# Patient Record
Sex: Female | Born: 1945 | Race: White | Hispanic: No | Smoking: Former smoker
Health system: Southern US, Community
[De-identification: ages and names within clinical notes are randomized; demographics above are authoritative.]

## PROBLEM LIST (undated history)

## (undated) DIAGNOSIS — J449 Chronic obstructive pulmonary disease, unspecified: Secondary | ICD-10-CM

## (undated) DIAGNOSIS — J189 Pneumonia, unspecified organism: Secondary | ICD-10-CM

## (undated) DIAGNOSIS — R3911 Hesitancy of micturition: Secondary | ICD-10-CM

## (undated) DIAGNOSIS — Z9981 Dependence on supplemental oxygen: Secondary | ICD-10-CM

## (undated) DIAGNOSIS — E7800 Pure hypercholesterolemia, unspecified: Secondary | ICD-10-CM

## (undated) HISTORY — PX: JOINT REPLACEMENT: SHX530

---

## 1999-01-14 ENCOUNTER — Encounter: Admission: RE | Admit: 1999-01-14 | Discharge: 1999-01-14 | Payer: Self-pay | Admitting: Cardiovascular Disease

## 1999-01-14 ENCOUNTER — Encounter: Payer: Self-pay | Admitting: Cardiovascular Disease

## 2000-10-18 ENCOUNTER — Encounter: Admission: RE | Admit: 2000-10-18 | Discharge: 2000-10-18 | Payer: Self-pay | Admitting: Cardiovascular Disease

## 2000-10-18 ENCOUNTER — Encounter: Payer: Self-pay | Admitting: Cardiovascular Disease

## 2000-11-14 ENCOUNTER — Other Ambulatory Visit: Admission: RE | Admit: 2000-11-14 | Discharge: 2000-11-14 | Payer: Self-pay | Admitting: Obstetrics and Gynecology

## 2003-09-17 ENCOUNTER — Encounter: Admission: RE | Admit: 2003-09-17 | Discharge: 2003-09-17 | Payer: Self-pay | Admitting: Cardiovascular Disease

## 2006-08-08 HISTORY — PX: CARDIAC CATHETERIZATION: SHX172

## 2006-08-25 ENCOUNTER — Ambulatory Visit (HOSPITAL_COMMUNITY): Admission: RE | Admit: 2006-08-25 | Discharge: 2006-08-25 | Payer: Self-pay | Admitting: Cardiovascular Disease

## 2009-02-27 ENCOUNTER — Encounter: Admission: RE | Admit: 2009-02-27 | Discharge: 2009-02-27 | Payer: Self-pay | Admitting: Cardiovascular Disease

## 2010-06-22 NOTE — Cardiovascular Report (Signed)
[name], Caitlin Vasquez                ACCOUNT NO.:  [id number]   MEDICAL RECORD NO.:  [id number]          PATIENT TYPE:  OIB   LOCATION:  2899                         FACILITY:  [facility]   PHYSICIAN:  Ricki Rodriguez, M.D.  DATE OF BIRTH:  05/11/1945   DATE OF PROCEDURE:  08/25/2006  DATE OF DISCHARGE:  08/25/2006                            CARDIAC CATHETERIZATION   Left heart catheterization, selective coronary angiography.   INDICATION:  This 65 year old white female with atypical chest pain,  cardiac risk factors and abnormal stress test.   APPROACH:  The right femoral artery using 4-French sheath and catheters.   COMPLICATIONS:  None.   HEMODYNAMIC DATA:  The aortic pressure was 127/62.   CORONARY ANATOMY:  1. The left main coronary artery was unremarkable.  2. Left anterior descending coronary artery:  The left anterior      descending coronary artery showed proximal 20%, mid vessel 20-30%      stenosis.  3. Diagonal vessel:  The diagonal vessels had ostial 20-30% and then      luminal irregularities of the proximal half of the vessel.  4. Left circumflex coronary artery:  The left circumflex coronary      artery also had luminal irregularities, had 30% proximal to mid      junction eccentric lesion and mid vessel 30-40% eccentric lesion.      The distal vessel was unremarkable.  5. Obtuse marginal branch one:  The obtuse marginal branch one had      luminal irregularities.  Obtuse marginal branch two and there were      very small vessels, and obtuse marginal branch four had mild      disease.  6. Right coronary artery:  The right coronary artery was codominant      with the left circumflex coronary artery and had 30% proximal, 40%      mid vessel, and 50% mid to distal junction eccentric lesion.  The      posterolateral branch and posterior descending coronary artery were      unremarkable.   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  She will get  left ventricular ejection fraction checked by echocardiogram, which was  near normal on her last echocardiogram.      Ricki Rodriguez, M.D.  Electronically Signed     ASK/MEDQ  D:  08/25/2006  T:  08/25/2006  Job:  [id number]

## 2010-06-23 ENCOUNTER — Ambulatory Visit
Admission: RE | Admit: 2010-06-23 | Discharge: 2010-06-23 | Disposition: A | Discharge status: Home / Self Care | Payer: Medicare Other | Source: Ambulatory Visit | Attending: Cardiovascular Disease | Admitting: Cardiovascular Disease

## 2010-06-23 ENCOUNTER — Other Ambulatory Visit: Payer: Self-pay | Admitting: Cardiovascular Disease

## 2010-06-23 DIAGNOSIS — R52 Pain, unspecified: Secondary | ICD-10-CM

## 2010-06-23 DIAGNOSIS — R609 Edema, unspecified: Secondary | ICD-10-CM

## 2011-05-17 ENCOUNTER — Other Ambulatory Visit: Payer: Self-pay | Admitting: Cardiovascular Disease

## 2011-05-17 ENCOUNTER — Ambulatory Visit
Admission: RE | Admit: 2011-05-17 | Discharge: 2011-05-17 | Disposition: A | Discharge status: Home / Self Care | Payer: Medicare Other | Source: Ambulatory Visit | Attending: Cardiovascular Disease | Admitting: Cardiovascular Disease

## 2011-05-17 DIAGNOSIS — R059 Cough, unspecified: Secondary | ICD-10-CM

## 2011-05-17 DIAGNOSIS — R05 Cough: Secondary | ICD-10-CM

## 2014-11-27 ENCOUNTER — Other Ambulatory Visit: Payer: Self-pay | Admitting: Cardiovascular Disease

## 2014-11-28 ENCOUNTER — Ambulatory Visit (HOSPITAL_COMMUNITY)
Admission: RE | Admit: 2014-11-28 | Discharge: 2014-11-28 | Disposition: A | Discharge status: Home / Self Care | Payer: Medicare Other | Source: Ambulatory Visit | Attending: Cardiovascular Disease | Admitting: Cardiovascular Disease

## 2014-11-28 ENCOUNTER — Encounter (HOSPITAL_COMMUNITY)
Admission: RE | Disposition: A | Discharge status: Home / Self Care | Payer: Self-pay | Source: Ambulatory Visit | Attending: Cardiovascular Disease

## 2014-11-28 DIAGNOSIS — I701 Atherosclerosis of renal artery: Secondary | ICD-10-CM | POA: Insufficient documentation

## 2014-11-28 DIAGNOSIS — Z8249 Family history of ischemic heart disease and other diseases of the circulatory system: Secondary | ICD-10-CM | POA: Insufficient documentation

## 2014-11-28 DIAGNOSIS — I509 Heart failure, unspecified: Secondary | ICD-10-CM | POA: Diagnosis not present

## 2014-11-28 DIAGNOSIS — I10 Essential (primary) hypertension: Secondary | ICD-10-CM | POA: Diagnosis present

## 2014-11-28 DIAGNOSIS — E785 Hyperlipidemia, unspecified: Secondary | ICD-10-CM | POA: Insufficient documentation

## 2014-11-28 DIAGNOSIS — R0600 Dyspnea, unspecified: Secondary | ICD-10-CM | POA: Diagnosis present

## 2014-11-28 DIAGNOSIS — R7303 Prediabetes: Secondary | ICD-10-CM | POA: Insufficient documentation

## 2014-11-28 DIAGNOSIS — R079 Chest pain, unspecified: Secondary | ICD-10-CM | POA: Diagnosis present

## 2014-11-28 DIAGNOSIS — J449 Chronic obstructive pulmonary disease, unspecified: Secondary | ICD-10-CM | POA: Insufficient documentation

## 2014-11-28 DIAGNOSIS — I25110 Atherosclerotic heart disease of native coronary artery with unstable angina pectoris: Secondary | ICD-10-CM | POA: Diagnosis not present

## 2014-11-28 DIAGNOSIS — Z7982 Long term (current) use of aspirin: Secondary | ICD-10-CM | POA: Diagnosis not present

## 2014-11-28 DIAGNOSIS — I272 Other secondary pulmonary hypertension: Secondary | ICD-10-CM | POA: Diagnosis not present

## 2014-11-28 DIAGNOSIS — F17210 Nicotine dependence, cigarettes, uncomplicated: Secondary | ICD-10-CM | POA: Diagnosis not present

## 2014-11-28 DIAGNOSIS — R0609 Other forms of dyspnea: Secondary | ICD-10-CM | POA: Diagnosis present

## 2014-11-28 DIAGNOSIS — J4489 Other specified chronic obstructive pulmonary disease: Secondary | ICD-10-CM | POA: Diagnosis present

## 2014-11-28 DIAGNOSIS — I110 Hypertensive heart disease with heart failure: Secondary | ICD-10-CM | POA: Diagnosis not present

## 2014-11-28 HISTORY — PX: CARDIAC CATHETERIZATION: SHX172

## 2014-11-28 LAB — POCT I-STAT 3, VENOUS BLOOD GAS (G3P V)
Acid-base deficit: 1 mmol/L (ref 0.0–2.0)
Bicarbonate: 23.9 mEq/L (ref 20.0–24.0)
O2 Saturation: 69 %
TCO2: 25 mmol/L (ref 0–100)
pCO2, Ven: 38.6 mmHg — ABNORMAL LOW (ref 45.0–50.0)
pH, Ven: 7.399 — ABNORMAL HIGH (ref 7.250–7.300)
pO2, Ven: 36 mmHg (ref 30.0–45.0)

## 2014-11-28 LAB — BASIC METABOLIC PANEL
Anion gap: 11 (ref 5–15)
BUN: 9 mg/dL (ref 6–20)
CO2: 24 mmol/L (ref 22–32)
Calcium: 8.8 mg/dL — ABNORMAL LOW (ref 8.9–10.3)
Chloride: 96 mmol/L — ABNORMAL LOW (ref 101–111)
Creatinine, Ser: 0.89 mg/dL (ref 0.44–1.00)
GFR calc Af Amer: >60 mL/min (ref >60)
GFR calc non Af Amer: >60 mL/min (ref >60)
Glucose, Bld: 113 mg/dL — ABNORMAL HIGH (ref 65–99)
Potassium: 4 mmol/L (ref 3.5–5.1)
Sodium: 131 mmol/L — ABNORMAL LOW (ref 135–145)

## 2014-11-28 LAB — POCT I-STAT 3, ART BLOOD GAS (G3+)
Acid-base deficit: 2 mmol/L (ref 0.0–2.0)
Bicarbonate: 22.4 mEq/L (ref 20.0–24.0)
O2 Saturation: 94 %
TCO2: 23 mmol/L (ref 0–100)
pCO2 arterial: 35 mmHg (ref 35.0–45.0)
pH, Arterial: 7.415 (ref 7.350–7.450)
pO2, Arterial: 69 mmHg — ABNORMAL LOW (ref 80.0–100.0)

## 2014-11-28 LAB — PROTIME-INR
INR: 1.03 (ref 0.00–1.49)
Prothrombin Time: 13.7 seconds (ref 11.6–15.2)

## 2014-11-28 LAB — CBC
HCT: 40 % (ref 36.0–46.0)
Hemoglobin: 13.9 g/dL (ref 12.0–15.0)
MCH: 30.2 pg (ref 26.0–34.0)
MCHC: 34.8 g/dL (ref 30.0–36.0)
MCV: 86.8 fL (ref 78.0–100.0)
Platelets: 217 10*3/uL (ref 150–400)
RBC: 4.61 MIL/uL (ref 3.87–5.11)
RDW: 13.5 % (ref 11.5–15.5)
WBC: 7.9 10*3/uL (ref 4.0–10.5)

## 2014-11-28 SURGERY — RIGHT/LEFT HEART CATH AND CORONARY ANGIOGRAPHY
Anesthesia: LOCAL

## 2014-11-28 MED ORDER — SODIUM CHLORIDE 0.9 % IV SOLN
INTRAVENOUS | Status: DC
  Administered 2014-11-28: 08:00:00 via INTRAVENOUS

## 2014-11-28 MED ORDER — SODIUM CHLORIDE 0.9 % IJ SOLN: 3.0000 mL | Freq: Two times a day (BID) | INTRAMUSCULAR | Status: DC

## 2014-11-28 MED ORDER — HYDROCODONE-ACETAMINOPHEN 5-325 MG PO TABS
ORAL_TABLET | ORAL | Status: AC
  Filled 2014-11-28: qty 1

## 2014-11-28 MED ORDER — MIDAZOLAM HCL 2 MG/2ML IJ SOLN
INTRAMUSCULAR | Status: AC
  Filled 2014-11-28: qty 4

## 2014-11-28 MED ORDER — ALBUTEROL SULFATE HFA 108 (90 BASE) MCG/ACT IN AERS: 2.0000 | INHALATION_SPRAY | Freq: Four times a day (QID) | RESPIRATORY_TRACT | Status: DC | PRN

## 2014-11-28 MED ORDER — FENTANYL CITRATE (PF) 100 MCG/2ML IJ SOLN
INTRAMUSCULAR | Status: DC | PRN
  Administered 2014-11-28: 25 ug via INTRAVENOUS

## 2014-11-28 MED ORDER — FENTANYL CITRATE (PF) 100 MCG/2ML IJ SOLN
INTRAMUSCULAR | Status: AC
  Filled 2014-11-28: qty 4

## 2014-11-28 MED ORDER — HYDROCODONE-ACETAMINOPHEN 10-325 MG PO TABS
1.0000 | ORAL_TABLET | Freq: Three times a day (TID) | ORAL | Status: DC | PRN
  Administered 2014-11-28: 1 via ORAL

## 2014-11-28 MED ORDER — IOHEXOL 350 MG/ML SOLN
INTRAVENOUS | Status: DC | PRN
  Administered 2014-11-28: 90 mL via INTRA_ARTERIAL

## 2014-11-28 MED ORDER — IRBESARTAN 150 MG PO TABS: 150.0000 mg | ORAL_TABLET | Freq: Every day | ORAL | Status: DC

## 2014-11-28 MED ORDER — SODIUM CHLORIDE 0.9 % IV SOLN: INTRAVENOUS | Status: AC

## 2014-11-28 MED ORDER — CARVEDILOL 25 MG PO TABS: 25.0000 mg | ORAL_TABLET | Freq: Two times a day (BID) | ORAL | Status: DC

## 2014-11-28 MED ORDER — SPIRONOLACTONE 12.5 MG HALF TABLET: 12.5000 mg | ORAL_TABLET | Freq: Every day | ORAL | Status: DC

## 2014-11-28 MED ORDER — TIOTROPIUM BROMIDE-OLODATEROL 2.5-2.5 MCG/ACT IN AERS: 1.0000 | INHALATION_SPRAY | Freq: Two times a day (BID) | RESPIRATORY_TRACT | Status: DC

## 2014-11-28 MED ORDER — SODIUM CHLORIDE 0.9 % IJ SOLN: 3.0000 mL | INTRAMUSCULAR | Status: DC | PRN

## 2014-11-28 MED ORDER — HYDRALAZINE HCL 20 MG/ML IJ SOLN
20.0000 mg | Freq: Once | INTRAMUSCULAR | Status: DC | PRN
  Administered 2014-11-28: 20 mg via INTRAVENOUS

## 2014-11-28 MED ORDER — ATORVASTATIN CALCIUM 20 MG PO TABS: 20.0000 mg | ORAL_TABLET | ORAL | Status: DC

## 2014-11-28 MED ORDER — HEPARIN (PORCINE) IN NACL 2-0.9 UNIT/ML-% IJ SOLN
INTRAMUSCULAR | Status: DC | PRN
  Administered 2014-11-28: 10:00:00

## 2014-11-28 MED ORDER — IPRATROPIUM BROMIDE 0.02 % IN SOLN: 0.5000 mg | Freq: Three times a day (TID) | RESPIRATORY_TRACT | Status: DC

## 2014-11-28 MED ORDER — LIDOCAINE HCL (PF) 1 % IJ SOLN
INTRAMUSCULAR | Status: DC | PRN
  Administered 2014-11-28: 20 mL

## 2014-11-28 MED ORDER — MIDAZOLAM HCL 2 MG/2ML IJ SOLN
INTRAMUSCULAR | Status: DC | PRN
  Administered 2014-11-28: 1 mg via INTRAVENOUS

## 2014-11-28 MED ORDER — HEPARIN (PORCINE) IN NACL 2-0.9 UNIT/ML-% IJ SOLN
INTRAMUSCULAR | Status: AC
  Filled 2014-11-28: qty 1000

## 2014-11-28 MED ORDER — CLONIDINE HCL 0.2 MG PO TABS: 0.2000 mg | ORAL_TABLET | Freq: Two times a day (BID) | ORAL | Status: DC

## 2014-11-28 MED ORDER — NITROGLYCERIN 1 MG/10 ML FOR IR/CATH LAB
INTRA_ARTERIAL | Status: AC
  Filled 2014-11-28: qty 10

## 2014-11-28 MED ORDER — SODIUM CHLORIDE 0.9 % IV SOLN: 250.0000 mL | INTRAVENOUS | Status: DC | PRN

## 2014-11-28 MED ORDER — AMLODIPINE BESYLATE 10 MG PO TABS: 10.0000 mg | ORAL_TABLET | Freq: Every day | ORAL | Status: DC

## 2014-11-28 MED ORDER — HYDRALAZINE HCL 20 MG/ML IJ SOLN
INTRAMUSCULAR | Status: AC
  Filled 2014-11-28: qty 1

## 2014-11-28 MED ORDER — LIDOCAINE HCL (PF) 1 % IJ SOLN
INTRAMUSCULAR | Status: AC
  Filled 2014-11-28: qty 30

## 2014-11-28 MED ORDER — ONDANSETRON HCL 4 MG/2ML IJ SOLN: 4.0000 mg | Freq: Four times a day (QID) | INTRAMUSCULAR | Status: DC | PRN

## 2014-11-28 MED ORDER — ACETAMINOPHEN 325 MG PO TABS: 650.0000 mg | ORAL_TABLET | ORAL | Status: DC | PRN

## 2014-11-28 SURGICAL SUPPLY — 12 items
CATH INFINITI 5FR MULTPACK ANG (CATHETERS) ×2 IMPLANT
CATH SWAN GANZ 7F STRAIGHT (CATHETERS) ×2 IMPLANT
KIT HEART LEFT (KITS) ×2 IMPLANT
KIT HEART RIGHT NAMIC (KITS) ×2 IMPLANT
PACK CARDIAC CATHETERIZATION (CUSTOM PROCEDURE TRAY) ×2 IMPLANT
SHEATH PINNACLE 5F 10CM (SHEATH) ×2 IMPLANT
SHEATH PINNACLE 7F 10CM (SHEATH) ×2 IMPLANT
SYR MEDRAD MARK V 150ML (SYRINGE) ×2 IMPLANT
TRANSDUCER W/STOPCOCK (MISCELLANEOUS) ×4 IMPLANT
WIRE EMERALD 3MM-J .035X150CM (WIRE) ×2 IMPLANT
WIRE EMERALD 3MM-J .035X260CM (WIRE) ×1 IMPLANT
WIRE HI TORQ VERSACORE-J 145CM (WIRE) ×1 IMPLANT

## 2014-11-28 NOTE — Discharge Instructions (Signed)
Angiogram, Care After °Refer to this sheet in the next few weeks. These instructions provide you with information about caring for yourself after your procedure. Your health care provider may also give you more specific instructions. Your treatment has been planned according to current medical practices, but problems sometimes occur. Call your health care provider if you have any problems or questions after your procedure. °WHAT TO EXPECT AFTER THE PROCEDURE °After your procedure, it is typical to have the following: °· Bruising at the catheter insertion site that usually fades within 1-2 weeks. °· Blood collecting in the tissue (hematoma) that may be painful to the touch. It should usually decrease in size and tenderness within 1-2 weeks. °HOME CARE INSTRUCTIONS °· Take medicines only as directed by your health care provider. °· You may shower 24-48 hours after the procedure or as directed by your health care provider. Remove the bandage (dressing) and gently wash the site with plain soap and water. Pat the area dry with a clean towel. Do not rub the site, because this may cause bleeding. °· Do not take baths, swim, or use a hot tub until your health care provider approves. °· Check your insertion site every day for redness, swelling, or drainage. °· Do not apply powder or lotion to the site. °· Do not lift over 10 lb (4.5 kg) for 5 days after your procedure or as directed by your health care provider. °· Ask your health care provider when it is okay to: °¨ Return to work or school. °¨ Resume usual physical activities or sports. °¨ Resume sexual activity. °· Do not drive home if you are discharged the same day as the procedure. Have someone else drive you. °· You may drive 24 hours after the procedure unless otherwise instructed by your health care provider. °· Do not operate machinery or power tools for 24 hours after the procedure or as directed by your health care provider. °· If your procedure was done as an  outpatient procedure, which means that you went home the same day as your procedure, a responsible adult should be with you for the first 24 hours after you arrive home. °· Keep all follow-up visits as directed by your health care provider. This is important. °SEEK MEDICAL CARE IF: °· You have a fever. °· You have chills. °· You have increased bleeding from the catheter insertion site. Hold pressure on the site. °SEEK IMMEDIATE MEDICAL CARE IF: °· You have unusual pain at the catheter insertion site. °· You have redness, warmth, or swelling at the catheter insertion site. °· You have drainage (other than a small amount of blood on the dressing) from the catheter insertion site. °· The catheter insertion site is bleeding, and the bleeding does not stop after 30 minutes of holding steady pressure on the site. °· The area near or just beyond the catheter insertion site becomes pale, cool, tingly, or numb. °  °This information is not intended to replace advice given to you by your health care provider. Make sure you discuss any questions you have with your health care provider. °  °Document Released: 08/12/2004 Document Revised: 02/14/2014 Document Reviewed: 06/27/2012 °Elsevier Interactive Patient Education ©2016 [employer] ° °

## 2014-11-28 NOTE — Progress Notes (Addendum)
5 and 7 fr sheaths removed from rt groin and hemostasis achieved after 20 minutes direct pressure.  Site level 0 and clear dsg applied.  Distal pulses palpatable and pt teaching with voice back given. [facility]  BP stable after Hydralazine 20mg  IV given.  Additional pressure held for 15 mins and a pressure dressing applied. Pt given Hydrocodone 1 tab PO for leg cramps.

## 2014-11-28 NOTE — H&P (Signed)
Referring Physician:  LAYANA KONKEL is an 69 y.o. female.                       Chief Complaint: Recent history of heart failure with COPD and exertional dyspnea.  HPI: 69 year old female with PMH of hypertension, smoking, dyslipidemia, CHF, CAD has increasing exertional dyspnea and occasional chest pain.  Past medical history: Borderline DM, II, + Hypertension, Smoking 1/4 th pack of cig/day x 1 yr and 1 pk/day x 43 year, dyslipidemia, no FH of premature CAD.   Past surgical history: C-sections-39 years ago. C. Cath Jun 23, 2007- mild CAD.   Family history : Mom died of old age of 1 and dad died age 66, had MI. One sister with anemia age 57. One brother died 3 weeks ago from lung condition.  Social History:  +ve tobacco use 45 + pack year, no alcohol, and drug history.  Personal: Widowed since Jun 23, 2011, husband died of pulmonary fibrosis and lung infection post knee operation.   Allergies: Lisinopril and Tribenzor.  Medications Prior to Admission  Medication Sig Dispense Refill  . albuterol (PROVENTIL HFA;VENTOLIN HFA) 108 (90 BASE) MCG/ACT inhaler Inhale 2 puffs into the lungs every 6 (six) hours as needed for wheezing or shortness of breath.    Marland Kitchen amLODipine (NORVASC) 10 MG tablet Take 10 mg by mouth daily.    Marland Kitchen aspirin EC 81 MG tablet Take 81 mg by mouth daily.    Marland Kitchen atorvastatin (LIPITOR) 20 MG tablet Take 20 mg by mouth 3 (three) times a week. On Monday, Wednesday and Friday.    . carvedilol (COREG) 25 MG tablet Take 25 mg by mouth 2 (two) times daily with a meal.    . cloNIDine (CATAPRES) 0.2 MG tablet Take 0.2 mg by mouth 2 (two) times daily.    Marland Kitchen HYDROcodone-acetaminophen (NORCO) 10-325 MG tablet Take 1 tablet by mouth every 8 (eight) hours as needed for moderate pain.    Marland Kitchen ipratropium (ATROVENT) 0.02 % nebulizer solution Take 0.5 mg by nebulization 3 (three) times daily.    Marland Kitchen spironolactone (ALDACTONE) 25 MG tablet Take 12.5 mg by mouth daily.    . Tiotropium Bromide-Olodaterol (STIOLTO  RESPIMAT) 2.5-2.5 MCG/ACT AERS Inhale 1 puff into the lungs 2 (two) times daily.    . valsartan (DIOVAN) 160 MG tablet Take 160 mg by mouth daily.      No results found for this or any previous visit (from the past 48 hour(s)). No results found.  Review Of Systems The patient denies fever, vision loss, decreased hearing, hoarseness, hemoptysis, abdominal pain, melena, hematochezia, severe indigestion/heartburn, hematuria, incontinence, genital sores, suspicious skin lesions, transient blindness, abnormal bleeding, enlarged lymph nodes, angioedema, and breast masses. She has anorexia, weight loss, chest pain, arhtritis, hypertension and mild chronic ankle edema  Blood pressure 152/64, pulse 64, temperature 97.4 F (36.3 C), temperature source Oral, resp. rate 18, height  (1.626 m), weight 51.823 kg (114 lb 4 oz), SpO2 93 %.  HEENT: Normocephalic. atrumatic. Mucous membranes pink; Hazel eyes, PERRLA; EOM intact; No scleral icterus, Nares: Patent, Oropharynx: Clear, Edentulous or Fair Dentition, Neck: FROM, no cervical lymphadenopathy nor thyromegaly or carotid bruit; no JVD; CHEST WALL: No tenderness CHEST: Clear to auscultation bilaterally with mild respiratory distress. HEART: Regular rate and rhythm; no rubs or gallops. II/VI systolic murmur. BACK: No kyphosis or scoliosis; no CVA tenderness ABDOMEN: Positive Bowel Sounds, Scaphoid, Obese, soft non-tender; no masses, no organomegaly, no pannus; no intertriginous candida. EXTREMITIES:  No bone or joint deformity; age-appropriate arthropathy of the hands and knees; no cyanosis, clubbing or edema; no ulcerations.   PULSES: 2+ and symmetric SKIN: Normal hydration no rash or ulceration CNS: Cranial nerves 2-12 grossly intact no focal neurologic deficit  Assessment/Plan Exertional dyspnea COPD Hypertension Chest pain  Right and left heart catheterization Patient understood procedure, alternatives and risks.  Ricki RodriguezKADAKIA,Jaslyne Beeck S,  MD  11/28/2014, 7:40 AM

## 2014-12-01 ENCOUNTER — Encounter (HOSPITAL_COMMUNITY): Payer: Self-pay | Admitting: Cardiovascular Disease

## 2016-01-28 DIAGNOSIS — S72002A Fracture of unspecified part of neck of left femur, initial encounter for closed fracture: Secondary | ICD-10-CM

## 2016-01-29 DIAGNOSIS — I10 Essential (primary) hypertension: Secondary | ICD-10-CM

## 2016-01-29 DIAGNOSIS — I5032 Chronic diastolic (congestive) heart failure: Secondary | ICD-10-CM

## 2016-01-29 DIAGNOSIS — I272 Pulmonary hypertension, unspecified: Secondary | ICD-10-CM

## 2016-01-29 DIAGNOSIS — J439 Emphysema, unspecified: Secondary | ICD-10-CM

## 2016-01-29 DIAGNOSIS — W19XXXA Unspecified fall, initial encounter: Secondary | ICD-10-CM

## 2016-01-29 DIAGNOSIS — E559 Vitamin D deficiency, unspecified: Secondary | ICD-10-CM

## 2016-01-29 DIAGNOSIS — E7800 Pure hypercholesterolemia, unspecified: Secondary | ICD-10-CM

## 2016-01-29 DIAGNOSIS — D62 Acute posthemorrhagic anemia: Secondary | ICD-10-CM

## 2016-01-29 DIAGNOSIS — S72002A Fracture of unspecified part of neck of left femur, initial encounter for closed fracture: Secondary | ICD-10-CM

## 2016-01-29 HISTORY — PX: TOTAL HIP ARTHROPLASTY: SHX124

## 2016-01-30 DIAGNOSIS — W19XXXA Unspecified fall, initial encounter: Secondary | ICD-10-CM | POA: Diagnosis not present

## 2016-01-30 DIAGNOSIS — I5032 Chronic diastolic (congestive) heart failure: Secondary | ICD-10-CM | POA: Diagnosis not present

## 2016-01-30 DIAGNOSIS — I272 Pulmonary hypertension, unspecified: Secondary | ICD-10-CM | POA: Diagnosis not present

## 2016-01-30 DIAGNOSIS — S72002A Fracture of unspecified part of neck of left femur, initial encounter for closed fracture: Secondary | ICD-10-CM | POA: Diagnosis not present

## 2016-01-31 DIAGNOSIS — S72002A Fracture of unspecified part of neck of left femur, initial encounter for closed fracture: Secondary | ICD-10-CM | POA: Diagnosis not present

## 2016-01-31 DIAGNOSIS — I5032 Chronic diastolic (congestive) heart failure: Secondary | ICD-10-CM | POA: Diagnosis not present

## 2016-01-31 DIAGNOSIS — I272 Pulmonary hypertension, unspecified: Secondary | ICD-10-CM | POA: Diagnosis not present

## 2016-01-31 DIAGNOSIS — W19XXXA Unspecified fall, initial encounter: Secondary | ICD-10-CM | POA: Diagnosis not present

## 2016-02-01 DIAGNOSIS — S72002A Fracture of unspecified part of neck of left femur, initial encounter for closed fracture: Secondary | ICD-10-CM | POA: Diagnosis not present

## 2016-02-01 DIAGNOSIS — I272 Pulmonary hypertension, unspecified: Secondary | ICD-10-CM | POA: Diagnosis not present

## 2016-02-01 DIAGNOSIS — I5032 Chronic diastolic (congestive) heart failure: Secondary | ICD-10-CM | POA: Diagnosis not present

## 2016-02-01 DIAGNOSIS — W19XXXA Unspecified fall, initial encounter: Secondary | ICD-10-CM | POA: Diagnosis not present

## 2016-02-02 DIAGNOSIS — I272 Pulmonary hypertension, unspecified: Secondary | ICD-10-CM | POA: Diagnosis not present

## 2016-02-02 DIAGNOSIS — I5032 Chronic diastolic (congestive) heart failure: Secondary | ICD-10-CM | POA: Diagnosis not present

## 2016-02-02 DIAGNOSIS — S72002A Fracture of unspecified part of neck of left femur, initial encounter for closed fracture: Secondary | ICD-10-CM | POA: Diagnosis not present

## 2016-02-02 DIAGNOSIS — W19XXXA Unspecified fall, initial encounter: Secondary | ICD-10-CM | POA: Diagnosis not present

## 2016-04-06 ENCOUNTER — Other Ambulatory Visit: Payer: Self-pay

## 2016-04-06 ENCOUNTER — Inpatient Hospital Stay (HOSPITAL_COMMUNITY): Payer: Medicare Other

## 2016-04-06 ENCOUNTER — Encounter (HOSPITAL_COMMUNITY): Payer: Self-pay | Admitting: General Practice

## 2016-04-06 ENCOUNTER — Inpatient Hospital Stay (HOSPITAL_COMMUNITY)
Admission: AD | Admit: 2016-04-06 | Discharge: 2016-04-15 | DRG: 252 | Disposition: A | Discharge status: Home / Self Care | Payer: Medicare Other | Source: Ambulatory Visit | Attending: Cardiovascular Disease | Admitting: Cardiovascular Disease

## 2016-04-06 DIAGNOSIS — I70202 Unspecified atherosclerosis of native arteries of extremities, left leg: Secondary | ICD-10-CM | POA: Diagnosis present

## 2016-04-06 DIAGNOSIS — L97529 Non-pressure chronic ulcer of other part of left foot with unspecified severity: Secondary | ICD-10-CM | POA: Diagnosis present

## 2016-04-06 DIAGNOSIS — R590 Localized enlarged lymph nodes: Secondary | ICD-10-CM | POA: Diagnosis present

## 2016-04-06 DIAGNOSIS — I70244 Atherosclerosis of native arteries of left leg with ulceration of heel and midfoot: Secondary | ICD-10-CM | POA: Diagnosis not present

## 2016-04-06 DIAGNOSIS — I110 Hypertensive heart disease with heart failure: Secondary | ICD-10-CM | POA: Diagnosis present

## 2016-04-06 DIAGNOSIS — I5031 Acute diastolic (congestive) heart failure: Secondary | ICD-10-CM | POA: Diagnosis present

## 2016-04-06 DIAGNOSIS — Z7982 Long term (current) use of aspirin: Secondary | ICD-10-CM

## 2016-04-06 DIAGNOSIS — E1151 Type 2 diabetes mellitus with diabetic peripheral angiopathy without gangrene: Secondary | ICD-10-CM | POA: Diagnosis present

## 2016-04-06 DIAGNOSIS — Z9981 Dependence on supplemental oxygen: Secondary | ICD-10-CM | POA: Diagnosis not present

## 2016-04-06 DIAGNOSIS — I998 Other disorder of circulatory system: Secondary | ICD-10-CM | POA: Diagnosis present

## 2016-04-06 DIAGNOSIS — I2510 Atherosclerotic heart disease of native coronary artery without angina pectoris: Secondary | ICD-10-CM | POA: Diagnosis present

## 2016-04-06 DIAGNOSIS — R918 Other nonspecific abnormal finding of lung field: Secondary | ICD-10-CM | POA: Diagnosis present

## 2016-04-06 DIAGNOSIS — I714 Abdominal aortic aneurysm, without rupture, unspecified: Secondary | ICD-10-CM

## 2016-04-06 DIAGNOSIS — E876 Hypokalemia: Secondary | ICD-10-CM | POA: Diagnosis present

## 2016-04-06 DIAGNOSIS — I5021 Acute systolic (congestive) heart failure: Secondary | ICD-10-CM | POA: Diagnosis present

## 2016-04-06 DIAGNOSIS — D62 Acute posthemorrhagic anemia: Secondary | ICD-10-CM | POA: Diagnosis not present

## 2016-04-06 DIAGNOSIS — E43 Unspecified severe protein-calorie malnutrition: Secondary | ICD-10-CM | POA: Diagnosis present

## 2016-04-06 DIAGNOSIS — E11621 Type 2 diabetes mellitus with foot ulcer: Secondary | ICD-10-CM | POA: Diagnosis present

## 2016-04-06 DIAGNOSIS — Z681 Body mass index (BMI) 19 or less, adult: Secondary | ICD-10-CM

## 2016-04-06 DIAGNOSIS — R0609 Other forms of dyspnea: Secondary | ICD-10-CM

## 2016-04-06 DIAGNOSIS — Z96642 Presence of left artificial hip joint: Secondary | ICD-10-CM | POA: Diagnosis present

## 2016-04-06 DIAGNOSIS — I708 Atherosclerosis of other arteries: Secondary | ICD-10-CM | POA: Diagnosis present

## 2016-04-06 DIAGNOSIS — L97429 Non-pressure chronic ulcer of left heel and midfoot with unspecified severity: Secondary | ICD-10-CM | POA: Diagnosis present

## 2016-04-06 DIAGNOSIS — M21372 Foot drop, left foot: Secondary | ICD-10-CM | POA: Diagnosis present

## 2016-04-06 DIAGNOSIS — Z888 Allergy status to other drugs, medicaments and biological substances status: Secondary | ICD-10-CM

## 2016-04-06 DIAGNOSIS — R627 Adult failure to thrive: Secondary | ICD-10-CM | POA: Diagnosis present

## 2016-04-06 DIAGNOSIS — E440 Moderate protein-calorie malnutrition: Secondary | ICD-10-CM | POA: Insufficient documentation

## 2016-04-06 DIAGNOSIS — I6523 Occlusion and stenosis of bilateral carotid arteries: Secondary | ICD-10-CM | POA: Diagnosis present

## 2016-04-06 DIAGNOSIS — R0602 Shortness of breath: Secondary | ICD-10-CM

## 2016-04-06 DIAGNOSIS — E785 Hyperlipidemia, unspecified: Secondary | ICD-10-CM | POA: Diagnosis present

## 2016-04-06 DIAGNOSIS — I10 Essential (primary) hypertension: Secondary | ICD-10-CM | POA: Diagnosis present

## 2016-04-06 DIAGNOSIS — Z8249 Family history of ischemic heart disease and other diseases of the circulatory system: Secondary | ICD-10-CM

## 2016-04-06 DIAGNOSIS — F17290 Nicotine dependence, other tobacco product, uncomplicated: Secondary | ICD-10-CM | POA: Diagnosis present

## 2016-04-06 DIAGNOSIS — J8410 Pulmonary fibrosis, unspecified: Secondary | ICD-10-CM | POA: Diagnosis present

## 2016-04-06 DIAGNOSIS — R0989 Other specified symptoms and signs involving the circulatory and respiratory systems: Secondary | ICD-10-CM | POA: Diagnosis not present

## 2016-04-06 DIAGNOSIS — M79609 Pain in unspecified limb: Secondary | ICD-10-CM | POA: Diagnosis not present

## 2016-04-06 DIAGNOSIS — Z79899 Other long term (current) drug therapy: Secondary | ICD-10-CM

## 2016-04-06 DIAGNOSIS — I70229 Atherosclerosis of native arteries of extremities with rest pain, unspecified extremity: Secondary | ICD-10-CM

## 2016-04-06 DIAGNOSIS — R0600 Dyspnea, unspecified: Secondary | ICD-10-CM | POA: Diagnosis present

## 2016-04-06 DIAGNOSIS — Z9889 Other specified postprocedural states: Secondary | ICD-10-CM | POA: Diagnosis not present

## 2016-04-06 DIAGNOSIS — J449 Chronic obstructive pulmonary disease, unspecified: Secondary | ICD-10-CM | POA: Diagnosis present

## 2016-04-06 DIAGNOSIS — I509 Heart failure, unspecified: Secondary | ICD-10-CM

## 2016-04-06 HISTORY — DX: Chronic obstructive pulmonary disease, unspecified: J44.9

## 2016-04-06 HISTORY — DX: Pure hypercholesterolemia, unspecified: E78.00

## 2016-04-06 HISTORY — DX: Pneumonia, unspecified organism: J18.9

## 2016-04-06 HISTORY — DX: Hesitancy of micturition: R39.11

## 2016-04-06 HISTORY — DX: Dependence on supplemental oxygen: Z99.81

## 2016-04-06 LAB — CBC WITH DIFFERENTIAL/PLATELET
Basophils Absolute: 0 10*3/uL (ref 0.0–0.1)
Basophils Relative: 0 %
Eosinophils Absolute: 0.6 10*3/uL (ref 0.0–0.7)
Eosinophils Relative: 7 %
HCT: 35.4 % — ABNORMAL LOW (ref 36.0–46.0)
Hemoglobin: 11.4 g/dL — ABNORMAL LOW (ref 12.0–15.0)
Lymphocytes Relative: 18 %
Lymphs Abs: 1.5 10*3/uL (ref 0.7–4.0)
MCH: 26.5 pg (ref 26.0–34.0)
MCHC: 32.2 g/dL (ref 30.0–36.0)
MCV: 82.1 fL (ref 78.0–100.0)
Monocytes Absolute: 0.5 10*3/uL (ref 0.1–1.0)
Monocytes Relative: 6 %
Neutro Abs: 5.9 10*3/uL (ref 1.7–7.7)
Neutrophils Relative %: 69 %
Platelets: 349 10*3/uL (ref 150–400)
RBC: 4.31 MIL/uL (ref 3.87–5.11)
RDW: 13.7 % (ref 11.5–15.5)
WBC: 8.5 10*3/uL (ref 4.0–10.5)

## 2016-04-06 LAB — COMPREHENSIVE METABOLIC PANEL
ALT: 17 U/L (ref 14–54)
AST: 21 U/L (ref 15–41)
Albumin: 4.1 g/dL (ref 3.5–5.0)
Alkaline Phosphatase: 86 U/L (ref 38–126)
Anion gap: 10 (ref 5–15)
BUN: 9 mg/dL (ref 6–20)
CO2: 29 mmol/L (ref 22–32)
Calcium: 9.5 mg/dL (ref 8.9–10.3)
Chloride: 96 mmol/L — ABNORMAL LOW (ref 101–111)
Creatinine, Ser: 0.71 mg/dL (ref 0.44–1.00)
GFR calc Af Amer: >60 mL/min (ref >60)
GFR calc non Af Amer: >60 mL/min (ref >60)
Glucose, Bld: 119 mg/dL — ABNORMAL HIGH (ref 65–99)
Potassium: 3.6 mmol/L (ref 3.5–5.1)
Sodium: 135 mmol/L (ref 135–145)
Total Bilirubin: 0.4 mg/dL (ref 0.3–1.2)
Total Protein: 7.7 g/dL (ref 6.5–8.1)

## 2016-04-06 LAB — D-DIMER, QUANTITATIVE: D-Dimer, Quant: 1.86 ug/mL-FEU — ABNORMAL HIGH (ref 0.00–0.50)

## 2016-04-06 LAB — TROPONIN I
Troponin I: <0.03 ng/mL (ref <0.03)
Troponin I: <0.03 ng/mL (ref <0.03)

## 2016-04-06 LAB — BRAIN NATRIURETIC PEPTIDE: B Natriuretic Peptide: 51.1 pg/mL (ref 0.0–100.0)

## 2016-04-06 MED ORDER — CARVEDILOL 12.5 MG PO TABS
12.5000 mg | ORAL_TABLET | Freq: Two times a day (BID) | ORAL | Status: DC
  Administered 2016-04-06 – 2016-04-15 (×17): 12.5 mg via ORAL
  Filled 2016-04-06 (×17): qty 1

## 2016-04-06 MED ORDER — ASPIRIN EC 81 MG PO TBEC
81.0000 mg | DELAYED_RELEASE_TABLET | Freq: Every day | ORAL | Status: DC
  Administered 2016-04-06 – 2016-04-15 (×9): 81 mg via ORAL
  Filled 2016-04-06 (×9): qty 1

## 2016-04-06 MED ORDER — ATORVASTATIN CALCIUM 20 MG PO TABS
20.0000 mg | ORAL_TABLET | ORAL | Status: DC
  Administered 2016-04-06 – 2016-04-13 (×4): 20 mg via ORAL
  Filled 2016-04-06 (×3): qty 1

## 2016-04-06 MED ORDER — ONDANSETRON HCL 4 MG/2ML IJ SOLN
4.0000 mg | Freq: Four times a day (QID) | INTRAMUSCULAR | Status: DC | PRN
Start: 2016-04-06 — End: 2016-04-15
  Administered 2016-04-15: 4 mg via INTRAVENOUS
  Filled 2016-04-06: qty 2

## 2016-04-06 MED ORDER — SODIUM CHLORIDE 0.9 % IV SOLN: 250.0000 mL | INTRAVENOUS | Status: DC | PRN

## 2016-04-06 MED ORDER — LEVALBUTEROL HCL 1.25 MG/0.5ML IN NEBU: 1.2500 mg | INHALATION_SOLUTION | Freq: Four times a day (QID) | RESPIRATORY_TRACT | Status: DC | PRN

## 2016-04-06 MED ORDER — SODIUM CHLORIDE 0.9% FLUSH
3.0000 mL | Freq: Two times a day (BID) | INTRAVENOUS | Status: DC
  Administered 2016-04-06 – 2016-04-11 (×10): 3 mL via INTRAVENOUS

## 2016-04-06 MED ORDER — COLLAGENASE 250 UNIT/GM EX OINT
TOPICAL_OINTMENT | Freq: Every day | CUTANEOUS | Status: DC
  Administered 2016-04-06 – 2016-04-15 (×9): via TOPICAL
  Filled 2016-04-06 (×3): qty 30

## 2016-04-06 MED ORDER — CARVEDILOL 25 MG PO TABS: 25.0000 mg | ORAL_TABLET | Freq: Two times a day (BID) | ORAL | Status: DC

## 2016-04-06 MED ORDER — LEVALBUTEROL HCL 1.25 MG/0.5ML IN NEBU
1.2500 mg | INHALATION_SOLUTION | Freq: Three times a day (TID) | RESPIRATORY_TRACT | Status: DC
  Filled 2016-04-06: qty 0.5

## 2016-04-06 MED ORDER — ALBUTEROL SULFATE (2.5 MG/3ML) 0.083% IN NEBU: 3.0000 mL | INHALATION_SOLUTION | Freq: Four times a day (QID) | RESPIRATORY_TRACT | Status: DC | PRN

## 2016-04-06 MED ORDER — AMLODIPINE BESYLATE 10 MG PO TABS
10.0000 mg | ORAL_TABLET | Freq: Every day | ORAL | Status: DC
  Administered 2016-04-07 – 2016-04-15 (×8): 10 mg via ORAL
  Filled 2016-04-06 (×8): qty 1

## 2016-04-06 MED ORDER — CLONIDINE HCL 0.2 MG PO TABS: 0.2000 mg | ORAL_TABLET | Freq: Two times a day (BID) | ORAL | Status: DC

## 2016-04-06 MED ORDER — ENSURE ENLIVE PO LIQD
237.0000 mL | Freq: Two times a day (BID) | ORAL | Status: DC
  Administered 2016-04-06 – 2016-04-07 (×2): 237 mL via ORAL

## 2016-04-06 MED ORDER — HEPARIN SODIUM (PORCINE) 5000 UNIT/ML IJ SOLN
5000.0000 [IU] | Freq: Three times a day (TID) | INTRAMUSCULAR | Status: DC
  Administered 2016-04-06 – 2016-04-10 (×10): 5000 [IU] via SUBCUTANEOUS
  Filled 2016-04-06 (×11): qty 1

## 2016-04-06 MED ORDER — FUROSEMIDE 10 MG/ML IJ SOLN
40.0000 mg | Freq: Two times a day (BID) | INTRAMUSCULAR | Status: DC
  Administered 2016-04-06 – 2016-04-07 (×2): 40 mg via INTRAVENOUS
  Filled 2016-04-06 (×2): qty 4

## 2016-04-06 MED ORDER — ACETAMINOPHEN 325 MG PO TABS
650.0000 mg | ORAL_TABLET | ORAL | Status: DC | PRN
  Administered 2016-04-06 – 2016-04-11 (×5): 650 mg via ORAL
  Filled 2016-04-06 (×4): qty 2

## 2016-04-06 MED ORDER — SODIUM CHLORIDE 0.9% FLUSH: 3.0000 mL | INTRAVENOUS | Status: DC | PRN

## 2016-04-06 MED ORDER — SPIRONOLACTONE 25 MG PO TABS
12.5000 mg | ORAL_TABLET | Freq: Every day | ORAL | Status: DC
  Administered 2016-04-07 – 2016-04-15 (×8): 12.5 mg via ORAL
  Filled 2016-04-06 (×8): qty 1

## 2016-04-06 NOTE — H&P (Signed)
Referring Physician:  [name] is an 71 y.o. female.                       Chief Complaint: Leg swelling and shortness of breath  HPI: 71 year old female with PMH of hypertension, smoking, dyslipidemia, CHF and CAD has shortness of breath at rest with leg edema and discomfort in left calf area. No fever. Positive chronic cough. She also has noticed new ulcer over left heel area.  Past medical history : DM, II, Hypertension, smoking, dyslipidemia.    Past Surgical History:  Procedure Laterality Date  . CARDIAC CATHETERIZATION N/A 11/28/2014   Procedure: Right/Left Heart Cath and Coronary Angiography;  Surgeon: [name], MD;  Location: [facility] INVASIVE CV LAB;  Service: Cardiovascular;  Laterality: N/A;    Family history: Mom died of old age of 33 years and dad died of MI age 52 yrs. She has one sister, age 79 years and had brother died 2 years ago.  Social History:  has 45 to 50 pack year smoking. No alcohol or drug use.   Personal: Widowed, husband died of pulmonary fibrosis and lung infection post knee operation and Xarelto use.  Allergies: Lisinopril and Tribenzor  Medications Prior to Admission  Medication Sig Dispense Refill  . albuterol (PROVENTIL HFA;VENTOLIN HFA) 108 (90 BASE) MCG/ACT inhaler Inhale 2 puffs into the lungs every 6 (six) hours as needed for wheezing or shortness of breath.    Marland Kitchen amLODipine (NORVASC) 10 MG tablet Take 10 mg by mouth daily.    Marland Kitchen aspirin EC 81 MG tablet Take 81 mg by mouth daily.    Marland Kitchen atorvastatin (LIPITOR) 20 MG tablet Take 20 mg by mouth 3 (three) times a week. On Monday, Wednesday and Friday.    . carvedilol (COREG) 25 MG tablet Take 25 mg by mouth 2 (two) times daily with a meal.    . cloNIDine (CATAPRES) 0.2 MG tablet Take 0.2 mg by mouth 2 (two) times daily.    Marland Kitchen HYDROcodone-acetaminophen (NORCO) 10-325 MG tablet Take 1 tablet by mouth every 8 (eight) hours as needed for moderate pain.    Marland Kitchen ipratropium (ATROVENT) 0.02 % nebulizer solution  Take 0.5 mg by nebulization 3 (three) times daily.    Marland Kitchen spironolactone (ALDACTONE) 25 MG tablet Take 12.5 mg by mouth daily.    . Tiotropium Bromide-Olodaterol (STIOLTO RESPIMAT) 2.5-2.5 MCG/ACT AERS Inhale 1 puff into the lungs 2 (two) times daily.    . valsartan (DIOVAN) 160 MG tablet Take 160 mg by mouth daily.      No results found for this or any previous visit (from the past 48 hour(s)). No results found.  Review Of Systems Constitutional: No fever, chills , positive weight loss. Eyes: No vision change, Wears glasses. No discharge or pain. Ears: Positive hearing loss, No tinnitus. Respiratory: No asthma, End stage COPD, pneumonias or shortness of breath. No hemoptysis. Cardiovascular: No chest pain, palpitation or leg edema. Gastrointestinal: No nausea, vomiting or diarrhea or constipation. No GI bleed. No hepatitis. Genitourinary: No dysuria, hematuria or kidney stone. No incontinance. Neurological: No headache, stroke or seizures.  Psychiatry: No psych facility admission for anxiety, depression or suicide. No detox. Skin: No rash. Musculoskeletal: Positive joint pain. No fibromyalgia. No neck pain or back pain. Lymphadenopathy: No lymphadenopathy Hematology: No anemia or easy bruising.   There were no vitals taken for this visit. There is no height or weight on file to calculate BMI. General appearance: alert, cooperative, appears  stated age and moderate distress Head: Normocephalic, atraumatic. Eyes: Hazel eyes, conjunctivae/corneas clear. PERRL, EOM's intact. Fundi benign. Neck: no adenopathy, no carotid bruit, + JVD, supple, symmetrical, trachea midline and thyroid not enlarged, symmetric, no tenderness/mass/nodules Resp: Few rhonchi to auscultation bilaterally Cardio: Tachycardic, regular rate and rhythm, S1, S2 normal, II/VI systolic murmur, no click, rub or gallop GI: soft, non-tender; bowel sounds normal; no masses,  no organomegaly Extremities: 2 + lower leg edema  with left calf tenderness and left heel 1.5 to 2" irregular ulcer. no cyanosis. Skin: Warm and dry.  Neurologic: Alert and oriented X 3, normal strength and tone. Normal coordination and slow gait.  Assessment/Plan Acute systolic left heart failure CAD End stage COPD Bilateral leg edema Left heel ulcer Hypertension  Admit. IV lasix. Oxygen. Echocardiogram US lower ext. R/O DVT Home medications.  Ricki RodriguezKADAKIA,Carlous Olivares S, MD  04/06/2016, 2:56 PM

## 2016-04-06 NOTE — Consult Note (Addendum)
WOC Nurse wound consult note Reason for Consult: Consult requested for left heel.  Pt states her friend has been helping take care of it prior to admission Wound type: Chronic full thickness wound, this is NOT a pressure injury Measurement: 2X.3X.1cm Wound bed: 100% yellow Drainage (amount, consistency, odor) small amt yellow drainage, no odor Periwound: dry cracked skin surrounding  Right arm with partial thickness abrasion; 1X1X.1cm, red moist wound with loose dark red peeling skin, no odor or drainage. Dressing procedure/placement/frequency: Santyl ointment to provide enzymatic debridement of nonviable tissue.  Float heel to reduce pressure.  Foam dressing to left arm to promote healing. Discussed plan of care with patient and she verbalized understanding. Please re-consult if further assistance is needed.  Thank-you,  Cammie Mcgeeawn Zaiah Credeur MSN, RN, CWOCN, [place], CNS [phone]

## 2016-04-06 NOTE — Progress Notes (Signed)
New admission received from doctor's office, pt came to the floor in stable condition. Vitals taken. Initial Assessment done. All immediate pertinent needs to patient addressed. Patient Guide given to patient. Important safety instructions relating to hospitalization reviewed with patient. Patient verbalized understanding. Will continue to monitor pt.

## 2016-04-06 NOTE — Progress Notes (Signed)
Pt educated about safety and importance of bed alarm during the night however pt refuses to be on bed alarm. Will continue to round on patient.   Naeem Quillin, RN    

## 2016-04-07 ENCOUNTER — Inpatient Hospital Stay (HOSPITAL_COMMUNITY): Payer: Medicare Other

## 2016-04-07 ENCOUNTER — Encounter (HOSPITAL_COMMUNITY): Payer: Self-pay

## 2016-04-07 DIAGNOSIS — R0989 Other specified symptoms and signs involving the circulatory and respiratory systems: Secondary | ICD-10-CM

## 2016-04-07 DIAGNOSIS — M79609 Pain in unspecified limb: Secondary | ICD-10-CM

## 2016-04-07 DIAGNOSIS — I70244 Atherosclerosis of native arteries of left leg with ulceration of heel and midfoot: Secondary | ICD-10-CM

## 2016-04-07 LAB — TROPONIN I: Troponin I: <0.03 ng/mL (ref <0.03)

## 2016-04-07 LAB — BASIC METABOLIC PANEL
Anion gap: 8 (ref 5–15)
BUN: 14 mg/dL (ref 6–20)
CO2: 31 mmol/L (ref 22–32)
Calcium: 9.1 mg/dL (ref 8.9–10.3)
Chloride: 98 mmol/L — ABNORMAL LOW (ref 101–111)
Creatinine, Ser: 0.8 mg/dL (ref 0.44–1.00)
GFR calc Af Amer: >60 mL/min (ref >60)
GFR calc non Af Amer: >60 mL/min (ref >60)
Glucose, Bld: 107 mg/dL — ABNORMAL HIGH (ref 65–99)
Potassium: 3.4 mmol/L — ABNORMAL LOW (ref 3.5–5.1)
Sodium: 137 mmol/L (ref 135–145)

## 2016-04-07 LAB — ECHOCARDIOGRAM COMPLETE
Height: 64 in
Weight: 1702.4 oz

## 2016-04-07 MED ORDER — IOPAMIDOL (ISOVUE-370) INJECTION 76%
INTRAVENOUS | Status: AC
  Administered 2016-04-07: 100 mL
  Filled 2016-04-07: qty 100

## 2016-04-07 MED ORDER — ENSURE ENLIVE PO LIQD
237.0000 mL | Freq: Two times a day (BID) | ORAL | Status: DC
  Administered 2016-04-09 – 2016-04-15 (×9): 237 mL via ORAL

## 2016-04-07 MED ORDER — HYDROCODONE-ACETAMINOPHEN 5-325 MG PO TABS
1.0000 | ORAL_TABLET | Freq: Four times a day (QID) | ORAL | Status: DC | PRN
  Administered 2016-04-07 – 2016-04-11 (×15): 1 via ORAL
  Filled 2016-04-07 (×15): qty 1

## 2016-04-07 MED ORDER — POTASSIUM CHLORIDE CRYS ER 10 MEQ PO TBCR
10.0000 meq | EXTENDED_RELEASE_TABLET | Freq: Two times a day (BID) | ORAL | Status: AC
  Administered 2016-04-07 – 2016-04-09 (×5): 10 meq via ORAL
  Filled 2016-04-07 (×6): qty 1

## 2016-04-07 MED ORDER — ENSURE ENLIVE PO LIQD: 237.0000 mL | Freq: Three times a day (TID) | ORAL | Status: DC

## 2016-04-07 MED ORDER — HYDROMORPHONE HCL 1 MG/ML IJ SOLN
0.5000 mg | Freq: Once | INTRAMUSCULAR | Status: AC
  Administered 2016-04-07: 0.5 mg via INTRAVENOUS
  Filled 2016-04-07: qty 1

## 2016-04-07 MED ORDER — ORAL CARE MOUTH RINSE
15.0000 mL | Freq: Two times a day (BID) | OROMUCOSAL | Status: DC
  Administered 2016-04-07 – 2016-04-15 (×13): 15 mL via OROMUCOSAL

## 2016-04-07 NOTE — Progress Notes (Signed)
Plan for Npo, artery Procedure in the am

## 2016-04-07 NOTE — Progress Notes (Signed)
Pt continues to refuse bed alarm. Will keep monitor.  Woodford Strege, RN

## 2016-04-07 NOTE — Progress Notes (Signed)
VASCULAR LAB PRELIMINARY  PRELIMINARY  PRELIMINARY  PRELIMINARY  Bilateral lower extremity venous duplex completed.    Preliminary report:  There is no DVT or SVT noted in the bilateral lower extremities.  Incidentally, there seems to be no significant arterial flow throughout the left lower extremity.   Namon Villarin, RVT 04/07/2016, 12:32 PM

## 2016-04-07 NOTE — Care Management Note (Signed)
Case Management Note  Patient Details  Name: Caitlin Vasquez J [place] MRN: [id number] Date of Birth: 04-24-1945  Subjective/Objective:     Admitted with CHF               Action/Plan: PCP is Dr Algie CofferKadakia; has private insurance with Medicare; has home 02; CM following for DCP  Expected Discharge Date:    possibly 04/11/2016              Expected Discharge Plan:  Home/Self Care  Discharge planning Services  CM Consult  Status of Service:  In process, will continue to follow  Reola MosherChandler, Emiah Pellicano L, RN,MHA,BSN [phone] 04/07/2016, 3:50 PM

## 2016-04-07 NOTE — Progress Notes (Signed)
Patient alert and oriented, slept mostly during the night. Complained of heel pain, pain medications given and effective. Will continue to monitor.  Caitlin Vasquez

## 2016-04-07 NOTE — Progress Notes (Signed)
VASCULAR LAB PRELIMINARY  ARTERIAL  ABI completed:Right ABI indicates borderline severe reduction in arterial flow.  Left ABI indicates severe reduction in arterial blood flow.      RIGHT    LEFT    PRESSURE WAVEFORM  PRESSURE WAVEFORM  BRACHIAL 2 IVs T BRACHIAL 148 T  DP   DP    AT 83 DM AT 40 SDM,   PT 85 DM PT  absent  PER   PER    GREAT TOE  NA GREAT TOE  NA    RIGHT LEFT  ABI .57 .27     Alyxandra Tenbrink, RVT 04/07/2016, 12:58 PM

## 2016-04-07 NOTE — Progress Notes (Signed)
Pt in pain in her left foot, MD ordered hydrocodone 1 tablet Q6 hours as needed.

## 2016-04-07 NOTE — Progress Notes (Signed)
Pt currently in vascular for Doppler and ECHO.

## 2016-04-07 NOTE — Progress Notes (Signed)
Ref: Ricki Rodriguez, MD   Subjective:  Shortness of breath continues. CT of chest is negative for PE. Afebrile.  Objective:  Vital Signs in the last 24 hours: Temp:  [97.5 F (36.4 C)-98.1 F (36.7 C)] 97.6 F (36.4 C) (03/01 0800) Pulse Rate:  [87-100] 87 (03/01 0800) Cardiac Rhythm: Normal sinus rhythm (03/01 0700) Resp:  [18] 18 (03/01 0800) BP: (128-147)/(62-72) 135/67 (03/01 0800) SpO2:  [96 %-100 %] 98 % (03/01 0535) Weight:  [48.3 kg (106 lb 6.4 oz)-48.6 kg (107 lb 1.6 oz)] 48.3 kg (106 lb 6.4 oz) (03/01 0535)  Physical Exam: BP Readings from Last 1 Encounters:  04/07/16 135/67    Wt Readings from Last 1 Encounters:  04/07/16 48.3 kg (106 lb 6.4 oz)    Weight change:  Body mass index is 18.26 kg/m. HEENT: [place]/AT, Eyes-Hazel, PERL, EOMI, Conjunctiva-Pink, Sclera-Non-icteric Neck: No JVD, No bruit, Trachea midline. Lungs:  Clearing, Bilateral. Cardiac:  Regular rhythm, normal S1 and S2, no S3. II/VI systolic murmur. Abdomen:  Soft, non-tender. BS present. Extremities:  1 + lower leg/ankle edema present. No cyanosis. No clubbing. CNS: AxOx3, Cranial nerves grossly intact, moves all 4 extremities.  Skin: Warm and dry.   Intake/Output from previous day: 02/28 0701 - 03/01 0700 In: 650 [P.O.:650] Out: 800 [Urine:800]    Lab Results: BMET    Component Value Date/Time   NA 137 04/07/2016 0331   NA 135 04/06/2016 1456   NA 131 (L) 11/28/2014 0805   K 3.4 (L) 04/07/2016 0331   K 3.6 04/06/2016 1456   K 4.0 11/28/2014 0805   CL 98 (L) 04/07/2016 0331   CL 96 (L) 04/06/2016 1456   CL 96 (L) 11/28/2014 0805   CO2 31 04/07/2016 0331   CO2 29 04/06/2016 1456   CO2 24 11/28/2014 0805   GLUCOSE 107 (H) 04/07/2016 0331   GLUCOSE 119 (H) 04/06/2016 1456   GLUCOSE 113 (H) 11/28/2014 0805   BUN 14 04/07/2016 0331   BUN 9 04/06/2016 1456   BUN 9 11/28/2014 0805   CREATININE 0.80 04/07/2016 0331   CREATININE 0.71 04/06/2016 1456   CREATININE 0.89 11/28/2014 0805    CALCIUM 9.1 04/07/2016 0331   CALCIUM 9.5 04/06/2016 1456   CALCIUM 8.8 (L) 11/28/2014 0805   GFRNONAA >60 04/07/2016 0331   GFRNONAA >60 04/06/2016 1456   GFRNONAA >60 11/28/2014 0805   GFRAA >60 04/07/2016 0331   GFRAA >60 04/06/2016 1456   GFRAA >60 11/28/2014 0805   CBC    Component Value Date/Time   WBC 8.5 04/06/2016 1456   RBC 4.31 04/06/2016 1456   HGB 11.4 (L) 04/06/2016 1456   HCT 35.4 (L) 04/06/2016 1456   PLT 349 04/06/2016 1456   MCV 82.1 04/06/2016 1456   MCH 26.5 04/06/2016 1456   MCHC 32.2 04/06/2016 1456   RDW 13.7 04/06/2016 1456   LYMPHSABS 1.5 04/06/2016 1456   MONOABS 0.5 04/06/2016 1456   EOSABS 0.6 04/06/2016 1456   BASOSABS 0.0 04/06/2016 1456   HEPATIC Function Panel  Recent Labs  04/06/16 1456  PROT 7.7   HEMOGLOBIN A1C No components found for: HGA1C,  MPG CARDIAC ENZYMES Lab Results  Component Value Date   TROPONINI <0.03 04/07/2016   TROPONINI <0.03 04/06/2016   TROPONINI <0.03 04/06/2016   BNP No results for input(s): PROBNP in the last 8760 hours. TSH No results for input(s): TSH in the last 8760 hours. CHOLESTEROL No results for input(s): CHOL in the last 8760 hours.  Scheduled Meds: .  amLODipine  10 mg Oral Daily  . aspirin EC  81 mg Oral Daily  . atorvastatin  20 mg Oral Once per day on Mon Wed Fri  . carvedilol  12.5 mg Oral BID WC  . collagenase   Topical Daily  . feeding supplement (ENSURE ENLIVE)  237 mL Oral BID BM  . heparin  5,000 Units Subcutaneous Q8H  . mouth rinse  15 mL Mouth Rinse BID  . potassium chloride  10 mEq Oral BID  . sodium chloride flush  3 mL Intravenous Q12H  . spironolactone  12.5 mg Oral Daily   Continuous Infusions: PRN Meds:.sodium chloride, acetaminophen, levalbuterol, ondansetron (ZOFRAN) IV, sodium chloride flush  Assessment/Plan: COPD with pulmonary fibrosis Bilateral leg edema Systolic heart failure less likely. Left heel ulcer Hypertension Moderate protein calorie  malnutrition CAD  Awaiting US for DVT and Echocardiogram for LV function. Increase activity as tolerated. Appreciate wound care consult.   LOS: 1 day    Orpah CobbAjay Gita Dilger  MD  04/07/2016, 9:04 AM

## 2016-04-07 NOTE — Progress Notes (Signed)
Initial Nutrition Assessment  DOCUMENTATION CODES:   Underweight, Non-severe (moderate) malnutrition in context of chronic illness  INTERVENTION:  - Continue Ensure Enlive BID, each supplement provides 350 calories and 20 grams protein - Add Magic Cup q 24 hrs, each supplement provides 290 calories and 9 grams protein - Recommend liberalization of diet to increase food options as pt is malnourished  NUTRITION DIAGNOSIS:   Malnutrition related to chronic illness as evidenced by moderate depletion of body fat, severe depletion of muscle mass, mild fluid accumulation.  GOAL:   Patient will meet greater than or equal to 90% of their needs  MONITOR:   PO intake, Supplement acceptance, I & O's, Weight trends  REASON FOR ASSESSMENT:   Malnutrition Screening Tool    ASSESSMENT:   Pt with PMH of COPD, HTN, on 2 L home O2, dyslipidemia, CHF and CAD has SOB at rest with leg edema and discomfort in left calf area. Positive chronic cough.   Pt reports recent weight loss, ~15 lb in 1 year (not significant for time frame). Per chart review, pt has no recent weight values charted.  Pt reports a decrease in appetite. Per chart review, pt has consumed 0-75% of meals this admission. PTA pt states she consumes 2 Ensure/day and small frequent snacks that could include crackers but did not specify further.   Labs reviewed; K (3.4) Medications reviewed; potassium chloride  Nutrition-focused physical exam completed. Findings are mild to moderate fat depletion, mild to severe muscle depletion, and mild edema.  Diet Order:  Diet Heart Room service appropriate? Yes; Fluid consistency: Thin; Fluid restriction: 1200 mL Fluid Diet NPO time specified Except for: Sips with Meds  Skin:  Wound (see comment) (non-pressure to heel, Stg II to buttocks)  Last BM:  2/28  Height:   Ht Readings from Last 1 Encounters:  04/06/16 5\' 4"  (1.626 m)    Weight:   Wt Readings from Last 1 Encounters:   04/07/16 106 lb 6.4 oz (48.3 kg)    Ideal Body Weight:  54.5 kg  BMI:  Body mass index is 18.26 kg/m.  Estimated Nutritional Needs:   Kcal:  1450-1650  Protein:  75-85 grams   Fluid:  < 1.2 L/d  EDUCATION NEEDS:   Education needs no appropriate at this time  [employer] Ioannides Dietetic Intern

## 2016-04-07 NOTE — Consult Note (Signed)
Hospital Consult    Reason for Consult:  Left heel wound  Referring Physician:  Dr. Algie CofferKadakia MRN #:  [id number]  History of Present Illness: This is a 71 y.o. female who presented to her cardiologist office yesterday with c/o leg swelling and shortness of breath.  She is on 2 L O2 [place] continuously 24 hours a day.  She states that she used to smoke about 1-1.5 ppd from age 71 to [age] but now smokes e-cigarettes.    She states she has a wound on her left heel that came up last Friday.  Her friend has been coming over and tending to this wound.  She states she has not had any pain in her leg.  She states that it has been weaker since she broke her hip in December at which time she had surgery.  She has not had any wounds prior to this.  See denies any intermittent claudication or rest pain.  She denies diabetes.  She does have hypertension and states it is under pretty good control and is on a CCB, ARB and spironolactone.  She is not on a statin.    She denies any chest pain/pressure.  She denies hx of stroke.    Past Medical History:  Diagnosis Date  . COPD (chronic obstructive pulmonary disease) (HCC)   . High cholesterol   . On home oxygen therapy    "2L; all the time" (04/06/2016)  . Pneumonia    "when I was a kid"   . Urinary hesitancy     Past Surgical History:  Procedure Laterality Date  . CARDIAC CATHETERIZATION N/A 11/28/2014   Procedure: Right/Left Heart Cath and Coronary Angiography;  Surgeon: Orpah CobbAjay Kadakia, MD;  Location: [facility];  Service: Cardiovascular;  Laterality: N/A;  . CARDIAC CATHETERIZATION  08/2006   Hattie Perch/notes 08/25/2006  . JOINT REPLACEMENT    . TOTAL HIP ARTHROPLASTY Left 01/29/2016    Allergies  Allergen Reactions  . Clonidine Derivatives Other (See Comments)    hypotension    Prior to Admission medications   Medication Sig Start Date End Date Taking? Authorizing Provider  albuterol (PROVENTIL HFA;VENTOLIN HFA) 108 (90 BASE) MCG/ACT inhaler  Inhale 2 puffs into the lungs every 6 (six) hours as needed for wheezing or shortness of breath.   Yes Historical Provider, MD  ALPRAZolam Prudy Feeler(XANAX) 0.25 MG tablet Take 0.25 mg by mouth at bedtime as needed for anxiety.   Yes Historical Provider, MD  amLODipine (NORVASC) 10 MG tablet Take 10 mg by mouth daily.   Yes Historical Provider, MD  CALCIUM PO Take 1 tablet by mouth daily.   Yes Historical Provider, MD  ipratropium (ATROVENT) 0.02 % nebulizer solution Take 0.5 mg by nebulization every 6 (six) hours as needed for shortness of breath.    Yes Historical Provider, MD  losartan (COZAAR) 100 MG tablet Take 100 mg by mouth daily.   Yes Historical Provider, MD  oxyCODONE (OXY IR/ROXICODONE) 5 MG immediate release tablet Take 5-10 mg by mouth every 6 (six) hours as needed for severe pain.   Yes Historical Provider, MD  spironolactone (ALDACTONE) 25 MG tablet Take 12.5 mg by mouth daily.   Yes Historical Provider, MD  Tiotropium Bromide-Olodaterol (STIOLTO RESPIMAT) 2.5-2.5 MCG/ACT AERS Inhale 2 puffs into the lungs daily.    Yes Historical Provider, MD  Vitamin D, Ergocalciferol, (DRISDOL) 50000 units CAPS capsule Take 50,000 Units by mouth every 7 (seven) days.   Yes Historical Provider, MD    Social  History   Social History  . Marital status: Widowed    Spouse name: N/A  . Number of children: N/A  . Years of education: N/A   Occupational History  . Not on file.   Social History Main Topics  . Smoking status: Former Smoker    Packs/day: 1.00    Years: 55.00    Types: Cigarettes    Quit date: 2016  . Smokeless tobacco: Never Used  . Alcohol use No  . Drug use: No  . Sexual activity: No   Other Topics Concern  . Not on file   Social History Narrative  . No narrative on file     Family History  Problem Relation Age of Onset  . Heart attack Father     ROS: [x]  Positive   [ ]  Negative   [ ]  All sytems reviewed and are negative  Cardiovascular: []  chest pain/pressure []   palpitations [x]  SOB on home O2 continuously  []  DOE []  pain in legs while walking []  pain in legs at rest []  pain in legs at night []  non-healing ulcers []  hx of DVT [x]  swelling in legs  Pulmonary: []  productive cough []  asthma/wheezing [x]  home O2 [x]  COPD  Neurologic: []  weakness in []  arms []  legs []  numbness in []  arms []  legs []  hx of CVA []  mini stroke [] difficulty speaking or slurred speech []  temporary loss of vision in one eye []  dizziness  Hematologic: []  hx of cancer []  bleeding problems []  problems with blood clotting easily  Endocrine:   []  diabetes []  thyroid disease  GI []  vomiting blood []  blood in stool  GU: []  CKD/renal failure []  HD--[]  M/W/F or []  T/T/S []  burning with urination []  blood in urine [x]  urinary hesitancy   Psychiatric: []  anxiety []  depression  Musculoskeletal: []  arthritis [x]  joint pain [x]  hx broken hip in December with surgery  Integumentary: []  rashes []  ulcers  Constitutional: []  fever []  chills   Physical Examination  Vitals:   04/07/16 0800 04/07/16 1040  BP: 135/67 (!) 142/72  Pulse: 87 93  Resp: 18 18  Temp: 97.6 F (36.4 C) 97.7 F (36.5 C)   Body mass index is 18.26 kg/m.  General:  WDWN in NAD Gait: Not observed HENT: WNL, normocephalic Pulmonary: normal non-labored breathing, without Rales, rhonchi,  wheezing Cardiac: regular, without  Murmurs, rubs or gallops; with carotid bruits Abdomen:  soft, NT/ND, no masses Skin: petechial rash on R forearm, L hip incision incompletely healed, some residual scabs, No infection present.  Vascular Exam/Pulses:  Right Left  Radial 2+ (normal) 2+ (normal)  Ulnar 2+ (normal) 2+ (normal)  Femoral 2+ (normal) absent  Popliteal Unable to palpate  Unable to palpate   DP Unable to palpate  Unable to palpate   PT Unable to palpate  Unable to palpate    Extremities: wound left lateral heel with fibrinous tissue wound bed measuring 2 cm x 3 cm;  mild swelling LLE Musculoskeletal: motor 5/5 throughout, no muscle wasting or atrophy, asx tibia with evidence of L tibia bony hypertrophy Neurologic: A&O X 3;  No focal weakness or paresthesias are detected; speech is fluent/normal, intact L foot sensation though less than R Psychiatric:  The pt has Normal affect, judgement in tact Lymph:  No inguinal lymphadenopathy, mild cervical LAD   CBC    Component Value Date/Time   WBC 8.5 04/06/2016 1456   RBC 4.31 04/06/2016 1456   HGB 11.4 (L) 04/06/2016 1456   HCT 35.4 (  L) 04/06/2016 1456   PLT 349 04/06/2016 1456   MCV 82.1 04/06/2016 1456   MCH 26.5 04/06/2016 1456   MCHC 32.2 04/06/2016 1456   RDW 13.7 04/06/2016 1456   LYMPHSABS 1.5 04/06/2016 1456   MONOABS 0.5 04/06/2016 1456   EOSABS 0.6 04/06/2016 1456   BASOSABS 0.0 04/06/2016 1456    BMET    Component Value Date/Time   NA 137 04/07/2016 0331   K 3.4 (L) 04/07/2016 0331   CL 98 (L) 04/07/2016 0331   CO2 31 04/07/2016 0331   GLUCOSE 107 (H) 04/07/2016 0331   BUN 14 04/07/2016 0331   CREATININE 0.80 04/07/2016 0331   CALCIUM 9.1 04/07/2016 0331   GFRNONAA >60 04/07/2016 0331   GFRAA >60 04/07/2016 0331    COAGS: Lab Results  Component Value Date   INR 1.03 11/28/2014     Non-Invasive Vascular Imaging:   Venous Duplex 04/07/16: Preliminary report:  There is no DVT or SVT noted in the bilateral lower extremities.  Incidentally, there seems to be no significant arterial flow throughout the left lower extremity.  ABI's 04/07/16:  RIGHT    LEFT    PRESSURE WAVEFORM  PRESSURE WAVEFORM  BRACHIAL 2 IVs T BRACHIAL 148 T  DP   DP    AT 83 DM AT 40 SDM,   PT 85 DM PT  absent  PER   PER    GREAT TOE  NA GREAT TOE  NA    RIGHT LEFT  ABI .57 .27   2D echocardiogram 04/07/16: Study Conclusions  - Left ventricle: The cavity size was normal. There was mild   concentric hypertrophy. Systolic function was normal. Wall motion   was normal; there were  no regional wall motion abnormalities.   Doppler parameters are consistent with abnormal left ventricular   relaxation (grade 1 diastolic dysfunction). - Left atrium: The atrium was mildly dilated. - Right ventricle: There was mild hypertrophy.  Statin:  Yes.   Beta Blocker:  Yes Aspirin:  Yes.   ACEI:  No. ARB:  No. CCB use:  Yes Other antiplatelets/anticoagulants:  Yes.   SQ heparin   Laboratory: CBC:    Component Value Date/Time   WBC 8.5 04/06/2016 1456   RBC 4.31 04/06/2016 1456   HGB 11.4 (L) 04/06/2016 1456   HCT 35.4 (L) 04/06/2016 1456   PLT 349 04/06/2016 1456   MCV 82.1 04/06/2016 1456   MCH 26.5 04/06/2016 1456   MCHC 32.2 04/06/2016 1456   RDW 13.7 04/06/2016 1456   LYMPHSABS 1.5 04/06/2016 1456   MONOABS 0.5 04/06/2016 1456   EOSABS 0.6 04/06/2016 1456   BASOSABS 0.0 04/06/2016 1456    BMP:    Component Value Date/Time   NA 137 04/07/2016 0331   K 3.4 (L) 04/07/2016 0331   CL 98 (L) 04/07/2016 0331   CO2 31 04/07/2016 0331   GLUCOSE 107 (H) 04/07/2016 0331   BUN 14 04/07/2016 0331   CREATININE 0.80 04/07/2016 0331   CALCIUM 9.1 04/07/2016 0331   GFRNONAA >60 04/07/2016 0331   GFRAA >60 04/07/2016 0331    Coagulation: Lab Results  Component Value Date   INR 1.03 11/28/2014   No results found for: PTT  Lipids: No results found for: CHOL, TRIG, HDL, CHOLHDL, VLDL, LDLCALC, LDLDIRECT   Radiology: Dg Chest 2 View  Result Date: 04/06/2016 CLINICAL DATA:  Congestive heart failure, dyspnea EXAM: CHEST  2 VIEW COMPARISON:  01/28/2016 FINDINGS: The heart size is normal. Stable aortic atherosclerosis without aneurysm.  Fine interstitial lung markings consistent with interstitial fibrosis bilaterally. No pulmonary consolidation, effusion or pneumothorax. No overt pulmonary edema. Chronic compression deformity of mid and lower thoracic vertebrae as well as osteoarthritis of the shoulders. IMPRESSION: No active cardiopulmonary disease. Fine interstitial  prominence of the lungs bilaterally consistent with fibrosis. Electronically Signed   By: Tollie Eth M.D.   On: 04/06/2016 18:46   Ct Angio Chest Pe W Or Wo Contrast  Result Date: 04/07/2016 CLINICAL DATA:  Shortness of breath at rest. EXAM: CT ANGIOGRAPHY CHEST WITH CONTRAST TECHNIQUE: Multidetector CT imaging of the chest was performed using the standard protocol during bolus administration of intravenous contrast. Multiplanar CT image reconstructions and MIPs were obtained to evaluate the vascular anatomy. CONTRAST:  100 cc Isovue 370. COMPARISON:  PA and lateral chest 04/06/2016. FINDINGS: Cardiovascular: No pulmonary embolus is identified. Heart size is normal. There is calcific aortic and coronary atherosclerosis. No pericardial effusion. Mediastinum/Nodes: The thyroid appears normal. No hiatal hernia. A few small mediastinal lymph nodes are identified including a precarinal node on image 42 measuring 0.9 cm short axis dimension and a right hilar node on image 51 measuring 1.3 cm short axis dimension. Lungs/Pleura: No pleural effusion. Severe centrilobular emphysema is seen. Mild dependent atelectasis is noted. A right lower lobe nodule on image 68 measures 0.7 cm in diameter. A second nodule in the right lower lobe on image 91 measures 0.4 cm measures 0.4 cm. Upper Abdomen: Partial visualization of a cyst in the upper pole the right kidney is identified. No acute abnormality. Musculoskeletal: Remote T11 and T12 compression fractures are seen. No lytic or sclerotic lesion. Review of the MIP images confirms the above findings. IMPRESSION: Negative for pulmonary embolus. Severe centrilobular emphysema. Two right lower lobe pulmonary nodules are identified. The larger measures 0.7 cm. Non-contrast chest CT at 3-6 months is recommended. If nodules persist, subsequent management will be based upon the most suspicious nodule(s). This recommendation follows the consensus statement: Guidelines for Management of  Incidental Pulmonary Nodules Detected on CT Images: From the Fleischner Society 2017; Radiology 2017; 284:228-243. Small mediastinal lymph nodes are likely reactive. Calcific aortic and coronary atherosclerosis. Electronically Signed   By: Drusilla Kanner M.D.   On: 04/07/2016 07:28    ASSESSMENT/PLAN: This is a 71 y.o. female with critical limb ischemia L leg in form of heel ulcer, R leg moderate peripheral arterial disease, known multivessel CAD, COPD   -pt has absent left femoral pulse suggesting iliac disease/occlusion.  Will plan for arteriogram on Friday by Dr. Darrick Penna with possible intervention. (normal renal function).  She has an easily palpable right femoral pulse. -continue to float heels to avoid pressure on her feet. -continue current wound care (Santyl to wound daily) -she does have a right carotid bruit and will order carotid duplex. -stressed the importance of smoking cessation -was not on a statin prior to admission and has been started on one -npo after MN/consent ordered   Doreatha Massed, PA-C [facility] [phone]  Addendum  I have independently interviewed and examined the patient, and I agree with the physician assistant's findings.  Pt has NO evidence of threatened limb at this point with intact motor and sensation in L leg and dopplerable AT on ABI.  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  Will need: aortogram, BRo, possible L leg intervention.  Dr. Darrick Penna will perform this procedure for the patient as I  am in clinic all day tomorrow.   I discussed with the patient the nature of angiographic procedures, especially the limited patencies of any endovascular intervention.    The patient is aware of that the risks of an angiographic procedure include but are not limited to: bleeding, infection, access site complications, renal failure, embolization, rupture of vessel, dissection, arteriovenous  fistula, possible need for emergent surgical intervention, possible need for surgical procedures to treat the patient's pathology, anaphylactic reaction to contrast, and stroke and death.    The patient is aware of the risks and agrees to proceed.  Would also get B GSV mapping in event bypass is needed  Risk stratification and preop optimization per Dr. Algie Coffer   Leonides Sake, MD, FACS [facility]: [phone] Pager: [phone]  04/07/2016, 4:05 PM

## 2016-04-07 NOTE — Progress Notes (Signed)
Patient complained that Tylenol is not helping. Paged MD on call. Hydromorphone 0.5 mg ordered one time and given. Will continue to monitor.  Kyland No, RN

## 2016-04-07 NOTE — Progress Notes (Signed)
  Echocardiogram 2D Echocardiogram has been performed.  Caitlin Vasquez, Caitlin Vasquez M 04/07/2016, 1:44 PM

## 2016-04-08 ENCOUNTER — Inpatient Hospital Stay (HOSPITAL_COMMUNITY): Payer: Medicare Other

## 2016-04-08 ENCOUNTER — Encounter (HOSPITAL_COMMUNITY): Payer: Self-pay | Admitting: Vascular Surgery

## 2016-04-08 ENCOUNTER — Encounter (HOSPITAL_COMMUNITY)
Admission: AD | Disposition: A | Discharge status: Home / Self Care | Payer: Self-pay | Source: Ambulatory Visit | Attending: Cardiovascular Disease

## 2016-04-08 DIAGNOSIS — I998 Other disorder of circulatory system: Secondary | ICD-10-CM

## 2016-04-08 DIAGNOSIS — R0989 Other specified symptoms and signs involving the circulatory and respiratory systems: Secondary | ICD-10-CM

## 2016-04-08 HISTORY — PX: ABDOMINAL AORTOGRAM: CATH118222

## 2016-04-08 HISTORY — PX: LOWER EXTREMITY ANGIOGRAPHY: CATH118251

## 2016-04-08 HISTORY — PX: PERIPHERAL VASCULAR INTERVENTION: CATH118257

## 2016-04-08 LAB — BASIC METABOLIC PANEL
Anion gap: 13 (ref 5–15)
BUN: 15 mg/dL (ref 6–20)
CO2: 28 mmol/L (ref 22–32)
Calcium: 9.4 mg/dL (ref 8.9–10.3)
Chloride: 95 mmol/L — ABNORMAL LOW (ref 101–111)
Creatinine, Ser: 0.73 mg/dL (ref 0.44–1.00)
GFR calc Af Amer: >60 mL/min (ref >60)
GFR calc non Af Amer: >60 mL/min (ref >60)
Glucose, Bld: 132 mg/dL — ABNORMAL HIGH (ref 65–99)
Potassium: 3.3 mmol/L — ABNORMAL LOW (ref 3.5–5.1)
Sodium: 136 mmol/L (ref 135–145)

## 2016-04-08 LAB — POCT ACTIVATED CLOTTING TIME
Activated Clotting Time: 252 seconds
Activated Clotting Time: 202 seconds
Activated Clotting Time: 175 seconds

## 2016-04-08 SURGERY — ABDOMINAL AORTOGRAM

## 2016-04-08 MED ORDER — MAGNESIUM SULFATE 2 GM/50ML IV SOLN
2.0000 g | Freq: Every day | INTRAVENOUS | Status: DC | PRN
  Filled 2016-04-08: qty 50

## 2016-04-08 MED ORDER — SODIUM CHLORIDE 0.9 % IV SOLN: INTRAVENOUS | Status: DC

## 2016-04-08 MED ORDER — IODIXANOL 320 MG/ML IV SOLN
INTRAVENOUS | Status: DC | PRN
  Administered 2016-04-08: 220 mL via INTRAVENOUS

## 2016-04-08 MED ORDER — SODIUM CHLORIDE 0.45 % IV SOLN
INTRAVENOUS | Status: DC
  Administered 2016-04-08: 12:00:00 via INTRAVENOUS

## 2016-04-08 MED ORDER — HEPARIN SODIUM (PORCINE) 1000 UNIT/ML IJ SOLN
INTRAMUSCULAR | Status: DC | PRN
  Administered 2016-04-08: 5000 [IU] via INTRAVENOUS

## 2016-04-08 MED ORDER — LIDOCAINE HCL (PF) 1 % IJ SOLN
INTRAMUSCULAR | Status: AC
  Filled 2016-04-08: qty 30

## 2016-04-08 MED ORDER — FENTANYL CITRATE (PF) 100 MCG/2ML IJ SOLN
INTRAMUSCULAR | Status: DC | PRN
  Administered 2016-04-08 (×2): 25 ug via INTRAVENOUS
  Administered 2016-04-08: 50 ug via INTRAVENOUS
  Administered 2016-04-08: 25 ug via INTRAVENOUS

## 2016-04-08 MED ORDER — ONDANSETRON HCL 4 MG/2ML IJ SOLN: 4.0000 mg | Freq: Four times a day (QID) | INTRAMUSCULAR | Status: DC | PRN

## 2016-04-08 MED ORDER — HYDRALAZINE HCL 20 MG/ML IJ SOLN: 5.0000 mg | INTRAMUSCULAR | Status: DC | PRN

## 2016-04-08 MED ORDER — LIDOCAINE HCL (PF) 1 % IJ SOLN
INTRAMUSCULAR | Status: DC | PRN
  Administered 2016-04-08 (×2): 10 mL

## 2016-04-08 MED ORDER — HEPARIN (PORCINE) IN NACL 2-0.9 UNIT/ML-% IJ SOLN
INTRAMUSCULAR | Status: DC | PRN
  Administered 2016-04-08: 1000 mL

## 2016-04-08 MED ORDER — POTASSIUM CHLORIDE IN NACL 20-0.9 MEQ/L-% IV SOLN
INTRAVENOUS | Status: DC
  Administered 2016-04-08 – 2016-04-12 (×2): via INTRAVENOUS
  Filled 2016-04-08 (×3): qty 1000

## 2016-04-08 MED ORDER — LABETALOL HCL 5 MG/ML IV SOLN
INTRAVENOUS | Status: AC
  Filled 2016-04-08: qty 4

## 2016-04-08 MED ORDER — HEPARIN SODIUM (PORCINE) 1000 UNIT/ML IJ SOLN
INTRAMUSCULAR | Status: AC
  Filled 2016-04-08: qty 1

## 2016-04-08 MED ORDER — LABETALOL HCL 5 MG/ML IV SOLN: 10.0000 mg | INTRAVENOUS | Status: DC | PRN

## 2016-04-08 MED ORDER — FENTANYL CITRATE (PF) 100 MCG/2ML IJ SOLN
INTRAMUSCULAR | Status: AC
  Filled 2016-04-08: qty 2

## 2016-04-08 MED ORDER — METOPROLOL TARTRATE 5 MG/5ML IV SOLN: 2.0000 mg | INTRAVENOUS | Status: DC | PRN

## 2016-04-08 MED ORDER — LABETALOL HCL 5 MG/ML IV SOLN
INTRAVENOUS | Status: DC | PRN
  Administered 2016-04-08 (×2): 10 mg via INTRAVENOUS

## 2016-04-08 MED ORDER — POTASSIUM CHLORIDE CRYS ER 20 MEQ PO TBCR
20.0000 meq | EXTENDED_RELEASE_TABLET | Freq: Every day | ORAL | Status: AC | PRN
  Administered 2016-04-09: 20 meq via ORAL

## 2016-04-08 SURGICAL SUPPLY — 19 items
BALLN ARMADA 4X60X80 (BALLOONS) ×3
BALLOON ARMADA 4X60X80 (BALLOONS) IMPLANT
CATH ANGIO 5F BER2 65CM (CATHETERS) ×1 IMPLANT
CATH ANGIO 5F PIGTAIL 65CM (CATHETERS) ×1 IMPLANT
CATH SOFT-VU 4F 65 STRAIGHT (CATHETERS) IMPLANT
CATH SOFT-VU STRAIGHT 4F 65CM (CATHETERS) ×3
COVER PRB 48X5XTLSCP FOLD TPE (BAG) IMPLANT
COVER PROBE 5X48 (BAG) ×3
GUIDEWIRE ANGLED .035X150CM (WIRE) ×1 IMPLANT
KIT ENCORE 26 ADVANTAGE (KITS) ×2 IMPLANT
KIT PV (KITS) ×3 IMPLANT
SHEATH BRITE TIP 7FR 35CM (SHEATH) ×1 IMPLANT
SHEATH PINNACLE 5F 10CM (SHEATH) ×1 IMPLANT
STENT ICAST 9X59X80 (Permanent Stent) ×2 IMPLANT
SYR MEDRAD MARK V 150ML (SYRINGE) ×3 IMPLANT
TRANSDUCER W/STOPCOCK (MISCELLANEOUS) ×3 IMPLANT
TRAY PV CATH (CUSTOM PROCEDURE TRAY) ×3 IMPLANT
WIRE AMPLATZ SS-J .035X180CM (WIRE) ×1 IMPLANT
WIRE HITORQ VERSACORE ST 145CM (WIRE) ×1 IMPLANT

## 2016-04-08 NOTE — Progress Notes (Signed)
*  PRELIMINARY RESULTS* Vascular Ultrasound Carotid Duplex (Doppler) has been completed.  Preliminary findings: Right ICA 60-79% stenosis. Left ICA 1-39% stenosis, borderline >40%. Heavy calcific plaque bilateral ICA bulb may obscure higher velocities.  Bilateral ECA >50% stenosis. Right vertebral antegrade vertebral flow. Left vertebral was not visualized.    Farrel DemarkJill Eunice, RDMS, RVT  04/08/2016, 4:08 PM

## 2016-04-08 NOTE — Progress Notes (Addendum)
Site area: LFA Site Prior to Removal:  Level 0 Pressure Applied For:5125min Manual:  yes  Patient Status During Pull: stable  Post Pull Site:  Level 0 Post Pull Instructions Given:  yes Post Pull Pulses Present: doppler Dressing Applied:  tegaderm Bedrest begins @ 1330 till 1830 Comments:by canderson

## 2016-04-08 NOTE — Interval H&P Note (Signed)
History and Physical Interval Note:  04/08/2016 11:01 AM  Caitlin JamesNora J Turi  has presented today for surgery, with the diagnosis of pad, left heel ulcer  The various methods of treatment have been discussed with the patient and family. After consideration of risks, benefits and other options for treatment, the patient has consented to  Procedure(s) with comments: Abdominal Aortogram (N/A) Lower Extremity Angiography (Bilateral) Peripheral Vascular Intervention (Bilateral) - common iliac as a surgical intervention .  The patient's history has been reviewed, patient examined, no change in status, stable for surgery.  I have reviewed the patient's chart and labs.  Questions were answered to the patient's satisfaction.     Fabienne BrunsFields, Froylan Hobby

## 2016-04-08 NOTE — Interval H&P Note (Signed)
History and Physical Interval Note:  04/08/2016 9:06 AM  Caitlin Vasquez  has presented today for surgery, with the diagnosis of pad, left heel ulcer  The various methods of treatment have been discussed with the patient and family. After consideration of risks, benefits and other options for treatment, the patient has consented to  Procedure(s): Abdominal Aortogram w/Lower Extremity (N/A) as a surgical intervention .  The patient's history has been reviewed, patient examined, no change in status, stable for surgery.  I have reviewed the patient's chart and labs.  Questions were answered to the patient's satisfaction.     Fabienne BrunsFields, Addiel Mccardle

## 2016-04-08 NOTE — Progress Notes (Addendum)
Site area:RFA  Site Prior to Removal:  Level 0 Pressure Applied For: Manual:   yes Patient Status During Pull: stable  Post Pull Site:  Level1 Post Pull Instructions Given: yes  Post Pull Pulses Present:doppler  Dressing Applied: tegaderm  Bedrest begins @ 1330 Comments: by Lauro RegulusGeorg Zillich Slightly raised, soft area medial to site. Marked. No change post 10 min observation

## 2016-04-08 NOTE — Progress Notes (Signed)
Lower Extremity Vein Map    Right Great Saphenous Vein   Segment Diameter Comment  1. Origin 3.4156mm   2. High Thigh 2.5762mm   3. Mid Thigh 2.4071mm branch  4. Low Thigh 2.5687mm   5. At Knee 2.926mm   6. High Calf 2.3344mm   7. Low Calf 1.458mm   8. Ankle 2.3571mm      Left Great Saphenous Vein  Segment Diameter Comment  1. Origin 3.6802mm   2. High Thigh 2.3465mm   3. Mid Thigh  4. Low Thigh  5. At Knee  6. High Calf  7. Low Calf  8. Ankle   2.4865mm 2.965mm 2.5755mm 2.6005mm 2.501mm 2.4101mm

## 2016-04-08 NOTE — Progress Notes (Signed)
14 f foley catheter placed by Nigel MormonKim Councilman without difficulty. Draining clear yellow urine.

## 2016-04-08 NOTE — Op Note (Signed)
Procedure: Abdominal aortogram with bilateral lower extremity runoff  Preoperative diagnosis: Nonhealing wound left leg postoperative diagnosis: Same  Anesthesia: Local with IV sedation  Operative findings: #1 left common iliac occlusion stented with 9 x 59 I cast to 0 residual stenosis  #2 70% stenosis right common iliac stenting with 9 x 59 I cast 20 residual stenosis  #3 bilateral 7 French sheaths  #4 bilateral superficial femoral artery occlusions  #5 patent below-knee popliteal artery with two-vessel runoff bilaterally anterior tibial peroneal  Operative details: After obtaining informed consent, the patient was taken to the LAD. The patient was placed in supine position Angio table. Both groins were prepped and draped in usual sterile fashion. Local anesthesia was insured over the right common femoral artery. Ultrasound was used to identify the right common femoral artery and femoral bifurcation. The right common femoral artery was then cannulated using an introducer needle using ultrasound guidance. An 035 versacore wire was threaded up into the right pelvis under fluoroscopic guidance. Next a 5 French sheath was passed over the guidewire in the right common femoral artery. This was thoroughly flushed with heparinized saline. I tried to advance the versacore wire up into the abdominal aorta but due to heavy calcification in most likely a stenosis in the common iliac artery this would not advance easily. Therefore a 5 [place] Berenstein 2 catheter was brought up in the operative field and advanced over the guidewire for extra support. Versacore wire was then removed and exchanged for an 035 angled Glidewire. I was then able to advance this fairly easily up into the abdominal aorta. The Berenstein catheter was removed and exchanged for a 5 French pigtail catheter. Abdominal aortogram was obtained in AP projection through the pigtail catheter. There is some tortuosity to the patient's abdominal  aorta. The aorta is slightly dilated up to 2.5 cm intraluminally. This appeared to be a small abdominal aortic aneurysm but I felt the measurements were reasonably accurate measuring calcified wall the calcified wall. Consideration should be given for abdominal CT or ultrasound to further clarify the diameter of the patient's aorta. The left and right renal arteries are patent. The inferior mesenteric artery is patent but does have a high-grade stenosis at its origin. The left common iliac artery is occluded but reconstitutes just above the left iliac bifurcation. Next the pigtail catheter was pulled down just above the aortic bifurcation and magnified views and AP LAO and RAO projection were obtained to further define this anatomy. The patient also has a 70% stenosis of the right common iliac artery. Next lower extremity runoff views were obtained through the pigtail catheter.  In the right lower externa, the right common femoral and profunda femoris artery is patent. The right superficial femoral artery is patent proximally but becomes more disease in its midportion and occludes. The below-knee popliteal artery the reconstitutes via collaterals and gives off the anterior tibial and peroneal arteries as runoff to the right foot.  In the left lower extremity, the left common femoral artery is patent. Left superficial femoral artery is occluded at its origin. The profunda is patent. The left below-knee popliteal artery reconstitutes via profunda collaterals. There is two-vessel runoff to the left foot via the anterior tibial and peroneal arteries.  At this point it was decided to intervene on the left and right common iliac artery occlusions. Local anesthesia was inflated over the left common femoral artery. Ultrasound was used to identify the left common femoral artery in an introducer needle was used to  cannulate the left common femoral artery without difficulty. An 035 versacore wire was then threaded up  into the midportion of the left common iliac artery and a 7 French bright tip sheath advanced over this into the external iliac artery. The patient was given 5000 units of intravenous heparin. The versacore wire was exchanged for an 035 angled Glidewire. This was then advanced up to the level of the occlusion and refluxed on itself and advanced using a push technique and a Glidewire could be easily advanced across the lesion and back up into the true lumen of the abdominal aorta. A 4 French straight catheter was advanced over the Glidewire. The Glidewire was then exchanged for an 035 Amplatz wire. I then advanced the Amplatz wire all the way up to the descending thoracic aorta. The 5 [place] Berenstein catheter was then advanced to this level and the Amplatz wire removed and contrast angiogram performed to confirm we were within the true lumen of the aorta. At this point the Amplatz wire was placed back through the catheter and the Berenstein catheter removed. I then placed the dilator back into the 7 French sheath on the left side. I tried to advance a 7 for an sheath and dilator up over the wire to predilate the lesion but due to heavy calcification and a very narrowed lumen this would not advance easily. Therefore the dilator was removed from the 7 French sheath on the left side. A contrast angiogram was performed through the pigtail from the right side. This was used to make measurements off of the iliac arteries and determine precise length of the stents. 9 x 59 mm I cast stents were selected. I also felt I would need to raise the bifurcation as the stenosis extended to the very edge of the iliac origin on both sides. ACT was confirmed greater than 220. I tried to advance the I cast stent of the left iliac system but again due to tortuosity and narrowing this would not advance easily. At this point I exchanged the 5 French sheath in the right groin over the guidewire for a 7 French bright tip sheath. Therefore a  4 x 2 [place] balloon was brought in operative field and advanced over the wire and then inflated to 8 atm for 1 minute in the left iliac system to predilate. The balloon was then removed after deflation and the 9 x 59 I cast stent easily advanced across the lesion at this point. I also brought an additional 9 x 59 stent from the right side after exchanging the pigtail catheter over a versacore wire. These were both then deployed simultaneously to 8 atm for 1 minute. The balloons were then removed from both sides. The pectoral catheter was placed back up the right side over the wire and a completion injury gram obtained. This showed wide patency of both the left and right common iliac systems. The right internal iliac artery on the right side was heavily diseased but patent. The left stent seem to encroach slightly on the origin of the left internal iliac artery but the flow actually into the internal iliac artery was much improved from preprocedure. At this point pectoral catheter was removed over the Amplatz wire. The guidewire was also removed from the left groin. Both 7 French sheaths were left in place to be pulled in the holding area.  Operative management: Successful primary common iliac stenting to 0 residual stenosis. The patient does still have downstream bilateral superficial femoral artery occlusions. Dr.  Bridgett Larsson will decide on the management plan for these. The patient may also have a small abdominal aortic aneurysm. Dr. Bridgett Larsson will decide whether or not he wishes to perform ultrasound of her aorta or CT Angio further determine this.  Ruta Hinds, MD [facility] Office: [phone] Pager: [phone]

## 2016-04-08 NOTE — Progress Notes (Signed)
Call [facility] and spoke to charge R.N. And she is looking for patient belongings. Patient states she put her purse in closet. She also has cloths and o2 tank

## 2016-04-08 NOTE — H&P (View-Only) (Signed)
Hospital Consult    Reason for Consult:  Left heel wound  Referring Physician:  Dr. Algie CofferKadakia MRN #:  [id number]  History of Present Illness: This is a 71 y.o. female who presented to her cardiologist office yesterday with c/o leg swelling and shortness of breath.  She is on 2 L O2 [place] continuously 24 hours a day.  She states that she used to smoke about 1-1.5 ppd from age 71 to [age] but now smokes e-cigarettes.    She states she has a wound on her left heel that came up last Friday.  Her friend has been coming over and tending to this wound.  She states she has not had any pain in her leg.  She states that it has been weaker since she broke her hip in December at which time she had surgery.  She has not had any wounds prior to this.  See denies any intermittent claudication or rest pain.  She denies diabetes.  She does have hypertension and states it is under pretty good control and is on a CCB, ARB and spironolactone.  She is not on a statin.    She denies any chest pain/pressure.  She denies hx of stroke.    Past Medical History:  Diagnosis Date  . COPD (chronic obstructive pulmonary disease) (HCC)   . High cholesterol   . On home oxygen therapy    "2L; all the time" (04/06/2016)  . Pneumonia    "when I was a kid"   . Urinary hesitancy     Past Surgical History:  Procedure Laterality Date  . CARDIAC CATHETERIZATION N/A 11/28/2014   Procedure: Right/Left Heart Cath and Coronary Angiography;  Surgeon: Orpah CobbAjay Kadakia, MD;  Location: [facility];  Service: Cardiovascular;  Laterality: N/A;  . CARDIAC CATHETERIZATION  08/2006   Hattie Perch/notes 08/25/2006  . JOINT REPLACEMENT    . TOTAL HIP ARTHROPLASTY Left 01/29/2016    Allergies  Allergen Reactions  . Clonidine Derivatives Other (See Comments)    hypotension    Prior to Admission medications   Medication Sig Start Date End Date Taking? Authorizing Provider  albuterol (PROVENTIL HFA;VENTOLIN HFA) 108 (90 BASE) MCG/ACT inhaler  Inhale 2 puffs into the lungs every 6 (six) hours as needed for wheezing or shortness of breath.   Yes Historical Provider, MD  ALPRAZolam Prudy Feeler(XANAX) 0.25 MG tablet Take 0.25 mg by mouth at bedtime as needed for anxiety.   Yes Historical Provider, MD  amLODipine (NORVASC) 10 MG tablet Take 10 mg by mouth daily.   Yes Historical Provider, MD  CALCIUM PO Take 1 tablet by mouth daily.   Yes Historical Provider, MD  ipratropium (ATROVENT) 0.02 % nebulizer solution Take 0.5 mg by nebulization every 6 (six) hours as needed for shortness of breath.    Yes Historical Provider, MD  losartan (COZAAR) 100 MG tablet Take 100 mg by mouth daily.   Yes Historical Provider, MD  oxyCODONE (OXY IR/ROXICODONE) 5 MG immediate release tablet Take 5-10 mg by mouth every 6 (six) hours as needed for severe pain.   Yes Historical Provider, MD  spironolactone (ALDACTONE) 25 MG tablet Take 12.5 mg by mouth daily.   Yes Historical Provider, MD  Tiotropium Bromide-Olodaterol (STIOLTO RESPIMAT) 2.5-2.5 MCG/ACT AERS Inhale 2 puffs into the lungs daily.    Yes Historical Provider, MD  Vitamin D, Ergocalciferol, (DRISDOL) 50000 units CAPS capsule Take 50,000 Units by mouth every 7 (seven) days.   Yes Historical Provider, MD    Social  History   Social History  . Marital status: Widowed    Spouse name: N/A  . Number of children: N/A  . Years of education: N/A   Occupational History  . Not on file.   Social History Main Topics  . Smoking status: Former Smoker    Packs/day: 1.00    Years: 55.00    Types: Cigarettes    Quit date: 2016  . Smokeless tobacco: Never Used  . Alcohol use No  . Drug use: No  . Sexual activity: No   Other Topics Concern  . Not on file   Social History Narrative  . No narrative on file     Family History  Problem Relation Age of Onset  . Heart attack Father     ROS: [x]  Positive   [ ]  Negative   [ ]  All sytems reviewed and are negative  Cardiovascular: []  chest pain/pressure []   palpitations [x]  SOB on home O2 continuously  []  DOE []  pain in legs while walking []  pain in legs at rest []  pain in legs at night []  non-healing ulcers []  hx of DVT [x]  swelling in legs  Pulmonary: []  productive cough []  asthma/wheezing [x]  home O2 [x]  COPD  Neurologic: []  weakness in []  arms []  legs []  numbness in []  arms []  legs []  hx of CVA []  mini stroke [] difficulty speaking or slurred speech []  temporary loss of vision in one eye []  dizziness  Hematologic: []  hx of cancer []  bleeding problems []  problems with blood clotting easily  Endocrine:   []  diabetes []  thyroid disease  GI []  vomiting blood []  blood in stool  GU: []  CKD/renal failure []  HD--[]  M/W/F or []  T/T/S []  burning with urination []  blood in urine [x]  urinary hesitancy   Psychiatric: []  anxiety []  depression  Musculoskeletal: []  arthritis [x]  joint pain [x]  hx broken hip in December with surgery  Integumentary: []  rashes []  ulcers  Constitutional: []  fever []  chills   Physical Examination  Vitals:   04/07/16 0800 04/07/16 1040  BP: 135/67 (!) 142/72  Pulse: 87 93  Resp: 18 18  Temp: 97.6 F (36.4 C) 97.7 F (36.5 C)   Body mass index is 18.26 kg/m.  General:  WDWN in NAD Gait: Not observed HENT: WNL, normocephalic Pulmonary: normal non-labored breathing, without Rales, rhonchi,  wheezing Cardiac: regular, without  Murmurs, rubs or gallops; with carotid bruits Abdomen:  soft, NT/ND, no masses Skin: petechial rash on R forearm, L hip incision incompletely healed, some residual scabs, No infection present.  Vascular Exam/Pulses:  Right Left  Radial 2+ (normal) 2+ (normal)  Ulnar 2+ (normal) 2+ (normal)  Femoral 2+ (normal) absent  Popliteal Unable to palpate  Unable to palpate   DP Unable to palpate  Unable to palpate   PT Unable to palpate  Unable to palpate    Extremities: wound left lateral heel with fibrinous tissue wound bed measuring 2 cm x 3 cm;  mild swelling LLE Musculoskeletal: motor 5/5 throughout, no muscle wasting or atrophy, asx tibia with evidence of L tibia bony hypertrophy Neurologic: A&O X 3;  No focal weakness or paresthesias are detected; speech is fluent/normal, intact L foot sensation though less than R Psychiatric:  The pt has Normal affect, judgement in tact Lymph:  No inguinal lymphadenopathy, mild cervical LAD   CBC    Component Value Date/Time   WBC 8.5 04/06/2016 1456   RBC 4.31 04/06/2016 1456   HGB 11.4 (L) 04/06/2016 1456   HCT 35.4 (  L) 04/06/2016 1456   PLT 349 04/06/2016 1456   MCV 82.1 04/06/2016 1456   MCH 26.5 04/06/2016 1456   MCHC 32.2 04/06/2016 1456   RDW 13.7 04/06/2016 1456   LYMPHSABS 1.5 04/06/2016 1456   MONOABS 0.5 04/06/2016 1456   EOSABS 0.6 04/06/2016 1456   BASOSABS 0.0 04/06/2016 1456    BMET    Component Value Date/Time   NA 137 04/07/2016 0331   K 3.4 (L) 04/07/2016 0331   CL 98 (L) 04/07/2016 0331   CO2 31 04/07/2016 0331   GLUCOSE 107 (H) 04/07/2016 0331   BUN 14 04/07/2016 0331   CREATININE 0.80 04/07/2016 0331   CALCIUM 9.1 04/07/2016 0331   GFRNONAA >60 04/07/2016 0331   GFRAA >60 04/07/2016 0331    COAGS: Lab Results  Component Value Date   INR 1.03 11/28/2014     Non-Invasive Vascular Imaging:   Venous Duplex 04/07/16: Preliminary report:  There is no DVT or SVT noted in the bilateral lower extremities.  Incidentally, there seems to be no significant arterial flow throughout the left lower extremity.  ABI's 04/07/16:  RIGHT    LEFT    PRESSURE WAVEFORM  PRESSURE WAVEFORM  BRACHIAL 2 IVs T BRACHIAL 148 T  DP   DP    AT 83 DM AT 40 SDM,   PT 85 DM PT  absent  PER   PER    GREAT TOE  NA GREAT TOE  NA    RIGHT LEFT  ABI .57 .27   2D echocardiogram 04/07/16: Study Conclusions  - Left ventricle: The cavity size was normal. There was mild   concentric hypertrophy. Systolic function was normal. Wall motion   was normal; there were  no regional wall motion abnormalities.   Doppler parameters are consistent with abnormal left ventricular   relaxation (grade 1 diastolic dysfunction). - Left atrium: The atrium was mildly dilated. - Right ventricle: There was mild hypertrophy.  Statin:  Yes.   Beta Blocker:  Yes Aspirin:  Yes.   ACEI:  No. ARB:  No. CCB use:  Yes Other antiplatelets/anticoagulants:  Yes.   SQ heparin   Laboratory: CBC:    Component Value Date/Time   WBC 8.5 04/06/2016 1456   RBC 4.31 04/06/2016 1456   HGB 11.4 (L) 04/06/2016 1456   HCT 35.4 (L) 04/06/2016 1456   PLT 349 04/06/2016 1456   MCV 82.1 04/06/2016 1456   MCH 26.5 04/06/2016 1456   MCHC 32.2 04/06/2016 1456   RDW 13.7 04/06/2016 1456   LYMPHSABS 1.5 04/06/2016 1456   MONOABS 0.5 04/06/2016 1456   EOSABS 0.6 04/06/2016 1456   BASOSABS 0.0 04/06/2016 1456    BMP:    Component Value Date/Time   NA 137 04/07/2016 0331   K 3.4 (L) 04/07/2016 0331   CL 98 (L) 04/07/2016 0331   CO2 31 04/07/2016 0331   GLUCOSE 107 (H) 04/07/2016 0331   BUN 14 04/07/2016 0331   CREATININE 0.80 04/07/2016 0331   CALCIUM 9.1 04/07/2016 0331   GFRNONAA >60 04/07/2016 0331   GFRAA >60 04/07/2016 0331    Coagulation: Lab Results  Component Value Date   INR 1.03 11/28/2014   No results found for: PTT  Lipids: No results found for: CHOL, TRIG, HDL, CHOLHDL, VLDL, LDLCALC, LDLDIRECT   Radiology: Dg Chest 2 View  Result Date: 04/06/2016 CLINICAL DATA:  Congestive heart failure, dyspnea EXAM: CHEST  2 VIEW COMPARISON:  01/28/2016 FINDINGS: The heart size is normal. Stable aortic atherosclerosis without aneurysm.  Fine interstitial lung markings consistent with interstitial fibrosis bilaterally. No pulmonary consolidation, effusion or pneumothorax. No overt pulmonary edema. Chronic compression deformity of mid and lower thoracic vertebrae as well as osteoarthritis of the shoulders. IMPRESSION: No active cardiopulmonary disease. Fine interstitial  prominence of the lungs bilaterally consistent with fibrosis. Electronically Signed   By: Tollie Eth M.D.   On: 04/06/2016 18:46   Ct Angio Chest Pe W Or Wo Contrast  Result Date: 04/07/2016 CLINICAL DATA:  Shortness of breath at rest. EXAM: CT ANGIOGRAPHY CHEST WITH CONTRAST TECHNIQUE: Multidetector CT imaging of the chest was performed using the standard protocol during bolus administration of intravenous contrast. Multiplanar CT image reconstructions and MIPs were obtained to evaluate the vascular anatomy. CONTRAST:  100 cc Isovue 370. COMPARISON:  PA and lateral chest 04/06/2016. FINDINGS: Cardiovascular: No pulmonary embolus is identified. Heart size is normal. There is calcific aortic and coronary atherosclerosis. No pericardial effusion. Mediastinum/Nodes: The thyroid appears normal. No hiatal hernia. A few small mediastinal lymph nodes are identified including a precarinal node on image 42 measuring 0.9 cm short axis dimension and a right hilar node on image 51 measuring 1.3 cm short axis dimension. Lungs/Pleura: No pleural effusion. Severe centrilobular emphysema is seen. Mild dependent atelectasis is noted. A right lower lobe nodule on image 68 measures 0.7 cm in diameter. A second nodule in the right lower lobe on image 91 measures 0.4 cm measures 0.4 cm. Upper Abdomen: Partial visualization of a cyst in the upper pole the right kidney is identified. No acute abnormality. Musculoskeletal: Remote T11 and T12 compression fractures are seen. No lytic or sclerotic lesion. Review of the MIP images confirms the above findings. IMPRESSION: Negative for pulmonary embolus. Severe centrilobular emphysema. Two right lower lobe pulmonary nodules are identified. The larger measures 0.7 cm. Non-contrast chest CT at 3-6 months is recommended. If nodules persist, subsequent management will be based upon the most suspicious nodule(s). This recommendation follows the consensus statement: Guidelines for Management of  Incidental Pulmonary Nodules Detected on CT Images: From the Fleischner Society 2017; Radiology 2017; 284:228-243. Small mediastinal lymph nodes are likely reactive. Calcific aortic and coronary atherosclerosis. Electronically Signed   By: Drusilla Kanner M.D.   On: 04/07/2016 07:28    ASSESSMENT/PLAN: This is a 71 y.o. female with critical limb ischemia L leg in form of heel ulcer, R leg moderate peripheral arterial disease, known multivessel CAD, COPD   -pt has absent left femoral pulse suggesting iliac disease/occlusion.  Will plan for arteriogram on Friday by Dr. Darrick Penna with possible intervention. (normal renal function).  She has an easily palpable right femoral pulse. -continue to float heels to avoid pressure on her feet. -continue current wound care (Santyl to wound daily) -she does have a right carotid bruit and will order carotid duplex. -stressed the importance of smoking cessation -was not on a statin prior to admission and has been started on one -npo after MN/consent ordered   Doreatha Massed, PA-C [facility] [phone]  Addendum  I have independently interviewed and examined the patient, and I agree with the physician assistant's findings.  Pt has NO evidence of threatened limb at this point with intact motor and sensation in L leg and dopplerable AT on ABI.  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  Will need: aortogram, BRo, possible L leg intervention.  Dr. Darrick Penna will perform this procedure for the patient as I  am in clinic all day tomorrow.   I discussed with the patient the nature of angiographic procedures, especially the limited patencies of any endovascular intervention.    The patient is aware of that the risks of an angiographic procedure include but are not limited to: bleeding, infection, access site complications, renal failure, embolization, rupture of vessel, dissection, arteriovenous  fistula, possible need for emergent surgical intervention, possible need for surgical procedures to treat the patient's pathology, anaphylactic reaction to contrast, and stroke and death.    The patient is aware of the risks and agrees to proceed.  Would also get B GSV mapping in event bypass is needed  Risk stratification and preop optimization per Dr. Algie Coffer   Leonides Sake, MD, FACS [facility]: [phone] Pager: [phone]  04/07/2016, 4:05 PM

## 2016-04-08 NOTE — Progress Notes (Signed)
Ref: Duran Ohern S, MD   Subjective:  Feeling little better post common iliac stenting. Bilateral superficial femoral artery occlusion will be treated by Dr. Imogene Burn early next week.  Objective:  Vital Signs in the last 24 hours: Temp:  [97.6 F (36.4 C)-98.7 F (37.1 C)] 97.7 F (36.5 C) (03/02 1425) Pulse Rate:  [47-108] 91 (03/02 1425) Cardiac Rhythm: Normal sinus rhythm (03/02 0849) Resp:  [16-34] 20 (03/02 1425) BP: (107-167)/(46-105) 165/70 (03/02 1425) SpO2:  [84 %-100 %] 100 % (03/02 1425) Weight:  [48.7 kg (107 lb 4.8 oz)] 48.7 kg (107 lb 4.8 oz) (03/02 0447)  Physical Exam: BP Readings from Last 1 Encounters:  04/08/16 (!) 165/70    Wt Readings from Last 1 Encounters:  04/08/16 48.7 kg (107 lb 4.8 oz)    Weight change: 0.091 kg (3.2 oz) Body mass index is 18.42 kg/m. HEENT: [place]/AT, Eyes-Hazel, PERL, EOMI, Conjunctiva-Pink, Sclera-Non-icteric Neck: No JVD, No bruit, Trachea midline. Lungs:  Clearing, Bilateral. Cardiac:  Regular rhythm, normal S1 and S2, no S3. II/VI systolic murmur. Abdomen:  Soft, non-tender. BS present. Extremities:  1 + edema present. No cyanosis. No clubbing. CNS: AxOx3, Cranial nerves grossly intact, moves all 4 extremities.  Skin: Warm and dry.   Intake/Output from previous day: 03/01 0701 - 03/02 0700 In: 360 [P.O.:360] Out: 1450 [Urine:1450]    Lab Results: BMET    Component Value Date/Time   NA 136 04/08/2016 0331   NA 137 04/07/2016 0331   NA 135 04/06/2016 1456   K 3.3 (L) 04/08/2016 0331   K 3.4 (L) 04/07/2016 0331   K 3.6 04/06/2016 1456   CL 95 (L) 04/08/2016 0331   CL 98 (L) 04/07/2016 0331   CL 96 (L) 04/06/2016 1456   CO2 28 04/08/2016 0331   CO2 31 04/07/2016 0331   CO2 29 04/06/2016 1456   GLUCOSE 132 (H) 04/08/2016 0331   GLUCOSE 107 (H) 04/07/2016 0331   GLUCOSE 119 (H) 04/06/2016 1456   BUN 15 04/08/2016 0331   BUN 14 04/07/2016 0331   BUN 9 04/06/2016 1456   CREATININE 0.73 04/08/2016 0331   CREATININE  0.80 04/07/2016 0331   CREATININE 0.71 04/06/2016 1456   CALCIUM 9.4 04/08/2016 0331   CALCIUM 9.1 04/07/2016 0331   CALCIUM 9.5 04/06/2016 1456   GFRNONAA >60 04/08/2016 0331   GFRNONAA >60 04/07/2016 0331   GFRNONAA >60 04/06/2016 1456   GFRAA >60 04/08/2016 0331   GFRAA >60 04/07/2016 0331   GFRAA >60 04/06/2016 1456   CBC    Component Value Date/Time   WBC 8.5 04/06/2016 1456   RBC 4.31 04/06/2016 1456   HGB 11.4 (L) 04/06/2016 1456   HCT 35.4 (L) 04/06/2016 1456   PLT 349 04/06/2016 1456   MCV 82.1 04/06/2016 1456   MCH 26.5 04/06/2016 1456   MCHC 32.2 04/06/2016 1456   RDW 13.7 04/06/2016 1456   LYMPHSABS 1.5 04/06/2016 1456   MONOABS 0.5 04/06/2016 1456   EOSABS 0.6 04/06/2016 1456   BASOSABS 0.0 04/06/2016 1456   HEPATIC Function Panel  Recent Labs  04/06/16 1456  PROT 7.7   HEMOGLOBIN A1C No components found for: HGA1C,  MPG CARDIAC ENZYMES Lab Results  Component Value Date   TROPONINI <0.03 04/07/2016   TROPONINI <0.03 04/06/2016   TROPONINI <0.03 04/06/2016   BNP No results for input(s): PROBNP in the last 8760 hours. TSH No results for input(s): TSH in the last 8760 hours. CHOLESTEROL No results for input(s): CHOL in the last 8760  hours.  Scheduled Meds: . amLODipine  10 mg Oral Daily  . aspirin EC  81 mg Oral Daily  . atorvastatin  20 mg Oral Once per day on Mon Wed Fri  . carvedilol  12.5 mg Oral BID WC  . collagenase   Topical Daily  . feeding supplement (ENSURE ENLIVE)  237 mL Oral BID BM  . heparin  5,000 Units Subcutaneous Q8H  . mouth rinse  15 mL Mouth Rinse BID  . potassium chloride  10 mEq Oral BID  . sodium chloride flush  3 mL Intravenous Q12H  . spironolactone  12.5 mg Oral Daily   Continuous Infusions: . sodium chloride 100 mL/hr at 04/08/16 1143  . 0.9 % NaCl with KCl 20 mEq / L     PRN Meds:.sodium chloride, acetaminophen, hydrALAZINE, HYDROcodone-acetaminophen, labetalol, levalbuterol, magnesium sulfate 1 - 4 g bolus  IVPB, metoprolol, ondansetron (ZOFRAN) IV, ondansetron, potassium chloride, sodium chloride flush  Assessment/Plan: Left leg severe PVD with claudication and heel ulcer Right leg significant PVD. COPD with pulmonary fibrosis Hypertension Moderate protein calorie malnutrition CAD  Continue medical treatment. Increase activity as tolerated.   LOS: 2 days    Orpah CobbAjay Nana Vastine  MD  04/08/2016, 2:34 PM

## 2016-04-08 NOTE — Progress Notes (Signed)
Patient has been NPO after midnight. Slept mostly during the night. Complained of heel pain, PRN medication given. Will continue to monitor.  Aamira Bischoff, RN

## 2016-04-08 NOTE — Progress Notes (Addendum)
   Daily Progress Note  Pt had her inflow procedure today with B CIA stents placed.  Pt will need the L SFA occlusion addressed.  The patient has flush SFA occlusion, so I doubt an endovascular approach without a retrograde popliteal access would be successful.  - BB GSV pending - Preop cardiac risk stratification - If acceptable risk, would consider L CFA to AK vs BK bypass with GSV vs Propaten on Tuesday otherwise would attempt L pop access with attempt at SAFARI on L  Caitlin SakeBrian Chen, MD, FACS [facility] [place] Office: [phone] Pager: [phone]  04/08/2016, 12:51 PM

## 2016-04-09 LAB — BASIC METABOLIC PANEL
Anion gap: 13 (ref 5–15)
BUN: 20 mg/dL (ref 6–20)
CO2: 25 mmol/L (ref 22–32)
Calcium: 8.7 mg/dL — ABNORMAL LOW (ref 8.9–10.3)
Chloride: 97 mmol/L — ABNORMAL LOW (ref 101–111)
Creatinine, Ser: 0.78 mg/dL (ref 0.44–1.00)
GFR calc Af Amer: >60 mL/min (ref >60)
GFR calc non Af Amer: >60 mL/min (ref >60)
Glucose, Bld: 118 mg/dL — ABNORMAL HIGH (ref 65–99)
Potassium: 3.1 mmol/L — ABNORMAL LOW (ref 3.5–5.1)
Sodium: 135 mmol/L (ref 135–145)

## 2016-04-09 LAB — CBC
HCT: 27.9 % — ABNORMAL LOW (ref 36.0–46.0)
Hemoglobin: 8.9 g/dL — ABNORMAL LOW (ref 12.0–15.0)
MCH: 25.8 pg — ABNORMAL LOW (ref 26.0–34.0)
MCHC: 31.9 g/dL (ref 30.0–36.0)
MCV: 80.9 fL (ref 78.0–100.0)
Platelets: 288 10*3/uL (ref 150–400)
RBC: 3.45 MIL/uL — ABNORMAL LOW (ref 3.87–5.11)
RDW: 14.3 % (ref 11.5–15.5)
WBC: 10.5 10*3/uL (ref 4.0–10.5)

## 2016-04-09 MED ORDER — POTASSIUM CHLORIDE CRYS ER 20 MEQ PO TBCR
40.0000 meq | EXTENDED_RELEASE_TABLET | Freq: Once | ORAL | Status: AC
  Administered 2016-04-09: 40 meq via ORAL
  Filled 2016-04-09: qty 2

## 2016-04-09 NOTE — Progress Notes (Signed)
Subjective:  Patient denies any chest pain or shortness of breath complaints of generalized weakness. Has significant drop in hemoglobin from 11.4 to 8.9 today.  Objective:  Vital Signs in the last 24 hours: Temp:  [97.7 F (36.5 C)-98.7 F (37.1 C)] 98.3 F (36.8 C) (03/03 0431) Pulse Rate:  [47-106] 96 (03/03 0431) Resp:  [12-34] 12 (03/03 0431) BP: (107-167)/(46-105) 135/63 (03/03 0431) SpO2:  [84 %-100 %] 95 % (03/03 0431) Weight:  [109 lb 4.8 oz (49.6 kg)] 109 lb 4.8 oz (49.6 kg) (03/03 0431)  Intake/Output from previous day: 03/02 0701 - 03/03 0700 In: 360 [P.O.:360] Out: 2700 [Urine:2700] Intake/Output from this shift: No intake/output data recorded.  Physical Exam: Neck: no adenopathy, no carotid bruit, no JVD and supple, symmetrical, trachea midline Lungs: Decreased breath sound at bases Heart: regular rate and rhythm, S1, S2 normal and 2/6 systolic murmur noted Abdomen: soft, non-tender; bowel sounds normal; no masses,  no organomegaly Extremities: extremities normal, atraumatic, no cyanosis or edema  Lab Results:  Recent Labs  04/06/16 1456 04/09/16 0325  WBC 8.5 10.5  HGB 11.4* 8.9*  PLT 349 288    Recent Labs  04/08/16 0331 04/09/16 0325  NA 136 135  K 3.3* 3.1*  CL 95* 97*  CO2 28 25  GLUCOSE 132* 118*  BUN 15 20  CREATININE 0.73 0.78    Recent Labs  04/06/16 2114 04/07/16 0331  TROPONINI <0.03 <0.03   Hepatic Function Panel  Recent Labs  04/06/16 1456  PROT 7.7  ALBUMIN 4.1  AST 21  ALT 17  ALKPHOS 86  BILITOT 0.4   No results for input(s): CHOL in the last 72 hours. No results for input(s): PROTIME in the last 72 hours.  Imaging: Imaging results have been reviewed and [place] Aorta  Result Date: 04/08/2016 CLINICAL DATA:  Aortic aneurysm. Patient had procedure in cath lab today. EXAM: ULTRASOUND OF ABDOMINAL AORTA TECHNIQUE: Ultrasound examination of the abdominal aorta was performed to evaluate for abdominal aortic aneurysm.  COMPARISON:  None. FINDINGS: Abdominal Aorta The aortic not be identified due to extensive overlying bowel gas. IMPRESSION: Nonvisualization of the aorta due to overlying bowel gas. Assessment for aortic aneurysm would be better assessed with CT given these limitations. Electronically Signed   By: Tollie Ethavid  Kwon M.D.   On: 04/08/2016 17:40    Cardiac Studies:  Assessment/Plan:  Symptomatic severe peripheral vascular disease with left heel ulcer status post bilateral iliac stenting Multivessel CAD Hypertension COPD with pulmonary fibrosis Protein calorie malnutrition Acute on chronic anemia probably secondary to blood loss during the procedure and hydration rule out GI loss Hypokalemia Plan Check labs in a.m. check stool for occult blood Replace K Reviewed angiogram from 2016 patient had moderately severe sequential left circumflex stenosis and moderate RCA stenosis. Will discuss with  Dr. Algie CofferKadakia regarding coronary intervention prior to vascular surgery.  LOS: 3 days    Caitlin CloudHarwani, Caitlin Vasquez 04/09/2016, 10:57 AM

## 2016-04-09 NOTE — Progress Notes (Addendum)
[facility] [place]  Subjective  - patient aware of surgery plan for Tuesday 04/12/2016 and wants to go home and come back for procedure if OK.   Objective 135/63 96 98.3 F (36.8 C) (Oral) 12 95%  Intake/Output Summary (Last 24 hours) at 04/09/16 0850 Last data filed at 04/08/16 2053  Gross per 24 hour  Intake              360 ml  Output             2700 ml  Net            -2340 ml    Doppler DP bilaterally Left heel ulcer no change Groins soft NTTP, without hematoma  Assessment/Planning: POD # 1 #1 left common iliac occlusion stented with 9 x 59 I cast to 0 residual stenosis  #2 70% stenosis right common iliac stenting with 9 x 59 I cast 20 residual stenosis  - BB GSV pending - Preop cardiac risk stratification - If acceptable risk, would consider L CFA to AK vs BK bypass with GSV vs Propaten on Tuesday otherwise would attempt L pop access with attempt at [facility]ARI on L  Patient on heparin 5,000 SQ TID. Foley to gravity ?   Clinton GallantCOLLINS, EMMA [facility] 04/09/2016 8:50 AM -- 2+ femoral pulses bilaterally no hematoma Feet unchanged Foley was for inability to use bedpan yesterday will d/c  Dr Imogene Burnhen to do fempop on Tuesday Pt wants to go home.  Ok from my standpoint if Dr Sharyn LullHarwani thinks she needs no further cardiac testing prior to fem pop. ASA alone for her iliac stents.   Laboratory Lab Results:  Recent Labs  04/06/16 1456 04/09/16 0325  WBC 8.5 10.5  HGB 11.4* 8.9*  HCT 35.4* 27.9*  PLT 349 288   BMET  Recent Labs  04/08/16 0331 04/09/16 0325  NA 136 135  K 3.3* 3.1*  CL 95* 97*  CO2 28 25  GLUCOSE 132* 118*  BUN 15 20  CREATININE 0.73 0.78  CALCIUM 9.4 8.7*    COAG Lab Results  Component Value Date   INR 1.03 11/28/2014   No results found for: PTT

## 2016-04-10 LAB — CBC
HCT: 22.7 % — ABNORMAL LOW (ref 36.0–46.0)
HCT: 21.5 % — ABNORMAL LOW (ref 36.0–46.0)
Hemoglobin: 7.3 g/dL — ABNORMAL LOW (ref 12.0–15.0)
Hemoglobin: 7 g/dL — ABNORMAL LOW (ref 12.0–15.0)
MCH: 26.3 pg (ref 26.0–34.0)
MCH: 26.6 pg (ref 26.0–34.0)
MCHC: 32.2 g/dL (ref 30.0–36.0)
MCHC: 32.6 g/dL (ref 30.0–36.0)
MCV: 81.7 fL (ref 78.0–100.0)
MCV: 81.7 fL (ref 78.0–100.0)
Platelets: 262 10*3/uL (ref 150–400)
Platelets: 221 10*3/uL (ref 150–400)
RBC: 2.78 MIL/uL — ABNORMAL LOW (ref 3.87–5.11)
RBC: 2.63 MIL/uL — ABNORMAL LOW (ref 3.87–5.11)
RDW: 14.2 % (ref 11.5–15.5)
RDW: 14.2 % (ref 11.5–15.5)
WBC: 12.9 10*3/uL — ABNORMAL HIGH (ref 4.0–10.5)
WBC: 10.6 10*3/uL — ABNORMAL HIGH (ref 4.0–10.5)

## 2016-04-10 LAB — VAS US CAROTID
LEFT ECA DIAS: 35 cm/s
Left CCA dist dias: 17 cm/s
Left CCA dist sys: 62 cm/s
Left CCA prox dias: 8 cm/s
Left CCA prox sys: 61 cm/s
Left ICA dist dias: -22 cm/s
Left ICA dist sys: -105 cm/s
Left ICA prox dias: -34 cm/s
Left ICA prox sys: -187 cm/s
RIGHT ECA DIAS: 48 cm/s
RIGHT VERTEBRAL DIAS: 14 cm/s
Right CCA prox dias: 10 cm/s
Right CCA prox sys: 67 cm/s
Right cca dist sys: -86 cm/s

## 2016-04-10 LAB — BASIC METABOLIC PANEL
Anion gap: 7 (ref 5–15)
BUN: 29 mg/dL — ABNORMAL HIGH (ref 6–20)
CO2: 23 mmol/L (ref 22–32)
Calcium: 8.5 mg/dL — ABNORMAL LOW (ref 8.9–10.3)
Chloride: 105 mmol/L (ref 101–111)
Creatinine, Ser: 0.69 mg/dL (ref 0.44–1.00)
GFR calc Af Amer: >60 mL/min (ref >60)
GFR calc non Af Amer: >60 mL/min (ref >60)
Glucose, Bld: 134 mg/dL — ABNORMAL HIGH (ref 65–99)
Potassium: 3.6 mmol/L (ref 3.5–5.1)
Sodium: 135 mmol/L (ref 135–145)

## 2016-04-10 LAB — PREPARE RBC (CROSSMATCH)

## 2016-04-10 LAB — ABO/RH: ABO/RH(D): O NEG

## 2016-04-10 LAB — OCCULT BLOOD X 1 CARD TO LAB, STOOL: Fecal Occult Bld: NEGATIVE

## 2016-04-10 MED ORDER — PANTOPRAZOLE SODIUM 40 MG PO TBEC
40.0000 mg | DELAYED_RELEASE_TABLET | Freq: Every day | ORAL | Status: DC
  Administered 2016-04-10 – 2016-04-15 (×6): 40 mg via ORAL
  Filled 2016-04-10 (×7): qty 1

## 2016-04-10 MED ORDER — SODIUM CHLORIDE 0.9 % IV SOLN
Freq: Once | INTRAVENOUS | Status: AC
  Administered 2016-04-10: 18:00:00 via INTRAVENOUS

## 2016-04-10 MED ORDER — FERROUS SULFATE 325 (65 FE) MG PO TABS
325.0000 mg | ORAL_TABLET | Freq: Three times a day (TID) | ORAL | Status: DC
  Administered 2016-04-10 – 2016-04-15 (×13): 325 mg via ORAL
  Filled 2016-04-10 (×13): qty 1

## 2016-04-10 NOTE — Progress Notes (Signed)
Subjective:  Patient denies any chest pain or shortness of breath is present. Complains of left foot pain. States had dark stools last night noted to have a drop in hemoglobin further to 7.3. Patient does give history of exertional dyspnea and feeling tired fatigued with generalized weakness which has progressively gone worst over last 1 year.  Objective:  Vital Signs in the last 24 hours: Temp:  [98.2 F (36.8 C)-98.7 F (37.1 C)] 98.2 F (36.8 C) (03/04 0502) Pulse Rate:  [79-99] 99 (03/04 0502) Resp:  [15-18] 18 (03/04 0502) BP: (127-152)/(58-70) 152/70 (03/04 0502) SpO2:  [97 %-99 %] 97 % (03/04 0502) Weight:  [104 lb 14.4 oz (47.6 kg)] 104 lb 14.4 oz (47.6 kg) (03/04 0649)  Intake/Output from previous day: 03/03 0701 - 03/04 0700 In: 360 [P.O.:360] Out: 1800 [Urine:1800] Intake/Output from this shift: No intake/output data recorded.  Physical Exam: Neck: no adenopathy, no carotid bruit, no JVD and supple, symmetrical, trachea midline Lungs: clear to auscultation bilaterally Heart: regular rate and rhythm, S1, S2 normal and 2/6 systolic murmur noted Abdomen: soft, non-tender; bowel sounds normal; no masses,  no organomegaly Extremities: extremities normal, atraumatic, no cyanosis or edema  Lab Results:  Recent Labs  04/09/16 0325 04/10/16 0234  WBC 10.5 12.9*  HGB 8.9* 7.3*  PLT 288 262    Recent Labs  04/09/16 0325 04/10/16 0234  NA 135 135  K 3.1* 3.6  CL 97* 105  CO2 25 23  GLUCOSE 118* 134*  BUN 20 29*  CREATININE 0.78 0.69   No results for input(s): TROPONINI in the last 72 hours.  Invalid input(s): CK, MB Hepatic Function Panel No results for input(s): PROT, ALBUMIN, AST, ALT, ALKPHOS, BILITOT, BILIDIR, IBILI in the last 72 hours. No results for input(s): CHOL in the last 72 hours. No results for input(s): PROTIME in the last 72 hours.  Imaging: Imaging results have been reviewed and [place] Aorta  Result Date: 04/08/2016 CLINICAL DATA:  Aortic  aneurysm. Patient had procedure in cath lab today. EXAM: ULTRASOUND OF ABDOMINAL AORTA TECHNIQUE: Ultrasound examination of the abdominal aorta was performed to evaluate for abdominal aortic aneurysm. COMPARISON:  None. FINDINGS: Abdominal Aorta The aortic not be identified due to extensive overlying bowel gas. IMPRESSION: Nonvisualization of the aorta due to overlying bowel gas. Assessment for aortic aneurysm would be better assessed with CT given these limitations. Electronically Signed   By: Tollie Ethavid  Kwon M.D.   On: 04/08/2016 17:40    Cardiac Studies:  Assessment/Plan:  Symptomatic severe peripheral vascular disease with left heel ulcer status post bilateral iliac stenting Multivessel CAD Exertional dyspnea be angina equivalent Hypertension COPD with pulmonary fibrosis Protein calorie malnutrition Acute on chronic anemia probably secondary to blood loss during the procedure and hydration rule out GI loss. Plan Check stool for occult blood GI consult Recheck CBC and labs in a.m.   LOS: 4 days    Rinaldo CloudHarwani, Bernard Slayden 04/10/2016, 9:52 AM

## 2016-04-10 NOTE — Consult Note (Signed)
Unassigned patient Reason for Consult: Sudden drop in hemoglobin. Referring Physician: Dr. Charolette Forward Caitlin Vasquez is an 71 y.o. female.  HPI: [name] is a 70 year old white female, with a history of severe symptomatic peripheral vascular disease a left heel ulcer status post bilateral iliac stenting. Her medical history is also complicated by multiple vessel coronary artery disease and Hypertension,  COPD and pulmonary fibrosis. She is found to have a drop in hemoglobin from 8.927 g/dL after the iliac stenting. Patient denies having any abdominal pain nausea vomiting diarrhea constipation. Is no history of melena or hematochezia. She's tested guaiac negative on 2 occasions today. She gives a history of having a colonoscopy 2-3 years ago by a gastric drug use in [place] but cannot remember his name. Reportedly the colonoscopy was normal and she was advised to have a repeat exam in 10 years.  Past Medical History:  Diagnosis Date  . COPD (chronic obstructive pulmonary disease) ([place])   . High cholesterol   . On home oxygen therapy    "2L; all the time" (04/06/2016)  . Pneumonia    "when I was a kid"   . Urinary hesitancy    Past Surgical History:  Procedure Laterality Date  . ABDOMINAL AORTOGRAM N/A 04/08/2016   Procedure: Abdominal Aortogram;  Surgeon: Elam Dutch, MD;  Location: [place] [facility];  Service: Cardiovascular;  Laterality: N/A;  . CARDIAC CATHETERIZATION N/A 11/28/2014   Procedure: Right/Left Heart Cath and Coronary Angiography;  Surgeon: Dixie Dials, MD;  Location: [place] [facility];  Service: Cardiovascular;  Laterality: N/A;  . CARDIAC CATHETERIZATION  08/2006   Archie Endo 08/25/2006  . JOINT REPLACEMENT    . LOWER EXTREMITY ANGIOGRAPHY Bilateral 04/08/2016   Procedure: Lower Extremity Angiography;  Surgeon: Elam Dutch, MD;  Location: [place] [facility];  Service: Cardiovascular;  Laterality: Bilateral;  . PERIPHERAL VASCULAR INTERVENTION Bilateral  04/08/2016   Procedure: Peripheral Vascular Intervention;  Surgeon: Elam Dutch, MD;  Location: [place] [facility];  Service: Cardiovascular;  Laterality: Bilateral;  common iliac  . TOTAL HIP ARTHROPLASTY Left 01/29/2016   Family History  Problem Relation Age of Onset  . Heart attack Father    Social History:  reports that she quit smoking about 2 years ago. Her smoking use included Cigarettes. She has a 55.00 pack-year smoking history. She has never used smokeless tobacco. She reports that she does not drink alcohol or use drugs.  Allergies:  Allergies  Allergen Reactions  . Clonidine Derivatives Other (See Comments)    hypotension   Medications: I have reviewed the patient's current medications.  Results for orders placed or performed during the hospital encounter of 04/06/16 (from the past 48 hour(s))  POCT Activated clotting time     Status: None   Collection Time: 04/08/16 10:55 AM  Result Value Ref Range   Activated Clotting Time 202 seconds  POCT Activated clotting time     Status: None   Collection Time: 04/08/16 11:46 AM  Result Value Ref Range   Activated Clotting Time 175 seconds  Basic metabolic panel     Status: Abnormal   Collection Time: 04/09/16  3:25 AM  Result Value Ref Range   Sodium 135 135 - 145 mmol/L   Potassium 3.1 (L) 3.5 - 5.1 mmol/L   Chloride 97 (L) 101 - 111 mmol/L   CO2 25 22 - 32 mmol/L   Glucose, Bld 118 (H) 65 - 99 mg/dL   BUN 20 6 - 20  mg/dL   Creatinine, Ser 0.78 0.44 - 1.00 mg/dL   Calcium 8.7 (L) 8.9 - 10.3 mg/dL   GFR calc non Af Amer >60 >60 mL/min   GFR calc Af Amer >60 >60 mL/min    Comment: (NOTE) The eGFR has been calculated using the CKD EPI equation. This calculation has not been validated in all clinical situations. eGFR's persistently <60 mL/min signify possible Chronic Kidney Disease.    Anion gap 13 5 - 15  CBC     Status: Abnormal   Collection Time: 04/09/16  3:25 AM  Result Value Ref Range   WBC 10.5 4.0 - 10.5  K/uL   RBC 3.45 (L) 3.87 - 5.11 MIL/uL   Hemoglobin 8.9 (L) 12.0 - 15.0 g/dL   HCT 27.9 (L) 36.0 - 46.0 %   MCV 80.9 78.0 - 100.0 fL   MCH 25.8 (L) 26.0 - 34.0 pg   MCHC 31.9 30.0 - 36.0 g/dL   RDW 14.3 11.5 - 15.5 %   Platelets 288 150 - 400 K/uL  Basic metabolic panel     Status: Abnormal   Collection Time: 04/10/16  2:34 AM  Result Value Ref Range   Sodium 135 135 - 145 mmol/L   Potassium 3.6 3.5 - 5.1 mmol/L   Chloride 105 101 - 111 mmol/L   CO2 23 22 - 32 mmol/L   Glucose, Bld 134 (H) 65 - 99 mg/dL   BUN 29 (H) 6 - 20 mg/dL   Creatinine, Ser 0.69 0.44 - 1.00 mg/dL   Calcium 8.5 (L) 8.9 - 10.3 mg/dL   GFR calc non Af Amer >60 >60 mL/min   GFR calc Af Amer >60 >60 mL/min    Comment: (NOTE) The eGFR has been calculated using the CKD EPI equation. This calculation has not been validated in all clinical situations. eGFR's persistently <60 mL/min signify possible Chronic Kidney Disease.    Anion gap 7 5 - 15  CBC     Status: Abnormal   Collection Time: 04/10/16  2:34 AM  Result Value Ref Range   WBC 12.9 (H) 4.0 - 10.5 K/uL   RBC 2.78 (L) 3.87 - 5.11 MIL/uL   Hemoglobin 7.3 (L) 12.0 - 15.0 g/dL   HCT 22.7 (L) 36.0 - 46.0 %   MCV 81.7 78.0 - 100.0 fL   MCH 26.3 26.0 - 34.0 pg   MCHC 32.2 30.0 - 36.0 g/dL   RDW 14.2 11.5 - 15.5 %   Platelets 262 150 - 400 K/uL   US Aorta  Result Date: 04/08/2016 CLINICAL DATA:  Aortic aneurysm. Patient had procedure in cath lab today. EXAM: ULTRASOUND OF ABDOMINAL AORTA TECHNIQUE: Ultrasound examination of the abdominal aorta was performed to evaluate for abdominal aortic aneurysm. COMPARISON:  None. FINDINGS: Abdominal Aorta The aortic not be identified due to extensive overlying bowel gas. IMPRESSION: Nonvisualization of the aorta due to overlying bowel gas. Assessment for aortic aneurysm would be better assessed with CT given these limitations. Electronically Signed   By: Ashley Royalty M.D.   On: 04/08/2016 17:40   ROS Blood pressure (!)  152/70, pulse 99, temperature 98.2 F (36.8 C), temperature source Oral, resp. rate 18, height 5' 4"  (1.626 m), weight 47.6 kg (104 lb 14.4 oz), SpO2 97 %. Physical Exam  Constitutional: She is oriented to person, place, and time. Vital signs are normal. She is cooperative.  Thin and frail elderly female  HENT:  Head: Normocephalic and atraumatic.  Eyes: Conjunctivae and EOM are normal. Pupils  are equal, round, and reactive to light.  Neck: Normal range of motion. Neck supple.  Cardiovascular: Normal rate and regular rhythm.   Respiratory: Effort normal and breath sounds normal.  GI: Soft. Bowel sounds are normal.  Musculoskeletal: Normal range of motion.  Neurological: She is alert and oriented to person, place, and time.  Skin: Skin is warm and dry.  Psychiatric: She has a normal mood and affect. Her behavior is normal. Judgment and thought content normal.   Assessment/Plan: 1) Anemia with guaiac negative stools I do not think patient needs a GI workup at this time. I have discussed this with Dr. Her money over the phone. If the patient develops any further GI symptoms or blood in stool endoscopic interventions will be considered.  2) Severe symptomatic peripheral vascular disease status post bilateral iliac stenting. 3) COPD with pulmonary fibrosis on home oxygen. 4) Protein calorie malnutrition.  5) HTN/CAD. Mohsen Odenthal 04/10/2016, 10:41 AM

## 2016-04-10 NOTE — Consult Note (Signed)
[facility] [place]  Subjective  - feels ok   Objective 117/61 91 98.1 F (36.7 C) (Oral) 20 93%  Intake/Output Summary (Last 24 hours) at 04/10/16 1421 Last data filed at 04/10/16 1300  Gross per 24 hour  Intake              600 ml  Output              800 ml  Net             -200 ml   Groins soft 2+ femorals Left foot unchanged with ulcer  Assessment/Planning: Anemia workup in progress by Dr Sharyn LullHarwani would have low threshold to transfuse as she is scheduled for fem pop Tuesday  Severe protein calorie malnutrition encouraged pt to drink 4 cans ensure daily as well as eating meals  Fabienne BrunsFields, [place] 04/10/2016 2:21 PM --  Laboratory Lab Results:  Recent Labs  04/10/16 0234 04/10/16 1026  WBC 12.9* 10.6*  HGB 7.3* 7.0*  HCT 22.7* 21.5*  PLT 262 221   BMET  Recent Labs  04/09/16 0325 04/10/16 0234  NA 135 135  K 3.1* 3.6  CL 97* 105  CO2 25 23  GLUCOSE 118* 134*  BUN 20 29*  CREATININE 0.78 0.69  CALCIUM 8.7* 8.5*    COAG Lab Results  Component Value Date   INR 1.03 11/28/2014   No results found for: PTT

## 2016-04-11 DIAGNOSIS — E440 Moderate protein-calorie malnutrition: Secondary | ICD-10-CM | POA: Insufficient documentation

## 2016-04-11 LAB — BASIC METABOLIC PANEL
Anion gap: 5 (ref 5–15)
BUN: 19 mg/dL (ref 6–20)
CO2: 25 mmol/L (ref 22–32)
Calcium: 8.2 mg/dL — ABNORMAL LOW (ref 8.9–10.3)
Chloride: 106 mmol/L (ref 101–111)
Creatinine, Ser: 0.64 mg/dL (ref 0.44–1.00)
GFR calc Af Amer: >60 mL/min (ref >60)
GFR calc non Af Amer: >60 mL/min (ref >60)
Glucose, Bld: 108 mg/dL — ABNORMAL HIGH (ref 65–99)
Potassium: 3.3 mmol/L — ABNORMAL LOW (ref 3.5–5.1)
Sodium: 136 mmol/L (ref 135–145)

## 2016-04-11 LAB — CBC
HCT: 23.2 % — ABNORMAL LOW (ref 36.0–46.0)
Hemoglobin: 7.7 g/dL — ABNORMAL LOW (ref 12.0–15.0)
MCH: 27.5 pg (ref 26.0–34.0)
MCHC: 33.2 g/dL (ref 30.0–36.0)
MCV: 82.9 fL (ref 78.0–100.0)
Platelets: 210 10*3/uL (ref 150–400)
RBC: 2.8 MIL/uL — ABNORMAL LOW (ref 3.87–5.11)
RDW: 13.8 % (ref 11.5–15.5)
WBC: 9.2 10*3/uL (ref 4.0–10.5)

## 2016-04-11 LAB — PREPARE RBC (CROSSMATCH)

## 2016-04-11 MED ORDER — SODIUM CHLORIDE 0.9 % IV SOLN
Freq: Once | INTRAVENOUS | Status: AC
  Administered 2016-04-11: 11:00:00 via INTRAVENOUS

## 2016-04-11 MED ORDER — DEXTROSE 5 % IV SOLN
1.5000 g | INTRAVENOUS | Status: AC
  Filled 2016-04-11: qty 1.5

## 2016-04-11 MED ORDER — HYDROCODONE-ACETAMINOPHEN 5-325 MG PO TABS
1.0000 | ORAL_TABLET | ORAL | Status: DC | PRN
  Administered 2016-04-11 – 2016-04-14 (×8): 1 via ORAL
  Filled 2016-04-11 (×8): qty 1

## 2016-04-11 MED ORDER — POTASSIUM CHLORIDE CRYS ER 10 MEQ PO TBCR
10.0000 meq | EXTENDED_RELEASE_TABLET | Freq: Three times a day (TID) | ORAL | Status: AC
  Administered 2016-04-11 – 2016-04-13 (×8): 10 meq via ORAL
  Filled 2016-04-11 (×12): qty 1

## 2016-04-11 NOTE — Progress Notes (Signed)
Unassigned patient Subjective: Since I last evaluated the patient, she has been stable from a GI standpoint. She denies having any abdominal pain, nausea or vomiting. I was able to locate a colonoscopy report on her from 2015 that revealed sigmoid diverticulosis and hyperplastic polyps were removed.   Objective: Vital signs in last 24 hours: Temp:  [97.6 F (36.4 C)-98.4 F (36.9 C)] 97.9 F (36.6 C) (03/05 1345) Pulse Rate:  [81-95] 83 (03/05 1345) Resp:  [18-20] 18 (03/05 1345) BP: (110-142)/(47-58) 132/57 (03/05 1345) SpO2:  [95 %-100 %] 99 % (03/05 1345) Weight:  [47.1 kg (103 lb 12.8 oz)] 47.1 kg (103 lb 12.8 oz) (03/05 0502) Last BM Date: 04/11/16  Intake/Output from previous day: 03/04 0701 - 03/05 0700 In: 1900 [P.O.:1100; Blood:400] Out: 2360 [Urine:2360] Intake/Output this shift: Total I/O In: 860 [P.O.:500; Blood:360] Out: -   General appearance: alert, cooperative and no distress Resp: clear to auscultation bilaterally Cardio: regular rate and rhythm, S1, S2 normal, no murmur, click, rub or gallop GI: soft, non-tender; bowel sounds normal; no masses,  no organomegaly  Lab Results:  Recent Labs  04/10/16 0234 04/10/16 1026 04/11/16 0136  WBC 12.9* 10.6* 9.2  HGB 7.3* 7.0* 7.7*  HCT 22.7* 21.5* 23.2*  PLT 262 221 210   BMET  Recent Labs  04/09/16 0325 04/10/16 0234 04/11/16 0136  NA 135 135 136  K 3.1* 3.6 3.3*  CL 97* 105 106  CO2 25 23 25   GLUCOSE 118* 134* 108*  BUN 20 29* 19  CREATININE 0.78 0.69 0.64  CALCIUM 8.7* 8.5* 8.2*   Medications: I have reviewed the patient's current medications.  Assessment/Plan: 1) Anemia with guaiac negative stools-As discussed with Dr. Sharyn LullHarwani, I do not feel she needs a GI workup at this time. Please call if further assistance is needed. 2) Sigmoid diverticulosis.  3) PVD s/p bilateral iliac stenting-for fem pop surgery tomorrow..  LOS: 5 days   Caitlin Vasquez 04/11/2016, 4:01 PM

## 2016-04-11 NOTE — Progress Notes (Addendum)
[facility] [place]  Subjective  - Pain issues with left LE   Objective (!) 139/56 83 97.7 F (36.5 C) (Oral) 18 98%  Intake/Output Summary (Last 24 hours) at 04/11/16 0745 Last data filed at 04/11/16 0503  Gross per 24 hour  Intake             1900 ml  Output             2360 ml  Net             -460 ml   B feet warm Left heel ulcer covered Right foot warm with intact sensation and active range of motion   Assessment/Planning: POD # 3  #1 left common iliac occlusionstented with 9 x 59 I cast to 0 residual stenosis  #2 70% stenosis right common iliac stenting with 9 x 59 I cast 20 residual stenosis Plan left fem-pop by pass tomorrow 04/12/2016 NPO past MN  COLLINS, EMMA MAUREEN 04/11/2016 7:45 AM --  Laboratory Lab Results:  Recent Labs  04/10/16 1026 04/11/16 0136  WBC 10.6* 9.2  HGB 7.0* 7.7*  HCT 21.5* 23.2*  PLT 221 210   BMET  Recent Labs  04/10/16 0234 04/11/16 0136  NA 135 136  K 3.6 3.3*  CL 105 106  CO2 23 25  GLUCOSE 134* 108*  BUN 29* 19  CREATININE 0.69 0.64  CALCIUM 8.5* 8.2*    COAG Lab Results  Component Value Date   INR 1.03 11/28/2014   No results found for: PTT  Addendum  I have independently interviewed and examined the patient, and I agree with the physician assistant's findings.  I discussed the need for L femoropopliteal bypass with the patient.  I suspect she is likely to losing the L heel without proceeding with bypass.  Pt's GSV appears to be marginal so PTFE might be needed to reconstruct her.   Needs to be transfused 2 u pRBC.  Preferrably before proceeding with OR, otherwise, I suspect Anesthesia is going to transfuse her at the beginning of the case, when her BP plummets during induction.  I already discussed the risks of blood transfusion with the patient.  She agrees to accept blood.  Will defer to Dr. Algie CofferKadakia whether he will transfuse before.  AK target looks marginal.  Might try  anyway given her GSV is on the small side.  If the AK target is poor, which is likely, will go to BK pop.  The risk, benefits, and alternative for bypass operations were discussed with the patient.    The patient is aware the risks include but are not limited to: bleeding, infection, myocardial infarction, stroke, limb loss, nerve damage, need for additional procedures in the future, wound complications, and inability to complete the bypass.   We discussed her poor PO intake.  I suspect she has severe protein malnutrition. Will draw some nutrition labs and try to get her to supplement her intake, as if she fails to heal she is going to lose her leg anyway when the bypass graft gets infected from her surgical incisions that never healed.  The patient is aware of these risks and agreed to proceed.  The patient is scheduled for tomorrow.   Leonides SakeBrian Chen, MD, FACS [facility] [place] Office: [phone] Pager: [phone]  04/11/2016, 8:29 AM

## 2016-04-11 NOTE — Progress Notes (Signed)
Ref: Derian Dimalanta S, MD   Subjective:  Left leg pain continues. Hgb marginally improved with 1 unit of PRBC. Awaiting vascular surgery in AM.  Objective:  Vital Signs in the last 24 hours: Temp:  [97.6 F (36.4 C)-98.4 F (36.9 C)] 97.7 F (36.5 C) (03/05 0502) Pulse Rate:  [81-95] 83 (03/05 0502) Cardiac Rhythm: Normal sinus rhythm (03/05 0846) Resp:  [18-20] 18 (03/05 0502) BP: (117-142)/(53-61) 139/56 (03/05 0502) SpO2:  [93 %-100 %] 98 % (03/05 0502) Weight:  [47.1 kg (103 lb 12.8 oz)] 47.1 kg (103 lb 12.8 oz) (03/05 0502)  Physical Exam: BP Readings from Last 1 Encounters:  04/11/16 (!) 139/56    Wt Readings from Last 1 Encounters:  04/11/16 47.1 kg (103 lb 12.8 oz)    Weight change: -0.499 kg (-1 lb 1.6 oz) Body mass index is 17.82 kg/m. HEENT: [place]/AT, Eyes-Hazel, PERL, EOMI, Conjunctiva-Pink, Sclera-Non-icteric Neck: No JVD, No bruit, Trachea midline. Lungs:  Clearing, Bilateral. Cardiac:  Regular rhythm, normal S1 and S2, no S3. II/VI systolic murmur. Abdomen:  Soft, non-tender. BS present. Extremities:  Trace edema present. No cyanosis. No clubbing. CNS: AxOx3, Cranial nerves grossly intact, moves all 4 extremities.  Skin: Warm and dry.   Intake/Output from previous day: 03/04 0701 - 03/05 0700 In: 1900 [P.O.:1100; Blood:400] Out: 2360 [Urine:2360]    Lab Results: BMET    Component Value Date/Time   NA 136 04/11/2016 0136   NA 135 04/10/2016 0234   NA 135 04/09/2016 0325   K 3.3 (L) 04/11/2016 0136   K 3.6 04/10/2016 0234   K 3.1 (L) 04/09/2016 0325   CL 106 04/11/2016 0136   CL 105 04/10/2016 0234   CL 97 (L) 04/09/2016 0325   CO2 25 04/11/2016 0136   CO2 23 04/10/2016 0234   CO2 25 04/09/2016 0325   GLUCOSE 108 (H) 04/11/2016 0136   GLUCOSE 134 (H) 04/10/2016 0234   GLUCOSE 118 (H) 04/09/2016 0325   BUN 19 04/11/2016 0136   BUN 29 (H) 04/10/2016 0234   BUN 20 04/09/2016 0325   CREATININE 0.64 04/11/2016 0136   CREATININE 0.69 04/10/2016 0234    CREATININE 0.78 04/09/2016 0325   CALCIUM 8.2 (L) 04/11/2016 0136   CALCIUM 8.5 (L) 04/10/2016 0234   CALCIUM 8.7 (L) 04/09/2016 0325   GFRNONAA >60 04/11/2016 0136   GFRNONAA >60 04/10/2016 0234   GFRNONAA >60 04/09/2016 0325   GFRAA >60 04/11/2016 0136   GFRAA >60 04/10/2016 0234   GFRAA >60 04/09/2016 0325   CBC    Component Value Date/Time   WBC 9.2 04/11/2016 0136   RBC 2.80 (L) 04/11/2016 0136   HGB 7.7 (L) 04/11/2016 0136   HCT 23.2 (L) 04/11/2016 0136   PLT 210 04/11/2016 0136   MCV 82.9 04/11/2016 0136   MCH 27.5 04/11/2016 0136   MCHC 33.2 04/11/2016 0136   RDW 13.8 04/11/2016 0136   LYMPHSABS 1.5 04/06/2016 1456   MONOABS 0.5 04/06/2016 1456   EOSABS 0.6 04/06/2016 1456   BASOSABS 0.0 04/06/2016 1456   HEPATIC Function Panel  Recent Labs  04/06/16 1456  PROT 7.7   HEMOGLOBIN A1C No components found for: HGA1C,  MPG CARDIAC ENZYMES Lab Results  Component Value Date   TROPONINI <0.03 04/07/2016   TROPONINI <0.03 04/06/2016   TROPONINI <0.03 04/06/2016   BNP No results for input(s): PROBNP in the last 8760 hours. TSH No results for input(s): TSH in the last 8760 hours. CHOLESTEROL No results for input(s): CHOL in the last  8760 hours.  Scheduled Meds: . sodium chloride   Intravenous Once  . amLODipine  10 mg Oral Daily  . aspirin EC  81 mg Oral Daily  . atorvastatin  20 mg Oral Once per day on Mon Wed Fri  . carvedilol  12.5 mg Oral BID WC  . cefUROXime (ZINACEF)  IV  1.5 g Intravenous To SSTC  . collagenase   Topical Daily  . feeding supplement (ENSURE ENLIVE)  237 mL Oral BID BM  . ferrous sulfate  325 mg Oral TID WC  . mouth rinse  15 mL Mouth Rinse BID  . pantoprazole  40 mg Oral Q0600  . potassium chloride  10 mEq Oral TID  . spironolactone  12.5 mg Oral Daily   Continuous Infusions: . 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 Await vascular surgery in AM.   LOS: 5 days    [name]  MD  04/11/2016, 9:29 AM

## 2016-04-11 NOTE — Care Management Important Message (Signed)
Important Message  Patient Details  Name: Caitlin Vasquez J Bowland MRN: [id number] Date of Birth: 1946-01-05   Medicare Important Message Given:  Yes    Kyla BalzarineShealy, Jonai Weyland Abena 04/11/2016, 4:33 PM

## 2016-04-12 ENCOUNTER — Inpatient Hospital Stay (HOSPITAL_COMMUNITY): Payer: Medicare Other | Admitting: Certified Registered Nurse Anesthetist

## 2016-04-12 ENCOUNTER — Encounter (HOSPITAL_COMMUNITY)
Admission: AD | Disposition: A | Discharge status: Home / Self Care | Payer: Self-pay | Source: Ambulatory Visit | Attending: Cardiovascular Disease

## 2016-04-12 DIAGNOSIS — I70244 Atherosclerosis of native arteries of left leg with ulceration of heel and midfoot: Secondary | ICD-10-CM

## 2016-04-12 HISTORY — PX: FEMORAL-POPLITEAL BYPASS GRAFT: SHX937

## 2016-04-12 HISTORY — PX: VEIN HARVEST: SHX6363

## 2016-04-12 HISTORY — PX: DRESSING CHANGE UNDER ANESTHESIA: SHX5237

## 2016-04-12 LAB — HEPATIC FUNCTION PANEL
ALT: 14 U/L (ref 14–54)
AST: 15 U/L (ref 15–41)
Albumin: 3 g/dL — ABNORMAL LOW (ref 3.5–5.0)
Alkaline Phosphatase: 58 U/L (ref 38–126)
Bilirubin, Direct: 0.1 mg/dL (ref 0.1–0.5)
Indirect Bilirubin: 0.5 mg/dL (ref 0.3–0.9)
Total Bilirubin: 0.6 mg/dL (ref 0.3–1.2)
Total Protein: 5.7 g/dL — ABNORMAL LOW (ref 6.5–8.1)

## 2016-04-12 LAB — BASIC METABOLIC PANEL
Anion gap: 7 (ref 5–15)
BUN: 10 mg/dL (ref 6–20)
CO2: 24 mmol/L (ref 22–32)
Calcium: 8.5 mg/dL — ABNORMAL LOW (ref 8.9–10.3)
Chloride: 105 mmol/L (ref 101–111)
Creatinine, Ser: 0.63 mg/dL (ref 0.44–1.00)
GFR calc Af Amer: >60 mL/min (ref >60)
GFR calc non Af Amer: >60 mL/min (ref >60)
Glucose, Bld: 98 mg/dL (ref 65–99)
Potassium: 3.9 mmol/L (ref 3.5–5.1)
Sodium: 136 mmol/L (ref 135–145)

## 2016-04-12 LAB — PREALBUMIN: Prealbumin: 17.8 mg/dL — ABNORMAL LOW (ref 18–38)

## 2016-04-12 LAB — CBC
HCT: 29.1 % — ABNORMAL LOW (ref 36.0–46.0)
Hemoglobin: 9.6 g/dL — ABNORMAL LOW (ref 12.0–15.0)
MCH: 27 pg (ref 26.0–34.0)
MCHC: 33 g/dL (ref 30.0–36.0)
MCV: 81.7 fL (ref 78.0–100.0)
Platelets: 209 10*3/uL (ref 150–400)
RBC: 3.56 MIL/uL — ABNORMAL LOW (ref 3.87–5.11)
RDW: 14.6 % (ref 11.5–15.5)
WBC: 8.6 10*3/uL (ref 4.0–10.5)

## 2016-04-12 LAB — PROTIME-INR
INR: 1.11
Prothrombin Time: 14.3 seconds (ref 11.4–15.2)

## 2016-04-12 LAB — SURGICAL PCR SCREEN
MRSA, PCR: NEGATIVE
Staphylococcus aureus: POSITIVE — AB

## 2016-04-12 LAB — C-REACTIVE PROTEIN: CRP: <0.8 mg/dL (ref <1.0)

## 2016-04-12 SURGERY — BYPASS GRAFT FEMORAL-POPLITEAL ARTERY
Anesthesia: General | Site: Leg Upper | Laterality: Left

## 2016-04-12 MED ORDER — HEPARIN SODIUM (PORCINE) 5000 UNIT/ML IJ SOLN
INTRAMUSCULAR | Status: DC | PRN
  Administered 2016-04-12: 10:00:00

## 2016-04-12 MED ORDER — PHENYLEPHRINE 40 MCG/ML (10ML) SYRINGE FOR IV PUSH (FOR BLOOD PRESSURE SUPPORT)
PREFILLED_SYRINGE | INTRAVENOUS | Status: AC
  Filled 2016-04-12: qty 20

## 2016-04-12 MED ORDER — ENOXAPARIN SODIUM 30 MG/0.3ML ~~LOC~~ SOLN
30.0000 mg | SUBCUTANEOUS | Status: DC
  Administered 2016-04-13 – 2016-04-15 (×3): 30 mg via SUBCUTANEOUS
  Filled 2016-04-12 (×3): qty 0.3

## 2016-04-12 MED ORDER — MIDAZOLAM HCL 2 MG/2ML IJ SOLN
INTRAMUSCULAR | Status: AC
  Filled 2016-04-12: qty 2

## 2016-04-12 MED ORDER — FENTANYL CITRATE (PF) 100 MCG/2ML IJ SOLN
INTRAMUSCULAR | Status: DC | PRN
  Administered 2016-04-12: 100 ug via INTRAVENOUS
  Administered 2016-04-12: 50 ug via INTRAVENOUS
  Administered 2016-04-12 (×2): 25 ug via INTRAVENOUS
  Administered 2016-04-12: 50 ug via INTRAVENOUS
  Administered 2016-04-12 (×2): 25 ug via INTRAVENOUS

## 2016-04-12 MED ORDER — FENTANYL CITRATE (PF) 100 MCG/2ML IJ SOLN
25.0000 ug | INTRAMUSCULAR | Status: DC | PRN
  Administered 2016-04-12: 25 ug via INTRAVENOUS

## 2016-04-12 MED ORDER — ONDANSETRON HCL 4 MG/2ML IJ SOLN
INTRAMUSCULAR | Status: DC | PRN
  Administered 2016-04-12 (×2): 4 mg via INTRAVENOUS

## 2016-04-12 MED ORDER — FENTANYL CITRATE (PF) 100 MCG/2ML IJ SOLN
INTRAMUSCULAR | Status: AC
  Filled 2016-04-12: qty 2

## 2016-04-12 MED ORDER — DEXTROSE 5 % IV SOLN
INTRAVENOUS | Status: AC
  Filled 2016-04-12: qty 1.5

## 2016-04-12 MED ORDER — DOCUSATE SODIUM 100 MG PO CAPS
100.0000 mg | ORAL_CAPSULE | Freq: Every day | ORAL | Status: DC
  Administered 2016-04-13 – 2016-04-15 (×3): 100 mg via ORAL
  Filled 2016-04-12 (×3): qty 1

## 2016-04-12 MED ORDER — BISACODYL 5 MG PO TBEC: 5.0000 mg | DELAYED_RELEASE_TABLET | Freq: Every day | ORAL | Status: DC | PRN

## 2016-04-12 MED ORDER — FENTANYL CITRATE (PF) 100 MCG/2ML IJ SOLN
INTRAMUSCULAR | Status: AC
  Filled 2016-04-12: qty 4

## 2016-04-12 MED ORDER — METOPROLOL TARTRATE 5 MG/5ML IV SOLN: 2.0000 mg | INTRAVENOUS | Status: DC | PRN

## 2016-04-12 MED ORDER — HEPARIN SODIUM (PORCINE) 1000 UNIT/ML IJ SOLN
INTRAMUSCULAR | Status: DC | PRN
  Administered 2016-04-12: 6000 [IU] via INTRAVENOUS

## 2016-04-12 MED ORDER — ONDANSETRON HCL 4 MG/2ML IJ SOLN
INTRAMUSCULAR | Status: AC
  Filled 2016-04-12: qty 4

## 2016-04-12 MED ORDER — GUAIFENESIN-DM 100-10 MG/5ML PO SYRP: 15.0000 mL | ORAL_SOLUTION | ORAL | Status: DC | PRN

## 2016-04-12 MED ORDER — EPHEDRINE SULFATE-NACL 50-0.9 MG/10ML-% IV SOSY
PREFILLED_SYRINGE | INTRAVENOUS | Status: DC | PRN
  Administered 2016-04-12: 10 mg via INTRAVENOUS
  Administered 2016-04-12: 5 mg via INTRAVENOUS
  Administered 2016-04-12: 10 mg via INTRAVENOUS
  Administered 2016-04-12: 15 mg via INTRAVENOUS

## 2016-04-12 MED ORDER — LABETALOL HCL 5 MG/ML IV SOLN: 10.0000 mg | INTRAVENOUS | Status: DC | PRN

## 2016-04-12 MED ORDER — NEOSTIGMINE METHYLSULFATE 10 MG/10ML IV SOLN
INTRAVENOUS | Status: DC | PRN
  Administered 2016-04-12: 2 mg via INTRAVENOUS

## 2016-04-12 MED ORDER — SENNOSIDES-DOCUSATE SODIUM 8.6-50 MG PO TABS
1.0000 | ORAL_TABLET | Freq: Every evening | ORAL | Status: DC | PRN
  Filled 2016-04-12: qty 1

## 2016-04-12 MED ORDER — MUPIROCIN 2 % EX OINT
1.0000 "application " | TOPICAL_OINTMENT | Freq: Two times a day (BID) | CUTANEOUS | Status: DC
  Administered 2016-04-12 – 2016-04-15 (×8): 1 via NASAL
  Filled 2016-04-12: qty 22

## 2016-04-12 MED ORDER — MIDAZOLAM HCL 2 MG/2ML IJ SOLN
INTRAMUSCULAR | Status: DC | PRN
  Administered 2016-04-12: 1 mg via INTRAVENOUS

## 2016-04-12 MED ORDER — LIDOCAINE 2% (20 MG/ML) 5 ML SYRINGE
INTRAMUSCULAR | Status: DC | PRN
  Administered 2016-04-12: 40 mg via INTRAVENOUS

## 2016-04-12 MED ORDER — SODIUM CHLORIDE 0.9 % IV SOLN
INTRAVENOUS | Status: DC
  Administered 2016-04-12: 19:00:00 via INTRAVENOUS

## 2016-04-12 MED ORDER — MORPHINE SULFATE (PF) 2 MG/ML IV SOLN
2.0000 mg | INTRAVENOUS | Status: DC | PRN
  Administered 2016-04-12: 2 mg via INTRAVENOUS
  Administered 2016-04-13: 4 mg via INTRAVENOUS
  Administered 2016-04-13: 2 mg via INTRAVENOUS
  Filled 2016-04-12: qty 1
  Filled 2016-04-12: qty 2
  Filled 2016-04-12: qty 1

## 2016-04-12 MED ORDER — CLOPIDOGREL BISULFATE 75 MG PO TABS
75.0000 mg | ORAL_TABLET | Freq: Every day | ORAL | Status: DC
  Administered 2016-04-13 – 2016-04-15 (×3): 75 mg via ORAL
  Filled 2016-04-12 (×3): qty 1

## 2016-04-12 MED ORDER — 0.9 % SODIUM CHLORIDE (POUR BTL) OPTIME
TOPICAL | Status: DC | PRN
  Administered 2016-04-12: 2000 mL

## 2016-04-12 MED ORDER — ONDANSETRON HCL 4 MG/2ML IJ SOLN: 4.0000 mg | Freq: Four times a day (QID) | INTRAMUSCULAR | Status: DC | PRN

## 2016-04-12 MED ORDER — CHLORHEXIDINE GLUCONATE CLOTH 2 % EX PADS
6.0000 | MEDICATED_PAD | Freq: Every day | CUTANEOUS | Status: DC
  Administered 2016-04-12 – 2016-04-15 (×4): 6 via TOPICAL

## 2016-04-12 MED ORDER — OXYCODONE-ACETAMINOPHEN 5-325 MG PO TABS
1.0000 | ORAL_TABLET | ORAL | Status: DC | PRN
  Administered 2016-04-12 – 2016-04-14 (×7): 2 via ORAL
  Administered 2016-04-14: 1 via ORAL
  Administered 2016-04-14 – 2016-04-15 (×6): 2 via ORAL
  Filled 2016-04-12 (×10): qty 2
  Filled 2016-04-12: qty 1
  Filled 2016-04-12 (×3): qty 2

## 2016-04-12 MED ORDER — LIDOCAINE 2% (20 MG/ML) 5 ML SYRINGE
INTRAMUSCULAR | Status: AC
  Filled 2016-04-12: qty 5

## 2016-04-12 MED ORDER — DEXTROSE 5 % IV SOLN
INTRAVENOUS | Status: DC | PRN
  Administered 2016-04-12: 1.5 g via INTRAVENOUS

## 2016-04-12 MED ORDER — POTASSIUM CHLORIDE CRYS ER 20 MEQ PO TBCR: 20.0000 meq | EXTENDED_RELEASE_TABLET | Freq: Every day | ORAL | Status: DC | PRN

## 2016-04-12 MED ORDER — PROPOFOL 10 MG/ML IV BOLUS
INTRAVENOUS | Status: DC | PRN
  Administered 2016-04-12: 90 mg via INTRAVENOUS

## 2016-04-12 MED ORDER — ROCURONIUM BROMIDE 50 MG/5ML IV SOSY
PREFILLED_SYRINGE | INTRAVENOUS | Status: AC
  Filled 2016-04-12: qty 5

## 2016-04-12 MED ORDER — PROTAMINE SULFATE 10 MG/ML IV SOLN
INTRAVENOUS | Status: DC | PRN
  Administered 2016-04-12: 30 mg via INTRAVENOUS

## 2016-04-12 MED ORDER — CEFUROXIME SODIUM 1.5 G IJ SOLR
1.5000 g | Freq: Two times a day (BID) | INTRAMUSCULAR | Status: AC
  Administered 2016-04-13 (×2): 1.5 g via INTRAVENOUS
  Filled 2016-04-12 (×2): qty 1.5

## 2016-04-12 MED ORDER — DEXAMETHASONE SODIUM PHOSPHATE 10 MG/ML IJ SOLN
INTRAMUSCULAR | Status: DC | PRN
  Administered 2016-04-12: 10 mg via INTRAVENOUS

## 2016-04-12 MED ORDER — ONDANSETRON HCL 4 MG/2ML IJ SOLN
INTRAMUSCULAR | Status: AC
  Filled 2016-04-12: qty 2

## 2016-04-12 MED ORDER — ALUM & MAG HYDROXIDE-SIMETH 200-200-20 MG/5ML PO SUSP: 15.0000 mL | ORAL | Status: DC | PRN

## 2016-04-12 MED ORDER — PHENOL 1.4 % MT LIQD: 1.0000 | OROMUCOSAL | Status: DC | PRN

## 2016-04-12 MED ORDER — PROPOFOL 10 MG/ML IV BOLUS
INTRAVENOUS | Status: AC
  Filled 2016-04-12: qty 20

## 2016-04-12 MED ORDER — OXYCODONE HCL 5 MG PO TABS: 5.0000 mg | ORAL_TABLET | Freq: Once | ORAL | Status: DC | PRN

## 2016-04-12 MED ORDER — EPHEDRINE 5 MG/ML INJ
INTRAVENOUS | Status: AC
  Filled 2016-04-12: qty 20

## 2016-04-12 MED ORDER — LACTATED RINGERS IV SOLN
INTRAVENOUS | Status: DC
  Administered 2016-04-12 (×3): via INTRAVENOUS

## 2016-04-12 MED ORDER — SODIUM CHLORIDE 0.9 % IV SOLN: 500.0000 mL | Freq: Once | INTRAVENOUS | Status: DC | PRN

## 2016-04-12 MED ORDER — OXYCODONE HCL 5 MG/5ML PO SOLN: 5.0000 mg | Freq: Once | ORAL | Status: DC | PRN

## 2016-04-12 MED ORDER — GLYCOPYRROLATE 0.2 MG/ML IJ SOLN
INTRAMUSCULAR | Status: DC | PRN
  Administered 2016-04-12: 0.3 mg via INTRAVENOUS

## 2016-04-12 MED ORDER — ROCURONIUM BROMIDE 10 MG/ML (PF) SYRINGE
PREFILLED_SYRINGE | INTRAVENOUS | Status: DC | PRN
  Administered 2016-04-12: 40 mg via INTRAVENOUS

## 2016-04-12 MED ORDER — MUPIROCIN 2 % EX OINT
TOPICAL_OINTMENT | CUTANEOUS | Status: AC
  Administered 2016-04-12: 05:00:00
  Filled 2016-04-12: qty 22

## 2016-04-12 MED ORDER — PHENYLEPHRINE HCL 10 MG/ML IJ SOLN
INTRAMUSCULAR | Status: DC | PRN
  Administered 2016-04-12: 30 ug/min via INTRAVENOUS

## 2016-04-12 SURGICAL SUPPLY — 77 items
ADH SKN CLS APL DERMABOND .7 (GAUZE/BANDAGES/DRESSINGS) ×6
AGENT HMST SPONGE THK3/8 (HEMOSTASIS)
BAG ISL DRAPE 18X18 STRL (DRAPES) ×2
BAG ISOLATION DRAPE 18X18 (DRAPES) IMPLANT
BANDAGE ESMARK 6X9 LF (GAUZE/BANDAGES/DRESSINGS) IMPLANT
BNDG CMPR 9X6 STRL LF SNTH (GAUZE/BANDAGES/DRESSINGS)
BNDG ESMARK 6X9 LF (GAUZE/BANDAGES/DRESSINGS)
CANISTER SUCT 3000ML PPV (MISCELLANEOUS) ×3 IMPLANT
CLIP TI MEDIUM 24 (CLIP) ×3 IMPLANT
CLIP TI WIDE RED SMALL 24 (CLIP) ×4 IMPLANT
COVER PROBE W GEL 5X96 (DRAPES) ×3 IMPLANT
COVER SURGICAL LIGHT HANDLE (MISCELLANEOUS) ×1 IMPLANT
CUFF TOURNIQUET SINGLE 24IN (TOURNIQUET CUFF) IMPLANT
CUFF TOURNIQUET SINGLE 34IN LL (TOURNIQUET CUFF) IMPLANT
CUFF TOURNIQUET SINGLE 44IN (TOURNIQUET CUFF) IMPLANT
DERMABOND ADVANCED (GAUZE/BANDAGES/DRESSINGS) ×3
DERMABOND ADVANCED .7 DNX12 (GAUZE/BANDAGES/DRESSINGS) IMPLANT
DRAIN CHANNEL 15F RND FF W/TCR (WOUND CARE) IMPLANT
DRAPE C-ARM 42X72 X-RAY (DRAPES) ×2 IMPLANT
DRAPE ISOLATION BAG 18X18 (DRAPES) ×1
DRAPE WARM FLUID 44X44 (DRAPE) ×1 IMPLANT
DRESSING ALLEVYN LIFE SACRUM (GAUZE/BANDAGES/DRESSINGS) ×1 IMPLANT
DRSG MEPILEX BORDER 4X4 (GAUZE/BANDAGES/DRESSINGS) ×1 IMPLANT
DRSG MEPILEX BORDER 4X8 (GAUZE/BANDAGES/DRESSINGS) ×1 IMPLANT
ELECT REM PT RETURN 9FT ADLT (ELECTROSURGICAL) ×3
ELECTRODE REM PT RTRN 9FT ADLT (ELECTROSURGICAL) ×2 IMPLANT
EVACUATOR SILICONE 100CC (DRAIN) IMPLANT
GAUZE SPONGE 4X4 16PLY XRAY LF (GAUZE/BANDAGES/DRESSINGS) ×2 IMPLANT
GLOVE BIO SURGEON STRL SZ7 (GLOVE) ×3 IMPLANT
GLOVE BIOGEL PI IND STRL 6.5 (GLOVE) IMPLANT
GLOVE BIOGEL PI IND STRL 7.0 (GLOVE) IMPLANT
GLOVE BIOGEL PI IND STRL 7.5 (GLOVE) ×2 IMPLANT
GLOVE BIOGEL PI INDICATOR 6.5 (GLOVE) ×1
GLOVE BIOGEL PI INDICATOR 7.0 (GLOVE) ×3
GLOVE BIOGEL PI INDICATOR 7.5 (GLOVE) ×2
GLOVE ECLIPSE 6.5 STRL STRAW (GLOVE) ×1 IMPLANT
GLOVE ECLIPSE 7.0 STRL STRAW (GLOVE) ×1 IMPLANT
GLOVE SURG SS PI 6.5 STRL IVOR (GLOVE) ×2 IMPLANT
GOWN STRL REUS W/ TWL LRG LVL3 (GOWN DISPOSABLE) ×6 IMPLANT
GOWN STRL REUS W/TWL LRG LVL3 (GOWN DISPOSABLE) ×12
HEMOSTAT SPONGE AVITENE ULTRA (HEMOSTASIS) IMPLANT
INSERT FOGARTY SM (MISCELLANEOUS) IMPLANT
KIT BASIN OR (CUSTOM PROCEDURE TRAY) ×3 IMPLANT
KIT ROOM TURNOVER OR (KITS) ×3 IMPLANT
LIQUID BAND (GAUZE/BANDAGES/DRESSINGS) ×1 IMPLANT
LOOP VESSEL MAXI BLUE (MISCELLANEOUS) ×1 IMPLANT
MARKER GRAFT CORONARY BYPASS (MISCELLANEOUS) IMPLANT
NS IRRIG 1000ML POUR BTL (IV SOLUTION) ×6 IMPLANT
PACK CV ACCESS (CUSTOM PROCEDURE TRAY) ×1 IMPLANT
PACK UNIVERSAL I (CUSTOM PROCEDURE TRAY) ×1 IMPLANT
PAD ARMBOARD 7.5X6 YLW CONV (MISCELLANEOUS) ×6 IMPLANT
PADDING CAST COTTON 6X4 STRL (CAST SUPPLIES) IMPLANT
SET MICROPUNCTURE 5F STIFF (MISCELLANEOUS) IMPLANT
SPONGE INTESTINAL PEANUT (DISPOSABLE) ×1 IMPLANT
SPONGE LAP 18X18 X RAY DECT (DISPOSABLE) ×1 IMPLANT
SPONGE TONSIL 1 RF SGL (DISPOSABLE) ×1 IMPLANT
STOPCOCK 4 WAY LG BORE MALE ST (IV SETS) IMPLANT
SUT ETHILON 3 0 PS 1 (SUTURE) IMPLANT
SUT GORETEX 5 0 TT13 24 (SUTURE) IMPLANT
SUT GORETEX 6.0 TT13 (SUTURE) IMPLANT
SUT MNCRL AB 4-0 PS2 18 (SUTURE) ×7 IMPLANT
SUT PROLENE 5 0 C 1 24 (SUTURE) ×5 IMPLANT
SUT PROLENE 6 0 BV (SUTURE) ×2 IMPLANT
SUT PROLENE 6 0 CC (SUTURE) ×3 IMPLANT
SUT PROLENE 7 0 BV 1 (SUTURE) ×1 IMPLANT
SUT SILK 2 0 FS (SUTURE) IMPLANT
SUT SILK 2 0 SH (SUTURE) ×1 IMPLANT
SUT SILK 3 0 (SUTURE) ×3
SUT SILK 3-0 18XBRD TIE 12 (SUTURE) IMPLANT
SUT VIC AB 2-0 CT1 27 (SUTURE) ×3
SUT VIC AB 2-0 CT1 TAPERPNT 27 (SUTURE) ×4 IMPLANT
SUT VIC AB 3-0 SH 27 (SUTURE) ×6
SUT VIC AB 3-0 SH 27X BRD (SUTURE) ×6 IMPLANT
TRAY FOLEY W/METER SILVER 16FR (SET/KITS/TRAYS/PACK) ×3 IMPLANT
TUBING EXTENTION W/L.L. (IV SETS) IMPLANT
UNDERPAD 30X30 (UNDERPADS AND DIAPERS) ×3 IMPLANT
WATER STERILE IRR 1000ML POUR (IV SOLUTION) ×3 IMPLANT

## 2016-04-12 NOTE — Anesthesia Preprocedure Evaluation (Signed)
Anesthesia Evaluation  Patient identified by MRN, date of birth, ID band Patient awake    Reviewed: Allergy & Precautions, H&P , NPO status , Patient's Chart, lab work & pertinent test results  Airway Mallampati: II   Neck ROM: full    Dental   Pulmonary COPD,  oxygen dependent, former smoker,    breath sounds clear to auscultation       Cardiovascular hypertension, +CHF   Rhythm:regular Rate:Normal     Neuro/Psych    GI/Hepatic   Endo/Other    Renal/GU      Musculoskeletal   Abdominal   Peds  Hematology  (+) anemia ,   Anesthesia Other Findings   Reproductive/Obstetrics                             Anesthesia Physical Anesthesia Plan  ASA: III  Anesthesia Plan: General   Post-op Pain Management:    Induction: Intravenous  Airway Management Planned: Oral ETT  Additional Equipment:   Intra-op Plan:   Post-operative Plan: Extubation in OR  Informed Consent: I have reviewed the patients History and Physical, chart, labs and discussed the procedure including the risks, benefits and alternatives for the proposed anesthesia with the patient or authorized representative who has indicated his/her understanding and acceptance.     Plan Discussed with: CRNA, Anesthesiologist and Surgeon  Anesthesia Plan Comments:         Anesthesia Quick Evaluation

## 2016-04-12 NOTE — Interval H&P Note (Signed)
[facility] [place]  History and Physical Update  The patient was interviewed and re-examined.  The patient's previous History and Physical has been reviewed and is unchanged from my consult except for: interval B CIA PTA+S and diagnostic angiography.  Based on angiography, pt needs a L fem-pop bypass to heal her L heel ulcer.  The plan is L CFA to AK vs BK pop BPG with either ips GSV or propaten depending on adequacy of target and conduit, respectively.   Leonides SakeBrian Chen, MD [facility] [place] Office: [phone] Pager: [phone]  04/12/2016, 8:34 AM

## 2016-04-12 NOTE — Progress Notes (Signed)
Ref: Ricki RodriguezKADAKIA,Secret Kristensen S, MD   Subjective:  Feeling better. Hgb up to 9.6 and potassium up to 3.9 meq. Awaiting left leg bypass surgery.  Objective:  Vital Signs in the last 24 hours: Temp:  [97.7 F (36.5 C)-98.4 F (36.9 C)] 97.7 F (36.5 C) (03/06 0436) Pulse Rate:  [75-83] 79 (03/06 0859) Cardiac Rhythm: Normal sinus rhythm (03/06 0700) Resp:  [18-20] 18 (03/06 0436) BP: (110-132)/(47-66) 127/64 (03/06 0859) SpO2:  [98 %-100 %] 98 % (03/06 0436) Weight:  [46.9 kg (103 lb 6.4 oz)] 46.9 kg (103 lb 6.4 oz) (03/06 0436)  Physical Exam: BP Readings from Last 1 Encounters:  04/12/16 127/64    Wt Readings from Last 1 Encounters:  04/12/16 46.9 kg (103 lb 6.4 oz)    Weight change: -0.181 kg (-6.4 oz) Body mass index is 17.75 kg/m. HEENT: [place]/AT, Eyes-Hazel, PERL, EOMI, Conjunctiva-Pink, Sclera-Non-icteric Neck: No JVD, No bruit, Trachea midline. Lungs:  Clear, Bilateral. Cardiac:  Regular rhythm, normal S1 and S2, no S3. II/VI systolic murmur. Abdomen:  Soft, non-tender. BS present. Extremities:  No edema present. No cyanosis. No clubbing. CNS: AxOx3, Cranial nerves grossly intact, moves all 4 extremities.  Skin: Warm and dry.   Intake/Output from previous day: 03/05 0701 - 03/06 0700 In: 1340 [P.O.:980; Blood:360] Out: 1200 [Urine:1200]    Lab Results: BMET    Component Value Date/Time   NA 136 04/12/2016 0227   NA 136 04/11/2016 0136   NA 135 04/10/2016 0234   K 3.9 04/12/2016 0227   K 3.3 (L) 04/11/2016 0136   K 3.6 04/10/2016 0234   CL 105 04/12/2016 0227   CL 106 04/11/2016 0136   CL 105 04/10/2016 0234   CO2 24 04/12/2016 0227   CO2 25 04/11/2016 0136   CO2 23 04/10/2016 0234   GLUCOSE 98 04/12/2016 0227   GLUCOSE 108 (H) 04/11/2016 0136   GLUCOSE 134 (H) 04/10/2016 0234   BUN 10 04/12/2016 0227   BUN 19 04/11/2016 0136   BUN 29 (H) 04/10/2016 0234   CREATININE 0.63 04/12/2016 0227   CREATININE 0.64 04/11/2016 0136   CREATININE 0.69 04/10/2016 0234   CALCIUM 8.5 (L) 04/12/2016 0227   CALCIUM 8.2 (L) 04/11/2016 0136   CALCIUM 8.5 (L) 04/10/2016 0234   GFRNONAA >60 04/12/2016 0227   GFRNONAA >60 04/11/2016 0136   GFRNONAA >60 04/10/2016 0234   GFRAA >60 04/12/2016 0227   GFRAA >60 04/11/2016 0136   GFRAA >60 04/10/2016 0234   CBC    Component Value Date/Time   WBC 8.6 04/12/2016 0227   RBC 3.56 (L) 04/12/2016 0227   HGB 9.6 (L) 04/12/2016 0227   HCT 29.1 (L) 04/12/2016 0227   PLT 209 04/12/2016 0227   MCV 81.7 04/12/2016 0227   MCH 27.0 04/12/2016 0227   MCHC 33.0 04/12/2016 0227   RDW 14.6 04/12/2016 0227   LYMPHSABS 1.5 04/06/2016 1456   MONOABS 0.5 04/06/2016 1456   EOSABS 0.6 04/06/2016 1456   BASOSABS 0.0 04/06/2016 1456   HEPATIC Function Panel  Recent Labs  04/06/16 1456 04/12/16 0227  PROT 7.7 5.7*   HEMOGLOBIN A1C No components found for: HGA1C,  MPG CARDIAC ENZYMES Lab Results  Component Value Date   TROPONINI <0.03 04/07/2016   TROPONINI <0.03 04/06/2016   TROPONINI <0.03 04/06/2016   BNP No results for input(s): PROBNP in the last 8760 hours. TSH No results for input(s): TSH in the last 8760 hours. CHOLESTEROL No results for input(s): CHOL in the last 8760 hours.  Scheduled  Meds: . amLODipine  10 mg Oral Daily  . aspirin EC  81 mg Oral Daily  . atorvastatin  20 mg Oral Once per day on Mon Wed Fri  . carvedilol  12.5 mg Oral BID WC  . Chlorhexidine Gluconate Cloth  6 each Topical Daily  . collagenase   Topical Daily  . feeding supplement (ENSURE ENLIVE)  237 mL Oral BID BM  . ferrous sulfate  325 mg Oral TID WC  . mouth rinse  15 mL Mouth Rinse BID  . mupirocin ointment  1 application Nasal BID  . pantoprazole  40 mg Oral Q0600  . potassium chloride  10 mEq Oral TID  . spironolactone  12.5 mg Oral Daily   Continuous Infusions: . 0.9 % NaCl with KCl 20 mEq / L 30 mL/hr at 04/11/16 1123   PRN Meds:.acetaminophen, hydrALAZINE, HYDROcodone-acetaminophen, levalbuterol, magnesium sulfate 1 -  4 g bolus IVPB, ondansetron (ZOFRAN) IV  Assessment/Plan: Left leg severe PVD with claudication and heel ulcer Right leg significant PVD S/P bilateral iliac stents COPD with pulmonary fibrosis Anemia of acute blood loss Hypertension Moderate protein calorie malnutrition CAD  Await vascular surgery today. Appreciate GI consult.   LOS: 6 days    [name]  MD  04/12/2016, 9:00 AM

## 2016-04-12 NOTE — Op Note (Addendum)
OPERATIVE NOTE   PROCEDURE: 1. Left common femoral/profunda femoral artery to above-the-knee popliteal artery bypass with ipsilateral non-reversed greater saphenous vein   PRE-OPERATIVE DIAGNOSIS: left heel ulcer  POST-OPERATIVE DIAGNOSIS: same  SURGEON: Leonides Sake, MD  ASSISTANT(S): Lianne Cure, [facility]   ANESTHESIA: general  ESTIMATED BLOOD LOSS: 125 cc  FINDING(S): 1.  Heavily calcified left external iliac artery that was friable 2.  Soft non-diseased profunda femoral artery  3.  Sub-total occluded distal superficial femoral artery just proximal to distal anastomosis 4.  Palpable pulse in above-the-knee popliteal artery 5.  Dopplerable tibial signals: AT > peroneal > PT  SPECIMEN(S):  none  INDICATIONS:   Caitlin Vasquez is a 71 y.o. female who presents with left heel ulcer.  Her angiogram demonstrated extensive common iliac artery disease requiring B CIA stenting and B SFA occlusions.  Given persistence of pain in L feet with non-healing ulcer, I recommended: left femoropopliteal bypass. The risk, benefits, and alternative for bypass operations were discussed with the patient.  The patient is aware the risks include but are not limited to: bleeding, infection, myocardial infarction, stroke, limb loss, nerve damage, need for additional procedures in the future, wound complications, and inability to complete the bypass.  The patient is aware of these risks and agreed to proceed.  DESCRIPTION: After full informed written consent was obtained, the patient was brought back to the operating room and placed supine upon the operating table.  Prior to induction, the patient was given intravenous antibiotics.  After obtaining adequate anesthesia, the patient was prepped and draped in the standard fashion for a femoral to popliteal bypass operation.  Attention was turned to the left groin.  A longitudinal incision was made over the left common femoral artery.  Using blunt dissection and  electrocautery, the artery was dissected out from the distal external iliac artery down to the femoral bifurcation.  The superficial femoral artery, profunda femoral artery, and external iliac artery were dissected out and vessel loops applied.  Circumflex branches were also dissected and controlled with silk ties.  This common femoral artery was found on exam to be calcified in an eccentric fashion.  The distal external iliac artery was calcified and friable: bled with simple blunt dissection.  At this point, attention was turned to the distal thigh.  An longitudinal incision was made over Hunter's canal.  Using blunt dissection and electrocautery, a plane was developed through the subcutaneous tissue and fascia down to Hunter's canal.  The above-the-knee popliteal artery was dissected away from its adjacent veins.  This popliteal artery was found on exam to be soft and not calcified.  I felt this would be an adequate target.  At this point, the patient's left greater saphenous vein was identified under Sonosite guidance.  Skip incisions was made over the greater saphenous vein from the saphenofemoral junction down to knee.  The vein conduit was found to be adequate with size: 3-3.5 mm.  Side branches of greater saphenous vein were tied off with 4-0 silk or clipped with small titanium clips.  The saphenofemoral junction was clamped and the greater saphenous vein was transected.  The saphenofemoral junction was oversewn in a double layer with a running stitch of 5-0 prolene.  The distal extent of this conduit was tied off at the level of knee and the conduit transected proximally.  The harvest vein conduit was soaked in a heparinized saline solution.    At this point, I tunneled from the above-the-knee popliteal exposure to  the right groin with a Bard tunneler.  The tunneler was left in place.  The vein conduit was then interrogated and leaks repaired with 4-0 silk ties and 6-0 prolene stitches.  I felt the  conduit was adequate though sub-optimal.  At this point, I turned my attention to the left groin.  I put the profunda femoral artery under tension and then tried to clamped the distal external iliac artery.  In this process, I found that I was damaging the distal external iliac artery with clamping.  I had to repair a few bleeding points with 6-0 Prolene stitches.  I tried to clamp the proximal common femoral artery but the eccentric calcific plaque prevented control of the blood flow.  I was able however to clamp the distal common femoral artery.  As the profunda femoral artery felt disease free and the superficial femoral artery was chronically occluded,   I felt the profunda femoral artery was essentially an extension of the common femoral artery and could be used an anastomotic point.    I made an arteriotomy in the profunda femoral artery with a 11-blade and extended into the distal common femoral artery and slightly into the profunda femoral artery.    The proximal extent of the vein conduit was spatulated to the geometry of the arteriotomy.  The conduit was sewn to the common femoral/profunda artery in an end-to-side configuration with a running stitch of 6-0 Prolene.  Prior to completing this anastomosis, I allowed the external iliac artery, profunda femoral artery, and superficial femoral artery to backbleed.  There was no clot.  I completed the anastomosis in the usual fashion.     I released the clamp and the vein conduit distended to the first valve.  I lysed the valves in this vein bypass twice with valvutomes under direct visualization.  There was a pulse in this vein conduit.  I clamped the distal conduit and marked the anterior wall of this bypass graft.  The conduit was irrigated with heparinized saline and suctioned out.    The above-the-knee popliteal artery was then clamped proximally and distally.  An arteriotomy was made with a 11-blade and were extended proximally and distally with a  Pott's scissor.  Immediately, a subtotal occlusion of the distal superficial femoral artery could be seen.  I could easily pass a 4 mm dilator distally in the above-the-knee popliteal artery.  The distal end of the conduit was spatulated, shortening the conduit in the process.  The conduit was sewn to the to the artery in an end-to-side configuration with a running stitch of 6-0 Prolene.  Prior to completing the anastomosis, I backbled both end of the artery.  There was: no clots evident.  The conduit was allowed to bleed in an antegrade fashion.  There was: no clots evident.    At this point, all clamps were removed.  Immediately, there was a pulse in the above-the-knee popliteal artery.  Distally all tibial signals could be dopplered: AT > peroneal > PT.  At this point, I gave the patient 30 mg of Protamine to reverse anticoagulation.  All wound were washed out.  Bleeding points were controlled with electrocautery, and suture repair of active bleeding points in the suture line.  No further bleeding was present.    The above-the-knee exposure was repaired with a double layer of 2-0 Vicryl, a single layer of 3-0 Vicryl, and a running subcuticular of 4-0 Monocryl in the skin layer.  In a similar fashion, the groin was  repaired with a double layer of 2-0 Vicryl, a double layer of 3-0 Vicryl, and a running subcuticular of 4-0 Monocryl in the skin layer.  Each of the 3 vein harvest incision was repaired with a layer of 3-0 Vicryl and a running subcuticular layer of 4-0 Monocryl.  All skin incisions were cleaned, dried, and reinforced with Dermabond.  A sterile dressing was applied to each incision.   COMPLICATIONS: none  CONDITION: stable   Leonides SakeBrian Chen, MD, [facility] [place] Office: [phone] Pager: [phone]  04/12/2016, 4:35 PM

## 2016-04-12 NOTE — Anesthesia Procedure Notes (Signed)
Procedure Name: Intubation Date/Time: 04/12/2016 12:17 PM Performed by: Geraldo DockerSOLHEIM, Hobie Kohles SALOMON Pre-anesthesia Checklist: Patient identified, Patient being monitored, Timeout performed, Emergency Drugs available and Suction available Patient Re-evaluated:Patient Re-evaluated prior to inductionOxygen Delivery Method: Circle System Utilized Preoxygenation: Pre-oxygenation with 100% oxygen Intubation Type: IV induction Ventilation: Mask ventilation without difficulty Laryngoscope Size: Miller and 3 Grade View: Grade I Tube type: Oral Tube size: 7.5 mm Number of attempts: 1 Airway Equipment and Method: Stylet Placement Confirmation: ETT inserted through vocal cords under direct vision,  positive ETCO2 and breath sounds checked- equal and bilateral Secured at: 21 cm Tube secured with: Tape Dental Injury: Teeth and Oropharynx as per pre-operative assessment

## 2016-04-12 NOTE — Transfer of Care (Signed)
Immediate Anesthesia Transfer of Care Note  Patient: Caitlin Vasquez  Procedure(s) Performed: Procedure(s): LEFT FEMORAL-POPITEAL  BYPASS GRAFT (Left) LEFT GREATER SAPHENOUS VEIN HARVEST (Left) DRESSING CHANGE LEFT HIP AND LEFT HEEL (Left)  Patient Location: PACU  Anesthesia Type:General  Level of Consciousness: awake, oriented, sedated, patient cooperative and responds to stimulation  Airway & Oxygen Therapy: Patient Spontanous Breathing and Patient connected to nasal cannula oxygen  Post-op Assessment: Report given to RN, Post -op Vital signs reviewed and stable, Patient moving all extremities and Patient moving all extremities X 4  Post vital signs: Reviewed and stable  Last Vitals:  Vitals:   04/12/16 0436 04/12/16 0859  BP: 130/66 127/64  Pulse: 79 79  Resp: 18   Temp: 36.5 C     Last Pain:  Vitals:   04/12/16 0949  TempSrc:   PainSc: 3       Patients Stated Pain Goal: 0 (04/12/16 0453)  Complications: No apparent anesthesia complications

## 2016-04-13 ENCOUNTER — Encounter (HOSPITAL_COMMUNITY): Payer: Self-pay | Admitting: Vascular Surgery

## 2016-04-13 LAB — CBC
HCT: 26.2 % — ABNORMAL LOW (ref 36.0–46.0)
Hemoglobin: 8.7 g/dL — ABNORMAL LOW (ref 12.0–15.0)
MCH: 27.4 pg (ref 26.0–34.0)
MCHC: 33.2 g/dL (ref 30.0–36.0)
MCV: 82.4 fL (ref 78.0–100.0)
Platelets: 231 10*3/uL (ref 150–400)
RBC: 3.18 MIL/uL — ABNORMAL LOW (ref 3.87–5.11)
RDW: 14.6 % (ref 11.5–15.5)
WBC: 11.9 10*3/uL — ABNORMAL HIGH (ref 4.0–10.5)

## 2016-04-13 LAB — BASIC METABOLIC PANEL
Anion gap: 10 (ref 5–15)
BUN: 11 mg/dL (ref 6–20)
CO2: 22 mmol/L (ref 22–32)
Calcium: 8.2 mg/dL — ABNORMAL LOW (ref 8.9–10.3)
Chloride: 102 mmol/L (ref 101–111)
Creatinine, Ser: 0.6 mg/dL (ref 0.44–1.00)
GFR calc Af Amer: >60 mL/min (ref >60)
GFR calc non Af Amer: >60 mL/min (ref >60)
Glucose, Bld: 135 mg/dL — ABNORMAL HIGH (ref 65–99)
Potassium: 4.4 mmol/L (ref 3.5–5.1)
Sodium: 134 mmol/L — ABNORMAL LOW (ref 135–145)

## 2016-04-13 NOTE — Evaluation (Signed)
Occupational Therapy Evaluation Patient Details Name: Caitlin Vasquez MRN: [id number] DOB: [birth year] Today's Date: 04/13/2016    History of Present Illness s/p L fem-pop bypass after admission withL LE pain and non healing wound. PMH: COPD on 2L 02 at home, CHF, HTN, CAD, DM, L hip fx in Dec. 2017.   Clinical Impression   Pt was living alone and performing ADL and meal prep modified independently prior to admission. Pt presents with post operative pain, generalized weakness and decreased standing balance interfering with ability to perform ADL and mobility at her baseline. Will follow acutely. Pt needs to be functioning at a modified independent level to return home with intermittent assist of her son.    Follow Up Recommendations  No OT follow up    Equipment Recommendations  None recommended by OT    Recommendations for Other Services       Precautions / Restrictions Precautions Precautions: Fall Restrictions Weight Bearing Restrictions: No      Mobility Bed Mobility               General bed mobility comments: pt at EOB with RN  Transfers Overall transfer level: Needs assistance   Transfers: Sit to/from Stand;Stand Pivot Transfers Sit to Stand: Min assist Stand pivot transfers: Min assist       General transfer comment: assist to rise and steady    Balance Overall balance assessment: Needs assistance Sitting-balance support: Feet supported Sitting balance-Leahy Scale: Good       Standing balance-Leahy Scale: Poor                              ADL Overall ADL's : Needs assistance/impaired Eating/Feeding: Independent;Sitting   Grooming: Wash/dry hands;Sitting;Set up   Upper Body Bathing: Set up;Sitting   Lower Body Bathing: Minimal assistance;Sit to/from stand   Upper Body Dressing : Set up;Sitting   Lower Body Dressing: Minimal assistance;Sit to/from stand   Toilet Transfer: Minimal Cabin crewassistance;Stand-pivot Toilet Transfer Details  (indicate cue type and reason): simulated to recliner Toileting- Clothing Manipulation and Hygiene: Minimal assistance;Sit to/from stand         General ADL Comments: Pt had a sock aide, but left is at the nursing home, provided pt with a replacement.     Vision Patient Visual Report: No change from baseline       Perception     Praxis      Pertinent Vitals/Pain Pain Assessment: 0-10 Pain Score: 6  Pain Location: L groin Pain Descriptors / Indicators: Operative site guarding;Grimacing Pain Intervention(s): Monitored during session;Repositioned     Hand Dominance Right   Extremity/Trunk Assessment Upper Extremity Assessment Upper Extremity Assessment: Overall WFL for tasks assessed   Lower Extremity Assessment Lower Extremity Assessment: Defer to PT evaluation       Communication Communication Communication: No difficulties   Cognition Arousal/Alertness: Awake/alert Behavior During Therapy: WFL for tasks assessed/performed Overall Cognitive Status: Within Functional Limits for tasks assessed                     General Comments       Exercises       Shoulder Instructions      Home Living Family/patient expects to be discharged to:: Private residence Living Arrangements: Alone Available Help at Discharge: Family;Available PRN/intermittently (son lives nearby) Type of Home: House Home Access: Stairs to enter Entergy CorporationEntrance Stairs-Number of Steps: 5 Entrance Stairs-Rails: Right;Left Home Layout: Two level;Able to live on  main level with bedroom/bathroom;1/2 bath on main level (pt sleeps on the couch) Alternate Level Stairs-Number of Steps: flight   Bathroom Shower/Tub:  (pt does not go upstairs, sponge bathes)   Bathroom Toilet: Standard     Home Equipment: Walker - 2 wheels;Adaptive equipment Adaptive Equipment: Reacher        Prior Functioning/Environment Level of Independence: Needs assistance  Gait / Transfers Assistance Needed: walker  intermittently, mostly furniture walked ADL's / Homemaking Assistance Needed: sponge bathed, had friend clean, pt prepared simple meals, son gets groceries            OT Problem List: Decreased strength;Impaired balance (sitting and/or standing);Pain      OT Treatment/Interventions: Self-care/ADL training;DME and/or AE instruction;Therapeutic activities;Patient/family education;Balance training    OT Goals(Current goals can be found in the care plan section) Acute Rehab OT Goals Patient Stated Goal: to go home OT Goal Formulation: With patient Time For Goal Achievement: 04/20/16 Potential to Achieve Goals: Good ADL Goals Pt Will Perform Grooming: with modified independence;standing Pt Will Perform Lower Body Bathing: with modified independence;sit to/from stand;with adaptive equipment Pt Will Perform Lower Body Dressing: with modified independence;with adaptive equipment;sit to/from stand Pt Will Transfer to Toilet: with modified independence;ambulating;regular height toilet Additional ADL Goal #1: Pt will gather items necessary for ADL around room with RW modified independently.  OT Frequency: Min 2X/week   Barriers to D/C:            Co-evaluation              End of Session Equipment Utilized During Treatment: Gait belt Nurse Communication: Mobility status  Activity Tolerance: Patient tolerated treatment well Patient left: in chair;with call bell/phone within reach;with chair alarm set  OT Visit Diagnosis: Unsteadiness on feet (R26.81);Pain;History of falling (Z91.81) Pain - Right/Left: Left Pain - part of body: Leg                ADL either performed or assessed with clinical judgement  Time: 4098-1191 OT Time Calculation (min): 32 min Charges:  OT General Charges $OT Visit: 1 Procedure OT Evaluation $OT Eval Moderate Complexity: 1 Procedure OT Treatments $Self Care/Home Management : 8-22 mins G-Codes:     Evern Bio 04/13/2016, 9:23 AM   [phone]

## 2016-04-13 NOTE — Care Management Note (Signed)
Case Management Note  Patient Details  Name: Caitlin Vasquez MRN: [id number] Date of Birth: [birth year]  Subjective/Objective:   POD 1 fem pop, from home alone, per pt eval rec HHPT.  Will need to be set up and will need HHPT orders with face to face. NCM will cont to follow for dc needs.                   Action/Plan:   Expected Discharge Date:                  Expected Discharge Plan:  Home w Home Health Services  In-House Referral:     Discharge planning Services  CM Consult  Post Acute Care Choice:    Choice offered to:     DME Arranged:    DME Agency:     HH Arranged:    HH Agency:     Status of Service:  In process, will continue to follow  If discussed at Long Length of Stay Meetings, dates discussed:    Additional Comments:  Leone Havenaylor, Keslie Gritz Clinton, RN 04/13/2016, 9:35 PM

## 2016-04-13 NOTE — Progress Notes (Signed)
Ref: Kabao Leite S, MD   Subjective:  Feeling same but left leg is warmer to touch. Afebrile.  Objective:  Vital Signs in the last 24 hours: Temp:  [97.4 F (36.3 C)-98.2 F (36.8 C)] 98.1 F (36.7 C) (03/07 0826) Pulse Rate:  [76-96] 76 (03/07 0826) Cardiac Rhythm: Normal sinus rhythm (03/07 0700) Resp:  [14-24] 16 (03/07 0826) BP: (103-135)/(49-74) 113/59 (03/07 0839) SpO2:  [97 %-100 %] 97 % (03/07 0826) Weight:  [46.8 kg (103 lb 2 oz)] 46.8 kg (103 lb 2 oz) (03/07 0500)  Physical Exam: BP Readings from Last 1 Encounters:  04/13/16 (!) 113/59    Wt Readings from Last 1 Encounters:  04/13/16 46.8 kg (103 lb 2 oz)    Weight change: -0.125 kg (-4.4 oz) Body mass index is 17.7 kg/m. HEENT: [place]/AT, Eyes-Hazel, PERL, EOMI, Conjunctiva-Pink, Sclera-Non-icteric Neck: No JVD, No bruit, Trachea midline. Lungs:  Clear, Bilateral. Cardiac:  Regular rhythm, normal S1 and S2, no S3. II/VI systolic murmur. Abdomen:  Soft, non-tender. BS present. Extremities:  No edema present. No cyanosis. No clubbing. CNS: AxOx3, Cranial nerves grossly intact, moves all 4 extremities.  Skin: Warm and dry.   Intake/Output from previous day: 03/06 0701 - 03/07 0700 In: 3620 [P.O.:120; I.V.:3450; IV Piggyback:50] Out: 2900 [Urine:2650; Blood:250]    Lab Results: BMET    Component Value Date/Time   NA 134 (L) 04/12/2016 2359   NA 136 04/12/2016 0227   NA 136 04/11/2016 0136   K 4.4 04/12/2016 2359   K 3.9 04/12/2016 0227   K 3.3 (L) 04/11/2016 0136   CL 102 04/12/2016 2359   CL 105 04/12/2016 0227   CL 106 04/11/2016 0136   CO2 22 04/12/2016 2359   CO2 24 04/12/2016 0227   CO2 25 04/11/2016 0136   GLUCOSE 135 (H) 04/12/2016 2359   GLUCOSE 98 04/12/2016 0227   GLUCOSE 108 (H) 04/11/2016 0136   BUN 11 04/12/2016 2359   BUN 10 04/12/2016 0227   BUN 19 04/11/2016 0136   CREATININE 0.60 04/12/2016 2359   CREATININE 0.63 04/12/2016 0227   CREATININE 0.64 04/11/2016 0136   CALCIUM 8.2  (L) 04/12/2016 2359   CALCIUM 8.5 (L) 04/12/2016 0227   CALCIUM 8.2 (L) 04/11/2016 0136   GFRNONAA >60 04/12/2016 2359   GFRNONAA >60 04/12/2016 0227   GFRNONAA >60 04/11/2016 0136   GFRAA >60 04/12/2016 2359   GFRAA >60 04/12/2016 0227   GFRAA >60 04/11/2016 0136   CBC    Component Value Date/Time   WBC 11.9 (H) 04/12/2016 2359   RBC 3.18 (L) 04/12/2016 2359   HGB 8.7 (L) 04/12/2016 2359   HCT 26.2 (L) 04/12/2016 2359   PLT 231 04/12/2016 2359   MCV 82.4 04/12/2016 2359   MCH 27.4 04/12/2016 2359   MCHC 33.2 04/12/2016 2359   RDW 14.6 04/12/2016 2359   LYMPHSABS 1.5 04/06/2016 1456   MONOABS 0.5 04/06/2016 1456   EOSABS 0.6 04/06/2016 1456   BASOSABS 0.0 04/06/2016 1456   HEPATIC Function Panel  Recent Labs  04/06/16 1456 04/12/16 0227  PROT 7.7 5.7*   HEMOGLOBIN A1C No components found for: HGA1C,  MPG CARDIAC ENZYMES Lab Results  Component Value Date   TROPONINI <0.03 04/07/2016   TROPONINI <0.03 04/06/2016   TROPONINI <0.03 04/06/2016   BNP No results for input(s): PROBNP in the last 8760 hours. TSH No results for input(s): TSH in the last 8760 hours. CHOLESTEROL No results for input(s): CHOL in the last 8760 hours.  Scheduled Meds: .  amLODipine  10 mg Oral Daily  . aspirin EC  81 mg Oral Daily  . atorvastatin  20 mg Oral Once per day on Mon Wed Fri  . carvedilol  12.5 mg Oral BID WC  . cefUROXime (ZINACEF)  IV  1.5 g Intravenous Q12H  . Chlorhexidine Gluconate Cloth  6 each Topical Daily  . clopidogrel  75 mg Oral Daily  . collagenase   Topical Daily  . docusate sodium  100 mg Oral Daily  . enoxaparin (LOVENOX) injection  30 mg Subcutaneous Q24H  . feeding supplement (ENSURE ENLIVE)  237 mL Oral BID BM  . ferrous sulfate  325 mg Oral TID WC  . mouth rinse  15 mL Mouth Rinse BID  . mupirocin ointment  1 application Nasal BID  . pantoprazole  40 mg Oral Q0600  . potassium chloride  10 mEq Oral TID  . spironolactone  12.5 mg Oral Daily    Continuous Infusions: . sodium chloride 75 mL/hr at 04/13/16 0600  . 0.9 % NaCl with KCl 20 mEq / L 30 mL/hr at 04/12/16 2156  . lactated ringers Stopped (04/12/16 1615)   PRN Meds:.sodium chloride, acetaminophen, alum & mag hydroxide-simeth, bisacodyl, guaiFENesin-dextromethorphan, hydrALAZINE, HYDROcodone-acetaminophen, labetalol, levalbuterol, magnesium sulfate 1 - 4 g bolus IVPB, metoprolol, morphine injection, ondansetron (ZOFRAN) IV, oxyCODONE-acetaminophen, phenol, potassium chloride, senna-docusate  Assessment/Plan: Left common femoral/Profunda artery to below the knee popliteal artery bypass Bilateral common iliac stenting COPD with pulmonary fibrosis Anemia of acute blood loss Hypertension Moderate protein calorie malnutrition CAD  Continue aspirin and Plavix. Increase activity with PT consult ordered by vascular surgery.   LOS: 7 days    [name]  MD  04/13/2016, 9:28 AM

## 2016-04-13 NOTE — Progress Notes (Addendum)
[facility]  Subjective  - Very pleased with the surgical out comes so far.  Increased sensation in the left foot.   Objective 133/67 76 97.4 F (36.3 C) (Oral) 17 98%  Intake/Output Summary (Last 24 hours) at 04/13/16 0755 Last data filed at 04/13/16 0600  Gross per 24 hour  Intake             3620 ml  Output             2900 ml  Net              720 ml    Groin soft without hematoma, left incisions healing well Palpable DP left foot Left heel santyl with dry guaze Heart RRR Lungs non labored breathing  Assessment/Planning: POD # 1 Left common femoral/profunda femoral artery to below-the-knee popliteal artery bypass with ipsilateral non-reversed greater saphenous vein  POD#5 POD # 3  #1 left common iliac occlusionstented with 9 x 59 I cast to 0 residual stenosis  #2 70% stenosis right common iliac stenting with 9 x 59 I cast 20 residual stenosis Plan left fem-pop by pass tomorrow 04/12/2016 NPO past MN  PT ordered OK to transfer to 2W Plavix starting today s/p stents  Thomasena EdisCOLLINS, [facility] And Medical CenterEMMA [facility] IncMAUREEN 04/13/2016 7:55 AM --  Laboratory Lab Results:  Recent Labs  04/12/16 0227 04/12/16 2359  WBC 8.6 11.9*  HGB 9.6* 8.7*  HCT 29.1* 26.2*  PLT 209 231   BMET  Recent Labs  04/12/16 0227 04/12/16 2359  NA 136 134*  K 3.9 4.4  CL 105 102  CO2 24 22  GLUCOSE 98 135*  BUN 10 11  CREATININE 0.63 0.60  CALCIUM 8.5* 8.2*    COAG Lab Results  Component Value Date   INR 1.11 04/12/2016   INR 1.03 11/28/2014   No results found for: PTT  Addendum  I have independently interviewed and examined the patient, and I agree with the physician assistant's findings.  Inc c/d/i, palpable DP at this point.  L Foot drop still present.  - PT/OT: suspect will need rehab as chronically debilitated with prior history consistent with failure to thrive - Likely will need brace for L foot drop - Santyl to L heel with wet-to-dry  dressings  Leonides SakeBrian Arshia Spellman, MD, FACS [facility] [place] Office: [phone] Pager: [phone]  04/13/2016, 10:01 AM

## 2016-04-13 NOTE — Progress Notes (Addendum)
Nutrition Follow Up  DOCUMENTATION CODES:   Underweight, Non-severe (moderate) malnutrition in context of chronic illness  INTERVENTION:    Continue Ensure Enlive po BID, each supplement provides 350 kcal and 20 grams of protein   Coninue Magic cup TID with meals, each supplement provides 290 kcal and 9 grams of protein  NUTRITION DIAGNOSIS:   Malnutrition related to chronic illness as evidenced by moderate depletion of body fat, severe depletion of muscle mass, mild fluid accumulation, ongoing  GOAL:   Patient will meet greater than or equal to 90% of their needs, progressing   MONITOR:   PO intake, Supplement acceptance, I & O's, Weight trends   ASSESSMENT:   Pt with PMH of COPD, HTN, on 2 L home O2, dyslipidemia, CHF and CAD has SOB at rest with leg edema and discomfort in left calf area. Positive chronic cough.   Pt s/p procedures: 3/2  Abdominal aortogram with bilateral lower extremity runoff  3/6  Left common femoral/profunda femoral artery to below-the-knee popliteal artery bypass   PO intake variable at 40-100% per flowsheets. Drinking some of her Ensure Enlive nutrition supplements. Also receiving Magic Cup frozen dessert supplement on meal trays. Medications reviewed and include ferrous sulfate. Labs reviewed.  Sodium 134 (L).  Diet Order:  Diet Heart Room service appropriate? Yes; Fluid consistency: Thin  Skin:  Wound (see comment) (non-pressure to heel, Stg II to buttocks)  Last BM:  3/6  Height:   Ht Readings from Last 1 Encounters:  04/06/16 5\' 4"  (1.626 m)    Weight:   Wt Readings from Last 1 Encounters:  04/13/16 103 lb 2 oz (46.8 kg)    Ideal Body Weight:  54.5 kg  BMI:  Body mass index is 17.7 kg/m.  Estimated Nutritional Needs:   Kcal:  1450-1650  Protein:  75-85 grams   Fluid:  < 1.2 L/d  EDUCATION NEEDS:   No education needs identified at this time  Maureen ChattersKatie Noel Henandez, RD, LDN Pager #: [phone] After-Hours Pager #:  [phone]

## 2016-04-13 NOTE — Evaluation (Signed)
Physical Therapy Evaluation Patient Details Name: Caitlin Vasquez MRN: [id number] DOB: October 02, 1945 Today's Date: 04/13/2016   History of Present Illness  Pt is a 71 y/o female s/p L fem-pop bypass after admission withL LE pain and non healing wound. PMH: COPD on 2L 02 at home, CHF, HTN, CAD, DM, L hip fx in Dec. 2017.  Clinical Impression  Pt presented supine in bed with HOB elevated, awake and willing to participate in therapy session. Prior to admission, pt reported that she used either a SPC or RW to ambulate and was independent with ADLs. Pt currently requires min guard for bed mobility, min guard for transfers and min guard for safety with ambulation using a RW. All VSS throughout. Pt would continue to benefit from skilled physical therapy services at this time while admitted and after d/c to address her below listed limitations in order to improve her overall safety and independence with functional mobility.      Follow Up Recommendations Home health PT;Supervision/Assistance - 24 hour    Equipment Recommendations  None recommended by PT    Recommendations for Other Services       Precautions / Restrictions Precautions Precautions: Fall Restrictions Weight Bearing Restrictions: No      Mobility  Bed Mobility Overal bed mobility: Needs Assistance Bed Mobility: Supine to Sit;Sit to Supine     Supine to sit: HOB elevated;Supervision Sit to supine: Supervision   General bed mobility comments: increased time, use of bed rails, supervision for safety  Transfers Overall transfer level: Needs assistance Equipment used: Rolling walker (2 wheeled) Transfers: Sit to/from Stand Sit to Stand: Min guard         General transfer comment: increased time, VC'ing for bilateral hand placement and min guard for safety  Ambulation/Gait Ambulation/Gait assistance: Min guard Ambulation Distance (Feet): 20 Feet Assistive device: Rolling walker (2 wheeled) Gait Pattern/deviations:  Step-through pattern;Decreased step length - left;Decreased step length - right;Decreased stride length;Antalgic Gait velocity: decreased Gait velocity interpretation: Below normal speed for age/gender General Gait Details: pt demonstrated safety with use of RW, moderately antalgic gait pattern, min guard for safety  Stairs            Wheelchair Mobility    Modified Rankin (Stroke Patients Only)       Balance Overall balance assessment: Needs assistance Sitting-balance support: Feet supported Sitting balance-Leahy Scale: Good     Standing balance support: During functional activity;Bilateral upper extremity supported Standing balance-Leahy Scale: Poor Standing balance comment: pt reliant on bilateral UEs on RW                             Pertinent Vitals/Pain Pain Assessment: 0-10 Pain Score: 6  Pain Location: L heel and incision sites Pain Descriptors / Indicators: Operative site guarding;Grimacing Pain Intervention(s): Monitored during session;Repositioned    Home Living Family/patient expects to be discharged to:: Private residence Living Arrangements: Alone Available Help at Discharge: Family;Available PRN/intermittently (son lives nearby) Type of Home: House Home Access: Stairs to enter Entrance Stairs-Rails: Doctor, general practice of Steps: 5 Home Layout: Two level;Able to live on main level with bedroom/bathroom;1/2 bath on main level (pt sleeps on the couch) Home Equipment: Walker - 2 wheels;Adaptive equipment      Prior Function Level of Independence: Needs assistance   Gait / Transfers Assistance Needed: Pt reported that she uses either a SPC or RW to ambulate  ADL's / Homemaking Assistance Needed: pt reported that she sponge  bathes herself and is independent with dressing        Hand Dominance   Dominant Hand: Right    Extremity/Trunk Assessment   Upper Extremity Assessment Upper Extremity Assessment: Defer to OT  evaluation    Lower Extremity Assessment Lower Extremity Assessment: LLE deficits/detail LLE Deficits / Details: pt with limited ROM and strength deficits secondary to post-op. Sensation to light touch decreased on foot, intact throughout the rest of the LE    Cervical / Trunk Assessment Cervical / Trunk Assessment: Normal  Communication   Communication: No difficulties  Cognition Arousal/Alertness: Awake/alert Behavior During Therapy: WFL for tasks assessed/performed Overall Cognitive Status: Within Functional Limits for tasks assessed                      General Comments      Exercises     Assessment/Plan    PT Assessment Patient needs continued PT services  PT Problem List Decreased strength;Decreased activity tolerance;Decreased balance;Decreased mobility;Decreased coordination;Decreased knowledge of use of DME;Decreased safety awareness;Cardiopulmonary status limiting activity;Pain       PT Treatment Interventions DME instruction;Gait training;Stair training;Functional mobility training;Therapeutic activities;Therapeutic exercise;Balance training;Neuromuscular re-education;Patient/family education    PT Goals (Current goals can be found in the Care Plan section)  Acute Rehab PT Goals Patient Stated Goal: return home PT Goal Formulation: With patient Time For Goal Achievement: 04/27/16 Potential to Achieve Goals: Good    Frequency Min 3X/week   Barriers to discharge        Co-evaluation               End of Session Equipment Utilized During Treatment: Gait belt;Oxygen Activity Tolerance: Patient limited by pain;Patient limited by fatigue Patient left: in bed;with call bell/phone within reach Nurse Communication: Mobility status PT Visit Diagnosis: Other abnormalities of gait and mobility (R26.89);Pain Pain - Right/Left: Left Pain - part of body: Leg         Time: 1325-1346 PT Time Calculation (min) (ACUTE ONLY): 21 min   Charges:   PT  Evaluation $PT Eval Moderate Complexity: 1 Procedure     PT G CodesAlessandra Bevels:         Tiler Brandis M Kellis Mcadam 04/13/2016, 2:01 PM Deborah ChalkJennifer Malikai Gut, PT, DPT [phone]

## 2016-04-13 NOTE — Anesthesia Postprocedure Evaluation (Addendum)
Anesthesia Post Note  Patient: Caitlin Vasquez  Procedure(s) Performed: Procedure(s) (LRB): LEFT FEMORAL-POPITEAL  BYPASS GRAFT (Left) LEFT GREATER SAPHENOUS VEIN HARVEST (Left) DRESSING CHANGE LEFT HIP AND LEFT HEEL (Left)  Patient location during evaluation: PACU Anesthesia Type: General Level of consciousness: awake and alert and patient cooperative Pain management: pain level controlled Vital Signs Assessment: post-procedure vital signs reviewed and stable Respiratory status: spontaneous breathing and respiratory function stable Cardiovascular status: stable Anesthetic complications: no       Last Vitals:  Vitals:   04/13/16 1545 04/13/16 1600  BP: (!) 119/56   Pulse: 87 69  Resp: (!) 23 (!) 21  Temp:      Last Pain:  Vitals:   04/13/16 1642  TempSrc:   PainSc: 4                  Janmichael Giraud S

## 2016-04-14 ENCOUNTER — Inpatient Hospital Stay (HOSPITAL_COMMUNITY): Payer: Medicare Other

## 2016-04-14 DIAGNOSIS — Z9889 Other specified postprocedural states: Secondary | ICD-10-CM

## 2016-04-14 LAB — BPAM RBC
Blood Product Expiration Date: 201803102359
Blood Product Expiration Date: 201803192359
Blood Product Expiration Date: 201803192359
ISSUE DATE / TIME: 201802261008
ISSUE DATE / TIME: 201803041815
ISSUE DATE / TIME: 201803051044
Unit Type and Rh: 9500
Unit Type and Rh: 9500
Unit Type and Rh: 9500

## 2016-04-14 LAB — TYPE AND SCREEN
ABO/RH(D): O NEG
Antibody Screen: NEGATIVE
Unit division: 0
Unit division: 0
Unit division: 0

## 2016-04-14 LAB — CBC WITH DIFFERENTIAL/PLATELET
Basophils Absolute: 0 10*3/uL (ref 0.0–0.1)
Basophils Relative: 0 %
Eosinophils Absolute: 0.1 10*3/uL (ref 0.0–0.7)
Eosinophils Relative: 1 %
HCT: 26.9 % — ABNORMAL LOW (ref 36.0–46.0)
Hemoglobin: 8.7 g/dL — ABNORMAL LOW (ref 12.0–15.0)
Lymphocytes Relative: 10 %
Lymphs Abs: 1 10*3/uL (ref 0.7–4.0)
MCH: 28 pg (ref 26.0–34.0)
MCHC: 32.3 g/dL (ref 30.0–36.0)
MCV: 86.5 fL (ref 78.0–100.0)
Monocytes Absolute: 0.7 10*3/uL (ref 0.1–1.0)
Monocytes Relative: 7 %
Neutro Abs: 7.9 10*3/uL — ABNORMAL HIGH (ref 1.7–7.7)
Neutrophils Relative %: 82 %
Platelets: 285 10*3/uL (ref 150–400)
RBC: 3.11 MIL/uL — ABNORMAL LOW (ref 3.87–5.11)
RDW: 16.5 % — ABNORMAL HIGH (ref 11.5–15.5)
WBC: 9.8 10*3/uL (ref 4.0–10.5)

## 2016-04-14 LAB — BASIC METABOLIC PANEL
Anion gap: 9 (ref 5–15)
BUN: 11 mg/dL (ref 6–20)
CO2: 26 mmol/L (ref 22–32)
Calcium: 8.3 mg/dL — ABNORMAL LOW (ref 8.9–10.3)
Chloride: 100 mmol/L — ABNORMAL LOW (ref 101–111)
Creatinine, Ser: 0.68 mg/dL (ref 0.44–1.00)
GFR calc Af Amer: >60 mL/min (ref >60)
GFR calc non Af Amer: >60 mL/min (ref >60)
Glucose, Bld: 127 mg/dL — ABNORMAL HIGH (ref 65–99)
Potassium: 3.7 mmol/L (ref 3.5–5.1)
Sodium: 135 mmol/L (ref 135–145)

## 2016-04-14 NOTE — Progress Notes (Signed)
Occupational Therapy Treatment Patient Details Name: Caitlin Vasquez MRN: [id number] DOB: Jan 25, 1946 Today's Date: 04/14/2016    History of present illness Pt is a 71 y/o female s/p L fem-pop bypass after admission withL LE pain and non healing wound. PMH: COPD on 2L 02 at home, CHF, HTN, CAD, DM, L hip fx in Dec. 2017.   OT comments  Pt able to perform grooming at sink and LB dressing with AE. Use of sock aide is painful on pt's L heel, likely will rely on nurse at home until pain decreases. Pt now with foley and reluctant to move.  Follow Up Recommendations  No OT follow up    Equipment Recommendations  None recommended by OT    Recommendations for Other Services      Precautions / Restrictions Precautions Precautions: Fall       Mobility Bed Mobility Overal bed mobility: Needs Assistance Bed Mobility: Supine to Sit     Supine to sit: Supervision     General bed mobility comments: no physical assist  Transfers Overall transfer level: Needs assistance Equipment used: Rolling walker (2 wheeled) Transfers: Sit to/from Stand Sit to Stand: Min guard              Balance Overall balance assessment: Needs assistance   Sitting balance-Leahy Scale: Good     Standing balance support: During functional activity;Bilateral upper extremity supported Standing balance-Leahy Scale: Fair Standing balance comment: requires at least one hand to balance during ADL or leans on sink                   ADL Overall ADL's : Needs assistance/impaired     Grooming: Wash/dry hands;Min guard;Standing         Lower Body Bathing Details (indicate cue type and reason): recommended long handled bath sponge Upper Body Dressing : Set up;Sitting   Lower Body Dressing: Min guard;Sit to/from stand;With adaptive equipment Lower Body Dressing Details (indicate cue type and reason): use of sock aid is painful Toilet Transfer: Min guard;Ambulation;RW;BSC   Toileting- Designer, fashion/clothingClothing  Manipulation and Hygiene: Min guard;Sit to/from stand         General ADL Comments: Pt likely to rely on her nurse to assist with LB bathing and dressing.      Vision                     Perception     Praxis      Cognition   Behavior During Therapy: WFL for tasks assessed/performed Overall Cognitive Status: Within Functional Limits for tasks assessed                         Exercises     Shoulder Instructions       General Comments      Pertinent Vitals/ Pain       Pain Assessment: 0-10 Pain Score: 6  Pain Location: L heel and incision sites Pain Descriptors / Indicators: Operative site guarding;Grimacing Pain Intervention(s): Repositioned;Monitored during session;RN gave pain meds during session  Home Living                                          Prior Functioning/Environment              Frequency  Min 2X/week        Progress Toward Goals  OT Goals(current goals can now be found in the care plan section)  Progress towards OT goals: Progressing toward goals  Acute Rehab OT Goals Patient Stated Goal: return home Time For Goal Achievement: 04/20/16 Potential to Achieve Goals: Good  Plan Discharge plan remains appropriate    Co-evaluation                 End of Session Equipment Utilized During Treatment: Gait belt;Rolling walker  OT Visit Diagnosis: Unsteadiness on feet (R26.81);Pain;History of falling (Z91.81) Pain - Right/Left: Left Pain - part of body: Leg   Activity Tolerance Patient tolerated treatment well   Patient Left in chair;with call bell/phone within reach;with chair alarm set   Nurse Communication Patient requests pain meds        Time: 6045-4098 OT Time Calculation (min): 21 min  Charges: OT General Charges $OT Visit: 1 Procedure OT Treatments $Self Care/Home Management : 8-22 mins   Evern Bio 04/14/2016, 10:36 AM  [phone]

## 2016-04-14 NOTE — Progress Notes (Signed)
Physical Therapy Treatment Patient Details Name: Caitlin Vasquez J Stennis MRN: [id number] DOB: March 22, 1945 Today's Date: 04/14/2016    History of Present Illness Pt is a 71 y/o female s/p L fem-pop bypass after admission withL LE pain and non healing wound. PMH: COPD on 2L 02 at home, CHF, HTN, CAD, DM, L hip fx in Dec. 2017.    PT Comments    Pt making progress with mobility and tolerated ambulation without supplemental O2 this session with SPO2 maintaining >95% throughout. Pt would continue to benefit from skilled physical therapy services at this time while admitted and after d/c to address her limitations in order to improve her overall safety and independence with functional mobility.     Follow Up Recommendations  Home health PT;Supervision/Assistance - 24 hour     Equipment Recommendations  None recommended by PT    Recommendations for Other Services       Precautions / Restrictions Precautions Precautions: Fall Restrictions Weight Bearing Restrictions: No    Mobility  Bed Mobility Overal bed mobility: Needs Assistance Bed Mobility: Supine to Sit     Supine to sit: Supervision     General bed mobility comments: increased time, no physical assistance needed  Transfers Overall transfer level: Needs assistance Equipment used: 1 person hand held assist Transfers: Sit to/from Stand Sit to Stand: Min guard         General transfer comment: increased time, min guard for safety  Ambulation/Gait Ambulation/Gait assistance: Min assist Ambulation Distance (Feet): 20 Feet Assistive device: 1 person hand held assist Gait Pattern/deviations: Step-through pattern;Decreased step length - left;Decreased step length - right;Decreased stride length;Antalgic Gait velocity: decreased Gait velocity interpretation: Below normal speed for age/gender General Gait Details: min A for stability with one person HHA   Stairs            Wheelchair Mobility    Modified Rankin  (Stroke Patients Only)       Balance Overall balance assessment: Needs assistance Sitting-balance support: Feet supported Sitting balance-Leahy Scale: Good     Standing balance support: During functional activity;No upper extremity supported Standing balance-Leahy Scale: Fair Standing balance comment: requires at least one hand to balance during ADL or leans on sink                    Cognition Arousal/Alertness: Awake/alert Behavior During Therapy: WFL for tasks assessed/performed Overall Cognitive Status: Within Functional Limits for tasks assessed                      Exercises      General Comments        Pertinent Vitals/Pain Pain Assessment: Faces Pain Score: 6  Faces Pain Scale: Hurts a little bit Pain Location: L heel and incision sites Pain Descriptors / Indicators: Operative site guarding;Sore Pain Intervention(s): Monitored during session;Repositioned    Home Living                      Prior Function            PT Goals (current goals can now be found in the care plan section) Acute Rehab PT Goals Patient Stated Goal: return home PT Goal Formulation: With patient Time For Goal Achievement: 04/27/16 Potential to Achieve Goals: Good Progress towards PT goals: Progressing toward goals    Frequency    Min 3X/week      PT Plan Current plan remains appropriate    Co-evaluation  End of Session Equipment Utilized During Treatment: Gait belt Activity Tolerance: Patient limited by pain;Patient limited by fatigue Patient left: in chair;with call bell/phone within reach;with family/visitor present Nurse Communication: Mobility status PT Visit Diagnosis: Other abnormalities of gait and mobility (R26.89);Pain Pain - Right/Left: Left Pain - part of body: Leg     Time: 1205-1220 PT Time Calculation (min) (ACUTE ONLY): 15 min  Charges:  $Gait Training: 8-22 mins                    G CodesAlessandra Bevels Reo Portela 04/14/2016, 12:51 PM Deborah Chalk, PT, DPT [phone]

## 2016-04-14 NOTE — Care Management Important Message (Signed)
Important Message  Patient Details  Name: Caitlin Vasquez MRN: [id number] Date of Birth: [birth year]   Medicare Important Message Given:  Yes    Kyla BalzarineShealy, Sharina Petre Abena 04/14/2016, 3:22 PM

## 2016-04-14 NOTE — Progress Notes (Signed)
VASCULAR LAB PRELIMINARY  ARTERIAL  ABI completed: Right ABI indicates severe arterial disease. Left ABI indicates moderate arterial disease.    RIGHT    LEFT    PRESSURE WAVEFORM  PRESSURE WAVEFORM  BRACHIAL 118 Tri BRACHIAL 124 Tri         AT 52 Damp mono AT 89 Damp mono  PT 45 Damp mono PT Could not obtain due to position/ bandages   PER   PER 81 Damp mono  GREAT TOE  NA GREAT TOE  NA    RIGHT LEFT  ABI 0.42 0.72    Farrel DemarkJill Eunice, RDMS, RVT   04/14/2016, 2:57 PM

## 2016-04-14 NOTE — Progress Notes (Signed)
[facility] to [facility] Bladder scanned patient >999. Patient stated she always have this problem with urinary retention even at home. Notified MD on call. New order to place foley for acute urinary retention. SWOT RN place with no difficulty using sterile technique. Patient tolerate well. Foley draining clear yellow urine. Will continue to monitor.

## 2016-04-14 NOTE — Care Management Note (Signed)
Case Management Note  Patient Details  Name: Caitlin Vasquez J Mutch MRN: [id number] Date of Birth: 1945-11-05  Subjective/Objective:    Previous CM noted that PT recommended home health therapy.  Discussed same with pt who declines - [facility] and she does not need further therapy.  Advised pt that PCP could order home health services if she changes her mind after she is discharged.                                   Expected Discharge Plan:  Home/Self Care  Discharge planning Services  CM Consult  HH Arranged:  Patient Refused   Status of Service:  Completed, signed off  Magdalene RiverMayo, Francee Setzer T, [place]RN 04/14/2016, 12:54 PM

## 2016-04-14 NOTE — Progress Notes (Addendum)
  [facility] Progress Note  Subjective  - POD #1  Left leg feels ok. Walked some in the room yesterday.  Objective Vitals:   04/13/16 2323 04/14/16 0400  BP: (!) 147/63 125/65  Pulse: 68 79  Resp: 16 15  Temp: 98.3 F (36.8 C) 97.4 F (36.3 C)    Intake/Output Summary (Last 24 hours) at 04/14/16 0735 Last data filed at 04/14/16 0559  Gross per 24 hour  Intake              770 ml  Output             4680 ml  Net            -3910 ml    Left groin and leg incisions clean Palpable left DP pulse Dry dressing left heel  Assessment/Planning: 71 y.o. female is s/p: Left common femoral/profunda femoral artery to below-the-knee popliteal artery bypass with ipsilateral non-reversed greater saphenous vein. Bilateral common iliac stents.  2 Days Post-Op   Left bypass graft patent. Palpable DP pulse. Continue wound care to left heel. Keep heels floated off bed. Continue ASA and Plavix.  Continue to mobilize.  Ok to transfer to 2W.   Raymond GurneyKimberly A Trinh 04/14/2016 7:35 AM --  Laboratory CBC    Component Value Date/Time   WBC 11.9 (H) 04/12/2016 2359   HGB 8.7 (L) 04/12/2016 2359   HCT 26.2 (L) 04/12/2016 2359   PLT 231 04/12/2016 2359    BMET    Component Value Date/Time   NA 134 (L) 04/12/2016 2359   K 4.4 04/12/2016 2359   CL 102 04/12/2016 2359   CO2 22 04/12/2016 2359   GLUCOSE 135 (H) 04/12/2016 2359   BUN 11 04/12/2016 2359   CREATININE 0.60 04/12/2016 2359   CALCIUM 8.2 (L) 04/12/2016 2359   GFRNONAA >60 04/12/2016 2359   GFRAA >60 04/12/2016 2359    COAG Lab Results  Component Value Date   INR 1.11 04/12/2016   INR 1.03 11/28/2014   No results found for: PTT  Antibiotics Anti-infectives    Start     Dose/Rate Route Frequency Ordered Stop   04/13/16 0030  cefUROXime (ZINACEF) 1.5 g in dextrose 5 % 50 mL IVPB     1.5 g 100 mL/hr over 30 Minutes Intravenous Every 12 hours 04/12/16 1903 04/13/16 1308   04/12/16 1023  dextrose 5 %  with cefUROXime (ZINACEF) ADS Med    Comments:  Rogelia MireMichael, Cynthia   : cabinet override      04/12/16 1023 04/12/16 1229   04/11/16 0800  cefUROXime (ZINACEF) 1.5 g in dextrose 5 % 50 mL IVPB     1.5 g 100 mL/hr over 30 Minutes Intravenous To Short Stay 04/11/16 0745 04/12/16 0800       Maris BergerKimberly Trinh, PA-C [facility] Office: [phone] Pager: [phone] 04/14/2016 7:35 AM  Addendum  I have independently interviewed and examined the patient, and I agree with the physician assistant's findings.  Home once ambulation and pain control better and home services set up.  Leonides SakeBrian Delonte Musich, MD, FACS [facility] [place] Office: [phone] Pager: [phone]  04/14/2016, 10:37 AM

## 2016-04-14 NOTE — Progress Notes (Signed)
Ref: Caitlin Vasquez,[name], MD   Subjective:  Awake. No chest pain. Ambulated some yesterday.  Objective:  Vital Signs in the last 24 hours: Temp:  [97.3 F (36.3 C)-98.3 F (36.8 C)] 98.1 F (36.7 C) (03/08 1947) Pulse Rate:  [68-117] 79 (03/08 1947) Cardiac Rhythm: Normal sinus rhythm (03/08 1950) Resp:  [15-26] 20 (03/08 1947) BP: (108-147)/(50-71) 108/50 (03/08 1947) SpO2:  [97 %-100 %] 98 % (03/08 1947) Weight:  [47.6 kg (105 lb)] 47.6 kg (105 lb) (03/08 0400)  Physical Exam: BP Readings from Last 1 Encounters:  04/14/16 (!) 108/50    Wt Readings from Last 1 Encounters:  04/14/16 47.6 kg (105 lb)    Weight change: 0.85 kg (1 lb 14 oz) Body mass index is 18.02 kg/m. HEENT: [place]/AT, Eyes-Hazel, PERL, EOMI, Conjunctiva-Pale Pink, Sclera-Non-icteric Neck: No JVD, No bruit, Trachea midline. Lungs:  Clear, Bilateral. Cardiac:  Regular rhythm, normal S1 and S2, no S3. II/VI systolic murmur. Abdomen:  Soft, non-tender. BS present. Extremities:  No edema present. No cyanosis. No clubbing. CNS: AxOx3, Cranial nerves grossly intact, moves all 4 extremities.  Skin: Warm and dry.   Intake/Output from previous day: 03/07 0701 - 03/08 0700 In: 770 [P.O.:720; IV Piggyback:50] Out: 4680 [Urine:4680]    Lab Results: BMET    Component Value Date/Time   NA 135 04/14/2016 1021   NA 134 (L) 04/12/2016 2359   NA 136 04/12/2016 0227   K 3.7 04/14/2016 1021   K 4.4 04/12/2016 2359   K 3.9 04/12/2016 0227   CL 100 (L) 04/14/2016 1021   CL 102 04/12/2016 2359   CL 105 04/12/2016 0227   CO2 26 04/14/2016 1021   CO2 22 04/12/2016 2359   CO2 24 04/12/2016 0227   GLUCOSE 127 (H) 04/14/2016 1021   GLUCOSE 135 (H) 04/12/2016 2359   GLUCOSE 98 04/12/2016 0227   BUN 11 04/14/2016 1021   BUN 11 04/12/2016 2359   BUN 10 04/12/2016 0227   CREATININE 0.68 04/14/2016 1021   CREATININE 0.60 04/12/2016 2359   CREATININE 0.63 04/12/2016 0227   CALCIUM 8.3 (L) 04/14/2016 1021   CALCIUM 8.2 (L)  04/12/2016 2359   CALCIUM 8.5 (L) 04/12/2016 0227   GFRNONAA >60 04/14/2016 1021   GFRNONAA >60 04/12/2016 2359   GFRNONAA >60 04/12/2016 0227   GFRAA >60 04/14/2016 1021   GFRAA >60 04/12/2016 2359   GFRAA >60 04/12/2016 0227   CBC    Component Value Date/Time   WBC 9.8 04/14/2016 1021   RBC 3.11 (L) 04/14/2016 1021   HGB 8.7 (L) 04/14/2016 1021   HCT 26.9 (L) 04/14/2016 1021   PLT 285 04/14/2016 1021   MCV 86.5 04/14/2016 1021   MCH 28.0 04/14/2016 1021   MCHC 32.3 04/14/2016 1021   RDW 16.5 (H) 04/14/2016 1021   LYMPHSABS 1.0 04/14/2016 1021   MONOABS 0.7 04/14/2016 1021   EOSABS 0.1 04/14/2016 1021   BASOSABS 0.0 04/14/2016 1021   HEPATIC Function Panel  Recent Labs  04/06/16 1456 04/12/16 0227  PROT 7.7 5.7*   HEMOGLOBIN A1C No components found for: HGA1C,  MPG CARDIAC ENZYMES Lab Results  Component Value Date   TROPONINI <0.03 04/07/2016   TROPONINI <0.03 04/06/2016   TROPONINI <0.03 04/06/2016   BNP No results for input(s): PROBNP in the last 8760 hours. TSH No results for input(s): TSH in the last 8760 hours. CHOLESTEROL No results for input(s): CHOL in the last 8760 hours.  Scheduled Meds: . amLODipine  10 mg Oral Daily  .  aspirin EC  81 mg Oral Daily  . atorvastatin  20 mg Oral Once per day on Mon Wed Fri  . carvedilol  12.5 mg Oral BID WC  . Chlorhexidine Gluconate Cloth  6 each Topical Daily  . clopidogrel  75 mg Oral Daily  . collagenase   Topical Daily  . docusate sodium  100 mg Oral Daily  . enoxaparin (LOVENOX) injection  30 mg Subcutaneous Q24H  . feeding supplement (ENSURE ENLIVE)  237 mL Oral BID BM  . ferrous sulfate  325 mg Oral TID WC  . mouth rinse  15 mL Mouth Rinse BID  . mupirocin ointment  1 application Nasal BID  . pantoprazole  40 mg Oral Q0600  . spironolactone  12.5 mg Oral Daily   Continuous Infusions: . lactated ringers Stopped (04/12/16 1615)   PRN Meds:.sodium chloride, acetaminophen, alum & mag hydroxide-simeth,  bisacodyl, guaiFENesin-dextromethorphan, hydrALAZINE, HYDROcodone-acetaminophen, labetalol, levalbuterol, magnesium sulfate 1 - 4 g bolus IVPB, metoprolol, morphine injection, ondansetron (ZOFRAN) IV, oxyCODONE-acetaminophen, phenol, potassium chloride, senna-docusate  Assessment/Plan: Left common femoral/Profunda artery to below the knee popliteal artery bypass Bilateral common iliac artery stenting COPD with pulmonary fibrosis Anemia of acute blood loss Hypertension Moderate protein calorie malnutrition CAD Moderate to severe bilateral lower leg peripheral artery disease  Continue Aspirin and plavix. Increase activity.   LOS: 8 days    Caitlin Cobb  MD  04/14/2016, 10:46 PM

## 2016-04-15 ENCOUNTER — Telehealth: Payer: Self-pay | Admitting: Vascular Surgery

## 2016-04-15 MED ORDER — CLOPIDOGREL BISULFATE 75 MG PO TABS: 75.0000 mg | ORAL_TABLET | Freq: Every day | ORAL | 3 refills | Status: DC

## 2016-04-15 MED ORDER — MUPIROCIN 2 % EX OINT: 1.0000 "application " | TOPICAL_OINTMENT | Freq: Two times a day (BID) | CUTANEOUS | 0 refills | Status: DC

## 2016-04-15 MED ORDER — ENSURE ENLIVE PO LIQD: 237.0000 mL | Freq: Two times a day (BID) | ORAL | 12 refills | Status: DC

## 2016-04-15 MED ORDER — COLLAGENASE 250 UNIT/GM EX OINT: TOPICAL_OINTMENT | Freq: Every day | CUTANEOUS | 0 refills | Status: DC

## 2016-04-15 MED ORDER — FERROUS SULFATE 325 (65 FE) MG PO TABS: 325.0000 mg | ORAL_TABLET | Freq: Two times a day (BID) | ORAL | 3 refills | Status: DC

## 2016-04-15 MED ORDER — PANTOPRAZOLE SODIUM 40 MG PO TBEC: 40.0000 mg | DELAYED_RELEASE_TABLET | Freq: Every day | ORAL | 3 refills | Status: DC

## 2016-04-15 MED ORDER — OXYCODONE HCL 5 MG PO TABS: 5.0000 mg | ORAL_TABLET | Freq: Four times a day (QID) | ORAL | 0 refills | Status: DC | PRN

## 2016-04-15 MED ORDER — ATORVASTATIN CALCIUM 20 MG PO TABS: 20.0000 mg | ORAL_TABLET | ORAL | 3 refills | Status: DC

## 2016-04-15 MED ORDER — POTASSIUM CHLORIDE CRYS ER 10 MEQ PO TBCR: 10.0000 meq | EXTENDED_RELEASE_TABLET | Freq: Every day | ORAL | 3 refills | Status: DC

## 2016-04-15 MED ORDER — ASPIRIN 81 MG PO TBEC: 81.0000 mg | DELAYED_RELEASE_TABLET | Freq: Every day | ORAL | 3 refills | Status: DC

## 2016-04-15 MED ORDER — DOCUSATE SODIUM 100 MG PO CAPS: 100.0000 mg | ORAL_CAPSULE | Freq: Every day | ORAL | 3 refills | Status: DC

## 2016-04-15 MED ORDER — COLLAGENASE 250 UNIT/GM EX OINT: TOPICAL_OINTMENT | Freq: Every day | CUTANEOUS | 2 refills | Status: DC

## 2016-04-15 MED ORDER — CARVEDILOL 12.5 MG PO TABS: 12.5000 mg | ORAL_TABLET | Freq: Two times a day (BID) | ORAL | 3 refills | Status: DC

## 2016-04-15 NOTE — Care Management Note (Signed)
Case Management Note  Patient Details  Name: Caitlin Vasquez MRN: [id number] Date of Birth: 1945/07/21  Subjective/Objective:   CM again discussed home health services and PT recommendations.  Pt again refused.  Her friend that is in the room stated she visits at least every other day and works with pt on exercising and building strength. Pt aware that her PCP can order home health services if she changes her mind when she returns home.                            Expected Discharge Plan:  Home/Self Care  Discharge planning Services  CM Consult  HH Arranged:  Patient Refused   Status of Service:  Completed, signed off  Magdalene RiverMayo, Niurka Benecke T, [place]RN 04/15/2016, 1:47 PM

## 2016-04-15 NOTE — Discharge Summary (Signed)
Physician Discharge Summary  Patient ID: Caitlin Vasquez MRN: [id number] DOB/AGE: Dec 26, 1945 71 y.o.  Admit date: 04/06/2016 Discharge date: 04/15/2016  Admission Diagnoses: Acute left systolic heart failure CAD R/O DVT of left lower leg End stage COPD Bilateral leg edema Left heel ulcer Hypertension  Discharge Diagnoses:  Principal Problem:   Acute left diastolic heart failure (HCC) Active Problems:   Exertional dyspnea   COPD with chronic bronchitis and emphysema (HCC)   Essential hypertension   CAD   Left leg DVT ruled out   Pulmonary Embolism ruled out   Malnutrition of moderate degree   Left common femoral/Profunda artery to below the knee Popliteal artery bypass   Bilateral common iliac arteries stenting   Anemia of acute blood loss   Moderate to severe bilateral lower leg peripheral vascular disease   Chronic left heel ulcer   Right lower lobe pulmonary nodules   DM, II, diet controlled  Discharged Condition: fair  Hospital Course: 71 year old female presented with shortness of breath and leg edema. She had left heel ulcer with severe lower leg and foot pain and decreased circulation. Her D-dimer was elevated but CT angio-chest was negative for PE but showed severe emphysema and two right lower lobe pulmonary nodules. Her echocardiogram was negative for systolic heart failure.  Her lower leg ultrasound was negative for DVT but had severe arterial disease on ABI. Vascular surgery consult and with lower leg arteriography resulted in bilateral common Iliac arteries stenting. This was followed by Left common Femoral/Profunda artery to below the knee Popliteal artery bypass surgery with significant improvement in left lower leg circulation. Patient requested discharge home instead of SNF. She will follow with me in 1 week and vascular surgeon in 2 weeks. She was made aware of repeat CT chest without contrast in 3-6 months for her pulmonary nodules.  Consults: vascular  surgery  Significant Diagnostic Studies: labs: BMET essentially unremarkable. CBC unremarkble with Hgb of 11.4 and requiring 2 units of PRBC for Hgb of 7 following bilateral common Iliac artery stenting. D-dimer was elevated at 1.86  EKG-Sinus rhythm with PAC.  Chest x-ray: Bilateral pulmonary fibrosis.   CT angio chest showed: No PE, severe centrilobular emphysema, two right lower lobe pulmonary nodules, small mediastinal lymph nodes, calcific aorta and coronary atherosclerosis.  Echocardiogram: Mild LVH with preserved LV systolic function and mild diastolic dysfunction.  Vascular US lower extremity was negative for DVT.  ABI right leg severe reduction in arterial flow and left ABI more severe reduction in arterial flow.  Post surgery, right ABI was 0.42 and left ABI was 0.72.  Carotid duplex showed moderate bilateral ECA stenosis, Right ICa and mild left ICA stenosis.  Treatments: cardiac meds: carvedilol, amlodipine and atorvastatin, anticoagulation: ASA and Plavix and surgery: Bilateral common Iliac artery stenting and left common Femoral/Profunda artery to below the knee Popliteal artery bypass using saphaneous vein.  Discharge Exam: Blood pressure 126/62, pulse 79, temperature 98.1 F (36.7 C), temperature source Oral, resp. rate 15, height 5\' 4"  (1.626 m), weight 47.9 kg (105 lb 9.6 oz), SpO2 94 %. General appearance: alert, cooperative and appears stated age Head: Normocephalic, atraumatic. Eyes: Hazel eyes, Pale pink conjunctivae/corneas clear. PERRL, EOM's intact.  Neck: no adenopathy, no carotid bruit, no JVD, supple, symmetrical, trachea midline and thyroid not enlarged. Resp: clear to auscultation bilaterally. Chronic oxygen use with nasal cannula. Cardio: regular rate and rhythm, S1, S2 normal, II/VI systolic murmur, no click, rub or gallop GI: soft, non-tender; bowel sounds normal; no  masses,  no organomegaly Extremities: Left heel ulcer with dressing with Santyl  ointment. Left foot warmer than before. No edema. Skin: Warm and dry.  Neurologic: Alert and oriented X 3, normal strength and tone. Normal coordination and slow gait.  Disposition: 01-Home or Self Care  Discharge Instructions    Discharge wound care:    Complete by:  As directed    Wash groin and left leg wounds daily with soap and water and pat dry. Keep incisions clean and dry. Do not put any creams or ointments on your incisions.  Apply santyl to left wound daily and cover with dry gauze. Apply gauze wrap on top to keep in place.     Allergies as of 04/15/2016      Reactions   Clonidine Derivatives Other (See Comments)   hypotension      Medication List    TAKE these medications   albuterol 108 (90 Base) MCG/ACT inhaler Commonly known as:  PROVENTIL HFA;VENTOLIN HFA Inhale 2 puffs into the lungs every 6 (six) hours as needed for wheezing or shortness of breath.   ALPRAZolam 0.25 MG tablet Commonly known as:  XANAX Take 0.25 mg by mouth at bedtime as needed for anxiety.   amLODipine 10 MG tablet Commonly known as:  NORVASC Take 10 mg by mouth daily.   aspirin 81 MG EC tablet Take 1 tablet (81 mg total) by mouth daily.   atorvastatin 20 MG tablet Commonly known as:  LIPITOR Take 1 tablet (20 mg total) by mouth 3 (three) times a week. What changed:  additional instructions   CALCIUM PO Take 1 tablet by mouth daily.   carvedilol 12.5 MG tablet Commonly known as:  COREG Take 1 tablet (12.5 mg total) by mouth 2 (two) times daily with a meal. What changed:  medication strength  how much to take   clopidogrel 75 MG tablet Commonly known as:  PLAVIX Take 1 tablet (75 mg total) by mouth daily.   collagenase ointment Commonly known as:  SANTYL Apply topically daily. Apply to left heel daily and cover with dry gauze. Use gauze wrap on top to keep in place.   docusate sodium 100 MG capsule Commonly known as:  COLACE Take 1 capsule (100 mg total) by mouth daily.    feeding supplement (ENSURE ENLIVE) Liqd Take 237 mLs by mouth 2 (two) times daily between meals.   ferrous sulfate 325 (65 FE) MG tablet Take 1 tablet (325 mg total) by mouth 2 (two) times daily with a meal.   ipratropium 0.02 % nebulizer solution Commonly known as:  ATROVENT Take 0.5 mg by nebulization every 6 (six) hours as needed for shortness of breath.   losartan 100 MG tablet Commonly known as:  COZAAR Take 100 mg by mouth daily.   mupirocin ointment 2 % Commonly known as:  BACTROBAN Place 1 application into the nose 2 (two) times daily.   oxyCODONE 5 MG immediate release tablet Commonly known as:  Oxy IR/ROXICODONE Take 1-2 tablets (5-10 mg total) by mouth every 6 (six) hours as needed for severe pain.   pantoprazole 40 MG tablet Commonly known as:  PROTONIX Take 1 tablet (40 mg total) by mouth daily.   potassium chloride 10 MEQ tablet Commonly known as:  K-DUR,KLOR-CON Take 1 tablet (10 mEq total) by mouth daily.   spironolactone 25 MG tablet Commonly known as:  ALDACTONE Take 12.5 mg by mouth daily.   STIOLTO RESPIMAT 2.5-2.5 MCG/ACT Aers Generic drug:  Tiotropium Bromide-Olodaterol Inhale 2  puffs into the lungs daily.   Vitamin D (Ergocalciferol) 50000 units Caps capsule Commonly known as:  DRISDOL Take 50,000 Units by mouth every 7 (seven) days.      Follow-up Information    Leonides Sake, MD Follow up in 2 week(s).   Specialties:  Vascular Surgery, Cardiology Why:  Our office will call you to arrange an appointment  Contact information: [address] [place] [address] [phone]        [facility] S, MD. Schedule an appointment as soon as possible for a visit in 1 week(s).   Specialty:  Cardiology Contact information: [address] [place] [address] [phone]           Signed: Ricki Rodriguez 04/15/2016, 9:48 AM

## 2016-04-15 NOTE — Progress Notes (Signed)
Pt d/C home per MD order, pt VSS, pt tol well,d/c instructions given, all questions answered, Foley d/c pt educated to call Md office in am if had not voided

## 2016-04-15 NOTE — Progress Notes (Signed)
   Daily Progress Note    RIGHT    LEFT    PRESSURE WAVEFORM  PRESSURE WAVEFORM  BRACHIAL 118 Tri BRACHIAL 124 Tri         AT 52 Damp mono AT 89 Damp mono  PT 45 Damp mono PT Could not obtain due to position/ bandages   PER   PER 81 Damp mono  GREAT TOE  NA GREAT TOE  NA    RIGHT LEFT  ABI 0.42 0.72    Preop L ABI was 0.27  - combination inflow and outflow procedures demonstrate significant augmentation in flow in L leg   Leonides SakeBrian Adreona Brand, MD, FACS [facility] [place] Office: [phone] Pager: [phone]  04/15/2016, 4:20 AM

## 2016-04-15 NOTE — Telephone Encounter (Signed)
-----   Message from Sharee PimpleMarilyn K McChesney, RN sent at 04/15/2016  9:44 AM EST ----- Regarding: 2 weeks   ----- Message ----- From: Raymond GurneyKimberly A Trinh, PA-C Sent: 04/15/2016   7:55 AM To: Vvs Charge Pool  S/p Left common femoral/profunda femoral artery to below-the-knee popliteal artery bypass with ipsilateral non-reversed greater saphenous vein. Bilateral common iliac stents 04/12/16  F/u in 2 weeks with Dr. Imogene Burnhen  Thanks Selena BattenKim

## 2016-04-15 NOTE — Progress Notes (Addendum)
  [facility] Progress Note  Subjective    Walked some yesterday. Says hard to walk with wound on left heel.   Objective Vitals:   04/14/16 2308 04/15/16 0437  BP: 127/60 131/67  Pulse: 86   Resp: 18   Temp: 98.2 F (36.8 C) 98 F (36.7 C)    Intake/Output Summary (Last 24 hours) at 04/15/16 0743 Last data filed at 04/15/16 0648  Gross per 24 hour  Intake              240 ml  Output             2200 ml  Net            -1960 ml   Incisions clean and intact left. 2+ left DP pulse.  Assessment/Planning: 71 y.o. female is s/p: Leftcommon femoral/profunda femoral artery to below-the-knee popliteal artery bypass with ipsilateral non-reversed greater saphenous vein. Bilateral common iliac stents.  3 Days Post-Op   Left leg ABIs improved from pre-op.  Continue wound care left foot and continue mobilization.  Continue aspirin and statin.  Wants to go home today. Says she has family support at home.  Home health ordered. D/c today if ok with primary.   Raymond GurneyKimberly A Trinh 04/15/2016 7:43 AM --  Laboratory CBC    Component Value Date/Time   WBC 9.8 04/14/2016 1021   HGB 8.7 (L) 04/14/2016 1021   HCT 26.9 (L) 04/14/2016 1021   PLT 285 04/14/2016 1021    BMET    Component Value Date/Time   NA 135 04/14/2016 1021   K 3.7 04/14/2016 1021   CL 100 (L) 04/14/2016 1021   CO2 26 04/14/2016 1021   GLUCOSE 127 (H) 04/14/2016 1021   BUN 11 04/14/2016 1021   CREATININE 0.68 04/14/2016 1021   CALCIUM 8.3 (L) 04/14/2016 1021   GFRNONAA >60 04/14/2016 1021   GFRAA >60 04/14/2016 1021    COAG Lab Results  Component Value Date   INR 1.11 04/12/2016   INR 1.03 11/28/2014   No results found for: PTT  Antibiotics Anti-infectives    Start     Dose/Rate Route Frequency Ordered Stop   04/13/16 0030  cefUROXime (ZINACEF) 1.5 g in dextrose 5 % 50 mL IVPB     1.5 g 100 mL/hr over 30 Minutes Intravenous Every 12 hours 04/12/16 1903 04/13/16 1308   04/12/16 1023   dextrose 5 % with cefUROXime (ZINACEF) ADS Med    Comments:  Rogelia MireMichael, Cynthia   : cabinet override      04/12/16 1023 04/12/16 1229   04/11/16 0800  cefUROXime (ZINACEF) 1.5 g in dextrose 5 % 50 mL IVPB     1.5 g 100 mL/hr over 30 Minutes Intravenous To Short Stay 04/11/16 0745 04/12/16 0800       Maris BergerKimberly Trinh, PA-C [facility] Office: [phone] Pager: [phone] 04/15/2016 7:43 AM   Addendum  I have independently interviewed and examined the patient, and I agree with the physician assistant's findings.  Ok to discharge if all home arrangement: PT and wound care setup.  F/U in office for wound check in 2 weeks  Leonides SakeBrian Miakoda Mcmillion, MD, [facility] [place] Office: [phone] Pager: [phone]  04/15/2016, 9:57 AM

## 2016-04-15 NOTE — Telephone Encounter (Signed)
LVM on home # for appt on 3/23, mailed letter

## 2016-04-15 NOTE — Progress Notes (Signed)
Occupational Therapy Treatment Patient Details Name: Caitlin Vasquez MRN: [id number] DOB: [birth year] Today's Date: 04/15/2016    History of present illness Pt is a 71 y/o female s/p L fem-pop bypass after admission withL LE pain and non healing wound. PMH: COPD on 2L 02 at home, CHF, HTN, CAD, DM, L hip fx in Dec. 2017.   OT comments  Pt completed sponge bathing and dressing with min assist to supervision. Son and friend will assist at home. Pt is eager to return home today.  Follow Up Recommendations  No OT follow up    Equipment Recommendations  None recommended by OT    Recommendations for Other Services      Precautions / Restrictions Precautions Precautions: Fall Restrictions Weight Bearing Restrictions: No       Mobility Bed Mobility Overal bed mobility: Modified Independent         Sit to supine: Supervision   General bed mobility comments: no assist, HOB up  Transfers Overall transfer level: Needs assistance Equipment used: 1 person hand held assist Transfers: Sit to/from Stand Sit to Stand: Supervision         General transfer comment: supervision for safety    Balance Overall balance assessment: Needs assistance Sitting-balance support: Feet supported Sitting balance-Leahy Scale: Good     Standing balance support: During functional activity;No upper extremity supported Standing balance-Leahy Scale: Fair                     ADL Overall ADL's : Needs assistance/impaired     Grooming: Wash/dry face;Wash/dry hands;Sitting;Set up   Upper Body Bathing: Set up;Sitting   Lower Body Bathing: Minimal assistance;Sit to/from stand   Upper Body Dressing : Set up;Sitting   Lower Body Dressing: Sit to/from stand;Minimal assistance Lower Body Dressing Details (indicate cue type and reason): assist for L sock     Toileting- Clothing Manipulation and Hygiene: Supervision/safety;Sit to/from stand         General ADL Comments: Pt used bath  wipes from home.      Vision                     Perception     Praxis      Cognition   Behavior During Therapy: WFL for tasks assessed/performed Overall Cognitive Status: Within Functional Limits for tasks assessed                         Exercises    Shoulder Instructions       General Comments      Pertinent Vitals/ Pain       Pain Assessment: Faces Pain Score: 2  Faces Pain Scale: Hurts little more Pain Location: L heel and incision sites Pain Descriptors / Indicators: Operative site guarding;Sore Pain Intervention(s): Monitored during session;Repositioned  Home Living                                          Prior Functioning/Environment              Frequency  Min 2X/week        Progress Toward Goals  OT Goals(current goals can now be found in the care plan section)  Progress towards OT goals: Progressing toward goals  Acute Rehab OT Goals Patient Stated Goal: return home Time For Goal Achievement: 04/20/16 Potential to Achieve  Goals: Good  Plan Discharge plan remains appropriate    Co-evaluation                 End of Session Equipment Utilized During Treatment: Oxygen  OT Visit Diagnosis: Unsteadiness on feet (R26.81);Pain;History of falling (Z91.81) Pain - Right/Left: Left Pain - part of body: Leg   Activity Tolerance Patient tolerated treatment well   Patient Left in bed;with call bell/phone within reach   Nurse Communication  (asked case manager for FMLA letter for son)        Time: 1610-9604 OT Time Calculation (min): 21 min  Charges: OT General Charges $OT Visit: 1 Procedure OT Treatments $Self Care/Home Management : 8-22 mins    Evern Bio 04/15/2016, 11:35 AM  [phone]

## 2016-04-15 NOTE — Progress Notes (Signed)
Physical Therapy Treatment Patient Details Name: Caitlin Vasquez MRN: [id number] DOB: Apr 02, 1945 Today's Date: 04/15/2016    History of Present Illness Pt is a 71 y/o female s/p L fem-pop bypass after admission withL LE pain and non healing wound. PMH: COPD on 2L 02 at home, CHF, HTN, CAD, DM, L hip fx in Dec. 2017.    PT Comments    Pt making slow progress with mobility. She continues to be limited secondary to pain and anxiety with ambulating "too far away from the room". All VSS throughout. Pt would continue to benefit from skilled physical therapy services at this time while admitted and after d/c to address her limitations in order to improve her overall safety and independence with functional mobility.     Follow Up Recommendations  Home health PT;Supervision/Assistance - 24 hour     Equipment Recommendations  None recommended by PT    Recommendations for Other Services       Precautions / Restrictions Precautions Precautions: Fall Restrictions Weight Bearing Restrictions: No    Mobility  Bed Mobility               General bed mobility comments: pt sitting OOB in recliner when PT entered room  Transfers Overall transfer level: Needs assistance Equipment used: 1 person hand held assist Transfers: Sit to/from Stand Sit to Stand: Min guard         General transfer comment: increased time, min guard for safety  Ambulation/Gait Ambulation/Gait assistance: Min assist Ambulation Distance (Feet): 40 Feet Assistive device: 1 person hand held assist Gait Pattern/deviations: Step-through pattern;Decreased step length - left;Decreased step length - right;Decreased stride length;Antalgic Gait velocity: decreased Gait velocity interpretation: Below normal speed for age/gender General Gait Details: min A for stability with one person HHA   Stairs            Wheelchair Mobility    Modified Rankin (Stroke Patients Only)       Balance Overall balance  assessment: Needs assistance Sitting-balance support: Feet supported Sitting balance-Leahy Scale: Good     Standing balance support: During functional activity;No upper extremity supported Standing balance-Leahy Scale: Fair                      Cognition Arousal/Alertness: Awake/alert Behavior During Therapy: WFL for tasks assessed/performed Overall Cognitive Status: Within Functional Limits for tasks assessed                      Exercises Other Exercises Other Exercises: sit-to-stands x10 with min guard    General Comments        Pertinent Vitals/Pain Pain Assessment: 0-10 Pain Score: 2  Pain Location: L heel and incision sites Pain Descriptors / Indicators: Operative site guarding;Sore Pain Intervention(s): Monitored during session;Repositioned    Home Living                      Prior Function            PT Goals (current goals can now be found in the care plan section) Acute Rehab PT Goals Patient Stated Goal: return home PT Goal Formulation: With patient Time For Goal Achievement: 04/27/16 Potential to Achieve Goals: Good Progress towards PT goals: Progressing toward goals    Frequency    Min 3X/week      PT Plan Current plan remains appropriate    Co-evaluation             End of Session  Equipment Utilized During Treatment: Gait belt;Oxygen Activity Tolerance: Patient limited by pain;Patient limited by fatigue Patient left: in chair;with call bell/phone within reach Nurse Communication: Mobility status PT Visit Diagnosis: Other abnormalities of gait and mobility (R26.89);Pain Pain - Right/Left: Left Pain - part of body: Leg     Time: [id number] PT Time Calculation (min) (ACUTE ONLY): 16 min  Charges:  $Gait Training: 8-22 mins                    G Codes:       Alessandra BevelsJennifer M Thurl Boen 04/15/2016, 9:58 AM Deborah ChalkJennifer Jameson Morrow, PT, DPT [phone]

## 2016-04-18 ENCOUNTER — Encounter: Payer: Self-pay | Admitting: Vascular Surgery

## 2016-04-25 NOTE — Progress Notes (Deleted)
    Postoperative Visit   History of Present Illness  Caitlin Vasquez is a 71 y.o. year old female who presents for postoperative follow-up for: L CFA/PFA to AK pop BPG w/ ips NR GSV (Date: 04/12/16).  The patient's wounds are *** healed.  The patient notes *** resolution of lower extremity symptoms.  The patient is *** able to complete their activities of daily living.  The patient's current symptoms are: ***.  For VQI Use Only  PRE-ADM LIVING: {VQI Pre-admission Living:20973}  AMB STATUS: {VQI Ambulatory Status:20974}  Physical Examination  There were no vitals filed for this visit. LLE: Incisions are *** healed, wounds are *** healed, pedal pulses are ***  Medical Decision Making  Caitlin Vasquez is a 71 y.o. year old female who presents s/p L CFA/PFA to AK pop BPG w/ ips NR GSV .  The patient's bypass incisions are *** healing appropriately with resolution of pre-operative symptoms. I discussed in depth with the patient the nature of atherosclerosis, and emphasized the importance of maximal medical management including strict control of blood pressure, blood glucose, and lipid levels, obtaining regular exercise, and cessation of smoking.  The patient is aware that without maximal medical management the underlying atherosclerotic disease process will progress, limiting the benefit of any interventions. The patient's surveillance will included ABI and bypass duplex studies which will be completed in: *** months, at which time the patient will be re-evaluated.   I emphasized the importance of routine surveillance of the patient's bypass, as the vascular surgery literature emphasize the improved patency possible with assisted primary patency procedures versus secondary patency procedures. The patient agrees to participate in their maximal medical care and routine surveillance.  Thank you for allowing [place] to participate in this patient's care.  Leonides Sake, MD, FACS [facility] [place] Office: [phone] Pager: [phone]

## 2016-04-29 ENCOUNTER — Encounter: Payer: Medicare Other | Admitting: Vascular Surgery

## 2016-05-12 ENCOUNTER — Encounter: Payer: Self-pay | Admitting: Vascular Surgery

## 2016-05-17 NOTE — Progress Notes (Signed)
Postoperative Visit   History of Present Illness  Caitlin Vasquez is a 71 y.o. year old female who presents for postoperative follow-up for: L CFA/PFA to AK pop BPG w/ ips NR GSV (Date: 04/12/16).  This was done for a L heel ulcer.  The patient's heel is nearly healed.  The patient notes great improvement in left leg pain.  The patient is able to complete their activities of daily living.  The patient's current symptoms are: left leg paraesthesia.  Current Outpatient Prescriptions  Medication Sig Dispense Refill  . albuterol (PROVENTIL HFA;VENTOLIN HFA) 108 (90 BASE) MCG/ACT inhaler Inhale 2 puffs into the lungs every 6 (six) hours as needed for wheezing or shortness of breath.    . ALPRAZolam (XANAX) 0.25 MG tablet Take 0.25 mg by mouth at bedtime as needed for anxiety.    Marland Kitchen amLODipine (NORVASC) 10 MG tablet Take 10 mg by mouth daily.    Marland Kitchen aspirin EC 81 MG EC tablet Take 1 tablet (81 mg total) by mouth daily. 30 tablet 3  . atorvastatin (LIPITOR) 20 MG tablet Take 1 tablet (20 mg total) by mouth 3 (three) times a week. 30 tablet 3  . CALCIUM PO Take 1 tablet by mouth daily.    . carvedilol (COREG) 12.5 MG tablet Take 1 tablet (12.5 mg total) by mouth 2 (two) times daily with a meal. 60 tablet 3  . clopidogrel (PLAVIX) 75 MG tablet Take 1 tablet (75 mg total) by mouth daily. 30 tablet 3  . collagenase (SANTYL) ointment Apply topically daily. Apply to left heel daily and cover with dry gauze. Use gauze wrap on top to keep in place. 15 g 2  . docusate sodium (COLACE) 100 MG capsule Take 1 capsule (100 mg total) by mouth daily. 30 capsule 3  . feeding supplement, ENSURE ENLIVE, (ENSURE ENLIVE) LIQD Take 237 mLs by mouth 2 (two) times daily between meals. 60 Bottle 12  . ferrous sulfate 325 (65 FE) MG tablet Take 1 tablet (325 mg total) by mouth 2 (two) times daily with a meal. 60 tablet 3  . ipratropium (ATROVENT) 0.02 % nebulizer solution Take 0.5 mg by nebulization every 6 (six) hours as  needed for shortness of breath.     . losartan (COZAAR) 100 MG tablet Take 100 mg by mouth daily.    . mupirocin ointment (BACTROBAN) 2 % Place 1 application into the nose 2 (two) times daily. 22 g 0  . oxyCODONE (OXY IR/ROXICODONE) 5 MG immediate release tablet Take 1-2 tablets (5-10 mg total) by mouth every 6 (six) hours as needed for severe pain. 30 tablet 0  . pantoprazole (PROTONIX) 40 MG tablet Take 1 tablet (40 mg total) by mouth daily. 30 tablet 3  . potassium chloride SA (K-DUR,KLOR-CON) 10 MEQ tablet Take 1 tablet (10 mEq total) by mouth daily. 30 tablet 3  . spironolactone (ALDACTONE) 25 MG tablet Take 12.5 mg by mouth daily.    . Tiotropium Bromide-Olodaterol (STIOLTO RESPIMAT) 2.5-2.5 MCG/ACT AERS Inhale 2 puffs into the lungs daily.     . Vitamin D, Ergocalciferol, (DRISDOL) 50000 units CAPS capsule Take 50,000 Units by mouth every 7 (seven) days.     No current facility-administered medications for this visit.     For VQI Use Only  PRE-ADM LIVING: Home  AMB STATUS: Ambulatory with Assistance   Physical Examination  Vitals:   05/20/16 1034  BP: 109/61  Pulse: 83  Resp: 20  Temp: 97.6 F (36.4 C)  LLE: Incisions are nearly healed, left heel nearly healed: shallow healing heel ulcer <1 cm (re-epithelization in process), L leg edema 2+, faintly palpable AT  Medical Decision Making  Caitlin Vasquez is a 71 y.o. year old female who presents s/p L CFA/PFA to AK pop BPG w/ ips NR GSV, B CIA PTA+S   The patient's bypass incisions are healing appropriately with resolution of pre-operative symptoms.  Left heel is healing appropriately.  I gave the patient and daughter some wound care instructions.  Left leg swelling is normal for a bypass with vein harvest.  Swelling can take 6-12 months to resolve.  The patient can wear a L leg compression stocking once the L heel heals.  Will check on this L heel wound in one month.  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 I discussed in depth with the patient the nature of atherosclerosis, and emphasized the importance of maximal medical management including strict control of blood pressure, blood glucose, and lipid levels, obtaining regular exercise, and cessation of smoking.  The patient is aware that without maximal medical management the underlying atherosclerotic disease process will progress, limiting the benefit of any interventions. The patient's surveillance will included ABI, aortoiliac, bypass duplex studies which will be completed in: 3 mon months, at which time the patient will be re-evaluated.   I emphasized the importance of routine surveillance of the patient's bypass, as the vascular surgery literature emphasize the improved patency possible with assisted primary patency procedures versus secondary patency procedures. The patient agrees to participate in their maximal medical care and routine surveillance. The patient is currently on a statin: Lipitor.  The patient is currently on an anti-platelet: ASA.  Thank you for allowing [place] to participate in this patient's care.  Leonides Sake, MD, FACS [facility] [place] Office: [phone] Pager: [phone]

## 2016-05-20 ENCOUNTER — Ambulatory Visit (INDEPENDENT_AMBULATORY_CARE_PROVIDER_SITE_OTHER): Payer: Self-pay | Admitting: Vascular Surgery

## 2016-05-20 ENCOUNTER — Encounter: Payer: Self-pay | Admitting: Vascular Surgery

## 2016-05-20 VITALS — BP 109/61 | HR 83 | Temp 97.6°F | Resp 20 | Ht 64.0 in | Wt 117.9 lb

## 2016-05-20 DIAGNOSIS — I70262 Atherosclerosis of native arteries of extremities with gangrene, left leg: Secondary | ICD-10-CM

## 2016-06-20 ENCOUNTER — Encounter: Payer: Self-pay | Admitting: Vascular Surgery

## 2016-06-20 NOTE — Progress Notes (Signed)
    Postoperative Visit   History of Present Illness   Caitlin Vasquez is a 71 y.o. year old female who presents for postoperative follow-up for: L CFA/PFA to AK pop BPG w/ ips NR GSV (Date: 04/12/16).  The patient's wounds are healed.  The patient notes resolution of prior lower extremity symptoms except persistent left lower leg para/anesthesia.  The patient is able to complete their activities of daily living with assistance.  The patient remains oxygen dependent.   For VQI Use Only   PRE-ADM LIVING: Home  AMB STATUS: Ambulatory   Physical Examination   Vitals:   06/24/16 1053  BP: 118/76  Pulse: 80  Resp: 18  Temp: (!) 96.6 F (35.9 C)  TempSrc: Oral  SpO2: 95%  Weight: 112 lb 4.8 oz (50.9 kg)  Height: 5\' 4"  (1.626 m)    LLE: Incisions are healed, wounds are healed, faintly palpable DP in L foot   Medical Decision Making   Caitlin Vasquez is a 71 y.o. year old female who presents s/p : L CFA/PFA to AK pop BPG w/ ips NR GSV (Date: 04/12/16) for left heel ulcer.   The patient's bypass incisions are healing appropriately with resolution of pre-operative symptoms. I discussed in depth with the patient the nature of atherosclerosis, and emphasized the importance of maximal medical management including strict control of blood pressure, blood glucose, and lipid levels, obtaining regular exercise, and cessation of smoking.  The patient is aware that without maximal medical management the underlying atherosclerotic disease process will progress, limiting the benefit of any interventions. The patient's surveillance will included ABI and bypass duplex studies which will be completed in: 3 months, at which time the patient will be re-evaluated.   I emphasized the importance of routine surveillance of the patient's bypass, as the vascular surgery literature emphasize the improved patency possible with assisted primary patency procedures versus secondary patency procedures. The patient  agrees to participate in their maximal medical care and routine surveillance. Thank you for allowing us to participate in this patient's care.   Leonides SakeBrian Nura Cahoon, MD, FACS [facility] [place] Office: [phone] Pager: [phone]

## 2016-06-24 ENCOUNTER — Encounter: Payer: Self-pay | Admitting: Vascular Surgery

## 2016-06-24 ENCOUNTER — Ambulatory Visit (INDEPENDENT_AMBULATORY_CARE_PROVIDER_SITE_OTHER): Payer: Self-pay | Admitting: Vascular Surgery

## 2016-06-24 VITALS — BP 118/76 | HR 80 | Temp 96.6°F | Resp 18 | Ht 64.0 in | Wt 112.3 lb

## 2016-06-24 DIAGNOSIS — I70262 Atherosclerosis of native arteries of extremities with gangrene, left leg: Secondary | ICD-10-CM

## 2016-06-27 NOTE — Addendum Note (Signed)
Addended by: Burton ApleyPETTY, Latorie Montesano A on: 06/27/2016 03:37 PM   Modules accepted: Orders

## 2016-08-16 DIAGNOSIS — W19XXXA Unspecified fall, initial encounter: Secondary | ICD-10-CM

## 2016-08-16 DIAGNOSIS — J441 Chronic obstructive pulmonary disease with (acute) exacerbation: Secondary | ICD-10-CM | POA: Diagnosis not present

## 2016-08-16 DIAGNOSIS — J9809 Other diseases of bronchus, not elsewhere classified: Secondary | ICD-10-CM

## 2016-08-16 DIAGNOSIS — I10 Essential (primary) hypertension: Secondary | ICD-10-CM | POA: Diagnosis not present

## 2016-08-16 DIAGNOSIS — I509 Heart failure, unspecified: Secondary | ICD-10-CM

## 2016-08-17 DIAGNOSIS — J441 Chronic obstructive pulmonary disease with (acute) exacerbation: Secondary | ICD-10-CM | POA: Diagnosis not present

## 2016-08-17 DIAGNOSIS — J9809 Other diseases of bronchus, not elsewhere classified: Secondary | ICD-10-CM | POA: Diagnosis not present

## 2016-08-17 DIAGNOSIS — I10 Essential (primary) hypertension: Secondary | ICD-10-CM | POA: Diagnosis not present

## 2016-08-17 DIAGNOSIS — I509 Heart failure, unspecified: Secondary | ICD-10-CM | POA: Diagnosis not present

## 2016-08-17 DIAGNOSIS — W19XXXA Unspecified fall, initial encounter: Secondary | ICD-10-CM | POA: Diagnosis not present

## 2016-08-18 DIAGNOSIS — I509 Heart failure, unspecified: Secondary | ICD-10-CM | POA: Diagnosis not present

## 2016-08-18 DIAGNOSIS — W19XXXA Unspecified fall, initial encounter: Secondary | ICD-10-CM | POA: Diagnosis not present

## 2016-08-18 DIAGNOSIS — J441 Chronic obstructive pulmonary disease with (acute) exacerbation: Secondary | ICD-10-CM | POA: Diagnosis not present

## 2016-08-18 DIAGNOSIS — I10 Essential (primary) hypertension: Secondary | ICD-10-CM | POA: Diagnosis not present

## 2016-08-18 DIAGNOSIS — J9809 Other diseases of bronchus, not elsewhere classified: Secondary | ICD-10-CM | POA: Diagnosis not present

## 2016-09-25 DIAGNOSIS — J441 Chronic obstructive pulmonary disease with (acute) exacerbation: Secondary | ICD-10-CM | POA: Diagnosis not present

## 2016-09-25 DIAGNOSIS — E875 Hyperkalemia: Secondary | ICD-10-CM | POA: Diagnosis not present

## 2016-09-25 DIAGNOSIS — Z79899 Other long term (current) drug therapy: Secondary | ICD-10-CM

## 2016-09-25 DIAGNOSIS — R339 Retention of urine, unspecified: Secondary | ICD-10-CM

## 2016-09-25 DIAGNOSIS — N289 Disorder of kidney and ureter, unspecified: Secondary | ICD-10-CM

## 2016-09-26 DIAGNOSIS — J441 Chronic obstructive pulmonary disease with (acute) exacerbation: Secondary | ICD-10-CM | POA: Diagnosis not present

## 2016-09-26 DIAGNOSIS — N289 Disorder of kidney and ureter, unspecified: Secondary | ICD-10-CM | POA: Diagnosis not present

## 2016-09-26 DIAGNOSIS — R339 Retention of urine, unspecified: Secondary | ICD-10-CM | POA: Diagnosis not present

## 2016-09-26 DIAGNOSIS — Z79899 Other long term (current) drug therapy: Secondary | ICD-10-CM | POA: Diagnosis not present

## 2016-09-26 DIAGNOSIS — E875 Hyperkalemia: Secondary | ICD-10-CM | POA: Diagnosis not present

## 2016-09-27 DIAGNOSIS — Z79899 Other long term (current) drug therapy: Secondary | ICD-10-CM | POA: Diagnosis not present

## 2016-09-27 DIAGNOSIS — E875 Hyperkalemia: Secondary | ICD-10-CM | POA: Diagnosis not present

## 2016-09-27 DIAGNOSIS — R339 Retention of urine, unspecified: Secondary | ICD-10-CM | POA: Diagnosis not present

## 2016-09-27 DIAGNOSIS — N289 Disorder of kidney and ureter, unspecified: Secondary | ICD-10-CM | POA: Diagnosis not present

## 2016-09-27 DIAGNOSIS — J441 Chronic obstructive pulmonary disease with (acute) exacerbation: Secondary | ICD-10-CM | POA: Diagnosis not present

## 2016-09-28 DIAGNOSIS — N289 Disorder of kidney and ureter, unspecified: Secondary | ICD-10-CM | POA: Diagnosis not present

## 2016-09-28 DIAGNOSIS — Z79899 Other long term (current) drug therapy: Secondary | ICD-10-CM | POA: Diagnosis not present

## 2016-09-28 DIAGNOSIS — R339 Retention of urine, unspecified: Secondary | ICD-10-CM | POA: Diagnosis not present

## 2016-09-28 DIAGNOSIS — E875 Hyperkalemia: Secondary | ICD-10-CM | POA: Diagnosis not present

## 2016-09-28 DIAGNOSIS — J441 Chronic obstructive pulmonary disease with (acute) exacerbation: Secondary | ICD-10-CM | POA: Diagnosis not present

## 2016-09-28 NOTE — Addendum Note (Signed)
Addendum  created 09/28/16 1209 by Elley Harp, MD   Sign clinical note    

## 2016-10-27 NOTE — Progress Notes (Signed)
Established Previous Bypass   History of Present Illness   Caitlin Vasquez is a 71 y.o. (09/26/1945) female who presents with chief complaint: weak from recent admission.  Previous operation(s) include:   1. L CFA/PFA to AK pop BPG w/ ips NR GSVfpr L foot wounds (Date: 04/12/16). 2. B CIA PTA+S (04/08/16) by Dr. Darrick Penna  The patient's symptoms have not progressed.  The patient recently was admitted to the hospital for fall fell related to UTI.  The patient has been in rehab for the last 20 days.  The patient's treatment regimen currently included: maximal medical management.  The patient has been ambulating with walker but came in today in wheelchair due to some weakness.  She is on home oxygen currently.  The patient's PMH, PSH, SH, and FamHx are unchanged from 06/24/16.  Current Outpatient Prescriptions  Medication Sig Dispense Refill  . albuterol (PROVENTIL HFA;VENTOLIN HFA) 108 (90 BASE) MCG/ACT inhaler Inhale 2 puffs into the lungs every 6 (six) hours as needed for wheezing or shortness of breath.    . ALPRAZolam (XANAX) 0.25 MG tablet Take 0.25 mg by mouth at bedtime as needed for anxiety.    Marland Kitchen amLODipine (NORVASC) 10 MG tablet Take 10 mg by mouth daily.    Marland Kitchen aspirin EC 81 MG EC tablet Take 1 tablet (81 mg total) by mouth daily. 30 tablet 3  . atorvastatin (LIPITOR) 20 MG tablet Take 1 tablet (20 mg total) by mouth 3 (three) times a week. 30 tablet 3  . CALCIUM PO Take 1 tablet by mouth daily.    . carvedilol (COREG) 12.5 MG tablet Take 1 tablet (12.5 mg total) by mouth 2 (two) times daily with a meal. 60 tablet 3  . clopidogrel (PLAVIX) 75 MG tablet Take 1 tablet (75 mg total) by mouth daily. 30 tablet 3  . collagenase (SANTYL) ointment Apply topically daily. Apply to left heel daily and cover with dry gauze. Use gauze wrap on top to keep in place. 15 g 2  . docusate sodium (COLACE) 100 MG capsule Take 1 capsule (100 mg total) by mouth daily. 30 capsule 3  . feeding supplement,  ENSURE ENLIVE, (ENSURE ENLIVE) LIQD Take 237 mLs by mouth 2 (two) times daily between meals. 60 Bottle 12  . ferrous sulfate 325 (65 FE) MG tablet Take 1 tablet (325 mg total) by mouth 2 (two) times daily with a meal. 60 tablet 3  . ipratropium (ATROVENT) 0.02 % nebulizer solution Take 0.5 mg by nebulization every 6 (six) hours as needed for shortness of breath.     . losartan (COZAAR) 100 MG tablet Take 100 mg by mouth daily.    . mupirocin ointment (BACTROBAN) 2 % Place 1 application into the nose 2 (two) times daily. 22 g 0  . oxyCODONE (OXY IR/ROXICODONE) 5 MG immediate release tablet Take 1-2 tablets (5-10 mg total) by mouth every 6 (six) hours as needed for severe pain. 30 tablet 0  . pantoprazole (PROTONIX) 40 MG tablet Take 1 tablet (40 mg total) by mouth daily. 30 tablet 3  . potassium chloride SA (K-DUR,KLOR-CON) 10 MEQ tablet Take 1 tablet (10 mEq total) by mouth daily. 30 tablet 3  . spironolactone (ALDACTONE) 25 MG tablet Take 12.5 mg by mouth daily.    . Tiotropium Bromide-Olodaterol (STIOLTO RESPIMAT) 2.5-2.5 MCG/ACT AERS Inhale 2 puffs into the lungs daily.     . Vitamin D, Ergocalciferol, (DRISDOL) 50000 units CAPS capsule Take 50,000 Units by mouth every 7 (seven)  days.     No current facility-administered medications for this visit.     On ROS today: +recent admission for fall related to UTI, no intermittent claudication    Physical Examination   Vitals:   10/28/16 1145  BP: (!) 146/74  Pulse: 72  Resp: 20  SpO2: 97%  Weight: 120 lb (54.4 kg)  Height:  (1.626 m)   Body mass index is 20.6 kg/m.  General Alert, O x 3, WD, Ill appearing  Pulmonary Sym exp, good B air movt, faint rales on B  Cardiac RRR, Nl S1, S2, no Murmurs, No rubs, No S3,S4  Vascular Vessel Right Left  Radial Palpable Palpable  Brachial Palpable Palpable  Carotid Palpable, No Bruit Palpable, No Bruit  Aorta Not palpable N/A  Femoral Palpable Palpable  Popliteal Not palpable Not  palpable  PT Not palpable Palpable  DP Not palpable Not palpable    Gastro- intestinal soft, non-distended, non-tender to palpation, No guarding or rebound, no HSM, no masses, no CVAT B, No palpable prominent aortic pulse,    Musculo- skeletal M/S 5/5 throughout  , Extremities without ischemic changes  , Non-pitting edema present: L 1-2+, incisions healed in L leg  Neurologic Pain and light touch intact in extremities , Motor exam as listed above    Non-Invasive Vascular Imaging ABI (10/28/2016)  R:   ABI: 0.46 (0.42),   PT: mono  DP: mono  TBI:  0.46  L:   ABI: 1.03 (0.72),   PT: mono  DP: bi  TBI: 0.73  Bypass Duplex (10/28/2016)  Proximal to bypass: 299 c/s  With-in bypass: 86-102 c/s  Distal to bypass: 157 c/s   Medical Decision Making   Caitlin Vasquez is a 71 y.o. female who presents with: s/p L CFA/PFA to AK pop BPG w/ ips NR GSV, s/p B CIA stenting done for L heel ulcers, small AAA   Based on the patient's vascular studies and examination, I have offered the patient:  BLE ABI, LLE bypass duplex, and aortoiliac/AAA duplex in 3 months.  I discussed in depth with the patient the nature of atherosclerosis, and emphasized the importance of maximal medical management including strict control of blood pressure, blood glucose, and lipid levels, obtaining regular exercise, and cessation of smoking.    The patient is aware that without maximal medical management the underlying atherosclerotic disease process will progress, limiting the benefit of any interventions.  I discussed in depth with the patient the need for long term surveillance to improve the primary assisted patency of his bypass.  The patient agrees to cooperate with such. The patient is currently on a statin: Lipitor.  The patient is currently on an anti-platelet: ASA.  Thank you for allowing [place] to participate in this patient's care.   Leonides Sake, MD, FACS [facility]  [place] Office: [phone] Pager: [phone]

## 2016-10-28 ENCOUNTER — Ambulatory Visit (INDEPENDENT_AMBULATORY_CARE_PROVIDER_SITE_OTHER)
Admission: RE | Admit: 2016-10-28 | Discharge: 2016-10-28 | Disposition: A | Discharge status: Home / Self Care | Payer: Medicare Other | Source: Ambulatory Visit | Attending: Vascular Surgery | Admitting: Vascular Surgery

## 2016-10-28 ENCOUNTER — Encounter: Payer: Self-pay | Admitting: Vascular Surgery

## 2016-10-28 ENCOUNTER — Ambulatory Visit (HOSPITAL_COMMUNITY)
Admission: RE | Admit: 2016-10-28 | Discharge: 2016-10-28 | Disposition: A | Discharge status: Home / Self Care | Payer: Medicare Other | Source: Ambulatory Visit | Attending: Vascular Surgery | Admitting: Vascular Surgery

## 2016-10-28 ENCOUNTER — Ambulatory Visit (INDEPENDENT_AMBULATORY_CARE_PROVIDER_SITE_OTHER): Payer: Medicare Other | Admitting: Vascular Surgery

## 2016-10-28 VITALS — BP 146/74 | HR 72 | Resp 20 | Ht 64.0 in | Wt 120.0 lb

## 2016-10-28 DIAGNOSIS — I714 Abdominal aortic aneurysm, without rupture, unspecified: Secondary | ICD-10-CM

## 2016-10-28 DIAGNOSIS — I771 Stricture of artery: Secondary | ICD-10-CM | POA: Insufficient documentation

## 2016-10-28 DIAGNOSIS — I70262 Atherosclerosis of native arteries of extremities with gangrene, left leg: Secondary | ICD-10-CM

## 2016-11-03 NOTE — Addendum Note (Signed)
Addended by: Burton Apley A on: 11/03/2016 10:49 AM   Modules accepted: Orders

## 2017-01-27 ENCOUNTER — Other Ambulatory Visit (HOSPITAL_COMMUNITY): Payer: Medicare Other

## 2017-01-27 ENCOUNTER — Encounter (HOSPITAL_COMMUNITY): Payer: Medicare Other

## 2017-02-03 ENCOUNTER — Ambulatory Visit: Payer: Medicare Other | Admitting: Vascular Surgery

## 2019-08-10 ENCOUNTER — Inpatient Hospital Stay (HOSPITAL_COMMUNITY)
Admission: EM | Admit: 2019-08-10 | Discharge: 2019-08-13 | DRG: 064 | Disposition: A | Discharge status: Rehabilitation Facility | Payer: Medicare Other | Source: Other Acute Inpatient Hospital | Attending: Neurology | Admitting: Neurology

## 2019-08-10 ENCOUNTER — Inpatient Hospital Stay (HOSPITAL_COMMUNITY): Payer: Medicare Other

## 2019-08-10 ENCOUNTER — Inpatient Hospital Stay
Admission: EM | Admit: 2019-08-10 | Payer: Medicare Other | Source: Other Acute Inpatient Hospital | Admitting: Neurological Surgery

## 2019-08-10 ENCOUNTER — Emergency Department (HOSPITAL_COMMUNITY): Payer: Medicare Other

## 2019-08-10 ENCOUNTER — Other Ambulatory Visit: Payer: Self-pay

## 2019-08-10 ENCOUNTER — Encounter (HOSPITAL_COMMUNITY): Payer: Self-pay | Admitting: Emergency Medicine

## 2019-08-10 DIAGNOSIS — R0902 Hypoxemia: Secondary | ICD-10-CM | POA: Diagnosis not present

## 2019-08-10 DIAGNOSIS — I63431 Cerebral infarction due to embolism of right posterior cerebral artery: Principal | ICD-10-CM | POA: Diagnosis present

## 2019-08-10 DIAGNOSIS — I10 Essential (primary) hypertension: Secondary | ICD-10-CM | POA: Diagnosis present

## 2019-08-10 DIAGNOSIS — R519 Headache, unspecified: Secondary | ICD-10-CM | POA: Diagnosis present

## 2019-08-10 DIAGNOSIS — E7800 Pure hypercholesterolemia, unspecified: Secondary | ICD-10-CM | POA: Diagnosis present

## 2019-08-10 DIAGNOSIS — R4182 Altered mental status, unspecified: Secondary | ICD-10-CM | POA: Diagnosis not present

## 2019-08-10 DIAGNOSIS — R799 Abnormal finding of blood chemistry, unspecified: Secondary | ICD-10-CM | POA: Diagnosis not present

## 2019-08-10 DIAGNOSIS — I480 Paroxysmal atrial fibrillation: Secondary | ICD-10-CM | POA: Diagnosis present

## 2019-08-10 DIAGNOSIS — I614 Nontraumatic intracerebral hemorrhage in cerebellum: Secondary | ICD-10-CM

## 2019-08-10 DIAGNOSIS — I700 Atherosclerosis of aorta: Secondary | ICD-10-CM | POA: Diagnosis present

## 2019-08-10 DIAGNOSIS — R0989 Other specified symptoms and signs involving the circulatory and respiratory systems: Secondary | ICD-10-CM | POA: Diagnosis not present

## 2019-08-10 DIAGNOSIS — Z9114 Patient's other noncompliance with medication regimen: Secondary | ICD-10-CM | POA: Diagnosis not present

## 2019-08-10 DIAGNOSIS — I615 Nontraumatic intracerebral hemorrhage, intraventricular: Secondary | ICD-10-CM | POA: Diagnosis present

## 2019-08-10 DIAGNOSIS — Z888 Allergy status to other drugs, medicaments and biological substances status: Secondary | ICD-10-CM | POA: Diagnosis not present

## 2019-08-10 DIAGNOSIS — Z9981 Dependence on supplemental oxygen: Secondary | ICD-10-CM | POA: Diagnosis not present

## 2019-08-10 DIAGNOSIS — R159 Full incontinence of feces: Secondary | ICD-10-CM | POA: Diagnosis not present

## 2019-08-10 DIAGNOSIS — I6340 Cerebral infarction due to embolism of unspecified cerebral artery: Secondary | ICD-10-CM | POA: Diagnosis present

## 2019-08-10 DIAGNOSIS — N1831 Chronic kidney disease, stage 3a: Secondary | ICD-10-CM | POA: Diagnosis present

## 2019-08-10 DIAGNOSIS — R54 Age-related physical debility: Secondary | ICD-10-CM | POA: Diagnosis present

## 2019-08-10 DIAGNOSIS — I5021 Acute systolic (congestive) heart failure: Secondary | ICD-10-CM | POA: Diagnosis not present

## 2019-08-10 DIAGNOSIS — R0609 Other forms of dyspnea: Secondary | ICD-10-CM | POA: Diagnosis present

## 2019-08-10 DIAGNOSIS — R1310 Dysphagia, unspecified: Secondary | ICD-10-CM | POA: Diagnosis present

## 2019-08-10 DIAGNOSIS — I161 Hypertensive emergency: Secondary | ICD-10-CM | POA: Diagnosis present

## 2019-08-10 DIAGNOSIS — R339 Retention of urine, unspecified: Secondary | ICD-10-CM | POA: Diagnosis present

## 2019-08-10 DIAGNOSIS — Z96642 Presence of left artificial hip joint: Secondary | ICD-10-CM | POA: Diagnosis present

## 2019-08-10 DIAGNOSIS — I63433 Cerebral infarction due to embolism of bilateral posterior cerebral arteries: Secondary | ICD-10-CM

## 2019-08-10 DIAGNOSIS — Z9119 Patient's noncompliance with other medical treatment and regimen: Secondary | ICD-10-CM

## 2019-08-10 DIAGNOSIS — I69198 Other sequelae of nontraumatic intracerebral hemorrhage: Secondary | ICD-10-CM | POA: Diagnosis present

## 2019-08-10 DIAGNOSIS — I110 Hypertensive heart disease with heart failure: Secondary | ICD-10-CM | POA: Diagnosis not present

## 2019-08-10 DIAGNOSIS — R7309 Other abnormal glucose: Secondary | ICD-10-CM | POA: Diagnosis not present

## 2019-08-10 DIAGNOSIS — Z20822 Contact with and (suspected) exposure to covid-19: Secondary | ICD-10-CM | POA: Diagnosis present

## 2019-08-10 DIAGNOSIS — J449 Chronic obstructive pulmonary disease, unspecified: Secondary | ICD-10-CM | POA: Diagnosis present

## 2019-08-10 DIAGNOSIS — R739 Hyperglycemia, unspecified: Secondary | ICD-10-CM | POA: Diagnosis present

## 2019-08-10 DIAGNOSIS — Z79899 Other long term (current) drug therapy: Secondary | ICD-10-CM

## 2019-08-10 DIAGNOSIS — R0600 Dyspnea, unspecified: Secondary | ICD-10-CM | POA: Diagnosis not present

## 2019-08-10 DIAGNOSIS — I129 Hypertensive chronic kidney disease with stage 1 through stage 4 chronic kidney disease, or unspecified chronic kidney disease: Secondary | ICD-10-CM | POA: Diagnosis present

## 2019-08-10 DIAGNOSIS — I169 Hypertensive crisis, unspecified: Secondary | ICD-10-CM | POA: Diagnosis not present

## 2019-08-10 DIAGNOSIS — N390 Urinary tract infection, site not specified: Secondary | ICD-10-CM | POA: Diagnosis present

## 2019-08-10 DIAGNOSIS — F419 Anxiety disorder, unspecified: Secondary | ICD-10-CM | POA: Diagnosis present

## 2019-08-10 DIAGNOSIS — I6310 Cerebral infarction due to embolism of unspecified precerebral artery: Secondary | ICD-10-CM | POA: Diagnosis not present

## 2019-08-10 DIAGNOSIS — Z23 Encounter for immunization: Secondary | ICD-10-CM | POA: Diagnosis not present

## 2019-08-10 DIAGNOSIS — R32 Unspecified urinary incontinence: Secondary | ICD-10-CM | POA: Diagnosis not present

## 2019-08-10 DIAGNOSIS — I63331 Cerebral infarction due to thrombosis of right posterior cerebral artery: Secondary | ICD-10-CM | POA: Diagnosis not present

## 2019-08-10 DIAGNOSIS — Z87891 Personal history of nicotine dependence: Secondary | ICD-10-CM

## 2019-08-10 DIAGNOSIS — Z7982 Long term (current) use of aspirin: Secondary | ICD-10-CM | POA: Diagnosis not present

## 2019-08-10 DIAGNOSIS — E785 Hyperlipidemia, unspecified: Secondary | ICD-10-CM | POA: Diagnosis present

## 2019-08-10 DIAGNOSIS — B9620 Unspecified Escherichia coli [E. coli] as the cause of diseases classified elsewhere: Secondary | ICD-10-CM | POA: Diagnosis present

## 2019-08-10 DIAGNOSIS — Z8249 Family history of ischemic heart disease and other diseases of the circulatory system: Secondary | ICD-10-CM

## 2019-08-10 DIAGNOSIS — R270 Ataxia, unspecified: Secondary | ICD-10-CM | POA: Diagnosis present

## 2019-08-10 DIAGNOSIS — D62 Acute posthemorrhagic anemia: Secondary | ICD-10-CM | POA: Diagnosis present

## 2019-08-10 DIAGNOSIS — R4781 Slurred speech: Secondary | ICD-10-CM | POA: Diagnosis not present

## 2019-08-10 DIAGNOSIS — G8194 Hemiplegia, unspecified affecting left nondominant side: Secondary | ICD-10-CM | POA: Diagnosis present

## 2019-08-10 DIAGNOSIS — I671 Cerebral aneurysm, nonruptured: Secondary | ICD-10-CM | POA: Diagnosis present

## 2019-08-10 DIAGNOSIS — E876 Hypokalemia: Secondary | ICD-10-CM | POA: Diagnosis present

## 2019-08-10 DIAGNOSIS — I609 Nontraumatic subarachnoid hemorrhage, unspecified: Secondary | ICD-10-CM | POA: Diagnosis present

## 2019-08-10 DIAGNOSIS — L89156 Pressure-induced deep tissue damage of sacral region: Secondary | ICD-10-CM | POA: Diagnosis present

## 2019-08-10 DIAGNOSIS — I4891 Unspecified atrial fibrillation: Secondary | ICD-10-CM | POA: Diagnosis present

## 2019-08-10 DIAGNOSIS — Z7902 Long term (current) use of antithrombotics/antiplatelets: Secondary | ICD-10-CM

## 2019-08-10 DIAGNOSIS — R0682 Tachypnea, not elsewhere classified: Secondary | ICD-10-CM | POA: Diagnosis not present

## 2019-08-10 LAB — LIPID PANEL
Cholesterol: 260 mg/dL — ABNORMAL HIGH (ref 0–200)
HDL: 49 mg/dL (ref >40)
LDL Cholesterol: 168 mg/dL — ABNORMAL HIGH (ref 0–99)
Total CHOL/HDL Ratio: 5.3 RATIO
Triglycerides: 214 mg/dL — ABNORMAL HIGH (ref <150)
VLDL: 43 mg/dL — ABNORMAL HIGH (ref 0–40)

## 2019-08-10 LAB — URINALYSIS, ROUTINE W REFLEX MICROSCOPIC
Bilirubin Urine: NEGATIVE
Glucose, UA: NEGATIVE mg/dL
Ketones, ur: NEGATIVE mg/dL
Leukocytes,Ua: NEGATIVE
Nitrite: POSITIVE — AB
Protein, ur: >300 mg/dL — AB
Specific Gravity, Urine: 1.02 (ref 1.005–1.030)
pH: 7 (ref 5.0–8.0)

## 2019-08-10 LAB — PROTIME-INR
INR: 1 (ref 0.8–1.2)
Prothrombin Time: 12.8 seconds (ref 11.4–15.2)

## 2019-08-10 LAB — CBC
HCT: 42.7 % (ref 36.0–46.0)
Hemoglobin: 14 g/dL (ref 12.0–15.0)
MCH: 28.7 pg (ref 26.0–34.0)
MCHC: 32.8 g/dL (ref 30.0–36.0)
MCV: 87.5 fL (ref 80.0–100.0)
Platelets: 302 10*3/uL (ref 150–400)
RBC: 4.88 MIL/uL (ref 3.87–5.11)
RDW: 13.1 % (ref 11.5–15.5)
WBC: 18.2 10*3/uL — ABNORMAL HIGH (ref 4.0–10.5)
nRBC: 0 % (ref 0.0–0.2)

## 2019-08-10 LAB — COMPREHENSIVE METABOLIC PANEL
ALT: 11 U/L (ref 0–44)
AST: 23 U/L (ref 15–41)
Albumin: 3.3 g/dL — ABNORMAL LOW (ref 3.5–5.0)
Alkaline Phosphatase: 84 U/L (ref 38–126)
Anion gap: 15 (ref 5–15)
BUN: 13 mg/dL (ref 8–23)
CO2: 22 mmol/L (ref 22–32)
Calcium: 8.5 mg/dL — ABNORMAL LOW (ref 8.9–10.3)
Chloride: 103 mmol/L (ref 98–111)
Creatinine, Ser: 1.08 mg/dL — ABNORMAL HIGH (ref 0.44–1.00)
GFR calc Af Amer: 58 mL/min — ABNORMAL LOW (ref >60)
GFR calc non Af Amer: 50 mL/min — ABNORMAL LOW (ref >60)
Glucose, Bld: 175 mg/dL — ABNORMAL HIGH (ref 70–99)
Potassium: 3.3 mmol/L — ABNORMAL LOW (ref 3.5–5.1)
Sodium: 140 mmol/L (ref 135–145)
Total Bilirubin: 0.6 mg/dL (ref 0.3–1.2)
Total Protein: 7.2 g/dL (ref 6.5–8.1)

## 2019-08-10 LAB — TYPE AND SCREEN
ABO/RH(D): O NEG
Antibody Screen: NEGATIVE

## 2019-08-10 LAB — URINALYSIS, MICROSCOPIC (REFLEX)

## 2019-08-10 LAB — RAPID URINE DRUG SCREEN, HOSP PERFORMED
Amphetamines: NOT DETECTED
Barbiturates: NOT DETECTED
Benzodiazepines: NOT DETECTED
Cocaine: NOT DETECTED
Opiates: POSITIVE — AB
Tetrahydrocannabinol: NOT DETECTED

## 2019-08-10 LAB — HEMOGLOBIN A1C
Hgb A1c MFr Bld: 5.6 % (ref 4.8–5.6)
Mean Plasma Glucose: 114.02 mg/dL

## 2019-08-10 MED ORDER — CLEVIDIPINE BUTYRATE 0.5 MG/ML IV EMUL
0.0000 mg/h | INTRAVENOUS | Status: DC
  Administered 2019-08-10: 3 mg/h via INTRAVENOUS
  Administered 2019-08-11: 13 mg/h via INTRAVENOUS
  Administered 2019-08-11: 10 mg/h via INTRAVENOUS
  Administered 2019-08-11: 16 mg/h via INTRAVENOUS
  Administered 2019-08-11 (×2): 20 mg/h via INTRAVENOUS
  Administered 2019-08-11: 12 mg/h via INTRAVENOUS
  Administered 2019-08-11 (×2): 18 mg/h via INTRAVENOUS
  Administered 2019-08-12: 10 mg/h via INTRAVENOUS
  Filled 2019-08-10: qty 50
  Filled 2019-08-10: qty 150
  Filled 2019-08-10 (×9): qty 50

## 2019-08-10 MED ORDER — CHLORHEXIDINE GLUCONATE CLOTH 2 % EX PADS
6.0000 | MEDICATED_PAD | Freq: Every day | CUTANEOUS | Status: DC
  Administered 2019-08-11 – 2019-08-13 (×3): 6 via TOPICAL

## 2019-08-10 MED ORDER — ACETAMINOPHEN 160 MG/5ML PO SOLN: 650.0000 mg | ORAL | Status: DC | PRN

## 2019-08-10 MED ORDER — LORAZEPAM 2 MG/ML IJ SOLN: 1.0000 mg | Freq: Once | INTRAMUSCULAR | Status: AC

## 2019-08-10 MED ORDER — CHLORHEXIDINE GLUCONATE 0.12 % MT SOLN
15.0000 mL | Freq: Two times a day (BID) | OROMUCOSAL | Status: DC
  Administered 2019-08-11 – 2019-08-13 (×5): 15 mL via OROMUCOSAL
  Filled 2019-08-10 (×4): qty 15

## 2019-08-10 MED ORDER — LABETALOL HCL 5 MG/ML IV SOLN: 20.0000 mg | Freq: Once | INTRAVENOUS | Status: DC

## 2019-08-10 MED ORDER — CLEVIDIPINE BUTYRATE 0.5 MG/ML IV EMUL
0.0000 mg/h | INTRAVENOUS | Status: DC
  Administered 2019-08-10: 2 mg/h via INTRAVENOUS
  Filled 2019-08-10: qty 50

## 2019-08-10 MED ORDER — SENNOSIDES-DOCUSATE SODIUM 8.6-50 MG PO TABS
1.0000 | ORAL_TABLET | Freq: Two times a day (BID) | ORAL | Status: DC
  Administered 2019-08-11 – 2019-08-13 (×5): 1 via ORAL
  Filled 2019-08-10 (×5): qty 1

## 2019-08-10 MED ORDER — ACETAMINOPHEN 325 MG PO TABS
650.0000 mg | ORAL_TABLET | ORAL | Status: DC | PRN
  Administered 2019-08-11 – 2019-08-13 (×7): 650 mg via ORAL
  Filled 2019-08-10 (×7): qty 2

## 2019-08-10 MED ORDER — GADOBUTROL 1 MMOL/ML IV SOLN
5.5000 mL | Freq: Once | INTRAVENOUS | Status: AC | PRN
  Administered 2019-08-10: 5.5 mL via INTRAVENOUS

## 2019-08-10 MED ORDER — PANTOPRAZOLE SODIUM 40 MG IV SOLR: 40.0000 mg | Freq: Every day | INTRAVENOUS | Status: DC

## 2019-08-10 MED ORDER — ACETAMINOPHEN 650 MG RE SUPP: 650.0000 mg | RECTAL | Status: DC | PRN

## 2019-08-10 MED ORDER — STROKE: EARLY STAGES OF RECOVERY BOOK: Freq: Once | Status: AC

## 2019-08-10 MED ORDER — FENTANYL CITRATE (PF) 100 MCG/2ML IJ SOLN
40.0000 ug | Freq: Once | INTRAMUSCULAR | Status: AC
  Administered 2019-08-10: 40 ug via INTRAVENOUS
  Filled 2019-08-10: qty 2

## 2019-08-10 MED ORDER — LORAZEPAM 2 MG/ML IJ SOLN
INTRAMUSCULAR | Status: AC
  Filled 2019-08-10: qty 1

## 2019-08-10 NOTE — ED Provider Notes (Signed)
[facility] EMERGENCY DEPARTMENT Provider Note   CSN: [id number] Arrival date & time: 08/10/19  1752     History Chief Complaint  Patient presents with  . Head Bleed    Caitlin Vasquez is a 74 y.o. female.  Patient is a 74 year old female the presents today for concern of weakness in her left arm and hand.  She states that this morning she noted her left arm was weak and that she could not hold her cup of juice.  She states at baseline she does have some weakness in her left side but that it was significantly worse this morning.  She denies any slurring of speech, facial droop, weakness in the legs this morning.  She states that sometime after this occurred her symptoms resolved back to baseline.  She states that she has a bunch of medications but she does not take them maybe once per week.  She thinks she had a stent in the past but has never had a heart attack or stroke.  Per patient's son, Ivin Booty who lives with the patient he noted the patient had some confusion this morning such as not being sure how to open the bathroom door, and not knowing where her cane was even though it was right beside her.  He states that this is not like her and he also noted she had some weakness in her left arm.  He states that she normally does not walk well at baseline but he thinks this morning she was walking a little bit more unsteadily than usual.  Patient's son states that the symptoms lasted about 15 minutes and then improved to approximately her baseline.  Patient son states that EMS took the patient to [facility] and [facility] informed him that she had had a stroke, and was being transferred to [facility].       Past Medical History:  Diagnosis Date  . COPD (chronic obstructive pulmonary disease) (HCC)   . High cholesterol   . On home oxygen therapy    "2L; all the time" (04/06/2016)  . Pneumonia    "when I was a kid"   . Urinary hesitancy     Patient Active  Problem List   Diagnosis Date Noted  . ICH (intracerebral hemorrhage) (HCC) 08/10/2019  . Atherosclerosis of native artery of left lower extremity with gangrene (HCC) 05/20/2016  . Malnutrition of moderate degree 04/11/2016  . Acute left systolic heart failure (HCC) 04/06/2016  . Exertional dyspnea 11/28/2014  . COPD with chronic bronchitis and emphysema (HCC) 11/28/2014  . Chest pain at rest 11/28/2014  . Essential hypertension 11/28/2014    Past Surgical History:  Procedure Laterality Date  . ABDOMINAL AORTOGRAM N/A 04/08/2016   Procedure: Abdominal Aortogram;  Surgeon: Sherren Kerns, MD;  Location: [facility] INVASIVE CV LAB;  Service: Cardiovascular;  Laterality: N/A;  . CARDIAC CATHETERIZATION N/A 11/28/2014   Procedure: Right/Left Heart Cath and Coronary Angiography;  Surgeon: [name], MD;  Location: [facility] INVASIVE CV LAB;  Service: Cardiovascular;  Laterality: N/A;  . CARDIAC CATHETERIZATION  08/2006   Hattie Perch 08/25/2006  . DRESSING CHANGE UNDER ANESTHESIA Left 04/12/2016   Procedure: DRESSING CHANGE LEFT HIP AND LEFT HEEL;  Surgeon: Fransisco Hertz, MD;  Location: [facility] OR;  Service: Vascular;  Laterality: Left;  . FEMORAL-POPLITEAL BYPASS GRAFT Left 04/12/2016   Procedure: LEFT FEMORAL-POPITEAL  BYPASS GRAFT;  Surgeon: Fransisco Hertz, MD;  Location: [facility] OR;  Service: Vascular;  Laterality: Left;  . JOINT  REPLACEMENT    . LOWER EXTREMITY ANGIOGRAPHY Bilateral 04/08/2016   Procedure: Lower Extremity Angiography;  Surgeon: Sherren Kernsharles E Fields, MD;  [facility];  Service: Cardiovascular;  Laterality: Bilateral;  . PERIPHERAL VASCULAR INTERVENTION Bilateral 04/08/2016   Procedure: Peripheral Vascular Intervention;  Surgeon: Sherren Kernsharles E Fields, MD;  [facility];  Service: Cardiovascular;  Laterality: Bilateral;  common iliac  . TOTAL HIP ARTHROPLASTY Left 01/29/2016  . VEIN HARVEST Left 04/12/2016   Procedure: LEFT GREATER SAPHENOUS VEIN HARVEST;  Surgeon: Fransisco HertzBrian L Chen, MD;   [facility];  Service: Vascular;  Laterality: Left;     OB History   No obstetric history on file.     Family History  Problem Relation Age of Onset  . Heart attack Father     Social History   Tobacco Use  . Smoking status: Former Smoker    Packs/day: 1.00    Years: 55.00    Pack years: 55.00    Types: Cigarettes    Quit date: 2016    Years since quitting: 5.5  . Smokeless tobacco: Never Used  Substance Use Topics  . Alcohol use: No  . Drug use: No    Home Medications Prior to Admission medications   Medication Sig Start Date End Date Taking? Authorizing Provider  albuterol (PROVENTIL HFA;VENTOLIN HFA) 108 (90 BASE) MCG/ACT inhaler Inhale 2 puffs into the lungs every 6 (six) hours as needed for wheezing or shortness of breath.    [provider]  ALPRAZolam Prudy Feeler(XANAX) 0.25 MG tablet Take 0.25 mg by mouth at bedtime as needed for anxiety.    [provider]  amLODipine (NORVASC) 10 MG tablet Take 10 mg by mouth daily.    [provider]  aspirin EC 81 MG EC tablet Take 1 tablet (81 mg total) by mouth daily. 04/15/16   Orpah CobbKadakia, Ajay, MD  atorvastatin (LIPITOR) 20 MG tablet Take 1 tablet (20 mg total) by mouth 3 (three) times a week. 04/15/16   Orpah CobbKadakia, Ajay, MD  CALCIUM PO Take 1 tablet by mouth daily.    [provider]  carvedilol (COREG) 12.5 MG tablet Take 1 tablet (12.5 mg total) by mouth 2 (two) times daily with a meal. 04/15/16   Orpah CobbKadakia, Ajay, MD  clopidogrel (PLAVIX) 75 MG tablet Take 1 tablet (75 mg total) by mouth daily. 04/15/16   Orpah CobbKadakia, Ajay, MD  collagenase (SANTYL) ointment Apply topically daily. Apply to left heel daily and cover with dry gauze. Use gauze wrap on top to keep in place. 04/15/16   Orpah CobbKadakia, Ajay, MD  docusate sodium (COLACE) 100 MG capsule Take 1 capsule (100 mg total) by mouth daily. 04/15/16   Orpah CobbKadakia, Ajay, MD  feeding supplement, ENSURE ENLIVE, (ENSURE ENLIVE) LIQD Take 237 mLs by mouth 2 (two) times daily between  meals. 04/15/16   Orpah CobbKadakia, Ajay, MD  ferrous sulfate 325 (65 FE) MG tablet Take 1 tablet (325 mg total) by mouth 2 (two) times daily with a meal. 04/15/16   Orpah CobbKadakia, Ajay, MD  ipratropium (ATROVENT) 0.02 % nebulizer solution Take 0.5 mg by nebulization every 6 (six) hours as needed for shortness of breath.     [provider]  losartan (COZAAR) 100 MG tablet Take 100 mg by mouth daily.    [provider]  mupirocin ointment (BACTROBAN) 2 % Place 1 application into the nose 2 (two) times daily. 04/15/16   Orpah CobbKadakia, Ajay, MD  pantoprazole (PROTONIX) 40 MG tablet Take 1 tablet (40 mg  total) by mouth daily. 04/15/16   [name], MD  potassium chloride SA (K-DUR,KLOR-CON) 10 MEQ tablet Take 1 tablet (10 mEq total) by mouth daily. 04/15/16   [name], MD  spironolactone (ALDACTONE) 25 MG tablet Take 12.5 mg by mouth daily.    [provider]  Tiotropium Bromide-Olodaterol (STIOLTO RESPIMAT) 2.5-2.5 MCG/ACT AERS Inhale 2 puffs into the lungs daily.     [provider]  Vitamin D, Ergocalciferol, (DRISDOL) 50000 units CAPS capsule Take 50,000 Units by mouth every 7 (seven) days.    [provider]    Allergies    Clonidine derivatives  Review of Systems   Review of Systems  Constitutional: Negative for fever.  HENT: Negative for sore throat.   Eyes: Negative for visual disturbance.  Respiratory: Positive for shortness of breath (no change from her baseline).   Cardiovascular: Negative for chest pain.  Gastrointestinal: Negative for abdominal pain.  Genitourinary: Negative for dysuria.  Neurological: Positive for weakness (left arm and hand). Negative for dizziness, facial asymmetry, speech difficulty, numbness and headaches.    Physical Exam Updated Vital Signs BP (!) 158/74   Pulse (!) 118   Temp 98.1 F (36.7 C) (Oral)   Resp (!) 27   SpO2 94%   Physical Exam Constitutional:      Appearance: Normal appearance.  HENT:     Head: Normocephalic  and atraumatic.     Mouth/Throat:     Mouth: Mucous membranes are dry.  Eyes:     Extraocular Movements: Extraocular movements intact.     Pupils: Pupils are equal, round, and reactive to light.  Cardiovascular:     Heart sounds: No murmur heard.   Pulmonary:     Effort: Pulmonary effort is normal.     Breath sounds: Normal breath sounds.  Abdominal:     General: Abdomen is flat. There is no distension.     Palpations: Abdomen is soft.     Tenderness: There is no abdominal tenderness.  Musculoskeletal:        General: Normal range of motion.  Skin:    General: Skin is warm and dry.  Neurological:     General: No focal deficit present.     Mental Status: She is alert.     Cranial Nerves: No cranial nerve deficit.     Sensory: No sensory deficit.     Motor: Weakness (Left arm 4/5 strength vs 5/5 in right, left grip 4/5 strength vs 5/5 in right. Left hip flexor 4/5 strength vs 5/5 in right) present.     ED Results / Procedures / Treatments   Labs (all labs ordered are listed, but only abnormal results are displayed) Labs Reviewed  CBC - Abnormal; Notable for the following components:      Result Value   WBC 18.2 (*)    All other components within normal limits  SARS CORONAVIRUS 2 BY RT PCR (HOSPITAL ORDER, PERFORMED IN [facility])  PROTIME-INR  COMPREHENSIVE METABOLIC PANEL  HEMOGLOBIN A1C  LIPID PANEL  TYPE AND SCREEN    EKG EKG Interpretation  Date/Time:  Saturday August 10 2019 18:08:47 EDT Ventricular Rate:  114 PR Interval:    QRS Duration: 81 QT Interval:  323 QTC Calculation: 445 R Axis:   52 Text Interpretation: Sinus tachycardia Atrial premature complex Right atrial enlargement Probable LVH with secondary repol abnrm Confirmed by Blane Ohara [id number]) on 08/10/2019 7:55:48 PM   Radiology CT Head Wo Contrast  Result Date: 08/10/2019 CLINICAL DATA:  Ataxia, stroke suspected EXAM: CT HEAD WITHOUT CONTRAST TECHNIQUE: Contiguous axial images  were obtained from the base of the skull through the vertex without intravenous contrast. COMPARISON:  CT and CT angiography 08/10/2019 FINDINGS: Brain: Redemonstration of the parenchymal hemorrhage within the paramedian inferior left cerebellar hemisphere with associated adjacent subarachnoid hemorrhage into the fourth ventricle and extending laterally into the basal cisterns. Overall extent of this hemorrhage is not significantly changed from the comparison CT. No new areas of hyperdense hemorrhage are seen. No significant resulting mass effect. No CT evidence of a large acute vascular territory or cortically based infarct. Several stable hypoattenuating foci in the bilateral basal ganglia likely reflect sequela of remote lacunar infarcts. Benign senescent mineralization of the basal ganglia, similar to prior. Symmetric prominence of the ventricles, cisterns and sulci compatible with parenchymal volume loss. Patchy areas of white matter hypoattenuation are most compatible with chronic microvascular angiopathy. Vascular: Extensive calcification within the carotid siphons and right intradural vertebral artery. Skull: No calvarial fracture or suspicious osseous lesion. No scalp swelling or hematoma. Sinuses/Orbits: Paranasal sinuses and mastoid air cells are predominantly clear. Included orbital structures are unremarkable. Other: Absence of the maxillary dentition with the mandibular bridge. IMPRESSION: 1. Redemonstrated parenchymal hemorrhage within the paramedian inferior left cerebellum with associated subarachnoid hemorrhage slightly redistributed from prior without significant interval increase in the size of the hemorrhage or new areas of hemorrhage identified. 2. Stable parenchymal volume loss and chronic microvascular angiopathy. 3. Remote lacunar infarcts in the bilateral basal ganglia. Electronically Signed   By: Kreg Shropshire M.D.   On: 08/10/2019 20:08    Procedures Procedures (including critical care  time)  Medications Ordered in ED Medications   stroke: mapping our early stages of recovery book (has no administration in time range)  acetaminophen (TYLENOL) tablet 650 mg (has no administration in time range)    Or  acetaminophen (TYLENOL) 160 MG/5ML solution 650 mg (has no administration in time range)    Or  acetaminophen (TYLENOL) suppository 650 mg (has no administration in time range)  senna-docusate (Senokot-S) tablet 1 tablet (has no administration in time range)  pantoprazole (PROTONIX) injection 40 mg (has no administration in time range)  labetalol (NORMODYNE) injection 20 mg (has no administration in time range)    And  clevidipine (CLEVIPREX) infusion 0.5 mg/mL (3 mg/hr Intravenous New Bag/Given 08/10/19 2103)  fentaNYL (SUBLIMAZE) injection 40 mcg (40 mcg Intravenous Given 08/10/19 2059)    ED Course  I have reviewed the triage vital signs and the nursing notes.  Pertinent labs & imaging results that were available during my care of the patient were reviewed by me and considered in my medical decision making (see chart for details). Patient with history concerning of left arm and leg weakness which per patient and patient's son this resolved after about 15 minutes, however patient does still have 4/5 strength in left arm/grip/left leg.  Per chart sent with the patient significant for head CT showing acute left cerebellar hemorrhage when compared to previous CT.  Head CT shows performed in the emergency department significant for redemonstrated parenchymal hemorrhage within the paramedian inferior left cerebellum with associated subarachnoid hemorrhage slightly redistributed from prior without significant interval increase.  Neurosurgery was consulted in the emergency department and stated they would consider the patient, the requested also consult neurology.  Neurology was consulted.  Patient was provided 40 mcg fentanyl IV for pain control.  Patient was admitted to the neuro ICU.    MDM Rules/Calculators/A&P  Patient transferred from [facility] with CT evidence of left cerebellar bleed with presence of subarachnoid hemorrhage.  Repeat CT here shows slight redistribution of the subarachnoid hemorrhage from prior image without significant interval increase.  Patient with left-sided upper and lower extremity weakness present on physical exam.  Neurology and neurosurgery was consulted emergency department due to patient CT results.  Reviewed patient records sent with patient. Final Clinical Impression(s) / ED Diagnoses Final diagnoses:  Cerebellar bleed [facility])    Rx / DC Orders ED Discharge Orders    None       Jackelyn Poling, DO 08/10/19 2109    Blane Ohara, MD 08/12/19 [phone]

## 2019-08-10 NOTE — ED Notes (Signed)
Pt's IV potassium has finished infusing.

## 2019-08-10 NOTE — H&P (Signed)
Neurology Consultation  CC: cerebellar bleed  History is obtained from:patient/chart  HPI: Caitlin Vasquez is a 74 y.o. female past medical history of advanced COPD, hypercholesterolemia, atrial fibrillation not on anticoagulation, presents to the emergency room as a transfer from [facility] for evaluation of cerebellar bleed. She presented to the ED renal [facility] with left-sided weakness and numbness this morning.  EMS reported that she had gone to the bathroom in her usual state of health but was having difficulty getting out of the bathroom and family noticed that she was not moving the left side of her body normally.  She seemed to be staring towards the left and was having difficulty walking as well.  She has not been compliant any of her medications.  Patient reported some numbness on the left side.  No slurred speech or facial droop was reported at that time. CT head was performed that showed an acute hemorrhage within the paramedian inferior left cerebellar hemisphere with adjacent small volume acute subarachnoid hemorrhage-likely extension of some subarachnoid bleed into the fourth ventricle.  Further recommendations of follow-up with MRI were provided. Neurosurgery was consulted over the phone and patient was transferred to the [facility], ED. Neurosurgery reviewed patient's case and recommended neurological consultation and admission. Patient is not a very good historian. Very unclear about the last known well-sometime last night probably.   LKW: Unclear tpa given?: no, ICH Premorbid modified Rankin scale (mRS):2-mostly from COPD ICH Score: 2-infratentorial, IVH.   ROS: Obtained and negative except as noted in HPI.  Past Medical History:  Diagnosis Date  . COPD (chronic obstructive pulmonary disease) (HCC)   . High cholesterol   . On home oxygen therapy    "2L; all the time" (04/06/2016)  . Pneumonia    "when I was a kid"   . Urinary hesitancy       Family History  Problem Relation Age of Onset  . Heart attack Father      Social History:   reports that she quit smoking about 5 years ago. Her smoking use included cigarettes. She has a 55.00 pack-year smoking history. She has never used smokeless tobacco. She reports that she does not drink alcohol and does not use drugs.  Medications  Current Facility-Administered Medications:  .   stroke: mapping our early stages of recovery book, , Does not apply, Once, Milon Dikes, MD .  acetaminophen (TYLENOL) tablet 650 mg, 650 mg, Oral, Q4H PRN **OR** acetaminophen (TYLENOL) 160 MG/5ML solution 650 mg, 650 mg, Per Tube, Q4H PRN **OR** acetaminophen (TYLENOL) suppository 650 mg, 650 mg, Rectal, Q4H PRN, Milon Dikes, MD .  labetalol (NORMODYNE) injection 20 mg, 20 mg, Intravenous, Once **AND** clevidipine (CLEVIPREX) infusion 0.5 mg/mL, 0-21 mg/hr, Intravenous, Continuous, Milon Dikes, MD .  pantoprazole (PROTONIX) injection 40 mg, 40 mg, Intravenous, QHS, Milon Dikes, MD .  senna-docusate (Senokot-S) tablet 1 tablet, 1 tablet, Oral, BID, Milon Dikes, MD  Current Outpatient Medications:  .  albuterol (PROVENTIL HFA;VENTOLIN HFA) 108 (90 BASE) MCG/ACT inhaler, Inhale 2 puffs into the lungs every 6 (six) hours as needed for wheezing or shortness of breath., Disp: , Rfl:  .  ALPRAZolam (XANAX) 0.25 MG tablet, Take 0.25 mg by mouth at bedtime as needed for anxiety., Disp: , Rfl:  .  amLODipine (NORVASC) 10 MG tablet, Take 10 mg by mouth daily., Disp: , Rfl:  .  aspirin EC 81 MG EC tablet, Take 1 tablet (81 mg total) by mouth daily., Disp: 30 tablet, Rfl:  3 .  atorvastatin (LIPITOR) 20 MG tablet, Take 1 tablet (20 mg total) by mouth 3 (three) times a week., Disp: 30 tablet, Rfl: 3 .  CALCIUM PO, Take 1 tablet by mouth daily., Disp: , Rfl:  .  carvedilol (COREG) 12.5 MG tablet, Take 1 tablet (12.5 mg total) by mouth 2 (two) times daily with a meal., Disp: 60 tablet, Rfl: 3 .  clopidogrel  (PLAVIX) 75 MG tablet, Take 1 tablet (75 mg total) by mouth daily., Disp: 30 tablet, Rfl: 3 .  collagenase (SANTYL) ointment, Apply topically daily. Apply to left heel daily and cover with dry gauze. Use gauze wrap on top to keep in place., Disp: 15 g, Rfl: 2 .  docusate sodium (COLACE) 100 MG capsule, Take 1 capsule (100 mg total) by mouth daily., Disp: 30 capsule, Rfl: 3 .  feeding supplement, ENSURE ENLIVE, (ENSURE ENLIVE) LIQD, Take 237 mLs by mouth 2 (two) times daily between meals., Disp: 60 Bottle, Rfl: 12 .  ferrous sulfate 325 (65 FE) MG tablet, Take 1 tablet (325 mg total) by mouth 2 (two) times daily with a meal., Disp: 60 tablet, Rfl: 3 .  ipratropium (ATROVENT) 0.02 % nebulizer solution, Take 0.5 mg by nebulization every 6 (six) hours as needed for shortness of breath. , Disp: , Rfl:  .  losartan (COZAAR) 100 MG tablet, Take 100 mg by mouth daily., Disp: , Rfl:  .  mupirocin ointment (BACTROBAN) 2 %, Place 1 application into the nose 2 (two) times daily., Disp: 22 g, Rfl: 0 .  pantoprazole (PROTONIX) 40 MG tablet, Take 1 tablet (40 mg total) by mouth daily., Disp: 30 tablet, Rfl: 3 .  potassium chloride SA (K-DUR,KLOR-CON) 10 MEQ tablet, Take 1 tablet (10 mEq total) by mouth daily., Disp: 30 tablet, Rfl: 3 .  spironolactone (ALDACTONE) 25 MG tablet, Take 12.5 mg by mouth daily., Disp: , Rfl:  .  Tiotropium Bromide-Olodaterol (STIOLTO RESPIMAT) 2.5-2.5 MCG/ACT AERS, Inhale 2 puffs into the lungs daily. , Disp: , Rfl:  .  Vitamin D, Ergocalciferol, (DRISDOL) 50000 units CAPS capsule, Take 50,000 Units by mouth every 7 (seven) days., Disp: , Rfl:    Exam: Current vital signs: BP (!) 142/62   Pulse (!) 114   Temp 98.1 F (36.7 C) (Oral)   Resp (!) 22   SpO2 92%  Vital signs in last 24 hours: Temp:  [98.1 F (36.7 C)] 98.1 F (36.7 C) (07/03 1815) Pulse Rate:  [109-121] 114 (07/03 2055) Resp:  [13-35] 22 (07/03 2055) BP: (115-150)/(60-105) 142/62 (07/03 2055) SpO2:  [91 %-95 %]  92 % (07/03 2055) General: Awake alert in some distress due to headache HEENT: Normocephalic atraumatic Lungs: Wheezing all over Cardiovascular: Regular rate rhythm Abdomen soft nondistended nontender Extremities warm well perfused Neurological exam Awake alert oriented x2. Speech is mildly dysarthric No evidence of aphasia Cranial nerves: She has equal pupils which are round and reactive to light, she has a rightward gaze preference-she is able to move her eyes to the midline and a little past midline but not all the way to the left, she also seems to have a left hemianopsia, face symmetric, tongue and palate midline. Motor exam: No vertical drift in any of the 4 extremities with the left upper and lower extremity are probably 4+/5 when compared to the 5/5 strength of the right side. Sensory exam: There is numbness/diminished sensation reported subjectively on the left side. Coordination: She is ataxic with finger-nose-finger testing on the left. Difficult to perform  heel-knee-shin Gait testing was deferred at this time NIH stroke scale 1a Level of Conscious.: 0 1b LOC Questions: 0 1c LOC Commands: 0 2 Best Gaze: 1 3 Visual: 1 4 Facial Palsy: 0 5a Motor Arm - left: 0 5b Motor Arm - Right: 0 6a Motor Leg - Left: 0 6b Motor Leg - Right: 0 7 Limb Ataxia: 2 8 Sensory: 1 9 Best Language: 0 10 Dysarthria: 1 11 Extinct. and Inatten.: 0 TOTAL: 6  Labs I have reviewed labs in epic and the results pertinent to this consultation are:  CBC    Component Value Date/Time   WBC 18.2 (H) 08/10/2019 2043   RBC 4.88 08/10/2019 2043   HGB 14.0 08/10/2019 2043   HCT 42.7 08/10/2019 2043   PLT 302 08/10/2019 2043   MCV 87.5 08/10/2019 2043   MCH 28.7 08/10/2019 2043   MCHC 32.8 08/10/2019 2043   RDW 13.1 08/10/2019 2043   LYMPHSABS 1.0 04/14/2016 1021   MONOABS 0.7 04/14/2016 1021   EOSABS 0.1 04/14/2016 1021   BASOSABS 0.0 04/14/2016 1021    CMP     Component Value Date/Time    NA 135 04/14/2016 1021   K 3.7 04/14/2016 1021   CL 100 (L) 04/14/2016 1021   CO2 26 04/14/2016 1021   GLUCOSE 127 (H) 04/14/2016 1021   BUN 11 04/14/2016 1021   CREATININE 0.68 04/14/2016 1021   CALCIUM 8.3 (L) 04/14/2016 1021   PROT 5.7 (L) 04/12/2016 0227   ALBUMIN 3.0 (L) 04/12/2016 0227   AST 15 04/12/2016 0227   ALT 14 04/12/2016 0227   ALKPHOS 58 04/12/2016 0227   BILITOT 0.6 04/12/2016 0227   GFRNONAA >60 04/14/2016 1021   GFRAA >60 04/14/2016 1021    Imaging I have reviewed the images obtained:  CT-scan of the brain-acute hemorrhage in the left paramedian cerebellum with mild subarachnoid extension and very trace intraventricular extension. Unchanged from the CT scan at [facility]. At [facility]: CTA head and neck with no aneurysm identified. FORMAL IMPRESSION PASTED FROM PACS IMPRESSION: CTA neck:  1. Examination limited by motion artifact as described. 2. The right common and internal carotid arteries are patent. Estimated stenosis of the proximal right ICA of up to 60%, although motion artifact limits evaluation at this site. 3. Apparent high-grade stenoses at the origin and proximal aspect of the left ICA, although blooming artifact from prominent calcifications limits evaluation and carotid artery duplex is recommended for further evaluation. 4. Moderate/severe stenosis at the origin of the dominant right vertebral artery. 5. The left vertebral artery is developmentally diminutive and markedly irregular with multifocal high-grade stenoses throughout the neck. 6. High-grade stenosis of the proximal right subclavian artery distal to the right vertebral artery takeoff. 7. A high-grade stenosis of the proximal left vertebral artery at the level of the left vertebral artery takeoff is questioned.  CTA head:  1. Intracranial atherosclerotic disease with multifocal stenoses, most notably as follows. 2. The non dominant intracranial left vertebral artery is  poorly delineated and likely functionally occluded. However, flow is seen within the proximal left PICA. 3. Moderate/severe stenosis within the V4 right vertebral artery. 4. Severe stenosis within the P1 right posterior cerebral artery. 5. Moderate/severe stenosis of the cavernous right ICA. 6. Moderate stenosis of the cavernous left ICA. 7. High-grade stenosis at the origin of a superior division left M2 MCA branch vessel. 8. Moderate/severe focal stenosis within the proximal A2 right anterior cerebral artery. 9. Multifocal high-grade stenoses within the A3 and more distal right  anterior cerebral artery. 10. 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 11. 2 mm infundibulum versus aneurysm arising from the anterior communicating artery complex. 12. No aneurysm is identified in the region of the left inferior cerebellar parenchymal hematoma.  Assessment:  74 year old with above past medical history presenting for evaluation of left-sided numbness weakness and headache noted to have a left cerebellar bleed-differentials include hypertensive bleed versus hemorrhagic transformation of an ischemic stroke given multiple intracranial and cervical atherosclerotic/stenotic changes. Other differentials include a mass-based on the CTA appearance, it is unclear whether there is anything underlying but most of the view is obscured by acute bleeding. MRI would be helpful to get some more information further.   Assessment:   Plan: Subcortical ICH, nontraumatic Question underlying ischemic stroke with current presentation be hemorrhagic transformation  Acuity: Acute Laterality: Left cerebellum Current suspected etiology: Under investigation Treatment:  -Admit to neurological ICU -ICH Score: 2 -BP control goal SYS<140 -neuromonitoring  CNS Cerebral edema Compression of brain -Exam stable for now.  No need for hyperosmolar therapy -NSGY consult already called by  ER-appreciate recommendations -Close neuro monitoring  No evidence of hydrocephalus -Continue to monitor clinically -Repeat CT head in the morning.    Dysarthria Dysphagia following ICH  -NPO until cleared by speech -ST  Toxic encephalopathy -Correct metabolic causes -Monitor  Hemiplegia and hemiparesis following nontraumatic intracerebral hemorrhage affecting left non-dominant side  -Continue PT/OT/ST  RESP COPD-on home oxygen -Continue oxygen -Continue home medications  CV Hypertensive Encephalopathy Essential (primary) hypertension Hypertensive Emergency -Aggressive BP control, goal SBP <140 -Titrate Cleviprex.  Also can use labetalol/hydralazine as needed-orders placed.  Paroxysmal atrial -fibrillation Hold anticoagulation for now.  GI/GU CMP pending   HEME No anemia Has leukocytosis We will check infectious work-up  No evidence of coagulopathy  ENDO No active issues -goal HgbA1c < 7  Fluid/Electrolyte Disorders Check CMP Replete as necessary  ID Possible Aspiration PNA vs COPD exacerbation -CXR -NPO -Monitor -Hold antibiotics for nw  Possible UTI -CX pending  Will order chest x-ray, urinalysis, blood cultures because of leukocytosis   Prophylaxis DVT: SCD GI: PPI Bowel: Docusate senna  Dispo: To be determined  Diet: NPO until cleared by speech/bedside swallow  Code Status: Full Code     THE FOLLOWING WERE PRESENT ON ADMISSION: ICH Hemiparesis/hemiplegia Possible aspiration pneumonia Possible UTI Hypertensive emergency Intraventricular hemorrhage ICH score 2-30-day mortality 26%.   -- Milon Dikes, MD [facility] Pager: [phone] If 7pm to 7am, please call on call as listed on AMION.   CRITICAL CARE ATTESTATION Performed by: Milon Dikes, MD Total critical care time:76minutes Critical care time was exclusive of separately billable procedures and treating other patients and/or supervising  APPs/Residents/Students Critical care was necessary to treat or prevent imminent or life-threatening deterioration due to ICH, HTN emergency This patient is critically ill and at significant risk for neurological worsening and/or death and care requires constant monitoring. Critical care was time spent personally by me on the following activities: development of treatment plan with patient and/or surrogate as well as nursing, discussions with consultants, evaluation of patient's response to treatment, examination of patient, obtaining history from patient or surrogate, ordering and performing treatments and interventions, ordering and review of laboratory studies, ordering and review of radiographic studies, pulse oximetry, re-evaluation of patient's condition, participation in multidisciplinary rounds and medical decision making of high complexity in the care of this patient.

## 2019-08-10 NOTE — ED Notes (Signed)
Nicardipine drip is empty. [place] DO informed. Pt's BP now 150/88

## 2019-08-10 NOTE — ED Triage Notes (Addendum)
Pt here as a transfer from [facility] for head bleed. This morning pt went to the bathroom and couldn't open the door, family noticed L side weakness and didn't look steady with ambulation so she was lowered to the ground. Pt has L side drift, L side ataxia. Pt hx of COPd, CHF, HTN, noncomplaint w/ meds. Pt on 2L O2 via [place] with SpO2 at 94%. Pt c/o back pain and headache. Aox4. Pt receiving IV potassium, normal saline bolus, and on Nicardipine drip at 12.5mg /hr

## 2019-08-10 NOTE — Consult Note (Signed)
Neurosurgery Consultation  Reason for Consult: ICH Referring Physician: Wilford Corner  CC: LUE weakness  HPI: This is a 74 y.o. woman that presents with acute onset left sided weakness and ataxia. No preceding symptoms, no headache, no neck pain / stiffness. +ASA81 & +clopidogrel, otherwise no recent use of anti-platelet or anti-coagulant medications.    ROS: A 14 point ROS was performed and is negative except as noted in the HPI.   PMHx:  Past Medical History:  Diagnosis Date  . COPD (chronic obstructive pulmonary disease) (HCC)   . High cholesterol   . On home oxygen therapy    "2L; all the time" (04/06/2016)  . Pneumonia    "when I was a kid"   . Urinary hesitancy    FamHx:  Family History  Problem Relation Age of Onset  . Heart attack Father    SocHx:  reports that she quit smoking about 5 years ago. Her smoking use included cigarettes. She has a 55.00 pack-year smoking history. She has never used smokeless tobacco. She reports that she does not drink alcohol and does not use drugs.  Exam: Vital signs in last 24 hours: Temp:  [98.1 F (36.7 C)] 98.1 F (36.7 C) (07/03 1815) Pulse Rate:  [107-121] 107 (07/03 2125) Resp:  [13-35] 20 (07/03 2125) BP: (115-158)/(60-105) 145/84 (07/03 2125) SpO2:  [91 %-95 %] 91 % (07/03 2125) General: Awake, alert, cooperative, lying in bed in NAD Head: Normocephalic and atruamatic HEENT: Neck supple, no kernig / brudzinski Pulmonary: breathing supplemental O2 via [place] comfortably, no evidence of increased work of breathing Cardiac: RRR Abdomen: S NT ND Extremities: Warm and well perfused x4 Neuro: AOx3, PERRL, EOMI, FS & symmetrically sensate Strength 5/5 on R, 4-/5 in LUE, 4+/5 in LLE, SILTx4 Severe LUE appendicular ataxia and dysmetria  Assessment and Plan: 74 y.o. woman w/ acute onset LUE weakness and appendicular ataxia. CTH / CTA head personally reviewed, which show L vermian intracerebellar hemorrhage with adjacent SAH, +IVH and some  likely blood tracking out L[place]ch[place] bilaterally, no ventriculomegaly. CTA with left 46mm PCA and possible AComm infundibulum, no blood adjacent to aneurysms.   -no acute neurosurgical intervention indicated at this time, hydrocephalus watch -recommend MRI w/wo when able to evaluate for underlying mass given the less common location of hemorrhage   Jadene Pierini, MD 08/10/19 9:30 PM [facility]

## 2019-08-11 ENCOUNTER — Encounter (HOSPITAL_COMMUNITY): Payer: Self-pay | Admitting: Student in an Organized Health Care Education/Training Program

## 2019-08-11 ENCOUNTER — Inpatient Hospital Stay (HOSPITAL_COMMUNITY): Payer: Medicare Other

## 2019-08-11 LAB — MRSA PCR SCREENING: MRSA by PCR: NEGATIVE

## 2019-08-11 LAB — SARS CORONAVIRUS 2 BY RT PCR (HOSPITAL ORDER, PERFORMED IN ~~LOC~~ HOSPITAL LAB): SARS Coronavirus 2: NEGATIVE

## 2019-08-11 MED ORDER — POTASSIUM CHLORIDE 10 MEQ/100ML IV SOLN
10.0000 meq | INTRAVENOUS | Status: AC
  Administered 2019-08-11 (×3): 10 meq via INTRAVENOUS
  Filled 2019-08-11 (×2): qty 100

## 2019-08-11 MED ORDER — ONDANSETRON HCL 4 MG/2ML IJ SOLN
INTRAMUSCULAR | Status: AC
  Administered 2019-08-11: 4 mg via INTRAVENOUS
  Filled 2019-08-11: qty 2

## 2019-08-11 MED ORDER — IOHEXOL 350 MG/ML SOLN
50.0000 mL | Freq: Once | INTRAVENOUS | Status: AC | PRN
  Administered 2019-08-11: 50 mL via INTRAVENOUS

## 2019-08-11 MED ORDER — TEMAZEPAM 15 MG PO CAPS
15.0000 mg | ORAL_CAPSULE | Freq: Every evening | ORAL | Status: DC | PRN
  Administered 2019-08-11 – 2019-08-12 (×2): 15 mg via ORAL
  Filled 2019-08-11 (×2): qty 1

## 2019-08-11 MED ORDER — PANTOPRAZOLE SODIUM 40 MG PO TBEC
40.0000 mg | DELAYED_RELEASE_TABLET | Freq: Every day | ORAL | Status: DC
  Administered 2019-08-11 – 2019-08-12 (×2): 40 mg via ORAL
  Filled 2019-08-11 (×2): qty 1

## 2019-08-11 MED ORDER — ONDANSETRON HCL 4 MG/2ML IJ SOLN
4.0000 mg | Freq: Four times a day (QID) | INTRAMUSCULAR | Status: DC | PRN
  Administered 2019-08-12 (×2): 4 mg via INTRAVENOUS
  Filled 2019-08-11 (×2): qty 2

## 2019-08-11 NOTE — Progress Notes (Signed)
  Echocardiogram 2D Echocardiogram has been performed.  Gerda Diss 08/11/2019, 5:59 PM

## 2019-08-11 NOTE — Progress Notes (Addendum)
Patient has repeatedly asked if she was hooked up to the purewick today and complained of need to urinate. She has not voided.  Bladder scan indicated of urine. Due to patient's complaints of discomfort, completed in and out cath with of urine voided. Replaced purewick.

## 2019-08-11 NOTE — Evaluation (Signed)
Speech Language Pathology Evaluation Patient Details Name: Caitlin Vasquez MRN: [id number] DOB: January 20, 1946 Today's Date: 08/11/2019 Time: 0730-0750 SLP Time Calculation (min) (ACUTE ONLY): 20 min  Problem List:  Patient Active Problem List   Diagnosis Date Noted  . ICH (intracerebral hemorrhage) (HCC) 08/10/2019  . Atherosclerosis of native artery of left lower extremity with gangrene (HCC) 05/20/2016  . Malnutrition of moderate degree 04/11/2016  . Acute left systolic heart failure (HCC) 04/06/2016  . Exertional dyspnea 11/28/2014  . COPD with chronic bronchitis and emphysema (HCC) 11/28/2014  . Chest pain at rest 11/28/2014  . Essential hypertension 11/28/2014   Past Medical History:  Past Medical History:  Diagnosis Date  . COPD (chronic obstructive pulmonary disease) (HCC)   . High cholesterol   . On home oxygen therapy    "2L; all the time" (04/06/2016)  . Pneumonia    "when I was a kid"   . Urinary hesitancy    Past Surgical History:  Past Surgical History:  Procedure Laterality Date  . ABDOMINAL AORTOGRAM N/A 04/08/2016   Procedure: Abdominal Aortogram;  Surgeon: Sherren Kerns, MD;  Location: [facility] INVASIVE CV LAB;  Service: Cardiovascular;  Laterality: N/A;  . CARDIAC CATHETERIZATION N/A 11/28/2014   Procedure: Right/Left Heart Cath and Coronary Angiography;  Surgeon: [name], MD;  Location: [facility] INVASIVE CV LAB;  Service: Cardiovascular;  Laterality: N/A;  . CARDIAC CATHETERIZATION  08/2006   Hattie Perch 08/25/2006  . DRESSING CHANGE UNDER ANESTHESIA Left 04/12/2016   Procedure: DRESSING CHANGE LEFT HIP AND LEFT HEEL;  Surgeon: Fransisco Hertz, MD;  Location: [facility] OR;  Service: Vascular;  Laterality: Left;  . FEMORAL-POPLITEAL BYPASS GRAFT Left 04/12/2016   Procedure: LEFT FEMORAL-POPITEAL  BYPASS GRAFT;  Surgeon: Fransisco Hertz, MD;  Location: [facility] OR;  Service: Vascular;  Laterality: Left;  . JOINT REPLACEMENT    . LOWER EXTREMITY ANGIOGRAPHY Bilateral 04/08/2016   Procedure: Lower  Extremity Angiography;  Surgeon: Sherren Kerns, MD;  Location: [facility] INVASIVE CV LAB;  Service: Cardiovascular;  Laterality: Bilateral;  . PERIPHERAL VASCULAR INTERVENTION Bilateral 04/08/2016   Procedure: Peripheral Vascular Intervention;  Surgeon: Sherren Kerns, MD;  Location: [facility] INVASIVE CV LAB;  Service: Cardiovascular;  Laterality: Bilateral;  common iliac  . TOTAL HIP ARTHROPLASTY Left 01/29/2016  . VEIN HARVEST Left 04/12/2016   Procedure: LEFT GREATER SAPHENOUS VEIN HARVEST;  Surgeon: Fransisco Hertz, MD;  Location: [facility] OR;  Service: Vascular;  Laterality: Left;   HPI:  Caitlin Vasquez is a 74 y.o. female with a  past medical history significant for advanced COPD, hypercholesterolemia, atrial fibrillation not on anticoagulation.  She presented to the emergency room as a transfer from [facility] for evaluation of cerebellar bleed.  She presented to the ED [facility] with left-sided weakness and numbness this morning.  EMS reported that she had gone to the bathroom in her usual state of health but was having difficulty getting out of the bathroom and her family noticed that she was not moving the left side of her body normally.  She seemed to be staring towards the left and was having difficulty walking as well.  She has not been compliant any of her medications.   No slurred speech or facial droop was reported at that time.  CT head was performed that showed an acute hemorrhage within the paramedian inferior left cerebellar hemisphere with adjacent small volume acute subarachnoid hemorrhage-likely extension of some subarachnoid bleed into the fourth ventricle.  Further recommendations of  follow-up with MRI were suggested, study results are pending.  Neurosurgery was consulted over the phone and patient was transferred to the [facility], ED.  The patient reported that she lives with her son and has limited responsibilities at home.  PTA she was not driving, cooking, cleaning  or managing bills.  She was managing her medications.   Assessment / Plan / Recommendation Clinical Impression  Cognitive/linguistic and motor speech evaluation were completed.  Cranial nerve exam was completed and remarkable for bilateral lingual weakness.  Facial sensation was not assessed.  Speech was clear and easy to understand.  No discernible dysarthria or apraxia was noted.  She achieved an overall score of 18 out of a possible 28 points on the Mini Mental State Exam (ie it is normally scored out of 30 but portions requiring writing/copying were not administered).  She was oriented to person, place and situation.  She was partially oriented to time knowing the month and year.  She was disoriented to the date and day of the week.  She had good immediate recall of 3 novel words, however, given a short delay she required max cues.  She was easily distracted during this exam and unable to complete the attention task.  It was unclear the role pain from her IV site played in her inability to fully attend to task.  Language skills appeared to be grossly functional.  She was able to name objects, repeat a short sentence, follow a 3 step command and read/comprehend a short sentence.  Speech appeared fluent.  She was able to provide logical solutions to simple problems.  She appeared to have a field cut on the left and was unable to see things until almost midline. This lead to issues reading the presented sentence at first.  She started with the last word in the sentence.  Given cues to call her attention to the first word she was able to accurately read the sentence in its entirety.  Given clinical presentation she will require ST follow up at the next level of care and possibly 24x7 supervision/assistance at DC.  ST will follow during acute stay.      SLP Assessment  SLP Recommendation/Assessment: Patient needs continued Speech Lanaguage Pathology Services SLP Visit Diagnosis: Attention and concentration  deficit Attention and concentration deficit following: Nontraumatic intracerebral hemorrhage    Follow Up Recommendations  Other (comment) (continued ST at next level of care)    Frequency and Duration min 2x/week  2 weeks      SLP Evaluation Cognition  Overall Cognitive Status: No family/caregiver present to determine baseline cognitive functioning Arousal/Alertness: Awake/alert Orientation Level: Oriented X4 Attention: Sustained Sustained Attention: Impaired Sustained Attention Impairment: Verbal basic Memory: Impaired Memory Impairment: Decreased recall of new information Awareness: Impaired Awareness Impairment: Intellectual impairment Problem Solving: Appears intact Behaviors: Restless       Comprehension  Auditory Comprehension Overall Auditory Comprehension: Appears within functional limits for tasks assessed Yes/No Questions: Not tested Commands: Within Functional Limits Conversation: Simple Interfering Components: Attention;Visual impairments EffectiveTechniques: Extra processing time;Slowed speech Reading Comprehension Reading Status: Within funtional limits    Expression Expression Primary Mode of Expression: Verbal Verbal Expression Overall Verbal Expression: Appears within functional limits for tasks assessed Initiation: No impairment Automatic Speech: Name;Social Response Level of Generative/Spontaneous Verbalization: Sentence;Phrase Repetition: No impairment Naming: No impairment Pragmatics: No impairment Interfering Components: Attention Non-Verbal Means of Communication: Not applicable Written Expression Dominant Hand: Right Written Expression: Not tested   Oral / Motor  Oral Motor/Sensory Function  Overall Oral Motor/Sensory Function: Within functional limits Motor Speech Overall Motor Speech: Appears within functional limits for tasks assessed Respiration: Within functional limits Phonation: Normal Resonance: Within functional  limits Articulation: Within functional limitis Intelligibility: Intelligible Motor Planning: Witnin functional limits Motor Speech Errors: Not applicable   GO                   Dimas Aguas, MA, CCC-SLP Acute Rehab SLP [phone]  Fleet Contras 08/11/2019, 8:17 AM

## 2019-08-11 NOTE — Progress Notes (Signed)
Inpatient Rehab Admissions Coordinator Note:   Per therapy recommendations, pt was screened for CIR candidacy by Naarah Borgerding, MS CCC-SLP. At this time, Pt. Appears to have functional decline and is a good candidate for CIR. Will request order for rehab consult per protocol.  Please contact me with questions.   Zacherie Honeyman, MS, CCC-SLP Rehab Admissions Coordinator  [phone] (celll) [phone] (office)  

## 2019-08-11 NOTE — Progress Notes (Signed)
Patient requested medication for nausea and something to "help her sleep" "she's really stressed and can't relax" Received verbal order from Dr. Nicholas Lose for 4mg  zofran IV q6PRN and 15mg  temazpam PO PRN at bedtime.

## 2019-08-11 NOTE — Progress Notes (Signed)
STROKE TEAM PROGRESS NOTE   HISTORY OF PRESENT ILLNESS (per record) Caitlin Vasquez is a 74 y.o. female past medical history of advanced COPD, hypercholesterolemia, atrial fibrillation not on anticoagulation, presents to the emergency room as a transfer from [facility] for evaluation of cerebellar bleed. She presented to the ED renal [facility] with left-sided weakness and numbness this morning.  EMS reported that she had gone to the bathroom in her usual state of health but was having difficulty getting out of the bathroom and family noticed that she was not moving the left side of her body normally.  She seemed to be staring towards the left and was having difficulty walking as well.  She has not been compliant any of her medications.  Patient reported some numbness on the left side.  No slurred speech or facial droop was reported at that time. CT head was performed that showed an acute hemorrhage within the paramedian inferior left cerebellar hemisphere with adjacent small volume acute subarachnoid hemorrhage-likely extension of some subarachnoid bleed into the fourth ventricle.  Further recommendations of follow-up with MRI were provided. Neurosurgery was consulted over the phone and patient was transferred to the [facility], ED. Neurosurgery reviewed patient's case and recommended neurological consultation and admission. Patient is not a very good historian. Very unclear about the last known well-sometime last night probably. LKW: Unclear tpa given?: no, ICH Premorbid modified Rankin scale (mRS):2-mostly from COPD ICH Score: 2-infratentorial, IVH.   INTERVAL HISTORY She denies headache, nausea, vomiting, or vertigo.  She is not aware of prior atrial fibrillation as mentioned in H&P.  MRI Brain shows acute right medial temporal and acute left cerebellar ischemic infarcts with hemorrhagic transformation of left cerebellar infarct only.  TTE is pending.  BP is controlled on  Cleviprex 18 mg/hr.  Tele shows NSR.      OBJECTIVE Vitals:   08/11/19 0400 08/11/19 0500 08/11/19 0530 08/11/19 0600  BP: (!) 129/55 (!) 151/66 (!) 140/51 (!) 146/58  Pulse: 80 89 87 89  Resp: 16 (!) 21 18 17   Temp: 99.1 F (37.3 C)     TempSrc: Axillary     SpO2: 94% 93% 93% 93%    CBC:  Recent Labs  Lab 08/10/19 2043  WBC 18.2*  HGB 14.0  HCT 42.7  MCV 87.5  PLT 302    Basic Metabolic Panel:  Recent Labs  Lab 08/10/19 2043  NA 140  K 3.3*  CL 103  CO2 22  GLUCOSE 175*  BUN 13  CREATININE 1.08*  CALCIUM 8.5*    Lipid Panel:     Component Value Date/Time   CHOL 260 (H) 08/10/2019 2043   TRIG 214 (H) 08/10/2019 2043   HDL 49 08/10/2019 2043   CHOLHDL 5.3 08/10/2019 2043   VLDL 43 (H) 08/10/2019 2043   LDLCALC 168 (H) 08/10/2019 2043   HgbA1c:  Lab Results  Component Value Date   HGBA1C 5.6 08/10/2019   Urine Drug Screen:     Component Value Date/Time   LABOPIA POSITIVE (A) 08/10/2019 2137   COCAINSCRNUR NONE DETECTED 08/10/2019 2137   LABBENZ NONE DETECTED 08/10/2019 2137   AMPHETMU NONE DETECTED 08/10/2019 2137   THCU NONE DETECTED 08/10/2019 2137   LABBARB NONE DETECTED 08/10/2019 2137    Alcohol Level No results found for: ETH  IMAGING  CT Head Wo Contrast 08/10/2019 IMPRESSION:  1. Redemonstrated parenchymal hemorrhage within the paramedian inferior left cerebellum with associated subarachnoid hemorrhage slightly redistributed from prior without significant interval  increase in the size of the hemorrhage or new areas of hemorrhage identified.  2. Stable parenchymal volume loss and chronic microvascular angiopathy.  3. Remote lacunar infarcts in the bilateral basal ganglia.   MRI Brain W / WO Contrast - pending 08/11/2019  DG Chest Port 1 View 08/10/2019 IMPRESSION:  1. No acute abnormality.  2. Chronic interstitial coarsening and emphysema. 3. Aortic Atherosclerosis (ICD10-I70.0).   Transthoracic Echocardiogram   00/00/2021 Pending  ECG - ST rate 114 BPM. (See cardiology reading for complete details)   PHYSICAL EXAM Blood pressure (!) 146/58, pulse 89, temperature 99.1 F (37.3 C), temperature source Axillary, resp. rate 17, SpO2 93 %.  Awake, alert, fully oriented. Language - fluent Comprehension, naming, repetition- intact. Face symmetrical. Tongue midline.   Left homonymous hemianopsia.   Strength 5/5 BUE and BLE, except for possible 4/5 LUE Coord - cannot perform FTN on the left.      ASSESSMENT/PLAN Ms. TERAH Vasquez is a 74 y.o. female with history of advanced COPD (home O2), hypercholesterolemia, ASPVD, non compliance with medications, atrial fibrillation not on anticoagulation, transfered from [facility] for evaluation of cerebellar bleed having initially presented there with left sided weakness / numbness.  She did not receive IV t-PA due to ICH.  Stroke: cerebellar bleed  Resultant  Left HHA and left dysmetria  Code Stroke CT Head - not ordered  CT head - Redemonstrated parenchymal hemorrhage within the paramedian inferior left cerebellum with associated subarachnoid hemorrhage slightly redistributed from prior without significant interval increase in the size of the hemorrhage or new areas of hemorrhage identified. Stable parenchymal volume loss and chronic microvascular angiopathy. Remote lacunar infarcts in the bilateral basal ganglia.   MRI Brain W / WO - pending  MRA head - not ordered  CTA H&N - not ordered  CT Perfusion - not ordered  Carotid Doppler - not ordered  2D Echo - pending  Sars Corona Virus 2 - negative  LDL - 168  HgbA1c - 5.6  UDS - opiates  VTE prophylaxis - SCDs Diet  Diet Order            Diet NPO time specified  Diet effective now                 aspirin 81 mg daily and clopidogrel 75 mg daily prior to admission, now on No antithrombotic  Ongoing aggressive stroke risk factor management  Therapy  recommendations:  pending  Disposition:  Pending  Hypertension  Home BP meds: Coreg  Current BP meds: prn Cleviprex and Normodyne   Stable . SBP goal < 160 mg Hg  . Long-term BP goal normotensive  Hyperlipidemia  Home Lipid lowering medication: Lipitor 20 mg daily  LDL 168, goal < 70  Current lipid lowering medication: None (statin contraindicated with ICH)  Continue statin at discharge  Other Stroke Risk Factors  Advanced age  Former cigarette smoker - quit  Atrial fibrillation  Remote infarcts by CT  Other Active Problems  Code status - Full code  NPO  Aortic Atherosclerosis (ICD10-I70.0)  COPD - O2 dependent  Hypokalemia - 3.3 -> supplement  Temp 99.1 - WBCs - 18.2 - blood cultures - pending (U/A not cw UTI) (Currently on no antibiotics) CKD - stage 3a - creatinine - 1.08 Hyperglycemia - 175 (HgBA1C - 5.6)   Hospital day # 1  Assessment/Plan:  2 ischemic infarcts in the right PCA and left PICA territories, with the latter having hemorrhagic transformation.  I suspect cardioembolism,  especially given possibility of PAF in the past; none detected here so far.  I will check intracranial and extracranial vessel status to see if there are any critical contributing stenoses.    BP is well controlled on Cleviprex.  We will slowly wean off and transition to oral BP med.    Awaiting TTE report.  Continue a. Fib surveillance.  No anticoagulation until blood is resorbed.    No evidence of significant edema or hydrocephalus at this time.  Will monitor.    Weston Settle, MS, MD  To contact Stroke Continuity provider, please refer to [email or web address]. After hours, contact General Neurology

## 2019-08-11 NOTE — Progress Notes (Signed)
Neurosurgery Service Progress Note  Subjective: No acute events overnight, no headache / N / V this morning   Objective: Vitals:   08/11/19 0400 08/11/19 0500 08/11/19 0530 08/11/19 0600  BP: (!) 129/55 (!) 151/66 (!) 140/51 (!) 146/58  Pulse: 80 89 87 89  Resp: 16 (!) 21 18 17   Temp: 99.1 F (37.3 C)     TempSrc: Axillary     SpO2: 94% 93% 93% 93%   Temp (24hrs), Avg:99.1 F (37.3 C), Min:98.1 F (36.7 C), Max:100.4 F (38 C)  CBC Latest Ref Rng & Units 08/10/2019 04/14/2016 04/12/2016  WBC 4.0 - 10.5 K/uL 18.2(H) 9.8 11.9(H)  Hemoglobin 12.0 - 15.0 g/dL 06/12/2016 66.2) 9.4(T)  Hematocrit 36 - 46 % 42.7 26.9(L) 26.2(L)  Platelets 150 - 400 K/uL 302 285 231   BMP Latest Ref Rng & Units 08/10/2019 04/14/2016 04/12/2016  Glucose 70 - 99 mg/dL 06/12/2016) 650(P) 546(F)  BUN 8 - 23 mg/dL 13 11 11   Creatinine 0.44 - 1.00 mg/dL 681(E) 7.51(Z  Sodium 135 - 145 mmol/L 140 135 134(L)  Potassium 3.5 - 5.1 mmol/L 3.3(L) 3.7 4.4  Chloride 98 - 111 mmol/L 103 100(L) 102  CO2 22 - 32 mmol/L 22 26 22   Calcium 8.9 - 10.3 mg/dL 0.01) 8.3(L) 8.2(L)    Intake/Output Summary (Last 24 hours) at 08/11/2019 0626 Last data filed at 08/11/2019 0330 Gross per 24 hour  Intake --  Output 500 ml  Net -500 ml    Current Facility-Administered Medications:  .  acetaminophen (TYLENOL) tablet 650 mg, 650 mg, Oral, Q4H PRN **OR** acetaminophen (TYLENOL) 160 MG/5ML solution 650 mg, 650 mg, Per Tube, Q4H PRN **OR** acetaminophen (TYLENOL) suppository 650 mg, 650 mg, Rectal, Q4H PRN, 4.4(H, MD .  chlorhexidine (PERIDEX) 0.12 % solution 15 mL, 15 mL, Mouth Rinse, BID, 10/12/2019, MD .  Chlorhexidine Gluconate Cloth 2 % PADS 6 each, 6 each, Topical, Q0600, 10/12/2019, MD, 6 each at 08/11/19 Milon Dikes .  labetalol (NORMODYNE) injection 20 mg, 20 mg, Intravenous, Once **AND** clevidipine (CLEVIPREX) infusion 0.5 mg/mL, 0-21 mg/hr, Intravenous, Continuous, Milon Dikes, MD, Last Rate: 26 mL/hr at 08/11/19 0508, 13  mg/hr at 08/11/19 0508 .  pantoprazole (PROTONIX) injection 40 mg, 40 mg, Intravenous, QHS, Milon Dikes, MD .  senna-docusate (Senokot-S) tablet 1 tablet, 1 tablet, Oral, BID, 10/12/19, MD   Physical Exam: AOx3, PERRL, EOMI, FS, Strength 5/5 on R, 4/5 on L, SILTx4, +dysmetria  Assessment & Plan: 74 y.o. woman w/ cerebellar ICH on ASA/plavix. 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 MRI with right PCA territory infarct also involving right PLIC.  -infarct better explains her exam findings -no clear underlying mass, suspect this may be hemorrhagic transformation of an infarct due to DAPT -no neurosurgical intervention indicated, ventricles stable  Milon Dikes  08/11/19 6:26 AM

## 2019-08-11 NOTE — Evaluation (Signed)
Clinical/Bedside Swallow Evaluation Patient Details  Name: Caitlin Vasquez MRN: [id number] Date of Birth: 1945/03/14  Today's Date: 08/11/2019 Time: SLP Start Time (ACUTE ONLY): 0715 SLP Stop Time (ACUTE ONLY): 0730 SLP Time Calculation (min) (ACUTE ONLY): 15 min  Past Medical History:  Past Medical History:  Diagnosis Date  . COPD (chronic obstructive pulmonary disease) (HCC)   . High cholesterol   . On home oxygen therapy    "2L; all the time" (04/06/2016)  . Pneumonia    "when I was a kid"   . Urinary hesitancy    Past Surgical History:  Past Surgical History:  Procedure Laterality Date  . ABDOMINAL AORTOGRAM N/A 04/08/2016   Procedure: Abdominal Aortogram;  Surgeon: Sherren Kerns, MD;  Location: [facility] INVASIVE CV LAB;  Service: Cardiovascular;  Laterality: N/A;  . CARDIAC CATHETERIZATION N/A 11/28/2014   Procedure: Right/Left Heart Cath and Coronary Angiography;  Surgeon: [name], MD;  Location: [facility] INVASIVE CV LAB;  Service: Cardiovascular;  Laterality: N/A;  . CARDIAC CATHETERIZATION  08/2006   Hattie Perch 08/25/2006  . DRESSING CHANGE UNDER ANESTHESIA Left 04/12/2016   Procedure: DRESSING CHANGE LEFT HIP AND LEFT HEEL;  Surgeon: Fransisco Hertz, MD;  Location: [facility] OR;  Service: Vascular;  Laterality: Left;  . FEMORAL-POPLITEAL BYPASS GRAFT Left 04/12/2016   Procedure: LEFT FEMORAL-POPITEAL  BYPASS GRAFT;  Surgeon: Fransisco Hertz, MD;  Location: [facility] OR;  Service: Vascular;  Laterality: Left;  . JOINT REPLACEMENT    . LOWER EXTREMITY ANGIOGRAPHY Bilateral 04/08/2016   Procedure: Lower Extremity Angiography;  Surgeon: Sherren Kerns, MD;  Location: [facility] INVASIVE CV LAB;  Service: Cardiovascular;  Laterality: Bilateral;  . PERIPHERAL VASCULAR INTERVENTION Bilateral 04/08/2016   Procedure: Peripheral Vascular Intervention;  Surgeon: Sherren Kerns, MD;  Location: [facility] INVASIVE CV LAB;  Service: Cardiovascular;  Laterality: Bilateral;  common iliac  . TOTAL HIP ARTHROPLASTY Left 01/29/2016  . VEIN  HARVEST Left 04/12/2016   Procedure: LEFT GREATER SAPHENOUS VEIN HARVEST;  Surgeon: Fransisco Hertz, MD;  Location: [facility] OR;  Service: Vascular;  Laterality: Left;   HPI:  Caitlin Vasquez is a 74 y.o. female with a  past medical history significant for advanced COPD, hypercholesterolemia, atrial fibrillation not on anticoagulation.  She presented to the emergency room as a transfer from [facility] for evaluation of cerebellar bleed.  She presented to the ED [facility] with left-sided weakness and numbness this morning.  EMS reported that she had gone to the bathroom in her usual state of health but was having difficulty getting out of the bathroom and her family noticed that she was not moving the left side of her body normally.  She seemed to be staring towards the left and was having difficulty walking as well.  She has not been compliant any of her medications.   No slurred speech or facial droop was reported at that time.  CT head was performed that showed an acute hemorrhage within the paramedian inferior left cerebellar hemisphere with adjacent small volume acute subarachnoid hemorrhage-likely extension of some subarachnoid bleed into the fourth ventricle.  Further recommendations of follow-up with MRI were suggested, study results are pending.  Neurosurgery was consulted over the phone and patient was transferred to the [facility], ED.  The patient reported that she lives with her son and has limited responsibilities at home.  PTA she was not driving, cooking, cleaning or managing bills.  She was managing her medications.  Assessment / Plan / Recommendation  Clinical Impression  Clinical swallowing evaluation was completed using thin liquids via spoon and cup, pureed material and dry solids.  Cranial nerve exam was completed and remarkable for bilateral lingual weakness.  Facial sensation was not assessed.  Her oral and pharyngeal swallow appeared to be functional.  Mastication  of dry solids appeared to be adequate.  Swallow trigger was appreciated to palpation and consistent overt s/s of aspiration were not seen.   Patient noted to have very delayed cough x 1 well after PO presentations.  She was given additional PO trials of thin liquid via assisted cup sips without obvious s/s of aspiration.  Recommend a regular diet with thin liquids.  ST will follow for therapeutic diet tolerance.   SLP Visit Diagnosis: Dysphagia, unspecified (R13.10)    Aspiration Risk  Mild aspiration risk    Diet Recommendation   Regular with thin liquids  Medication Administration: Whole meds with liquid    Other  Recommendations Oral Care Recommendations: Oral care BID   Follow up Recommendations Other (comment) (TBD)      Frequency and Duration min 2x/week  2 weeks       Prognosis Prognosis for Safe Diet Advancement: Good      Swallow Study   General Date of Onset: 08/10/19 HPI: Caitlin Vasquez is a 74 y.o. female with a  past medical history significant for advanced COPD, hypercholesterolemia, atrial fibrillation not on anticoagulation.  She presented to the emergency room as a transfer from [facility] for evaluation of cerebellar bleed.  She presented to the ED [facility] with left-sided weakness and numbness this morning.  EMS reported that she had gone to the bathroom in her usual state of health but was having difficulty getting out of the bathroom and her family noticed that she was not moving the left side of her body normally.  She seemed to be staring towards the left and was having difficulty walking as well.  She has not been compliant any of her medications.   No slurred speech or facial droop was reported at that time.  CT head was performed that showed an acute hemorrhage within the paramedian inferior left cerebellar hemisphere with adjacent small volume acute subarachnoid hemorrhage-likely extension of some subarachnoid bleed into the  fourth ventricle.  Further recommendations of follow-up with MRI were suggested, study results are pending.  Neurosurgery was consulted over the phone and patient was transferred to the [facility], ED. Type of Study: Bedside Swallow Evaluation Previous Swallow Assessment: None noted at [facility]. Diet Prior to this Study: NPO Temperature Spikes Noted: Yes Respiratory Status: Nasal cannula History of Recent Intubation: No Behavior/Cognition: Lethargic/Drowsy;Cooperative Oral Cavity Assessment: Dry Oral Care Completed by SLP: No Oral Cavity - Dentition: Adequate natural dentition Vision: Impaired for self-feeding Self-Feeding Abilities: Needs assist Patient Positioning: Upright in bed Baseline Vocal Quality: Normal Volitional Swallow: Able to elicit    Oral/Motor/Sensory Function Overall Oral Motor/Sensory Function: Within functional limits   Ice Chips Ice chips: Not tested   Thin Liquid Thin Liquid: Within functional limits Presentation: Cup;Self Fed;Spoon    Nectar Thick Nectar Thick Liquid: Not tested   Honey Thick Honey Thick Liquid: Not tested   Puree Puree: Within functional limits Presentation: Spoon   Solid     Solid: Within functional limits Presentation: Self Fed     Caitlin Aguas, Caitlin Vasquez, Caitlin Vasquez Acute Rehab SLP [phone]  Fleet Contras 08/11/2019,8:05 AM

## 2019-08-11 NOTE — Evaluation (Signed)
Physical Therapy Evaluation Patient Details Name: Caitlin Vasquez MRN: [id number] DOB: [birth year] Today's Date: 08/11/2019   History of Present Illness  74 y.o. female past medical history of advanced COPD, hypercholesterolemia, atrial fibrillation not on anticoagulation, presents to the emergency room as a transfer from [facility] for evaluation of cerebellar bleed. CT head demonstrates acute hemorrhage within the paramedian inferior left cerebellar hemisphere with adjacent small volume acute subarachnoid hemorrhage-likely extension of some subarachnoid bleed into the fourth ventricle.  Clinical Impression  Pt presents to PT with deficits in functional mobility, gait, balance, endurance, power, coordination, cognition, safety awareness, and visual-spatial awareness. Pt with significant L inattention and ataxia, requiring significant assistance to maintain sitting balance and to mobilize due to these deficits. Pt also with impaired awareness of these deficits, greatly increasing her falls risk. PT provides education on the need for increased attention to L side, asking the pt to turn her eyes and head left to greet visitors when entering hospital room. Pt will continue to benefit from acute PT POC to improve mobility quality and to reduce falls risk. PT recommends CIR at this time as the pt demonstrates the potential to make significant functional gains with high intensity inpatient therapies.    Follow Up Recommendations CIR    Equipment Recommendations  Wheelchair (measurements PT);Wheelchair cushion (measurements PT);Hospital bed    Recommendations for Other Services Rehab consult     Precautions / Restrictions Precautions Precautions: Fall Precaution Comments: SBP<140 Restrictions Weight Bearing Restrictions: No      Mobility  Bed Mobility Overal bed mobility: Needs Assistance Bed Mobility: Supine to Sit     Supine to sit: Mod assist     General bed mobility  comments: modA to scoot forward to edge, able to independently elevate trunk up from bed  Transfers Overall transfer level: Needs assistance Equipment used: 1 person hand held assist Transfers: Sit to/from [employer] Transfers Sit to Stand: Mod assist Stand pivot transfers: Mod assist       General transfer comment: pt with ataxic motion of LLE, requires PT physical assistance to mobilize and facilitate turning with LLE  Ambulation/Gait                Stairs            Wheelchair Mobility    Modified Rankin (Stroke Patients Only) Modified Rankin (Stroke Patients Only) Pre-Morbid Rankin Score: Moderate disability Modified Rankin: Severe disability     Balance Overall balance assessment: Needs assistance Sitting-balance support: Bilateral upper extremity supported;Feet supported Sitting balance-Leahy Scale: Poor Sitting balance - Comments: modA due to left lateral lean Postural control: Left lateral lean Standing balance support: Bilateral upper extremity supported Standing balance-Leahy Scale: Poor Standing balance comment: modA to maintain static standing balance with BUE support of PT                             Pertinent Vitals/Pain Pain Assessment: No/denies pain Pain Score: 8  Pain Location: Head and hand Pain Intervention(s): RN gave pain meds during session    Home Living Family/patient expects to be discharged to:: Private residence Living Arrangements: Children Available Help at Discharge: Family;Available 24 hours/day Type of Home: House Home Access: Stairs to enter Entrance Stairs-Rails: Doctor, general practice of Steps: 3 Home Layout: Two level;Able to live on main level with bedroom/bathroom Home Equipment: Gilmer Mor - single point      Prior Function Level of Independence: Needs assistance  Gait / Transfers Assistance Needed: pt reports ambulating independently for short household distances with use of  cane  ADL's / Homemaking Assistance Needed: pt reports requiring assistance for all IADLs, takes a sponge bath, performs ADLs independently        Hand Dominance   Dominant Hand: Right    Extremity/Trunk Assessment   Upper Extremity Assessment Upper Extremity Assessment: LUE deficits/detail LUE Deficits / Details: strength and ROM WFL LUE Sensation: decreased light touch LUE Coordination: decreased fine motor;decreased gross motor (increased extensor tone)    Lower Extremity Assessment Lower Extremity Assessment: LLE deficits/detail LLE Deficits / Details: strength and ROM WFL LLE Sensation: decreased light touch LLE Coordination: decreased gross motor    Cervical / Trunk Assessment Cervical / Trunk Assessment: Normal  Communication   Communication: No difficulties  Cognition Arousal/Alertness: Awake/alert Behavior During Therapy: WFL for tasks assessed/performed Overall Cognitive Status: Impaired/Different from baseline Area of Impairment: Attention;Memory;Following commands;Safety/judgement;Awareness;Problem solving                   Current Attention Level: Sustained Memory: Decreased recall of precautions;Decreased short-term memory Following Commands: Follows one step commands with increased time Safety/Judgement: Decreased awareness of safety;Decreased awareness of deficits Awareness: Intellectual Problem Solving: Slow processing;Requires verbal cues        General Comments General comments (skin integrity, edema, etc.): VSS on 3L [place], pt does desat to low 80s with attempts to wean supplemental O2. Pt only able to track to midline and not to left side    Exercises     Assessment/Plan    PT Assessment Patient needs continued PT services  PT Problem List Decreased activity tolerance;Decreased balance;Decreased mobility;Decreased coordination;Decreased cognition;Decreased knowledge of use of DME;Decreased safety awareness;Decreased knowledge of  precautions;Cardiopulmonary status limiting activity;Impaired sensation;Impaired tone       PT Treatment Interventions DME instruction;Gait training;Stair training;Functional mobility training;Therapeutic activities;Therapeutic exercise;Balance training;Neuromuscular re-education;Cognitive remediation;Patient/family education    PT Goals (Current goals can be found in the Care Plan section)  Acute Rehab PT Goals Patient Stated Goal: To improve mobility and return to baseline PT Goal Formulation: With patient Time For Goal Achievement: 08/25/19 Potential to Achieve Goals: Good    Frequency Min 4X/week   Barriers to discharge        Co-evaluation               AM-PAC PT "6 Clicks" Mobility  Outcome Measure Help needed turning from your back to your side while in a flat bed without using bedrails?: None Help needed moving from lying on your back to sitting on the side of a flat bed without using bedrails?: A Lot Help needed moving to and from a bed to a chair (including a wheelchair)?: A Lot Help needed standing up from a chair using your arms (e.g., wheelchair or bedside chair)?: A Lot Help needed to walk in hospital room?: Total Help needed climbing 3-5 steps with a railing? : Total 6 Click Score: 12    End of Session Equipment Utilized During Treatment: Oxygen Activity Tolerance: Patient tolerated treatment well Patient left: in chair;with call bell/phone within reach;with chair alarm set Nurse Communication: Mobility status PT Visit Diagnosis: Unsteadiness on feet (R26.81);Other abnormalities of gait and mobility (R26.89);Ataxic gait (R26.0);Other symptoms and signs involving the nervous system (R29.898)    Time: 7654-6503 PT Time Calculation (min) (ACUTE ONLY): 28 min   Charges:   PT Evaluation $PT Eval Moderate Complexity: 1 Mod PT Treatments $Therapeutic Activity: 8-22 mins  Arlyss Gandy, PT, DPT Acute Rehabilitation Pager: [phone]   Arlyss Gandy 08/11/2019, 11:28 AM

## 2019-08-11 NOTE — Progress Notes (Signed)
According to rounding Neuro MDs note from earlier today the patient's systolic BP parameters are to be systolic<160.  RN will modify corresponding cleviprex order to reflect this.

## 2019-08-11 NOTE — Progress Notes (Signed)
Inpatient Rehab Admissions Coordinator:   Met with patient at bedside to discuss potential CIR admission. Pt. Stated interest and states that he son can provide 24/7 support at discharge. Left message for Pt.'s son, Johnye Kist with request for call back. Will pursue for potential admit next week, pending bed availability.  Clemens Catholic, [place], [place] Admissions Coordinator  [phone] ([place]) [phone] (office)

## 2019-08-12 ENCOUNTER — Inpatient Hospital Stay (HOSPITAL_COMMUNITY): Payer: Medicare Other

## 2019-08-12 DIAGNOSIS — I63431 Cerebral infarction due to embolism of right posterior cerebral artery: Principal | ICD-10-CM

## 2019-08-12 LAB — BASIC METABOLIC PANEL
Anion gap: 13 (ref 5–15)
BUN: 14 mg/dL (ref 8–23)
CO2: 21 mmol/L — ABNORMAL LOW (ref 22–32)
Calcium: 8.2 mg/dL — ABNORMAL LOW (ref 8.9–10.3)
Chloride: 100 mmol/L (ref 98–111)
Creatinine, Ser: 0.96 mg/dL (ref 0.44–1.00)
GFR calc Af Amer: >60 mL/min (ref >60)
GFR calc non Af Amer: 58 mL/min — ABNORMAL LOW (ref >60)
Glucose, Bld: 110 mg/dL — ABNORMAL HIGH (ref 70–99)
Potassium: 4 mmol/L (ref 3.5–5.1)
Sodium: 134 mmol/L — ABNORMAL LOW (ref 135–145)

## 2019-08-12 LAB — CBC
HCT: 33.7 % — ABNORMAL LOW (ref 36.0–46.0)
Hemoglobin: 12.6 g/dL (ref 12.0–15.0)
MCH: 32.3 pg (ref 26.0–34.0)
MCHC: 37.4 g/dL — ABNORMAL HIGH (ref 30.0–36.0)
MCV: 86.4 fL (ref 80.0–100.0)
Platelets: 272 10*3/uL (ref 150–400)
RBC: 3.9 MIL/uL (ref 3.87–5.11)
RDW: 13.4 % (ref 11.5–15.5)
WBC: 11.3 10*3/uL — ABNORMAL HIGH (ref 4.0–10.5)
nRBC: 0.4 % — ABNORMAL HIGH (ref 0.0–0.2)

## 2019-08-12 MED ORDER — CARVEDILOL 12.5 MG PO TABS
12.5000 mg | ORAL_TABLET | Freq: Two times a day (BID) | ORAL | Status: DC
  Administered 2019-08-12 – 2019-08-13 (×2): 12.5 mg via ORAL
  Filled 2019-08-12 (×2): qty 1

## 2019-08-12 MED ORDER — SODIUM CHLORIDE 0.9 % IV SOLN: INTRAVENOUS | Status: DC

## 2019-08-12 MED ORDER — RESOURCE THICKENUP CLEAR PO POWD
ORAL | Status: DC | PRN
  Filled 2019-08-12: qty 125

## 2019-08-12 MED ORDER — ALBUTEROL SULFATE HFA 108 (90 BASE) MCG/ACT IN AERS: 2.0000 | INHALATION_SPRAY | Freq: Four times a day (QID) | RESPIRATORY_TRACT | Status: DC | PRN

## 2019-08-12 MED ORDER — AMLODIPINE BESYLATE 5 MG PO TABS
5.0000 mg | ORAL_TABLET | Freq: Every day | ORAL | Status: DC
  Administered 2019-08-12 – 2019-08-13 (×2): 5 mg via ORAL
  Filled 2019-08-12 (×2): qty 1

## 2019-08-12 MED ORDER — PNEUMOCOCCAL VAC POLYVALENT 25 MCG/0.5ML IJ INJ: 0.5000 mL | INJECTION | INTRAMUSCULAR | Status: DC

## 2019-08-12 MED ORDER — ALBUTEROL SULFATE (2.5 MG/3ML) 0.083% IN NEBU: 2.5000 mg | INHALATION_SOLUTION | Freq: Four times a day (QID) | RESPIRATORY_TRACT | Status: DC | PRN

## 2019-08-12 MED ORDER — SODIUM CHLORIDE 0.9 % IV SOLN
1.0000 g | INTRAVENOUS | Status: DC
  Administered 2019-08-12 – 2019-08-13 (×2): 1 g via INTRAVENOUS
  Filled 2019-08-12: qty 10
  Filled 2019-08-12: qty 1

## 2019-08-12 MED ORDER — SODIUM CHLORIDE 0.9 % IV BOLUS
500.0000 mL | Freq: Once | INTRAVENOUS | Status: AC
  Administered 2019-08-12: 500 mL via INTRAVENOUS

## 2019-08-12 MED ORDER — LABETALOL HCL 5 MG/ML IV SOLN
10.0000 mg | INTRAVENOUS | Status: DC | PRN
  Administered 2019-08-12 (×2): 20 mg via INTRAVENOUS
  Administered 2019-08-13: 10 mg via INTRAVENOUS
  Administered 2019-08-13: 20 mg via INTRAVENOUS
  Filled 2019-08-12 (×4): qty 4

## 2019-08-12 NOTE — Progress Notes (Signed)
  Speech Language Pathology Treatment: Dysphagia;Cognitive-Linquistic  Patient Details Name: Caitlin Vasquez MRN: [id number] DOB: [birth year] Today's Date: 08/12/2019 Time: 2979-8921 SLP Time Calculation (min) (ACUTE ONLY): 23 min  Assessment / Plan / Recommendation Clinical Impression  Pt will attend to the left side of her environment, but only when given cues to do so. She shows some functional problem solving and emergent awareness, and can verbalize that she needs to look to the L more, but still needs to be prompted to turn her head. Min cues were provided for sustained attention. During PO trials, pt had subtle coughing that was noted most frequently with thin liquids and only 1x prior to intake. RN does not note baseline coughing and pt denies this as well. Pt could not drink three ounces of water without stopping across two attempts with coaching provided by SLP. Otherwise, her swallow appears grossly functional as can be judged clinically. Given the above, will maintain current diet but will plan for MBS to provide a more thorough evaluation of swallowing for pt with risk factors including acute stroke and h/o advanced COPD.    HPI HPI: 74 y.o. female past medical history of advanced COPD, hypercholesterolemia, atrial fibrillation not on anticoagulation, presents to the emergency room as a transfer from [facility] for evaluation of cerebellar bleed. CT head demonstrates acute hemorrhage within the paramedian inferior left cerebellar hemisphere with adjacent small volume acute subarachnoid hemorrhage-likely extension of some subarachnoid bleed into the fourth ventricle.      SLP Plan  Continue with current plan of care;MBS       Recommendations  Diet recommendations: Regular;Thin liquid Liquids provided via: Cup;Straw Medication Administration: Whole meds with liquid Supervision: Staff to assist with self feeding Compensations: Slow rate;Small sips/bites Postural  Changes and/or Swallow Maneuvers: Seated upright 90 degrees                Oral Care Recommendations: Oral care BID Follow up Recommendations: Inpatient Rehab SLP Visit Diagnosis: Cognitive communication deficit (R41.841);Dysphagia, unspecified (R13.10) Plan: Continue with current plan of care;MBS       GO                Mahala Menghini., M.A. CCC-SLP Acute Rehabilitation Services Pager [phone] Office [phone]  08/12/2019, 10:11 AM

## 2019-08-12 NOTE — Consult Note (Signed)
Physical Medicine and Rehabilitation Consult   Reason for Consult: Cerebellar IPH with functional deficits.  Referring Physician: Dr. Pearlean Brownie.    HPI: Caitlin Vasquez is a 74 y.o. 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 MRI brain showed no change in small IPH left cerebellum with surrounding mass effect, small amount SAH and moderate ischemic infarct R-PCA with moderately advanced cerebral atrophy. Dr. Marcia Brash consulted for input and felt that no surgical intervention needed and recommended MRI w/wo contrast when able to rule out underlying mass. She was started on Cleveprex for BP control and 2 D echo done for work up.  She has had urinary retention requiring I/O caths. Therapy evaluations completed revealing deficits in attention and memory, significant left inattention with ataxia affecting functional status. CIR was recommended due to functional deficits. 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    Review of Systems  Constitutional: Negative for chills and fever.  HENT: Negative for hearing loss and tinnitus.   Eyes: Negative for blurred vision and double vision.  Respiratory: Negative for cough and shortness of breath.   Cardiovascular: Negative for chest pain and palpitations.       Leg cramps that keep her up at nights  Gastrointestinal: Positive for nausea. Negative for constipation, heartburn and vomiting.  Genitourinary: Negative for dysuria and urgency.  Musculoskeletal: Positive for back pain  (since admission.). Negative for falls, joint pain and myalgias.  Skin: Negative for rash.  Neurological: Positive for dizziness and weakness. Negative for headaches.  Psychiatric/Behavioral: The patient is not nervous/anxious and does not have insomnia.       Past Medical History:  Diagnosis Date  . COPD (chronic obstructive pulmonary disease) (HCC)   . High cholesterol   . On home oxygen therapy    "2L; all the time" (04/06/2016)  . Pneumonia    "when I was a kid"   . Urinary hesitancy     Past Surgical History:  Procedure Laterality Date  . ABDOMINAL AORTOGRAM N/A 04/08/2016   Procedure: Abdominal Aortogram;  Surgeon: Sherren Kerns, MD;  Location: [facility] INVASIVE CV LAB;  Service: Cardiovascular;  Laterality: N/A;  . CARDIAC CATHETERIZATION N/A 11/28/2014   Procedure: Right/Left Heart Cath and Coronary Angiography;  Surgeon: [name], MD;  Location: [facility] INVASIVE CV LAB;  Service: Cardiovascular;  Laterality: N/A;  . CARDIAC CATHETERIZATION  08/2006   Hattie Perch 08/25/2006  . DRESSING CHANGE UNDER ANESTHESIA Left 04/12/2016   Procedure: DRESSING CHANGE LEFT HIP AND LEFT HEEL;  Surgeon: Fransisco Hertz, MD;  Location: [facility] OR;  Service: Vascular;  Laterality: Left;  . FEMORAL-POPLITEAL BYPASS GRAFT Left 04/12/2016   Procedure: LEFT FEMORAL-POPITEAL  BYPASS GRAFT;  Surgeon: Fransisco Hertz, MD;  Location: [facility] OR;  Service: Vascular;  Laterality: Left;  . JOINT REPLACEMENT    . LOWER EXTREMITY ANGIOGRAPHY Bilateral 04/08/2016   Procedure: Lower Extremity Angiography;  Surgeon: Sherren Kerns, MD;  Location: [facility] INVASIVE CV LAB;  Service: Cardiovascular;  Laterality: Bilateral;  . PERIPHERAL VASCULAR INTERVENTION Bilateral 04/08/2016   Procedure: Peripheral Vascular Intervention;  Surgeon:  Sherren Kerns, MD;  Location: [facility] INVASIVE CV LAB;  Service: Cardiovascular;  Laterality: Bilateral;  common iliac  . TOTAL HIP ARTHROPLASTY Left 01/29/2016  . VEIN HARVEST Left 04/12/2016   Procedure: LEFT GREATER  SAPHENOUS VEIN HARVEST;  Surgeon: Fransisco Hertz, MD;  Location: [facility] OR;  Service: Vascular;  Laterality: Left;    Family History  Problem Relation Age of Onset  . Heart attack Father     Social History:  Lives with son (who manges home/cooks-does not work).  Sedentary with limited mobility--lives in her recliner. Uses cane for ambulation. She reports that she quit smoking about 5 years ago. Her smoking use included cigarettes. She has a 55.00 pack-year smoking history. She uses E- cigarettes daily. She reports that she does not drink alcohol and does not use drugs.   Allergies  Allergen Reactions  . Clonidine Derivatives Other (See Comments)    hypotension    Medications Prior to Admission  Medication Sig Dispense Refill  . acetaminophen (TYLENOL) 500 MG tablet Take 500 mg by mouth 2 (two) times daily as needed for mild pain.    Marland Kitchen albuterol (PROVENTIL HFA;VENTOLIN HFA) 108 (90 BASE) MCG/ACT inhaler Inhale 2 puffs into the lungs every 6 (six) hours as needed for wheezing or shortness of breath.    Marland Kitchen aspirin EC 81 MG EC tablet Take 1 tablet (81 mg total) by mouth daily. 30 tablet 3  . atorvastatin (LIPITOR) 20 MG tablet Take 1 tablet (20 mg total) by mouth 3 (three) times a week. (Patient not taking: Reported on 08/10/2019) 30 tablet 3  . carvedilol (COREG) 12.5 MG tablet Take 1 tablet (12.5 mg total) by mouth 2 (two) times daily with a meal. (Patient not taking: Reported on 08/10/2019) 60 tablet 3  . clopidogrel (PLAVIX) 75 MG tablet Take 1 tablet (75 mg total) by mouth daily. (Patient not taking: Reported on 08/10/2019) 30 tablet 3  . collagenase (SANTYL) ointment Apply topically daily. Apply to left heel daily and cover with dry gauze. Use gauze wrap on top to keep in place. (Patient not taking: Reported on 08/10/2019) 15 g 2  . docusate sodium (COLACE) 100 MG capsule Take 1 capsule (100 mg total) by mouth daily. (Patient not taking: Reported on 08/10/2019) 30 capsule 3  . feeding supplement, ENSURE  ENLIVE, (ENSURE ENLIVE) LIQD Take 237 mLs by mouth 2 (two) times daily between meals. (Patient not taking: Reported on 08/10/2019) 60 Bottle 12  . ferrous sulfate 325 (65 FE) MG tablet Take 1 tablet (325 mg total) by mouth 2 (two) times daily with a meal. (Patient not taking: Reported on 08/10/2019) 60 tablet 3  . mupirocin ointment (BACTROBAN) 2 % Place 1 application into the nose 2 (two) times daily. (Patient not taking: Reported on 08/10/2019) 22 g 0  . pantoprazole (PROTONIX) 40 MG tablet Take 1 tablet (40 mg total) by mouth daily. (Patient not taking: Reported on 08/10/2019) 30 tablet 3  . potassium chloride SA (K-DUR,KLOR-CON) 10 MEQ tablet Take 1 tablet (10 mEq total) by mouth daily. (Patient not taking: Reported on 08/10/2019) 30 tablet 3    Home: Home Living Family/patient expects to be discharged to:: Private residence Living Arrangements: Children Available Help at Discharge: Family, Available 24 hours/day Type of Home: House Home Access: Stairs to enter [employer] of Steps: 3 Entrance Stairs-Rails: Right, Left Home Layout: Two level, Able to live on main level with bedroom/bathroom Bathroom Shower/Tub:  (pt performs bird baths) Bathroom Toilet: Standard Bathroom Accessibility: No Home  Equipment: Gilmer MorCane - single point  Lives With: Son  Functional History: Prior Function Level of Independence: Needs assistance Gait / Transfers Assistance Needed: pt reports ambulating independently for short household distances with use of cane ADL's / Homemaking Assistance Needed: pt reports requiring assistance for all IADLs, takes a sponge bath, performs ADLs independently Functional Status:  Mobility: Bed Mobility Overal bed mobility: Needs Assistance Bed Mobility: Supine to Sit Supine to sit: Mod assist General bed mobility comments: modA to scoot forward to edge, able to independently elevate trunk up from bed Transfers Overall transfer level: Needs assistance Equipment used: 1  person hand held assist Transfers: Sit to/from Stand, Stand Pivot Transfers Sit to Stand: Mod assist Stand pivot transfers: Mod assist General transfer comment: pt with ataxic motion of LLE, requires PT physical assistance to mobilize and facilitate turning with LLE      ADL:    Cognition: Cognition Overall Cognitive Status: Impaired/Different from baseline Arousal/Alertness: Awake/alert Orientation Level: Oriented X4 Attention: Sustained Sustained Attention: Impaired Sustained Attention Impairment: Verbal basic Memory: Impaired Memory Impairment: Decreased recall of new information Awareness: Impaired Awareness Impairment: Intellectual impairment Problem Solving: Appears intact Behaviors: Restless Cognition Arousal/Alertness: Awake/alert Behavior During Therapy: WFL for tasks assessed/performed Overall Cognitive Status: Impaired/Different from baseline Area of Impairment: Attention, Memory, Following commands, Safety/judgement, Awareness, Problem solving Current Attention Level: Sustained Memory: Decreased recall of precautions, Decreased short-term memory Following Commands: Follows one step commands with increased time Safety/Judgement: Decreased awareness of safety, Decreased awareness of deficits Awareness: Intellectual Problem Solving: Slow processing, Requires verbal cues   Blood pressure (!) 144/58, pulse 82, temperature 99.2 F (37.3 C), temperature source Oral, resp. rate 13, SpO2 94 %.  Physical Exam  General: Working with PT. On commode.  HEENT: Head is normocephalic, atraumatic, PERRLA, EOMI, sclera anicteric, oral mucosa pink and moist, dentition intact, ext ear canals clear,  Neck: Supple without JVD or lymphadenopathy Heart: Reg rate and rhythm. No murmurs rubs or gallops Chest: Wheezing (occasional auditory sounds) present.  Abdomen: Soft, non-tender, non-distended, bowel sounds positive. Extremities: No clubbing, cyanosis, or edema. Pulses are  2+ Skin: Skin is dry. IV in place. Onychomycotic toenails. Dry flaky skin bilateral feet.   Neuro: Mental Status: She is oriented to person, place, and time.     Comments: Left inattention with tendency to slump to the left. Left visual field deficits but able to scan to the left with cues. Left sided weakness with ataxia.  4/5 strength LUE, strength is otherwise intact. Difficulty with finger-to-nose on left Psych: pleasant affect, cooperative  No results found for this or any previous visit (from the past 24 hour(s)). CT ANGIO HEAD W OR WO CONTRAST  Result Date: 08/11/2019 CLINICAL DATA:  74 year old female code stroke presentation yesterday with acute left cerebellar hemorrhage. Ataxia. And MRI reveals superimposed acute right PCA territory infarct. Motion degraded [facility] EXAM: CT ANGIOGRAPHY HEAD AND NECK TECHNIQUE: Multidetector CT imaging of the head and neck was performed using the standard protocol during bolus administration of intravenous contrast. Multiplanar CT image reconstructions and MIPs were obtained to evaluate the vascular anatomy. Carotid stenosis measurements (when applicable) are obtained utilizing NASCET criteria, using the distal internal carotid diameter as the denominator. CONTRAST:  50mL OMNIPAQUE IOHEXOL 350 MG/ML SOLN COMPARISON:  Brain MRI, head CT, and CTA head and neck 08/10/2019. FINDINGS: CTA NECK Skeleton: Most of the dentition is absent. Multilevel cervical spine degeneration with some levels of posterior element ankylosis. Mild T5 compression fracture appears  nonacute. No acute osseous abnormality identified. Upper chest: Centrilobular emphysema throughout the visible lungs. No superior mediastinal lymphadenopathy. Other neck: Negative. Aortic arch: Extensive Calcified aortic atherosclerosis. Including a small area of ulcerated plaque at the distal arch on series 5, image 155, stable. Three vessel arch configuration. Right  carotid system: Brachiocephalic and right CCA soft and calcified plaque without stenosis. Calcified plaque at the right ICA origin and bulb without stenosis. However, there is a focal 65 % stenosis just distal to the bulb best seen on series 8, image 92 which appears related to either soft plaque or perhaps a carotid web. The right ICA remains patent to the skull base. Left carotid system: Abundant soft and calcified plaque at the proximal left CCA with less than 50% stenosis. Complex calcified plaque at the left ICA origin, and at the distal bulb a short segment high-grade radiographic string sign stenosis occurs as seen on series 9, image 95. The left ICA remains patent to the skull base. Vertebral arteries: Proximal right subclavian artery plaque without stenosis. Dominant right vertebral artery with calcified plaque near the origin but no stenosis. Mild right V1 through V3 segment calcified plaque and tortuosity but no significant stenosis to the skull base. Proximal left subclavian artery soft and calcified plaque without significant stenosis. Non dominant left vertebral artery with severe origin stenosis (series 8, image 125) and tandem severe stenosis in the proximal V2 segment. Additional severe left V3 segment stenosis (series 8, image 118). The vessel is diminutive and functionally terminates or is occluded at the dura. CTA HEAD Posterior circulation: Right vertebral artery supplies the basilar with V4 segment calcification resulting in up to moderate stenosis on series 7, image 138, which is proximal to the patent right PICA origin. Patent vertebrobasilar junction with probable retrograde supply to the left PICA which remains patent. Area of left cerebellar intra-axial hemorrhage on series 7, image 138. No CTA spot sign. No abnormal vascularity. Patent basilar artery without stenosis. Probable infundibulum rather than 3 mm saccular aneurysm at the left basilar tip, origin of the left SCA. Right SCA and  left PCA origins are within normal limits. Posterior communicating arteries are diminutive or absent. However, just past the right PCA origin there is severe right PCA stenosis, and a diminutive appearance of the remainder of the vessel. No right PCA occlusion. Left PCA branches are mildly irregular. Anterior circulation: Both ICA siphons are patent but heavily calcified. On the left there is mild stenosis, but a superimposed 3-4 mm saccular aneurysm directed inferiorly from the distal left ICA best seen on series 9, image 84. On the right there is mild to moderate siphon stenosis in the cavernous and supraclinoid segments. Patent carotid termini. Patent MCA and ACA origins. Moderate stenosis of the right ACA A1 segment (series 11, image 19). Anterior communicating artery within normal limits. Median artery of the corpus callosum (normal variant). Mild bilateral distal ACA branch irregularity (series 12, image 16). Left MCA M1 segment and bifurcation are patent, but there is a severe stenosis at the origin of the superior left M2 division (series 9, image 102 and series 11, image 119). However, no right MCA branch occlusion is identified. Left MCA M1 segment bifurcates early without stenosis. Right MCA branches are mildly irregular. Venous sinuses: Patent. Anatomic variants: Dominant right vertebral artery which supplies the basilar. Review of the MIP images confirms the above findings IMPRESSION: 1. Negative for CTA spot sign or abnormal vascularity in the region of the left cerebellar parenchymal hemorrhage. 2.  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 3. Positive also for severe atherosclerosis throughout the head and neck. Numerous hemodynamically significant stenoses  including: - tandem Severe stenoses of the non-dominant Left Vertebral Artery which is functionally occluded at the skull base. - high-grade stenosis at the Left ICA distal bulb resulting in a short segment radiographic string sign stenosis. - 65% stenosis at the Right ICA distal bulb related to either soft plaque versus carotid web. - mild to moderate bilateral ICA siphon stenosis greater on the right. - Severe stenosis of the proximal Right PCA which is diminutive but does not occlude. - moderate stenosis of the dominant Right Vertebral Artery V4 segment which supplies the Basilar. - moderate stenosis Right ACA A1 segment. - Severe stenosis Right MCA M2 superior division origin. 4. Aortic Atherosclerosis (ICD10-I70.0) and Emphysema (ICD10-J43.9). Electronically Signed   By: Odessa Fleming M.D.   On: 08/11/2019 12:33   CT Head Wo Contrast  Result Date: 08/10/2019 CLINICAL DATA:  Ataxia, stroke suspected EXAM: CT HEAD WITHOUT CONTRAST TECHNIQUE: Contiguous axial images were obtained from the base of the skull through the vertex without intravenous contrast. COMPARISON:  CT and CT angiography 08/10/2019 FINDINGS: Brain: Redemonstration of the parenchymal hemorrhage within the paramedian inferior left cerebellar hemisphere with associated adjacent subarachnoid hemorrhage into the fourth ventricle and extending laterally into the basal cisterns. Overall extent of this hemorrhage is not significantly changed from the comparison CT. No new areas of hyperdense hemorrhage are seen. No significant resulting mass effect. No CT evidence of a large acute vascular territory or cortically based infarct. Several stable hypoattenuating foci in the bilateral basal ganglia likely reflect sequela of remote lacunar infarcts. Benign senescent mineralization of the basal ganglia, similar to prior. Symmetric prominence of the ventricles, cisterns and sulci compatible with parenchymal volume loss. Patchy areas of white matter hypoattenuation  are most compatible with chronic microvascular angiopathy. Vascular: Extensive calcification within the carotid siphons and right intradural vertebral artery. Skull: No calvarial fracture or suspicious osseous lesion. No scalp swelling or hematoma. Sinuses/Orbits: Paranasal sinuses and mastoid air cells are predominantly clear. Included orbital structures are unremarkable. Other: Absence of the maxillary dentition with the mandibular bridge. IMPRESSION: 1. Redemonstrated parenchymal hemorrhage within the paramedian inferior left cerebellum with associated subarachnoid hemorrhage slightly redistributed from prior without significant interval increase in the size of the hemorrhage or new areas of hemorrhage identified. 2. Stable parenchymal volume loss and chronic microvascular angiopathy. 3. Remote lacunar infarcts in the bilateral basal ganglia. Electronically Signed   By: Kreg Shropshire M.D.   On: 08/10/2019 20:08   CT ANGIO NECK W OR WO CONTRAST  Result Date: 08/11/2019 CLINICAL DATA:  74 year old female code stroke presentation yesterday with acute left cerebellar hemorrhage. Ataxia. And MRI reveals superimposed acute right PCA territory infarct. Motion degraded CTA head and neck at [facility] yesterday. EXAM: CT ANGIOGRAPHY HEAD AND NECK TECHNIQUE: Multidetector CT imaging of the head and neck was performed using the standard protocol during bolus administration of intravenous contrast. Multiplanar CT image reconstructions and MIPs were obtained to evaluate the vascular anatomy. Carotid stenosis measurements (when applicable) are obtained utilizing NASCET criteria, using the distal internal carotid diameter as the denominator. CONTRAST:  50mL OMNIPAQUE IOHEXOL 350 MG/ML SOLN COMPARISON:  Brain MRI, head CT, and CTA head  and neck 08/10/2019. FINDINGS: CTA NECK Skeleton: Most of the dentition is absent. Multilevel cervical spine degeneration with some levels of posterior element ankylosis. Mild T5  compression fracture appears nonacute. No acute osseous abnormality identified. Upper chest: Centrilobular emphysema throughout the visible lungs. No superior mediastinal lymphadenopathy. Other neck: Negative. Aortic arch: Extensive Calcified aortic atherosclerosis. Including a small area of ulcerated plaque at the distal arch on series 5, image 155, stable. Three vessel arch configuration. Right carotid system: Brachiocephalic and right CCA soft and calcified plaque without stenosis. Calcified plaque at the right ICA origin and bulb without stenosis. However, there is a focal 65 % stenosis just distal to the bulb best seen on series 8, image 92 which appears related to either soft plaque or perhaps a carotid web. The right ICA remains patent to the skull base. Left carotid system: Abundant soft and calcified plaque at the proximal left CCA with less than 50% stenosis. Complex calcified plaque at the left ICA origin, and at the distal bulb a short segment high-grade radiographic string sign stenosis occurs as seen on series 9, image 95. The left ICA remains patent to the skull base. Vertebral arteries: Proximal right subclavian artery plaque without stenosis. Dominant right vertebral artery with calcified plaque near the origin but no stenosis. Mild right V1 through V3 segment calcified plaque and tortuosity but no significant stenosis to the skull base. Proximal left subclavian artery soft and calcified plaque without significant stenosis. Non dominant left vertebral artery with severe origin stenosis (series 8, image 125) and tandem severe stenosis in the proximal V2 segment. Additional severe left V3 segment stenosis (series 8, image 118). The vessel is diminutive and functionally terminates or is occluded at the dura. CTA HEAD Posterior circulation: Right vertebral artery supplies the basilar with V4 segment calcification resulting in up to moderate stenosis on series 7, image 138, which is proximal to the  patent right PICA origin. Patent vertebrobasilar junction with probable retrograde supply to the left PICA which remains patent. Area of left cerebellar intra-axial hemorrhage on series 7, image 138. No CTA spot sign. No abnormal vascularity. Patent basilar artery without stenosis. Probable infundibulum rather than 3 mm saccular aneurysm at the left basilar tip, origin of the left SCA. Right SCA and left PCA origins are within normal limits. Posterior communicating arteries are diminutive or absent. However, just past the right PCA origin there is severe right PCA stenosis, and a diminutive appearance of the remainder of the vessel. No right PCA occlusion. Left PCA branches are mildly irregular. Anterior circulation: Both ICA siphons are patent but heavily calcified. On the left there is mild stenosis, but a superimposed 3-4 mm saccular aneurysm directed inferiorly from the distal left ICA best seen on series 9, image 84. On the right there is mild to moderate siphon stenosis in the cavernous and supraclinoid segments. Patent carotid termini. Patent MCA and ACA origins. Moderate stenosis of the right ACA A1 segment (series 11, image 19). Anterior communicating artery within normal limits. Median artery of the corpus callosum (normal variant). Mild bilateral distal ACA branch irregularity (series 12, image 16). Left MCA M1 segment and bifurcation are patent, but there is a severe stenosis at the origin of the superior left M2 division (series 9, image 102 and series 11, image 119). However, no right MCA branch occlusion is identified. Left MCA M1 segment bifurcates early without stenosis. Right MCA branches are mildly irregular. Venous sinuses: Patent. Anatomic variants: Dominant right vertebral artery which supplies the basilar.  Review of the MIP images confirms the above findings IMPRESSION: 1. Negative for CTA spot sign or abnormal vascularity in the region of the left cerebellar parenchymal hemorrhage. 2.  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 3. Positive also for severe atherosclerosis throughout the head and neck. 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 - high-grade stenosis at the Left ICA distal bulb resulting in a short segment radiographic string sign stenosis. - 65% stenosis at the Right ICA distal bulb related to either soft plaque versus carotid web. - mild to moderate bilateral ICA siphon stenosis greater on the right. - Severe stenosis of the proximal Right PCA which is diminutive but does not occlude. - moderate stenosis of the dominant Right Vertebral Artery V4 segment which supplies the Basilar. - moderate stenosis Right ACA A1 segment. - Severe stenosis Right MCA M2 superior division origin. 4. Aortic Atherosclerosis (ICD10-I70.0) and Emphysema (ICD10-J43.9). Electronically Signed   By: Odessa Fleming M.D.   On: 08/11/2019 12:33   MR BRAIN W WO CONTRAST  Result Date: 08/11/2019 CLINICAL DATA:  Follow-up examination for acute intracranial hemorrhage, stroke. EXAM: MRI HEAD WITHOUT AND WITH CONTRAST TECHNIQUE: Multiplanar, multiecho pulse sequences of the brain and surrounding structures were obtained without and with intravenous contrast. CONTRAST:  5.24mL GADAVIST GADOBUTROL 1 MMOL/ML IV SOLN COMPARISON:  Prior CTs from earlier the same day. FINDINGS: Brain: Examination moderately to severely degraded by motion artifact. Diffuse prominence of the CSF containing spaces compatible with moderately advanced age-related cerebral atrophy. Patchy and confluent T2/FLAIR hyperintensity within the periventricular deep white matter both  cerebral hemispheres most consistent with chronic small vessel ischemic disease, moderate nature. Scattered superimposed remote lacunar infarcts present about the bilateral basal ganglia and thalami. Patchy and confluent areas of restricted diffusion involving the parasagittal right temporal occipital region including the right hippocampal formation, consistent with an acute right PCA territory infarct. Patchy involvement of the right thalamus noted. No associated hemorrhage or mass effect. Previously identified parenchymal hemorrhage involving the inferior left cerebellum again seen, not significantly changed in size measuring 1.4 x 1.2 x 1.5 cm (series 10, image 5). Mild surrounding vasogenic edema without significant regional mass effect. Associated small volume subarachnoid hemorrhage within the adjacent posterior fossa, better appreciated on prior CT. No appreciable underlying mass lesion, vascular abnormality, or abnormal enhancement, although evaluation degraded by motion. No other evidence for acute or subacute infarct. Gray-white matter differentiation otherwise maintained. No encephalomalacia to suggest chronic cortical infarction elsewhere within the brain. No other evidence for acute or chronic intracranial hemorrhage. No mass lesion, midline shift or mass effect. No hydrocephalus or extra-axial fluid collection. Pituitary gland grossly within normal limits. Midline structures intact. No other abnormal enhancement. Vascular: Major intracranial vascular flow voids are maintained. Skull and upper cervical spine: Craniocervical junction grossly within normal limits. Bone marrow signal intensity within normal limits. No scalp soft tissue abnormality. Sinuses/Orbits: Globes and orbital soft tissues within normal limits. Paranasal sinuses are largely clear. No mastoid effusion. Inner ear structures grossly normal. Other: None. IMPRESSION: 1. Technically limited exam due to extensive motion artifact. 2. No  significant interval change in size of small intraparenchymal hemorrhage involving the inferior left cerebellum. Surrounding mild vasogenic edema without  significant regional mass effect. Associated small volume subarachnoid hemorrhage better evaluated on prior CT. No appreciable underlying mass lesion or other abnormality identified. 3. 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 4. Underlying moderately advanced cerebral atrophy with chronic microvascular ischemic disease, with multiple remote lacunar infarcts about the deep gray nuclei bilaterally. Electronically Signed   By: Rise Mu M.D.   On: 08/11/2019 00:30   DG Chest Port 1 View  Result Date: 08/10/2019 CLINICAL DATA:  COPD. EXAM: PORTABLE CHEST 1 VIEW COMPARISON:  Radiograph earlier this day at [facility] FINDINGS: The cardiomediastinal contours are normal. Aortic atherosclerosis. Chronic interstitial coarsening and emphysema. Pulmonary vasculature is normal. No consolidation, pleural effusion, or pneumothorax. No acute osseous abnormalities are seen. IMPRESSION: 1. No acute abnormality. 2. Chronic interstitial coarsening and emphysema. Aortic Atherosclerosis (ICD10-I70.0). Electronically Signed   By: Narda Rutherford M.D.   On: 08/10/2019 23:31     Assessment/Plan: Diagnosis: Cerebellar hemorrhage 1. Does the need for close, 24 hr/day medical supervision in concert with the patient's rehab needs make it unreasonable for this patient to be served in a less intensive setting? Yes 2. 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 Due to bladder management, bowel management, safety, skin/wound care, disease management, medication administration, pain management and patient education, does the  patient require 24 hr/day rehab nursing? Yes 4. Does the patient require coordinated care of a physician, rehab nurse, therapy disciplines of PT, OT, SLP to address physical and functional deficits in the context of the above medical diagnosis(es)? Yes Addressing deficits in the following areas: balance, endurance, locomotion, strength, transferring, bowel/bladder control, bathing, dressing, feeding, grooming, toileting and psychosocial support, cognition 5. Can the patient actively participate in an intensive therapy program of at least 3 hrs of therapy per day at least 5 days per week? Yes 6. The potential for patient to make measurable gains while on inpatient rehab is good 7. Anticipated functional outcomes upon discharge from inpatient rehab are min assist  with PT, min assist with OT, supervision with SLP. 8. Estimated rehab length of stay to reach the above functional goals is: 12-16 days 9. Anticipated discharge destination: Home 10. Overall Rehab/Functional Prognosis: good  RECOMMENDATIONS: This patient's condition is appropriate for continued rehabilitative care in the following setting: CIR Patient has agreed to participate in recommended program. Yes Note that insurance prior authorization may be required for reimbursement for recommended care.  Comment: Caitlin Vasquez would be an excellent CIR candidate if family support can be confirmed as she will likely require supervision upon discharge from CIR.   Jacquelynn Cree, PA-C 08/12/2019   I have personally performed a face to face diagnostic evaluation, including, but not limited to relevant history and physical exam findings, of this patient and developed relevant assessment and plan.  Additionally, I have reviewed and concur with the physician assistant's documentation above.  [name], MD

## 2019-08-12 NOTE — Progress Notes (Signed)
Inpatient Rehabilitation-Admissions Coordinator   Followed up with pt at the bedside after PM&R consult. (Please see formal consult by Dr. Carlis Abbott for details). Discussed recommended rehab program, anticipated LOS, expectations, and anticipated functional outcomes with the patient. She expressed an interest in IP Rehab program and gave me permission to follow up with her son, Sharia Reeve. Spoke to [employer] on the phone and he confirmed 24/7 A and agrees with CIR for preferred program. [facility] will follow for medical readiness and possible CIR admit.   Please call if questions.   Cheri Rous, OTR/L  Rehab Admissions Coordinator  [phone] 08/12/2019 2:10 PM

## 2019-08-12 NOTE — PMR Pre-admission (Signed)
PMR Admission Coordinator Pre-Admission Assessment  Patient: Caitlin Vasquez is an 74 y.o., female MRN: [id number] DOB: 1945-08-05 Height:   Weight:                Insurance Information HMO:     PPO:      PCP:      IPA:      80/20: yes     OTHER:  PRIMARY: Medicare A and B      Policy#: [id number]      Subscriber: patient CM Name:       Phone#:      Fax#:  Pre-Cert#:       Employer:  Benefits:  Phone #:      Name: verified eligibility online via [place] on 08/12/19 Eff. Date: part A and B effective 01/07/2009     Deduct: $1,484      Out of Pocket Max: NA      Life Max: NA  CIR: Covered per Medicare guidelines once yearly deductible has been met      SNF: days 1-20, 100%, days 21-100, 80%.  Outpatient: 80%     Co-Pay: 20% Home Health: 100%      Co-Pay:  DME: 80%     Co-Pay: 20% Providers: pt's choice SECONDARY: [employer]      Policy#: [id number]      Phone#: [phone]  Financial Counselor:       Phone#:   The "Data Collection Information Summary" for patients in Inpatient Rehabilitation Facilities with attached "Privacy Act [place] Records" was provided and verbally reviewed with: Patient  Emergency Contact Information Contact Information    Name Relation Home Work Mobile   [place] Son [phone]  [phone]     Current Medical History  Patient Admitting Diagnosis: Cerebellar hemorrhage  History of Present Illness: Caitlin Vasquez is a 74 y.o. RH-female with history of CAF-no AC, COPD--has been off oxygen for years,  PAD who was admitted via [facility] on 08/10/19 with HA, left inattention and difficulty walking due to acute left cerebellar hemorrhage with intraventricular extension. 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 MRI brain showed no change in  small IPH left cerebellum with surrounding mass effect, small amount SAH and moderate ischemic infarct R-PCA with moderately advanced cerebral atrophy. Dr. Joaquim Nam consulted for input and felt that no surgical intervention needed and recommended MRI w/wo contrast when able to rule out underlying mass. She was started on Cleveprex for BP control and 2 D echo done for work up.  She has had urinary retention requiring I/O caths. Therapy evaluations completed revealing deficits in attention and memory, significant left inattention with ataxia affecting functional status. CIR was recommended due to functional deficits. 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 Pt is to admit to CIR on 08/13/19.   Complete NIHSS TOTAL: 10 Glasgow Coma Scale Score: 15  Past Medical History  Past Medical History:  Diagnosis Date  . COPD (chronic obstructive pulmonary disease) ([place])   . High cholesterol   . On home oxygen therapy    "2L; all the time" (04/06/2016)  . Pneumonia    "when I was a kid"   . Urinary hesitancy     Family History  family history includes Heart attack in her father.  Prior Rehab/Hospitalizations:  Has the patient  had prior rehab or hospitalizations prior to admission? No  Has the patient had major surgery during 100 days prior to admission? No  Current Medications   Current Facility-Administered Medications:  .  0.9 %  sodium chloride infusion, , Intravenous, Continuous, Biby, Sharon L, NP, Last Rate: 50 mL/hr at 08/13/19 0800, Rate Verify at 08/13/19 0800 .  acetaminophen (TYLENOL) tablet 650 mg, 650 mg, Oral, Q4H PRN, 650 mg at 08/13/19 0517 **OR** acetaminophen (TYLENOL) 160 MG/5ML solution 650 mg, 650 mg, Per Tube, Q4H PRN **OR** acetaminophen (TYLENOL) suppository 650 mg, 650 mg, Rectal, Q4H PRN, Amie Portland, MD .  albuterol (PROVENTIL) (2.5 MG/3ML) 0.083% nebulizer solution 2.5 mg, 2.5 mg, Nebulization, Q6H PRN, Leonie Man, Pramod S, MD .   amLODipine (NORVASC) tablet 5 mg, 5 mg, Oral, Daily, Biby, Sharon L, NP, 5 mg at 08/13/19 0910 .  carvedilol (COREG) tablet 12.5 mg, 12.5 mg, Oral, BID WC, Biby, Sharon L, NP, 12.5 mg at 08/13/19 0910 .  cefTRIAXone (ROCEPHIN) 1 g in sodium chloride 0.9 % 100 mL IVPB, 1 g, Intravenous, Q24H, Biby, Sharon L, NP, Last Rate: 200 mL/hr at 08/13/19 1157, 1 g at 08/13/19 1157 .  chlorhexidine (PERIDEX) 0.12 % solution 15 mL, 15 mL, Mouth Rinse, BID, Amie Portland, MD, 15 mL at 08/13/19 0912 .  Chlorhexidine Gluconate Cloth 2 % PADS 6 each, 6 each, Topical, Q0600, Amie Portland, MD, 6 each at 08/12/19 1200 .  enoxaparin (LOVENOX) injection 40 mg, 40 mg, Subcutaneous, Q24H, von Dohlen, Haley B, RPH, 40 mg at 08/13/19 1158 .  labetalol (NORMODYNE) injection 10-20 mg, 10-20 mg, Intravenous, Q2H PRN, Biby, Sharon L, NP, 20 mg at 08/13/19 0518 .  ondansetron (ZOFRAN) injection 4 mg, 4 mg, Intravenous, Q6H PRN, Rogue Jury, MD, 4 mg at 08/12/19 1929 .  pantoprazole (PROTONIX) EC tablet 40 mg, 40 mg, Oral, QHS, Garvin Fila, MD, 40 mg at 08/12/19 2140 .  pneumococcal 23 valent vaccine (PNEUMOVAX-23) injection 0.5 mL, 0.5 mL, Intramuscular, Tomorrow-1000, Sethi, Lucy [place], MD .  Resource ThickenUp Clear, , Oral, PRN, Garvin Fila, MD .  senna-docusate (Senokot-S) tablet 1 tablet, 1 tablet, Oral, BID, Amie Portland, MD, 1 tablet at 08/13/19 0910 .  temazepam (RESTORIL) capsule 15 mg, 15 mg, Oral, QHS PRN, Rogue Jury, MD, 15 mg at 08/12/19 1925  Patients Current Diet:  Diet Order            DIET DYS 3 Room service appropriate? Yes with Assist; Fluid consistency: Nectar Thick  Diet effective now                 Precautions / Restrictions Precautions Precautions: Fall Precaution Comments: severe L sided neglect/inattention Restrictions Weight Bearing Restrictions: No   Has the patient had 2 or more falls or a fall with injury in the past year?No  Prior Activity Level    Prior  Functional Level Prior Function Level of Independence: Needs assistance Gait / Transfers Assistance Needed: pt reports ambulating independently for short household distances with use of cane ADL's / Homemaking Assistance Needed: pt reports requiring assistance for all IADLs, takes a sponge bath, performs ADLs independently  Self Care: Did the patient need help bathing, dressing, using the toilet or eating?  Independent for toileting, dressing, and eating; Assist for bathing 1/week from aide.   Indoor Mobility: Did the patient need assistance with walking from room to room (with or without device)? Independent  Stairs: Did the patient need assistance with internal or external stairs (with or without  device)? Independent  Functional Cognition: Did the patient need help planning regular tasks such as shopping or remembering to take medications? Needed some help  Home Assistive Devices / [place] Devices/Equipment: Cane (specify quad or straight), Walker (specify type) Home Equipment: Cane - single point  Prior Device Use: Indicate devices/aids used by the patient prior to current illness, exacerbation or injury? cane  Current Functional Level Cognition  Arousal/Alertness: Awake/alert Overall Cognitive Status: Impaired/Different from baseline Current Attention Level: Sustained Orientation Level: Oriented X4 Following Commands: Follows one step commands with increased time Safety/Judgement: Decreased awareness of safety, Decreased awareness of deficits (severe L sided neglect) General Comments: pt requiring max directional and verbal cues to acknowledge L side Attention: Sustained Sustained Attention: Impaired Sustained Attention Impairment: Verbal basic Memory: Impaired Memory Impairment: Decreased recall of new information Awareness: Impaired Awareness Impairment: Intellectual impairment Problem Solving: Appears intact Behaviors: Restless    Extremity  Assessment (includes Sensation/Coordination)  Upper Extremity Assessment: LUE deficits/detail LUE Deficits / Details: Abnormal sensory motor function with "alien arm" movement patterns; poor coordination but attempting to use LUE - required hand over hand to grasp sheet and pull it up; poor proprioceptive input - hitting LUE on side rail - noted skin tears LUE Sensation: decreased light touch, decreased proprioception LUE Coordination: decreased fine motor, decreased gross motor  Lower Extremity Assessment: Defer to PT evaluation LLE Deficits / Details: strength and ROM WFL LLE Sensation: decreased light touch LLE Coordination: decreased gross motor    ADLs  Overall ADL's : Needs assistance/impaired Eating/Feeding: Moderate assistance Eating/Feeding Details (indicate cue type and reason): nectar; per nursing only eating food on R side of try/plate Grooming: Moderate assistance, Sitting Upper Body Bathing: Moderate assistance, Sitting Lower Body Bathing: Maximal assistance, Sit to/from stand Upper Body Dressing : Maximal assistance, Sitting Lower Body Dressing: Maximal assistance, Bed level Functional mobility during ADLs: Moderate assistance (limited to bed)    Mobility  Overal bed mobility: Needs Assistance Bed Mobility: Supine to Sit Rolling: Mod assist Supine to sit: Mod assist Sit to supine: Mod assist General bed mobility comments: max directional verbal cues to sequence transferring LEs and trunk to the L, pt with strong L lateral lean requiring maxA to achieve midline position    Transfers  Overall transfer level: Needs assistance Equipment used: 2 person hand held assist Transfers: Sit to/from Stand Sit to Stand: Max assist, +2 physical assistance Stand pivot transfers: Max assist, +2 physical assistance General transfer comment: pt requiring max verbal and tactile cues to push up with L UE and LE, requiring blocking of L knee, pt maxAX2 to power up into standing, pt with  strong heavy lean to the L    Ambulation / Gait / Stairs / Wheelchair Mobility  Ambulation/Gait Ambulation/Gait assistance: Max assist, +2 physical assistance, +2 safety/equipment (RN to push IV, son, Merrily Pew, to push chair) Gait Distance (Feet): 20 Feet (x1, 5x1) Assistive device: 2 person hand held assist (RW for second bout) Gait Pattern/deviations: Step-to pattern, Decreased stride length, Decreased stance time - left, Decreased step length - left, Decreased dorsiflexion - left, Decreased weight shift to right, Narrow base of support General Gait Details: pt with strong L lateral lean despite max verbal and tactile cues, pt with significant fear of falling, pt with no use of L UE to support self, despite max verbal and tactile cues. Pt able to move arm however due to impaired proprioception and sensation put unable to use functionally. Pt very ataxic and requiring min/modA to advance L  LE and to block knee to advance R LE. pt with very narrow gait pattern Gait velocity: slow Gait velocity interpretation: <1.8 ft/sec, indicate of risk for recurrent falls    Posture / Balance Dynamic Sitting Balance Sitting balance - Comments: L lateral lean Balance Overall balance assessment: Needs assistance Sitting-balance support: Bilateral upper extremity supported, Feet supported Sitting balance-Leahy Scale: Poor Sitting balance - Comments: L lateral lean Postural control: Left lateral lean Standing balance support: Bilateral upper extremity supported Standing balance-Leahy Scale: Poor Standing balance comment: maxA to maintain Standing balanc,e pt with significant L lateral lean and bilat knee flexion    Special needs/care consideration Continuous Drip IV: 0.9% sodium chloride infusion, Ceftriaxone (rocephin), Oxygen : 4L [place], Skin: skin tear to left arm; hand; wound to buttocks (mid, unstageable) and Designated visitor: Larkin Ina and Merrily Pew (sons).      Previous Home Environment (from acute therapy  documentation) Living Arrangements: Children  Lives With: Son Available Help at Discharge: Family, Available 24 hours/day Type of Home: House Home Layout: Two level, Able to live on main level with bedroom/bathroom Home Access: Stairs to enter Entrance Stairs-Rails: Right, Left Entrance Stairs-Number of Steps: 3 Bathroom Shower/Tub: Optometrist: No Home Care Services: No  Discharge Living Setting Plans for Discharge Living Setting: Patient's home, House Type of Home at Discharge: House Discharge Home Layout: Two level, Able to live on main level with bedroom/bathroom Discharge Home Access: Stairs to enter Entrance Stairs-Rails: Can reach both Entrance Stairs-Number of Steps: 3 Discharge Bathroom Shower/Tub: Tub/shower unit Discharge Bathroom Toilet: Standard Discharge Bathroom Accessibility: No (States that her walker is too wide to go in the hallway ) Does the patient have any problems obtaining your medications?: No  Social/Family/Support Systems Patient Roles: Other (Comment) (Son) Contact Information: [phone] or [phone] Anticipated Caregiver: Jeryn Bertoni (son) Anticipated Caregiver's Contact Information: [phone] or [phone] Ability/Limitations of Caregiver: Min A Caregiver Availability: 24/7   Goals Patient/Family Goal for Rehab: PT/OT/SLP Min Assist Expected length of stay: 10-12 days Program Orientation Provided & Reviewed with Pt/Caregiver Including Roles  & Responsibilities: Yes   Decrease burden of Care through IP rehab admission: NA   Possible need for SNF placement upon discharge:Not anticipated; pt has great family support from her son whom she lives with who is able to provide anticipated support at DC from [facility].    Patient Condition: This patient's medical and functional status has changed since the consult dated: 08/12/19 in which the Rehabilitation Physician determined and documented  that the patient's condition is appropriate for intensive rehabilitative care in an inpatient rehabilitation facility. Medical changes are: pt now off Cleveprex.  Functional changes are: more assistance needed for transfers from Mod A to Max A +2, and progression to initiation of gait with Max A +2 for 20 feet with 2 person HHA; Pt has also been evaluated by OT since formal PM&R consult with recommendations for CIR. After evaluating the patient today and speaking with the Rehabilitation physician and acute team, the patient remains appropriate for inpatient rehab. Will admit to inpatient rehab today.  Preadmission Screen Completed By:  Raechel Ache, OT, 08/13/2019 12:07 PM ______________________________________________________________________   Discussed status with Dr. Ranell Patrick on 08/13/19 at 12:06PM and received approval for admission today.  Admission Coordinator:  Raechel Ache, time 12:06PM Sudie Grumbling 08/13/19.

## 2019-08-12 NOTE — Progress Notes (Signed)
Modified Barium Swallow Progress Note  Patient Details  Name: Caitlin Vasquez MRN: [id number] Date of Birth: 17-May-1945  Today's Date: 08/12/2019  Modified Barium Swallow completed.  Full report located under Chart Review in the Imaging Section.  Brief recommendations include the following:  Clinical Impression  Pt has a mild oropharyngeal dysphagia due to impaired coordination, timing, and oral control. She does not fully masticate solid foods, although she does clear the pieces of food that aren't chewed. There is reduced posterior transit and decreased bolus cohesion with varying amounts of mild-moderate lingual residue across different boluses. She spontaneously pushes oral residue back to her pharynx but then it stays int he valleculae without triggering a second swallow. Boluses also spill back into the pharynx as she is trying to perform her initial swallow. Impaired timing as her pyriform sinuses begin to fill with thin liquids results in aspiration with inconsistent sensation. This is not improved with cup vs straw sips or with use of chin tuck. Recommend Dys 3 diet and nectar thick liquids.    Swallow Evaluation Recommendations       SLP Diet Recommendations: Dysphagia 3 (Mech soft) solids;Nectar thick liquid   Liquid Administration via: Cup;Straw   Medication Administration: Whole meds with puree   Supervision: Staff to assist with self feeding   Compensations: Slow rate;Small sips/bites   Postural Changes: Seated upright at 90 degrees   Oral Care Recommendations: Oral care BID   Other Recommendations: Order thickener from pharmacy;Prohibited food (jello, ice cream, thin soups);Remove water pitcher    Mahala Menghini., M.A. CCC-SLP Acute Rehabilitation Services Pager [phone] Office [phone]  08/12/2019,2:54 PM

## 2019-08-12 NOTE — Progress Notes (Signed)
Physical Therapy Treatment Patient Details Name: Caitlin Vasquez MRN: [id number] DOB: September 21, 1945 Today's Date: 08/12/2019    History of Present Illness 74 y.o. female past medical history of advanced COPD, hypercholesterolemia, atrial fibrillation not on anticoagulation, presents to the emergency room as a transfer from [facility] for evaluation of cerebellar bleed. CT head demonstrates acute hemorrhage within the paramedian inferior left cerebellar hemisphere with adjacent small volume acute subarachnoid hemorrhage-likely extension of some subarachnoid bleed into the fourth ventricle.    PT Comments    Pt tolerates treatment well with improved activity tolerance. Pt now demonstrating the ability to attend to left side with verbal cues to do so, but her sensation and coordination still remain significantly affected. Pt also continues to require significant physical assistance to perform all functional mobility tasks. Pt will continue to benefit from acute PT services to improve mobility and balance quality while also reducing falls risk. Pt remains an excellent CIR candidate at this time with the potential for significant functional gains and good caregiver support.   Follow Up Recommendations  CIR     Equipment Recommendations  Wheelchair (measurements PT);Wheelchair cushion (measurements PT);Hospital bed    Recommendations for Other Services       Precautions / Restrictions Precautions Precautions: Fall Precaution Comments: SBP<140 Restrictions Weight Bearing Restrictions: No    Mobility  Bed Mobility Overal bed mobility: Needs Assistance Bed Mobility: Supine to Sit     Supine to sit: Mod assist        Transfers Overall transfer level: Needs assistance Equipment used: 1 person hand held assist Transfers: Sit to/from Stand;Stand Pivot Transfers Sit to Stand: Mod assist Stand pivot transfers: Mod assist       General transfer comment: 2 SPT and 2 sit to  stands during session to [facility] and to recliner  Ambulation/Gait                 Stairs             Wheelchair Mobility    Modified Rankin (Stroke Patients Only) Modified Rankin (Stroke Patients Only) Pre-Morbid Rankin Score: Moderate disability Modified Rankin: Severe disability     Balance Overall balance assessment: Needs assistance Sitting-balance support: Bilateral upper extremity supported;Feet supported Sitting balance-Leahy Scale: Poor Sitting balance - Comments: pt requires minA to maintain sititng balance with cues to correct L lateral lean Postural control: Left lateral lean Standing balance support: Bilateral upper extremity supported Standing balance-Leahy Scale: Poor Standing balance comment: modA to maintain static standing balance, reliant on UE support                            Cognition Arousal/Alertness: Awake/alert Behavior During Therapy: WFL for tasks assessed/performed Overall Cognitive Status: Impaired/Different from baseline Area of Impairment: Memory;Following commands;Safety/judgement;Awareness;Problem solving                   Current Attention Level: Sustained Memory: Decreased recall of precautions;Decreased short-term memory Following Commands: Follows one step commands consistently Safety/Judgement: Decreased awareness of safety;Decreased awareness of deficits Awareness: Intellectual Problem Solving: Slow processing;Requires verbal cues        Exercises      General Comments General comments (skin integrity, edema, etc.): pt desaturating to high 80s on 3L [place], PT increases supplemental oxygen to 4L during mobility with stable sats in low 90s. Pt with improved attention to L side with verbal cues but remains inattentive without cueing      Pertinent  Vitals/Pain Pain Assessment: No/denies pain    Home Living                      Prior Function            PT Goals (current goals can now be  found in the care plan section) Acute Rehab PT Goals Patient Stated Goal: To improve mobility and return to baseline Progress towards PT goals: Progressing toward goals    Frequency    Min 4X/week      PT Plan Current plan remains appropriate    Co-evaluation              AM-PAC PT "6 Clicks" Mobility   Outcome Measure  Help needed turning from your back to your side while in a flat bed without using bedrails?: None Help needed moving from lying on your back to sitting on the side of a flat bed without using bedrails?: A Lot Help needed moving to and from a bed to a chair (including a wheelchair)?: A Lot Help needed standing up from a chair using your arms (e.g., wheelchair or bedside chair)?: A Lot Help needed to walk in hospital room?: Total Help needed climbing 3-5 steps with a railing? : Total 6 Click Score: 12    End of Session Equipment Utilized During Treatment: Oxygen Activity Tolerance: Patient tolerated treatment well Patient left: in chair;with call bell/phone within reach;with chair alarm set Nurse Communication: Mobility status PT Visit Diagnosis: Unsteadiness on feet (R26.81);Other abnormalities of gait and mobility (R26.89);Ataxic gait (R26.0);Other symptoms and signs involving the nervous system (R29.898)     Time: 4854-6270 PT Time Calculation (min) (ACUTE ONLY): 25 min  Charges:  $Therapeutic Activity: 23-37 mins                     Arlyss Gandy, PT, DPT Acute Rehabilitation Pager: [phone]    Arlyss Gandy 08/12/2019, 9:07 AM

## 2019-08-12 NOTE — Progress Notes (Signed)
STROKE TEAM PROGRESS NOTE   INTERVAL HISTORY Patient is sitting up in bed.  She is doing well.  Neurologically stable.  Vital signs stable.  No changes.  Blood pressure controlled but still requiring Cleviprex drip.  Echocardiogram results are pending  OBJECTIVE Vitals:   08/12/19 0830 08/12/19 0845 08/12/19 0900 08/12/19 0915  BP: (!) 144/76 (!) 165/99 101/76 134/69  Pulse: (!) 109 (!) 113 99 (!) 102  Resp: (!) 25 (!) 24 18 17   Temp:      TempSrc:      SpO2: 91% 94% 94% 95%   CBC:  Recent Labs  Lab 08/10/19 2043 08/12/19 0736  WBC 18.2* 11.3*  HGB 14.0 12.6  HCT 42.7 33.7*  MCV 87.5 86.4  PLT 302 272   Basic Metabolic Panel:  Recent Labs  Lab 08/10/19 2043 08/12/19 0736  NA 140 134*  K 3.3* 4.0  CL 103 100  CO2 22 21*  GLUCOSE 175* 110*  BUN 13 14  CREATININE 1.08* 0.96  CALCIUM 8.5* 8.2*   Lipid Panel:     Component Value Date/Time   CHOL 260 (H) 08/10/2019 2043   TRIG 214 (H) 08/10/2019 2043   HDL 49 08/10/2019 2043   CHOLHDL 5.3 08/10/2019 2043   VLDL 43 (H) 08/10/2019 2043   LDLCALC 168 (H) 08/10/2019 2043   HgbA1c:  Lab Results  Component Value Date   HGBA1C 5.6 08/10/2019   Urine Drug Screen:     Component Value Date/Time   LABOPIA POSITIVE (A) 08/10/2019 2137   COCAINSCRNUR NONE DETECTED 08/10/2019 2137   LABBENZ NONE DETECTED 08/10/2019 2137   AMPHETMU NONE DETECTED 08/10/2019 2137   THCU NONE DETECTED 08/10/2019 2137   LABBARB NONE DETECTED 08/10/2019 2137    Alcohol Level No results found for: ETH  IMAGING past 24h CT ANGIO HEAD W OR WO CONTRAST  Result Date: 08/11/2019 CLINICAL DATA:  74 year old female code stroke presentation yesterday with acute left cerebellar hemorrhage. Ataxia. And MRI reveals superimposed acute right PCA territory infarct. Motion degraded [facility]. EXAM: CT ANGIOGRAPHY HEAD AND NECK TECHNIQUE: Multidetector CT imaging of the head and neck was performed using the standard  protocol during bolus administration of intravenous contrast. Multiplanar CT image reconstructions and MIPs were obtained to evaluate the vascular anatomy. Carotid stenosis measurements (when applicable) are obtained utilizing NASCET criteria, using the distal internal carotid diameter as the denominator. CONTRAST:  50mL OMNIPAQUE IOHEXOL 350 MG/ML SOLN COMPARISON:  Brain MRI, head CT, and CTA head and neck 08/10/2019. FINDINGS: CTA NECK Skeleton: Most of the dentition is absent. Multilevel cervical spine degeneration with some levels of posterior element ankylosis. Mild T5 compression fracture appears nonacute. No acute osseous abnormality identified. Upper chest: Centrilobular emphysema throughout the visible lungs. No superior mediastinal lymphadenopathy. Other neck: Negative. Aortic arch: Extensive Calcified aortic atherosclerosis. Including a small area of ulcerated plaque at the distal arch on series 5, image 155, stable. Three vessel arch configuration. Right carotid system: Brachiocephalic and right CCA soft and calcified plaque without stenosis. Calcified plaque at the right ICA origin and bulb without stenosis. However, there is a focal 65 % stenosis just distal to the bulb best seen on series 8, image 92 which appears related to either soft plaque or perhaps a carotid web. The right ICA remains patent to the skull base. Left carotid system: Abundant soft and calcified plaque at the proximal left CCA with less than 50% stenosis. Complex calcified plaque at the left ICA  origin, and at the distal bulb a short segment high-grade radiographic string sign stenosis occurs as seen on series 9, image 95. The left ICA remains patent to the skull base. Vertebral arteries: Proximal right subclavian artery plaque without stenosis. Dominant right vertebral artery with calcified plaque near the origin but no stenosis. Mild right V1 through V3 segment calcified plaque and tortuosity but no significant stenosis to the  skull base. Proximal left subclavian artery soft and calcified plaque without significant stenosis. Non dominant left vertebral artery with severe origin stenosis (series 8, image 125) and tandem severe stenosis in the proximal V2 segment. Additional severe left V3 segment stenosis (series 8, image 118). The vessel is diminutive and functionally terminates or is occluded at the dura. CTA HEAD Posterior circulation: Right vertebral artery supplies the basilar with V4 segment calcification resulting in up to moderate stenosis on series 7, image 138, which is proximal to the patent right PICA origin. Patent vertebrobasilar junction with probable retrograde supply to the left PICA which remains patent. Area of left cerebellar intra-axial hemorrhage on series 7, image 138. No CTA spot sign. No abnormal vascularity. Patent basilar artery without stenosis. Probable infundibulum rather than 3 mm saccular aneurysm at the left basilar tip, origin of the left SCA. Right SCA and left PCA origins are within normal limits. Posterior communicating arteries are diminutive or absent. However, just past the right PCA origin there is severe right PCA stenosis, and a diminutive appearance of the remainder of the vessel. No right PCA occlusion. Left PCA branches are mildly irregular. Anterior circulation: Both ICA siphons are patent but heavily calcified. On the left there is mild stenosis, but a superimposed 3-4 mm saccular aneurysm directed inferiorly from the distal left ICA best seen on series 9, image 84. On the right there is mild to moderate siphon stenosis in the cavernous and supraclinoid segments. Patent carotid termini. Patent MCA and ACA origins. Moderate stenosis of the right ACA A1 segment (series 11, image 19). Anterior communicating artery within normal limits. Median artery of the corpus callosum (normal variant). Mild bilateral distal ACA branch irregularity (series 12, image 16). Left MCA M1 segment and bifurcation  are patent, but there is a severe stenosis at the origin of the superior left M2 division (series 9, image 102 and series 11, image 119). However, no right MCA branch occlusion is identified. Left MCA M1 segment bifurcates early without stenosis. Right MCA branches are mildly irregular. Venous sinuses: Patent. Anatomic variants: Dominant right vertebral artery which supplies the basilar. Review of the MIP images confirms the above findings IMPRESSION: 1. Negative for CTA spot sign or abnormal vascularity in the region of the left cerebellar parenchymal hemorrhage. 2. 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 3. Positive also for severe atherosclerosis throughout the head and neck. 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 - high-grade stenosis at the Left ICA distal bulb resulting in a short segment radiographic string sign stenosis. - 65% stenosis at the Right ICA distal bulb related to either soft plaque versus carotid web. - mild to moderate bilateral ICA siphon stenosis greater on the right. - Severe stenosis of the proximal Right PCA which is diminutive but does not  occlude. - moderate stenosis of the dominant Right Vertebral Artery V4 segment which supplies the Basilar. - moderate stenosis Right ACA A1 segment. - Severe stenosis Right MCA M2 superior division origin. 4. Aortic Atherosclerosis (ICD10-I70.0) and Emphysema (ICD10-J43.9). Electronically Signed   By: Odessa Fleming M.D.   On: 08/11/2019 12:33   CT ANGIO NECK W OR WO CONTRAST  Result Date: 08/11/2019 CLINICAL DATA:  74 year old female code stroke presentation yesterday with acute  left cerebellar hemorrhage. Ataxia. And MRI reveals superimposed acute right PCA territory infarct. Motion degraded CTA head and neck at [facility] yesterday. EXAM: CT ANGIOGRAPHY HEAD AND NECK TECHNIQUE: Multidetector CT imaging of the head and neck was performed using the standard protocol during bolus administration of intravenous contrast. Multiplanar CT image reconstructions and MIPs were obtained to evaluate the vascular anatomy. Carotid stenosis measurements (when applicable) are obtained utilizing NASCET criteria, using the distal internal carotid diameter as the denominator. CONTRAST:  29mL OMNIPAQUE IOHEXOL 350 MG/ML SOLN COMPARISON:  Brain MRI, head CT, and CTA head and neck 08/10/2019. FINDINGS: CTA NECK Skeleton: Most of the dentition is absent. Multilevel cervical spine degeneration with some levels of posterior element ankylosis. Mild T5 compression fracture appears nonacute. No acute osseous abnormality identified. Upper chest: Centrilobular emphysema throughout the visible lungs. No superior mediastinal lymphadenopathy. Other neck: Negative. Aortic arch: Extensive Calcified aortic atherosclerosis. Including a small area of ulcerated plaque at the distal arch on series 5, image 155, stable. Three vessel arch configuration. Right carotid system: Brachiocephalic and right CCA soft and calcified plaque without stenosis. Calcified plaque at the right ICA origin and bulb without stenosis. However, there is a focal 65 % stenosis just distal to the bulb best seen on series 8, image 92 which appears related to either soft plaque or perhaps a carotid web. The right ICA remains patent to the skull base. Left carotid system: Abundant soft and calcified plaque at the proximal left CCA with less than 50% stenosis. Complex calcified plaque at the left ICA origin, and at the distal bulb a short segment high-grade radiographic string sign stenosis occurs as seen on series 9, image 95. The left ICA remains  patent to the skull base. Vertebral arteries: Proximal right subclavian artery plaque without stenosis. Dominant right vertebral artery with calcified plaque near the origin but no stenosis. Mild right V1 through V3 segment calcified plaque and tortuosity but no significant stenosis to the skull base. Proximal left subclavian artery soft and calcified plaque without significant stenosis. Non dominant left vertebral artery with severe origin stenosis (series 8, image 125) and tandem severe stenosis in the proximal V2 segment. Additional severe left V3 segment stenosis (series 8, image 118). The vessel is diminutive and functionally terminates or is occluded at the dura. CTA HEAD Posterior circulation: Right vertebral artery supplies the basilar with V4 segment calcification resulting in up to moderate stenosis on series 7, image 138, which is proximal to the patent right PICA origin. Patent vertebrobasilar junction with probable retrograde supply to the left PICA which remains patent. Area of left cerebellar intra-axial hemorrhage on series 7, image 138. No CTA spot sign. No abnormal vascularity. Patent basilar artery without stenosis. Probable infundibulum rather than 3 mm saccular aneurysm at the left basilar tip, origin of the left SCA. Right SCA and left PCA origins are within normal limits. Posterior communicating arteries are diminutive or absent. However, just past the right PCA origin there is severe right PCA stenosis, and a diminutive appearance of the remainder of the  vessel. No right PCA occlusion. Left PCA branches are mildly irregular. Anterior circulation: Both ICA siphons are patent but heavily calcified. On the left there is mild stenosis, but a superimposed 3-4 mm saccular aneurysm directed inferiorly from the distal left ICA best seen on series 9, image 84. On the right there is mild to moderate siphon stenosis in the cavernous and supraclinoid segments. Patent carotid termini. Patent MCA and ACA  origins. Moderate stenosis of the right ACA A1 segment (series 11, image 19). Anterior communicating artery within normal limits. Median artery of the corpus callosum (normal variant). Mild bilateral distal ACA branch irregularity (series 12, image 16). Left MCA M1 segment and bifurcation are patent, but there is a severe stenosis at the origin of the superior left M2 division (series 9, image 102 and series 11, image 119). However, no right MCA branch occlusion is identified. Left MCA M1 segment bifurcates early without stenosis. Right MCA branches are mildly irregular. Venous sinuses: Patent. Anatomic variants: Dominant right vertebral artery which supplies the basilar. Review of the MIP images confirms the above findings IMPRESSION: 1. Negative for CTA spot sign or abnormal vascularity in the region of the left cerebellar parenchymal hemorrhage. 2. 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 3. Positive also for severe atherosclerosis throughout the head and neck. 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 - high-grade stenosis at the Left ICA distal bulb resulting in a short segment radiographic string sign stenosis. - 65% stenosis at the Right ICA distal bulb related to either soft plaque versus carotid web. - mild to moderate bilateral ICA siphon stenosis greater on the right. - Severe stenosis of the proximal Right PCA which is diminutive but does not occlude. - moderate stenosis of the dominant Right Vertebral Artery V4 segment which supplies the Basilar. - moderate stenosis Right ACA A1 segment. - Severe stenosis Right MCA M2 superior  division origin. 4. Aortic Atherosclerosis (ICD10-I70.0) and Emphysema (ICD10-J43.9). Electronically Signed   By: Odessa Fleming M.D.   On: 08/11/2019 12:33     PHYSICAL EXAM Pleasant elderly lady not in distress. . Afebrile. Head is nontraumatic. Neck is supple without bruit.    Cardiac exam no murmur or gallop. Lungs are clear to auscultation. Distal pulses are well felt. Neurological Exam ; Awake alert oriented to time place and person.  Speech is normal without aphasia apraxia or dysarthria.  Extraocular movements are full range without nystagmus.  Face is symmetric without weakness.  She has dense left homonymous hemianopsia.  Tongue midline.  Motor system exam shows symmetric upper and lower extremity strength except mild weakness of left grip and intrinsic hand muscles.  Fine finger movements are diminished on the left.  Orbits right over left upper extremity  .  Gait not tested ASSESSMENT/PLAN Caitlin Vasquez is a 74 y.o. female with history of advanced COPD (home O2), hypercholesterolemia, ASPVD, non compliance with medications, atrial fibrillation not on anticoagulation, transfered from [facility] for evaluation of cerebellar bleed having initially presented there with left sided weakness / numbness.  She did not receive IV t-PA due to ICH.  Stroke: R posterior cerebral artery ischemic infarct, L cerebellar ICH w/ SAH (likely Hemorrhagic transformation  of ischemic infarct as well), Infarcts embolic d/t known AF not on AC  CT head - Redemonstrated IPH paramedian inferior left cerebellum with SAH slightly redistributed stable. Atrophy. chronic microvascular angiopathy. Remote bilateral basal ganglia lacunes.  MRI Brain W / WO - stable inferior L cerebellar ICH. 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 Infundibulum L SCA origin. Severe atherosclerosis head and neck (severe L VA, high-grade L ICA distal bulb w/ string sign, R ICA 65% stenosis soft  plaque vs web, mild to moderate R>L ICA siphon, severe proximal R PCA, moderate R V4, moderate R A1, severe R M2 superior division). Aortic atherosclerosis. Emphysema  2D Echo -pending  LDL - 168  HgbA1c - 5.6  UDS - opiates  VTE prophylaxis - SCDs  aspirin 81 mg daily (not taking plavix) prior to admission, now on No antithrombotic given hemorrhage  Therapy recommendations:  CIR  Disposition:  Pending  Transfer to floor once off BP gtt  Atrial Fibrillation  Home anticoagulation:  none   Not an AC candidate d/t hemorrhage    Hypertensive Emergency  BP as high as 214/193  Home BP meds: Coreg (not taking)  Current BP meds: Cleviprex gtt and Normodyne prn . Increase SBP goal < 180 mg Hg  . Add coreg 12.5 bid and norvasc 5 . Wean cleviprex . Long-term BP goal normotensive  Hyperlipidemia  Home Lipid lowering medication: Lipitor 20 mg daily  LDL 168, goal < 70  Current lipid lowering medication: None (statin contraindicated with ICH)  Consider statin at discharge  Other Stroke Risk Factors  Advanced age  Former cigarette smoker - quit  UDS - opiates, advised to stop using  Remote infarcts by CT  Urinary Retention  I&O cathed x 3 for ~300cc each time  Add IVF, bolus x 1  Hold foley. I&O cath again  Ambulate   Urine Cx given hesitancy and urgency pending   Rocephin 7/5>>7/8 (3)  Other Active Problems  Aortic Atherosclerosis (ICD10-I70.0)  COPD - O2 dependent at home  Hypokalemia - 3.3 -> supplement - > 4.0 - resolved  Temp 99.6 - WBCs - 18.2->11.3 - blood cultures - no growth 1 d (U/A possible UTI, UCx pending)  CKD - stage 3a - creatinine - 1.08->0.96 Hyperglycemia - 175 ->110 (HgBA1C - 5.6)  Hospital day # 2 Continue strict blood pressure control but wean Cleviprex drip and start oral blood pressure medication and use as needed IV hydralazine and labetalol.  Mobilize out of bed.  Therapy consults.  Likely transfer out of the unit  tomorrow if off Cleviprex drip and will need to go to inpatient rehab.  Family not available at the bedside for discussion.This patient is critically ill and at significant risk of neurological worsening, death and care requires constant monitoring of vital signs, hemodynamics,respiratory and cardiac monitoring, extensive review of multiple databases, frequent neurological assessment, discussion with family, other specialists and medical decision making of high complexity.I have made any additions or clarifications directly to the above note.This critical care time does not reflect procedure time, or teaching time or supervisory time of PA/NP/Med Resident etc but could involve care discussion time.  I spent 30 minutes of neurocritical care time  in the care of  this patient.   Delia Heady, MD  To contact Stroke Continuity provider, please refer to [email or web address]. After hours, contact General Neurology

## 2019-08-12 NOTE — Progress Notes (Signed)
Occupational Therapy Evaluation Patient Details Name: Caitlin Vasquez MRN: [id number] DOB: 09-Jul-1945 Today's Date: 08/12/2019    History of Present Illness 74 y.o. female past medical history of advanced COPD, hypercholesterolemia, atrial fibrillation not on anticoagulation, presents to the emergency room as a transfer from [facility] for evaluation of cerebellar bleed. CT head demonstrates acute hemorrhage within the paramedian inferior left cerebellar hemisphere with adjacent small volume acute subarachnoid hemorrhage-likely extension of some subarachnoid bleed into the fourth ventricle. MRI moderate sized acute ischemic nonhemorrhagic R PCA.    Clinical Impression   PTA, pt lived at home with her son and was modified independent with mobility and ADL @ cane level. Son assisted with IADL tasks. Pt presents with significant functional decline requiring mod A with limited mobility and Max A with ADL due to deficits listed below. In addition to L inattention and L field cut, pt with sensory motor deficits, increasing risk of injury to LUE (note skin tears). Recommend nsg place seizure pads on rails if able/keep pillows by L side to decrease risk of LUE injury. Recommend CIR for rehab. Will follow acutely.     Follow Up Recommendations  CIR;Supervision/Assistance - 24 hour    Equipment Recommendations  3 in 1 bedside commode    Recommendations for Other Services Rehab consult     Precautions / Restrictions Precautions Precautions: Fall Precaution Comments: SBP<140      Mobility Bed Mobility Overal bed mobility: Needs Assistance Bed Mobility: Rolling;Supine to Sit;Sit to Supine Rolling: Mod assist   Supine to sit: Mod assist Sit to supine: Mod assist      Transfers                 General transfer comment: not attempted this session due to fatigue    Balance Overall balance assessment: Needs assistance   Sitting balance-Leahy Scale: Poor Sitting  balance - Comments: L lateral lean                                   ADL either performed or assessed with clinical judgement   ADL Overall ADL's : Needs assistance/impaired Eating/Feeding: Moderate assistance Eating/Feeding Details (indicate cue type and reason): nectar; per nursing only eating food on R side of try/plate Grooming: Moderate assistance;Sitting   Upper Body Bathing: Moderate assistance;Sitting   Lower Body Bathing: Maximal assistance;bed level   Upper Body Dressing : Maximal assistance;Sitting   Lower Body Dressing: Maximal assistance;Bed level               Functional mobility during ADLs: Moderate assistance (limited to bed) due to fatigue       Vision Baseline Vision/History: Wears glasses Wears Glasses: Reading only Patient Visual Report: Blurring of vision Vision Assessment?: Yes Eye Alignment: Within Functional Limits Ocular Range of Motion: Impaired-to be further tested in functional context Alignment/Gaze Preference: Within Defined Limits Tracking/Visual Pursuits: Requires cues, head turns, or add eye shifts to track;Impaired - to be further tested in functional context Saccades: Additional eye shifts occurred during testing;Additional head turns occurred during testing;Decreased speed of saccadic movement;Impaired - to be further tested in functional context Visual Fields: Left visual field deficit;Impaired-to be further tested in functional context Additional Comments: unable to read large print     Perception Perception Perception Tested?: Yes Perception Deficits: Inattention/neglect Inattention/Neglect: Does not attend to left visual field Spatial deficits: L inattention; poor awareness of self in space; will further assess  Praxis Praxis Praxis tested?: Deficits Deficits: Limb apraxia    Pertinent Vitals/Pain Pain Assessment: Faces Faces Pain Scale: Hurts little more Pain Location: Head and L hand @ IV site Pain  Descriptors / Indicators: Discomfort;Grimacing Pain Intervention(s): Limited activity within patient's tolerance     Hand Dominance Right   Extremity/Trunk Assessment Upper Extremity Assessment Upper Extremity Assessment: LUE deficits/detail LUE Deficits / Details: Abnormal sensory motor function with "alien arm" movement patterns; poor coordination but attempting to use LUE - required hand over hand to grasp sheet and pull it up; poor proprioceptive input - hitting LUE on side rail - noted skin tears LUE Sensation: decreased light touch;decreased proprioception LUE Coordination: decreased fine motor;decreased gross motor   Lower Extremity Assessment Lower Extremity Assessment: Defer to PT evaluation   Cervical / Trunk Assessment Cervical / Trunk Assessment: Other exceptions (L lateral lean)   Communication Communication Communication: No difficulties   Cognition Arousal/Alertness: Awake/alert Behavior During Therapy: Impulsive Overall Cognitive Status: Impaired/Different from baseline Area of Impairment: Orientation;Attention;Memory;Following commands;Safety/judgement;Awareness;Problem solving                 Orientation Level: Disoriented to;Time Current Attention Level: Sustained Memory: Decreased recall of precautions Following Commands: Follows one step commands with increased time Safety/Judgement: Decreased awareness of safety;Decreased awareness of deficits Awareness: Emergent Problem Solving: Slow processing;Decreased initiation;Difficulty sequencing;Requires verbal cues;Requires tactile cues     General Comments  Began educating pt on increasing use of LUE, picking LUE up and moving it/incorporating into activity; Pillows placed on L side of pt to protect arm/ May benefit from seizure pads on L bed rails    Exercises     Shoulder Instructions      Home Living Family/patient expects to be discharged to:: Private residence Living Arrangements:  Children Available Help at Discharge: Family;Available 24 hours/day Type of Home: House Home Access: Stairs to enter [employer] of Steps: 3 Entrance Stairs-Rails: Right;Left Home Layout: Two level;Able to live on main level with bedroom/bathroom     Bathroom Shower/Tub: Chief Strategy Officer: Standard Bathroom Accessibility: No   Home Equipment: [employer] - single point      Lives With: Son    Prior Functioning/Environment Level of Independence: Needs assistance  Gait / Transfers Assistance Needed: pt reports ambulating independently for short household distances with use of cane ADL's / Homemaking Assistance Needed: pt reports requiring assistance for all IADLs, takes a sponge bath, performs ADLs independently            OT Problem List: Decreased strength;Decreased range of motion;Decreased activity tolerance;Impaired balance (sitting and/or standing);Impaired vision/perception;Decreased coordination;Decreased cognition;Decreased safety awareness;Decreased knowledge of use of DME or AE;Decreased knowledge of precautions;Impaired sensation;Impaired tone;Impaired UE functional use;Pain;Cardiopulmonary status limiting activity;Increased edema      OT Treatment/Interventions: Self-care/ADL training;Therapeutic exercise;Neuromuscular education;Energy conservation;DME and/or AE instruction;Therapeutic activities;Cognitive remediation/compensation;Visual/perceptual remediation/compensation;Patient/family education;Balance training    OT Goals(Current goals can be found in the care plan section) Acute Rehab OT Goals Patient Stated Goal: to get back to doing things for myself OT Goal Formulation: With patient Time For Goal Achievement: 08/26/19 Potential to Achieve Goals: Good  OT Frequency: Min 2X/week   Barriers to D/C:            Co-evaluation              AM-PAC OT "6 Clicks" Daily Activity     Outcome Measure Help from another person eating  meals?: A Lot Help from another person taking care of personal grooming?: A Lot Help  from another person toileting, which includes using toliet, bedpan, or urinal?: A Lot Help from another person bathing (including washing, rinsing, drying)?: A Lot Help from another person to put on and taking off regular upper body clothing?: A Lot Help from another person to put on and taking off regular lower body clothing?: A Lot 6 Click Score: 12   End of Session Nurse Communication: Mobility status;Other (comment) (padding L rails/pillows to reduce injury to LUE)  Activity Tolerance: Patient tolerated treatment well Patient left: in bed;with call bell/phone within reach;with bed alarm set  OT Visit Diagnosis: Unsteadiness on feet (R26.81);Other abnormalities of gait and mobility (R26.89);Muscle weakness (generalized) (M62.81);Ataxia, unspecified (R27.0);Low vision, both eyes (H54.2);Apraxia (R48.2);Other symptoms and signs involving cognitive function;Pain Pain - Right/Left: Left Pain - part of body: Arm                Time: 1450-1519 OT Time Calculation (min): 29 min Charges:  OT General Charges $OT Visit: 1 Visit OT Evaluation $OT Eval Moderate Complexity: 1 Mod OT Treatments $Self Care/Home Management : 8-22 mins  Luisa Dago, OT/L   Acute OT Clinical Specialist Acute Rehabilitation Services Pager [phone] Office [phone]   [facility] 08/12/2019, 3:54 PM

## 2019-08-13 ENCOUNTER — Inpatient Hospital Stay (HOSPITAL_COMMUNITY)
Admission: RE | Admit: 2019-08-13 | Discharge: 2019-08-14 | DRG: 056 | Disposition: A | Discharge status: Other Acute Inpatient Hospital | Payer: Medicare Other | Source: Other Acute Inpatient Hospital | Attending: Physical Medicine & Rehabilitation | Admitting: Physical Medicine & Rehabilitation

## 2019-08-13 ENCOUNTER — Encounter (HOSPITAL_COMMUNITY): Payer: Self-pay | Admitting: Physical Medicine & Rehabilitation

## 2019-08-13 ENCOUNTER — Other Ambulatory Visit: Payer: Self-pay

## 2019-08-13 DIAGNOSIS — R0902 Hypoxemia: Secondary | ICD-10-CM | POA: Diagnosis not present

## 2019-08-13 DIAGNOSIS — L89156 Pressure-induced deep tissue damage of sacral region: Secondary | ICD-10-CM | POA: Diagnosis present

## 2019-08-13 DIAGNOSIS — I110 Hypertensive heart disease with heart failure: Secondary | ICD-10-CM | POA: Diagnosis not present

## 2019-08-13 DIAGNOSIS — N390 Urinary tract infection, site not specified: Secondary | ICD-10-CM | POA: Diagnosis present

## 2019-08-13 DIAGNOSIS — E876 Hypokalemia: Secondary | ICD-10-CM | POA: Diagnosis present

## 2019-08-13 DIAGNOSIS — I5021 Acute systolic (congestive) heart failure: Secondary | ICD-10-CM | POA: Diagnosis not present

## 2019-08-13 DIAGNOSIS — R0603 Acute respiratory distress: Secondary | ICD-10-CM

## 2019-08-13 DIAGNOSIS — I4891 Unspecified atrial fibrillation: Secondary | ICD-10-CM | POA: Diagnosis present

## 2019-08-13 DIAGNOSIS — R159 Full incontinence of feces: Secondary | ICD-10-CM | POA: Diagnosis not present

## 2019-08-13 DIAGNOSIS — R4182 Altered mental status, unspecified: Secondary | ICD-10-CM | POA: Diagnosis not present

## 2019-08-13 DIAGNOSIS — Z79899 Other long term (current) drug therapy: Secondary | ICD-10-CM

## 2019-08-13 DIAGNOSIS — J449 Chronic obstructive pulmonary disease, unspecified: Secondary | ICD-10-CM | POA: Diagnosis present

## 2019-08-13 DIAGNOSIS — B9620 Unspecified Escherichia coli [E. coli] as the cause of diseases classified elsewhere: Secondary | ICD-10-CM | POA: Diagnosis present

## 2019-08-13 DIAGNOSIS — D62 Acute posthemorrhagic anemia: Secondary | ICD-10-CM | POA: Diagnosis present

## 2019-08-13 DIAGNOSIS — Z87891 Personal history of nicotine dependence: Secondary | ICD-10-CM

## 2019-08-13 DIAGNOSIS — R4781 Slurred speech: Secondary | ICD-10-CM | POA: Diagnosis not present

## 2019-08-13 DIAGNOSIS — E785 Hyperlipidemia, unspecified: Secondary | ICD-10-CM | POA: Diagnosis present

## 2019-08-13 DIAGNOSIS — I700 Atherosclerosis of aorta: Secondary | ICD-10-CM | POA: Diagnosis present

## 2019-08-13 DIAGNOSIS — Z96642 Presence of left artificial hip joint: Secondary | ICD-10-CM | POA: Diagnosis present

## 2019-08-13 DIAGNOSIS — E7800 Pure hypercholesterolemia, unspecified: Secondary | ICD-10-CM | POA: Diagnosis present

## 2019-08-13 DIAGNOSIS — R54 Age-related physical debility: Secondary | ICD-10-CM | POA: Diagnosis present

## 2019-08-13 DIAGNOSIS — R1310 Dysphagia, unspecified: Secondary | ICD-10-CM | POA: Diagnosis present

## 2019-08-13 DIAGNOSIS — R739 Hyperglycemia, unspecified: Secondary | ICD-10-CM | POA: Diagnosis present

## 2019-08-13 DIAGNOSIS — Z888 Allergy status to other drugs, medicaments and biological substances status: Secondary | ICD-10-CM

## 2019-08-13 DIAGNOSIS — I69198 Other sequelae of nontraumatic intracerebral hemorrhage: Principal | ICD-10-CM

## 2019-08-13 DIAGNOSIS — I6340 Cerebral infarction due to embolism of unspecified cerebral artery: Secondary | ICD-10-CM | POA: Diagnosis present

## 2019-08-13 DIAGNOSIS — I161 Hypertensive emergency: Secondary | ICD-10-CM | POA: Diagnosis present

## 2019-08-13 DIAGNOSIS — I614 Nontraumatic intracerebral hemorrhage in cerebellum: Secondary | ICD-10-CM | POA: Diagnosis present

## 2019-08-13 DIAGNOSIS — Z7982 Long term (current) use of aspirin: Secondary | ICD-10-CM

## 2019-08-13 DIAGNOSIS — Z8249 Family history of ischemic heart disease and other diseases of the circulatory system: Secondary | ICD-10-CM | POA: Diagnosis not present

## 2019-08-13 DIAGNOSIS — R519 Headache, unspecified: Secondary | ICD-10-CM | POA: Diagnosis present

## 2019-08-13 DIAGNOSIS — Z9981 Dependence on supplemental oxygen: Secondary | ICD-10-CM

## 2019-08-13 LAB — CBC
HCT: 33.4 % — ABNORMAL LOW (ref 36.0–46.0)
Hemoglobin: 10.6 g/dL — ABNORMAL LOW (ref 12.0–15.0)
MCH: 28.3 pg (ref 26.0–34.0)
MCHC: 31.7 g/dL (ref 30.0–36.0)
MCV: 89.3 fL (ref 80.0–100.0)
Platelets: 210 10*3/uL (ref 150–400)
RBC: 3.74 MIL/uL — ABNORMAL LOW (ref 3.87–5.11)
RDW: 13.5 % (ref 11.5–15.5)
WBC: 7.8 10*3/uL (ref 4.0–10.5)
nRBC: 0 % (ref 0.0–0.2)

## 2019-08-13 LAB — BASIC METABOLIC PANEL
Anion gap: 10 (ref 5–15)
BUN: 12 mg/dL (ref 8–23)
CO2: 24 mmol/L (ref 22–32)
Calcium: 8 mg/dL — ABNORMAL LOW (ref 8.9–10.3)
Chloride: 105 mmol/L (ref 98–111)
Creatinine, Ser: 0.98 mg/dL (ref 0.44–1.00)
GFR calc Af Amer: >60 mL/min (ref >60)
GFR calc non Af Amer: 56 mL/min — ABNORMAL LOW (ref >60)
Glucose, Bld: 103 mg/dL — ABNORMAL HIGH (ref 70–99)
Potassium: 3.4 mmol/L — ABNORMAL LOW (ref 3.5–5.1)
Sodium: 139 mmol/L (ref 135–145)

## 2019-08-13 LAB — ECHOCARDIOGRAM COMPLETE

## 2019-08-13 MED ORDER — MELATONIN 3 MG PO TABS
3.0000 mg | ORAL_TABLET | Freq: Every evening | ORAL | Status: DC | PRN
  Administered 2019-08-13: 3 mg via ORAL
  Filled 2019-08-13: qty 1

## 2019-08-13 MED ORDER — JUVEN PO PACK: 1.0000 | PACK | Freq: Two times a day (BID) | ORAL | Status: DC

## 2019-08-13 MED ORDER — ALBUTEROL SULFATE (2.5 MG/3ML) 0.083% IN NEBU: 2.5000 mg | INHALATION_SOLUTION | Freq: Four times a day (QID) | RESPIRATORY_TRACT | Status: DC | PRN

## 2019-08-13 MED ORDER — ATORVASTATIN CALCIUM 80 MG PO TABS: 80.0000 mg | ORAL_TABLET | Freq: Every day | ORAL | Status: DC

## 2019-08-13 MED ORDER — DIPHENHYDRAMINE HCL 12.5 MG/5ML PO ELIX: 12.5000 mg | ORAL_SOLUTION | Freq: Four times a day (QID) | ORAL | Status: DC | PRN

## 2019-08-13 MED ORDER — VITAL 1.0 CAL PO LIQD
237.0000 mL | Freq: Three times a day (TID) | ORAL | Status: DC
  Filled 2019-08-13 (×3): qty 237

## 2019-08-13 MED ORDER — ENOXAPARIN SODIUM 40 MG/0.4ML ~~LOC~~ SOLN: 40.0000 mg | SUBCUTANEOUS | Status: DC

## 2019-08-13 MED ORDER — AMLODIPINE BESYLATE 5 MG PO TABS: 5.0000 mg | ORAL_TABLET | Freq: Every day | ORAL | Status: DC

## 2019-08-13 MED ORDER — ENOXAPARIN SODIUM 40 MG/0.4ML ~~LOC~~ SOLN
40.0000 mg | SUBCUTANEOUS | Status: DC
  Administered 2019-08-13: 40 mg via SUBCUTANEOUS
  Filled 2019-08-13: qty 0.4

## 2019-08-13 MED ORDER — ALUM & MAG HYDROXIDE-SIMETH 200-200-20 MG/5ML PO SUSP: 30.0000 mL | ORAL | Status: DC | PRN

## 2019-08-13 MED ORDER — SENNOSIDES-DOCUSATE SODIUM 8.6-50 MG PO TABS: 1.0000 | ORAL_TABLET | Freq: Two times a day (BID) | ORAL | Status: DC

## 2019-08-13 MED ORDER — PROCHLORPERAZINE 25 MG RE SUPP: 12.5000 mg | Freq: Four times a day (QID) | RECTAL | Status: DC | PRN

## 2019-08-13 MED ORDER — POTASSIUM CHLORIDE CRYS ER 20 MEQ PO TBCR
20.0000 meq | EXTENDED_RELEASE_TABLET | Freq: Two times a day (BID) | ORAL | Status: DC
  Administered 2019-08-13: 20 meq via ORAL
  Filled 2019-08-13: qty 1

## 2019-08-13 MED ORDER — CARVEDILOL 12.5 MG PO TABS
12.5000 mg | ORAL_TABLET | Freq: Two times a day (BID) | ORAL | Status: DC
  Administered 2019-08-13: 12.5 mg via ORAL
  Filled 2019-08-13: qty 1

## 2019-08-13 MED ORDER — SODIUM CHLORIDE 0.9 % IV SOLN: 50.0000 mL | INTRAVENOUS | 0 refills | Status: DC

## 2019-08-13 MED ORDER — CEPHALEXIN 250 MG PO CAPS: 500.0000 mg | ORAL_CAPSULE | Freq: Three times a day (TID) | ORAL | Status: DC

## 2019-08-13 MED ORDER — GUAIFENESIN-DM 100-10 MG/5ML PO SYRP: 5.0000 mL | ORAL_SOLUTION | Freq: Four times a day (QID) | ORAL | Status: DC | PRN

## 2019-08-13 MED ORDER — PROCHLORPERAZINE EDISYLATE 10 MG/2ML IJ SOLN: 5.0000 mg | Freq: Four times a day (QID) | INTRAMUSCULAR | Status: DC | PRN

## 2019-08-13 MED ORDER — BISACODYL 10 MG RE SUPP: 10.0000 mg | Freq: Every day | RECTAL | Status: DC | PRN

## 2019-08-13 MED ORDER — PROCHLORPERAZINE MALEATE 5 MG PO TABS: 5.0000 mg | ORAL_TABLET | Freq: Four times a day (QID) | ORAL | Status: DC | PRN

## 2019-08-13 MED ORDER — ACETAMINOPHEN 325 MG PO TABS: 325.0000 mg | ORAL_TABLET | ORAL | Status: DC | PRN

## 2019-08-13 MED ORDER — MUSCLE RUB 10-15 % EX CREA
TOPICAL_CREAM | Freq: Three times a day (TID) | CUTANEOUS | Status: DC
  Filled 2019-08-13: qty 85

## 2019-08-13 MED ORDER — HYDRALAZINE HCL 10 MG PO TABS: 10.0000 mg | ORAL_TABLET | Freq: Four times a day (QID) | ORAL | Status: DC | PRN

## 2019-08-13 MED ORDER — ASPIRIN 81 MG PO TBEC: 81.0000 mg | DELAYED_RELEASE_TABLET | Freq: Every day | ORAL | 3 refills | Status: DC

## 2019-08-13 MED ORDER — HYDROCODONE-ACETAMINOPHEN 5-325 MG PO TABS
1.0000 | ORAL_TABLET | Freq: Once | ORAL | Status: AC
  Administered 2019-08-13: 1 via ORAL
  Filled 2019-08-13: qty 1

## 2019-08-13 MED ORDER — SODIUM CHLORIDE 0.9 % IV SOLN: 1.0000 g | INTRAVENOUS | Status: DC

## 2019-08-13 MED ORDER — PANTOPRAZOLE SODIUM 40 MG PO TBEC
40.0000 mg | DELAYED_RELEASE_TABLET | Freq: Every day | ORAL | Status: DC
  Administered 2019-08-13: 40 mg via ORAL
  Filled 2019-08-13: qty 1

## 2019-08-13 MED ORDER — RESOURCE THICKENUP CLEAR PO POWD: 1.0000 | ORAL | Status: DC | PRN

## 2019-08-13 MED ORDER — SENNOSIDES-DOCUSATE SODIUM 8.6-50 MG PO TABS
1.0000 | ORAL_TABLET | Freq: Every day | ORAL | Status: DC
  Administered 2019-08-13: 1 via ORAL
  Filled 2019-08-13: qty 1

## 2019-08-13 MED ORDER — POTASSIUM CHLORIDE CRYS ER 20 MEQ PO TBCR
40.0000 meq | EXTENDED_RELEASE_TABLET | Freq: Once | ORAL | Status: AC
  Administered 2019-08-13: 40 meq via ORAL
  Filled 2019-08-13: qty 2

## 2019-08-13 MED ORDER — CEPHALEXIN 250 MG PO CAPS
500.0000 mg | ORAL_CAPSULE | Freq: Three times a day (TID) | ORAL | Status: DC
  Filled 2019-08-13: qty 2

## 2019-08-13 MED ORDER — FLEET ENEMA 7-19 GM/118ML RE ENEM: 1.0000 | ENEMA | Freq: Once | RECTAL | Status: DC | PRN

## 2019-08-13 MED ORDER — RESOURCE THICKENUP CLEAR PO POWD
ORAL | Status: DC | PRN
  Filled 2019-08-13: qty 125

## 2019-08-13 NOTE — Progress Notes (Signed)
Inpatient Rehabilitation Medication Review by a Pharmacist  A complete drug regimen review was completed for this patient to identify any potential clinically significant medication issues.  Clinically significant medication issues were identified:  yes   Type of Medication Issue Identified Description of Issue Urgent (address now) Non-Urgent (address on AM team rounds) Plan Plan Accepted by Provider? (Yes / No / Pending AM Rounds)  Additional Drug Therapy Needed  Neuro recommends restarting ASA 81mg  on 7/11. Non-Urgent Rx to follow up with MD prior to 7/11.   Other  Neuro also recommends evaluating patient for start of Eliquis prior to discharge.  See D/C summary 7/6. Non-Urgent      Name of provider notified for urgent issues identified: N/A  Provider Method of Notification:    For non-urgent medication issues to be resolved on team rounds tomorrow morning a [facility] Secure Chat Handoff was sent to:    Pharmacist comments:   Time spent performing this drug regimen review (minutes):  15   Caitlin Vasquez, Pharm.D., BCPS Clinical Pharmacist  **Pharmacist phone directory can be found on [email or web address] listed under [facility].  08/13/2019 5:53 PM

## 2019-08-13 NOTE — Progress Notes (Signed)
Caitlin Chin, MD  Physician  Physical Medicine and Rehabilitation  Consult Note      Signed  Date of Service:  08/12/2019  8:16 AM      Related encounter: ED to Hosp-Admission (Current) from 08/10/2019 in [facility] NEURO/TRAUMA/SURGICAL ICU      Signed      Expand All Collapse All           Physical Medicine and Rehabilitation Consult     Reason for Consult: Cerebellar IPH with functional deficits.  Referring Physician: Dr. Pearlean Brownie.      HPI: Caitlin Vasquez is a 74 y.o. 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 MRI brain showed no change in small IPH left cerebellum with surrounding mass effect, small amount SAH and moderate ischemic infarct R-PCA with moderately advanced cerebral atrophy. Dr. Marcia Brash consulted for input and felt that no surgical intervention needed and recommended MRI w/wo contrast when able to rule out underlying mass. She was started on Cleveprex for BP control and 2 D echo done for work up.  She has had urinary retention requiring I/O caths. Therapy evaluations completed revealing deficits in attention and memory, significant left inattention with ataxia affecting functional status. CIR was recommended due to functional deficits. 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      Review of Systems  Constitutional: Negative for chills and fever.  HENT: Negative for hearing loss and tinnitus.   Eyes: Negative for blurred vision and double vision.  Respiratory: Negative for cough and  shortness of breath.   Cardiovascular: Negative for chest pain and palpitations.       Leg cramps that keep her up at nights  Gastrointestinal: Positive for nausea. Negative for constipation, heartburn and vomiting.  Genitourinary: Negative for dysuria and urgency.  Musculoskeletal: Positive for back pain (since admission.). Negative for falls, joint pain and myalgias.  Skin: Negative for rash.  Neurological: Positive for dizziness and weakness. Negative for headaches.  Psychiatric/Behavioral: The patient is not nervous/anxious and does not have insomnia.             Past Medical History:  Diagnosis Date  . COPD (chronic obstructive pulmonary disease) (HCC)    . High cholesterol    . On home oxygen therapy      "2L; all the time" (04/06/2016)  . Pneumonia      "when I was a kid"   . Urinary hesitancy             Past Surgical History:  Procedure Laterality Date  . ABDOMINAL AORTOGRAM N/A 04/08/2016    Procedure: Abdominal Aortogram;  Surgeon: Sherren Kerns, MD;  Location: [facility] INVASIVE CV LAB;  Service: Cardiovascular;  Laterality: N/A;  . CARDIAC CATHETERIZATION N/A 11/28/2014    Procedure: Right/Left Heart Cath and Coronary Angiography;  Surgeon: [name], MD;  Location: [facility] INVASIVE CV LAB;  Service: Cardiovascular;  Laterality: N/A;  . CARDIAC CATHETERIZATION   08/2006    Hattie Perch 08/25/2006  . DRESSING CHANGE UNDER ANESTHESIA Left 04/12/2016    Procedure: DRESSING CHANGE LEFT HIP AND LEFT HEEL;  Surgeon: Fransisco Hertz, MD;  Location: [facility] OR;  Service: Vascular;  Laterality: Left;  . FEMORAL-POPLITEAL BYPASS GRAFT Left 04/12/2016    Procedure: LEFT FEMORAL-POPITEAL  BYPASS GRAFT;  Surgeon: Fransisco Hertz, MD;  Location: [facility] OR;  Service: Vascular;  Laterality: Left;  . JOINT REPLACEMENT      . LOWER EXTREMITY ANGIOGRAPHY Bilateral 04/08/2016    Procedure: Lower Extremity Angiography;  Surgeon: Sherren Kerns, MD;  Location: [facility] INVASIVE CV LAB;  Service: Cardiovascular;  Laterality:  Bilateral;  . PERIPHERAL VASCULAR INTERVENTION Bilateral 04/08/2016    Procedure: Peripheral Vascular Intervention;  Surgeon: Sherren Kerns, MD;  Location: [facility] INVASIVE CV LAB;  Service: Cardiovascular;  Laterality: Bilateral;  common iliac  . TOTAL HIP ARTHROPLASTY Left 01/29/2016  . VEIN HARVEST Left 04/12/2016    Procedure: LEFT GREATER SAPHENOUS VEIN HARVEST;  Surgeon: Fransisco Hertz, MD;  Location: [facility] OR;  Service: Vascular;  Laterality: Left;           Family History  Problem Relation Age of Onset  . Heart attack Father        Social History:  Lives with son (who manges home/cooks-does not work).  Sedentary with limited mobility--lives in her recliner. Uses cane for ambulation. She reports that she quit smoking about 5 years ago. Her smoking use included cigarettes. She has a 55.00 pack-year smoking history. She uses E- cigarettes daily. She reports that she does not drink alcohol and does not use drugs.     Allergies  Allergen Reactions  . Clonidine Derivatives Other (See Comments)      hypotension            Medications Prior to Admission  Medication Sig Dispense Refill  . acetaminophen (TYLENOL) 500 MG tablet Take 500 mg by mouth 2 (two) times daily as needed for mild pain.      Marland Kitchen albuterol (PROVENTIL HFA;VENTOLIN HFA) 108 (90 BASE) MCG/ACT inhaler Inhale 2 puffs into the lungs every 6 (six) hours as needed for wheezing or shortness of breath.      Marland Kitchen aspirin EC 81 MG EC tablet Take 1 tablet (81 mg total) by mouth daily. 30 tablet 3  . atorvastatin (LIPITOR) 20 MG tablet Take 1 tablet (20 mg total) by mouth 3 (three) times a week. (Patient not taking: Reported on 08/10/2019) 30 tablet 3  . carvedilol (COREG) 12.5 MG tablet Take 1 tablet (12.5 mg total) by mouth 2 (two) times daily with a meal. (Patient not taking: Reported on 08/10/2019) 60 tablet 3  . clopidogrel (PLAVIX) 75 MG tablet Take 1 tablet (75 mg total) by mouth daily. (Patient not taking: Reported on 08/10/2019) 30 tablet 3    . collagenase (SANTYL) ointment Apply topically daily. Apply to left heel daily and cover with dry gauze. Use gauze wrap on top to keep in place. (Patient not taking: Reported on 08/10/2019) 15 g 2  . docusate sodium (COLACE) 100 MG capsule Take 1 capsule (100 mg total) by mouth daily. (Patient not taking: Reported on 08/10/2019) 30 capsule 3  . feeding supplement, ENSURE ENLIVE, (ENSURE ENLIVE) LIQD Take 237 mLs by mouth 2 (two) times daily between meals. (Patient not taking: Reported on 08/10/2019) 60 Bottle 12  . ferrous sulfate 325 (65 FE) MG tablet Take 1 tablet (325 mg total) by mouth 2 (two) times daily with a meal. (Patient not taking: Reported on 08/10/2019) 60 tablet 3  . mupirocin ointment (BACTROBAN) 2 % Place 1 application into the nose 2 (two) times  daily. (Patient not taking: Reported on 08/10/2019) 22 g 0  . pantoprazole (PROTONIX) 40 MG tablet Take 1 tablet (40 mg total) by mouth daily. (Patient not taking: Reported on 08/10/2019) 30 tablet 3  . potassium chloride SA (K-DUR,KLOR-CON) 10 MEQ tablet Take 1 tablet (10 mEq total) by mouth daily. (Patient not taking: Reported on 08/10/2019) 30 tablet 3      Home: Home Living Family/patient expects to be discharged to:: Private residence Living Arrangements: Children Available Help at Discharge: Family, Available 24 hours/day Type of Home: House Home Access: Stairs to enter [employer] of Steps: 3 Entrance Stairs-Rails: Right, Left Home Layout: Two level, Able to live on main level with bedroom/bathroom Bathroom Shower/Tub:  (pt performs bird baths) Bathroom Toilet: Standard Bathroom Accessibility: No Home Equipment: Gilmer Mor - single point  Lives With: Son  Functional History: Prior Function Level of Independence: Needs assistance Gait / Transfers Assistance Needed: pt reports ambulating independently for short household distances with use of cane ADL's / Homemaking Assistance Needed: pt reports requiring assistance for all IADLs,  takes a sponge bath, performs ADLs independently Functional Status:  Mobility: Bed Mobility Overal bed mobility: Needs Assistance Bed Mobility: Supine to Sit Supine to sit: Mod assist General bed mobility comments: modA to scoot forward to edge, able to independently elevate trunk up from bed Transfers Overall transfer level: Needs assistance Equipment used: 1 person hand held assist Transfers: Sit to/from Stand, Stand Pivot Transfers Sit to Stand: Mod assist Stand pivot transfers: Mod assist General transfer comment: pt with ataxic motion of LLE, requires PT physical assistance to mobilize and facilitate turning with LLE   ADL:   Cognition: Cognition Overall Cognitive Status: Impaired/Different from baseline Arousal/Alertness: Awake/alert Orientation Level: Oriented X4 Attention: Sustained Sustained Attention: Impaired Sustained Attention Impairment: Verbal basic Memory: Impaired Memory Impairment: Decreased recall of new information Awareness: Impaired Awareness Impairment: Intellectual impairment Problem Solving: Appears intact Behaviors: Restless Cognition Arousal/Alertness: Awake/alert Behavior During Therapy: WFL for tasks assessed/performed Overall Cognitive Status: Impaired/Different from baseline Area of Impairment: Attention, Memory, Following commands, Safety/judgement, Awareness, Problem solving Current Attention Level: Sustained Memory: Decreased recall of precautions, Decreased short-term memory Following Commands: Follows one step commands with increased time Safety/Judgement: Decreased awareness of safety, Decreased awareness of deficits Awareness: Intellectual Problem Solving: Slow processing, Requires verbal cues     Blood pressure (!) 144/58, pulse 82, temperature 99.2 F (37.3 C), temperature source Oral, resp. rate 13, SpO2 94 %.   Physical Exam   General: Working with PT. On commode.  HEENT: Head is normocephalic, atraumatic, PERRLA, EOMI,  sclera anicteric, oral mucosa pink and moist, dentition intact, ext ear canals clear,  Neck: Supple without JVD or lymphadenopathy Heart: Reg rate and rhythm. No murmurs rubs or gallops Chest: Wheezing (occasional auditory sounds) present.  Abdomen: Soft, non-tender, non-distended, bowel sounds positive. Extremities: No clubbing, cyanosis, or edema. Pulses are 2+ Skin: Skin is dry. IV in place. Onychomycotic toenails. Dry flaky skin bilateral feet.   Neuro: Mental Status: She is oriented to person, place, and time.     Comments: Left inattention with tendency to slump to the left. Left visual field deficits but able to scan to the left with cues. Left sided weakness with ataxia.  4/5 strength LUE, strength is otherwise intact. Difficulty with finger-to-nose on left Psych: pleasant affect, cooperative   Lab Results Last 24 Hours  No results found for this or any previous visit (from the past 24 hour(s)).    Imaging Results (Last 48 hours)  CT  ANGIO HEAD W OR WO CONTRAST   Result Date: 08/11/2019 CLINICAL DATA:  74 year old female code stroke presentation yesterday with acute left cerebellar hemorrhage. Ataxia. And MRI reveals superimposed acute right PCA territory infarct. Motion degraded CTA head and neck at [facility] yesterday. EXAM: CT ANGIOGRAPHY HEAD AND NECK TECHNIQUE: Multidetector CT imaging of the head and neck was performed using the standard protocol during bolus administration of intravenous contrast. Multiplanar CT image reconstructions and MIPs were obtained to evaluate the vascular anatomy. Carotid stenosis measurements (when applicable) are obtained utilizing NASCET criteria, using the distal internal carotid diameter as the denominator. CONTRAST:  50mL OMNIPAQUE IOHEXOL 350 MG/ML SOLN COMPARISON:  Brain MRI, head CT, and CTA head and neck 08/10/2019. FINDINGS: CTA NECK Skeleton: Most of the dentition is absent. Multilevel cervical spine degeneration with some levels of  posterior element ankylosis. Mild T5 compression fracture appears nonacute. No acute osseous abnormality identified. Upper chest: Centrilobular emphysema throughout the visible lungs. No superior mediastinal lymphadenopathy. Other neck: Negative. Aortic arch: Extensive Calcified aortic atherosclerosis. Including a small area of ulcerated plaque at the distal arch on series 5, image 155, stable. Three vessel arch configuration. Right carotid system: Brachiocephalic and right CCA soft and calcified plaque without stenosis. Calcified plaque at the right ICA origin and bulb without stenosis. However, there is a focal 65 % stenosis just distal to the bulb best seen on series 8, image 92 which appears related to either soft plaque or perhaps a carotid web. The right ICA remains patent to the skull base. Left carotid system: Abundant soft and calcified plaque at the proximal left CCA with less than 50% stenosis. Complex calcified plaque at the left ICA origin, and at the distal bulb a short segment high-grade radiographic string sign stenosis occurs as seen on series 9, image 95. The left ICA remains patent to the skull base. Vertebral arteries: Proximal right subclavian artery plaque without stenosis. Dominant right vertebral artery with calcified plaque near the origin but no stenosis. Mild right V1 through V3 segment calcified plaque and tortuosity but no significant stenosis to the skull base. Proximal left subclavian artery soft and calcified plaque without significant stenosis. Non dominant left vertebral artery with severe origin stenosis (series 8, image 125) and tandem severe stenosis in the proximal V2 segment. Additional severe left V3 segment stenosis (series 8, image 118). The vessel is diminutive and functionally terminates or is occluded at the dura. CTA HEAD Posterior circulation: Right vertebral artery supplies the basilar with V4 segment calcification resulting in up to moderate stenosis on series 7,  image 138, which is proximal to the patent right PICA origin. Patent vertebrobasilar junction with probable retrograde supply to the left PICA which remains patent. Area of left cerebellar intra-axial hemorrhage on series 7, image 138. No CTA spot sign. No abnormal vascularity. Patent basilar artery without stenosis. Probable infundibulum rather than 3 mm saccular aneurysm at the left basilar tip, origin of the left SCA. Right SCA and left PCA origins are within normal limits. Posterior communicating arteries are diminutive or absent. However, just past the right PCA origin there is severe right PCA stenosis, and a diminutive appearance of the remainder of the vessel. No right PCA occlusion. Left PCA branches are mildly irregular. Anterior circulation: Both ICA siphons are patent but heavily calcified. On the left there is mild stenosis, but a superimposed 3-4 mm saccular aneurysm directed inferiorly from the distal left ICA best seen on series 9, image 84. On the right there is mild  to moderate siphon stenosis in the cavernous and supraclinoid segments. Patent carotid termini. Patent MCA and ACA origins. Moderate stenosis of the right ACA A1 segment (series 11, image 19). Anterior communicating artery within normal limits. Median artery of the corpus callosum (normal variant). Mild bilateral distal ACA branch irregularity (series 12, image 16). Left MCA M1 segment and bifurcation are patent, but there is a severe stenosis at the origin of the superior left M2 division (series 9, image 102 and series 11, image 119). However, no right MCA branch occlusion is identified. Left MCA M1 segment bifurcates early without stenosis. Right MCA branches are mildly irregular. Venous sinuses: Patent. Anatomic variants: Dominant right vertebral artery which supplies the basilar. Review of the MIP images confirms the above findings IMPRESSION: 1. Negative for CTA spot sign or abnormal vascularity in the region of the left  cerebellar parenchymal hemorrhage. 2. 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 3. Positive also for severe atherosclerosis throughout the head and neck. 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 - high-grade stenosis at the Left ICA distal bulb resulting in a short segment radiographic string sign stenosis. - 65% stenosis at the Right ICA distal bulb related to either soft plaque versus carotid web. - mild to moderate bilateral ICA siphon stenosis greater on the right. - Severe stenosis of the proximal Right PCA which is diminutive but does not occlude. - moderate stenosis of the dominant Right Vertebral Artery V4 segment which supplies the Basilar. - moderate stenosis Right ACA A1 segment. - Severe stenosis Right MCA M2 superior division origin. 4. Aortic Atherosclerosis (ICD10-I70.0) and Emphysema (ICD10-J43.9). Electronically Signed   By: Odessa Fleming M.D.   On: 08/11/2019 12:33    CT Head Wo Contrast   Result Date: 08/10/2019 CLINICAL DATA:  Ataxia, stroke suspected EXAM: CT HEAD WITHOUT CONTRAST TECHNIQUE: Contiguous axial images were obtained from the base of the skull through the vertex without intravenous contrast. COMPARISON:  CT and CT angiography 08/10/2019 FINDINGS: Brain: Redemonstration of the parenchymal hemorrhage within the paramedian inferior left cerebellar hemisphere with associated adjacent subarachnoid hemorrhage into the fourth ventricle and extending laterally into the basal cisterns. Overall extent of this hemorrhage is not significantly changed from the comparison CT. No new areas of hyperdense hemorrhage are seen. No  significant resulting mass effect. No CT evidence of a large acute vascular territory or cortically based infarct. Several stable hypoattenuating foci in the bilateral basal ganglia likely reflect sequela of remote lacunar infarcts. Benign senescent mineralization of the basal ganglia, similar to prior. Symmetric prominence of the ventricles, cisterns and sulci compatible with parenchymal volume loss. Patchy areas of white matter hypoattenuation are most compatible with chronic microvascular angiopathy. Vascular: Extensive calcification within the carotid siphons and right intradural vertebral artery. Skull: No calvarial fracture or suspicious osseous lesion. No scalp swelling or hematoma. Sinuses/Orbits: Paranasal sinuses and mastoid air cells are predominantly clear. Included orbital structures are unremarkable. Other: Absence of the maxillary dentition with the mandibular bridge. IMPRESSION: 1. Redemonstrated parenchymal hemorrhage within the paramedian inferior left cerebellum with associated subarachnoid hemorrhage slightly redistributed from prior without significant interval increase in the size of the hemorrhage or new areas of hemorrhage identified. 2. Stable parenchymal  volume loss and chronic microvascular angiopathy. 3. Remote lacunar infarcts in the bilateral basal ganglia. Electronically Signed   By: Kreg Shropshire M.D.   On: 08/10/2019 20:08    CT ANGIO NECK W OR WO CONTRAST   Result Date: 08/11/2019 CLINICAL DATA:  74 year old female code stroke presentation yesterday with acute left cerebellar hemorrhage. Ataxia. And MRI reveals superimposed acute right PCA territory infarct. Motion degraded CTA head and neck at [facility] yesterday. EXAM: CT ANGIOGRAPHY HEAD AND NECK TECHNIQUE: Multidetector CT imaging of the head and neck was performed using the standard protocol during bolus administration of intravenous contrast. Multiplanar CT image reconstructions and MIPs were obtained to evaluate the  vascular anatomy. Carotid stenosis measurements (when applicable) are obtained utilizing NASCET criteria, using the distal internal carotid diameter as the denominator. CONTRAST:  22mL OMNIPAQUE IOHEXOL 350 MG/ML SOLN COMPARISON:  Brain MRI, head CT, and CTA head and neck 08/10/2019. FINDINGS: CTA NECK Skeleton: Most of the dentition is absent. Multilevel cervical spine degeneration with some levels of posterior element ankylosis. Mild T5 compression fracture appears nonacute. No acute osseous abnormality identified. Upper chest: Centrilobular emphysema throughout the visible lungs. No superior mediastinal lymphadenopathy. Other neck: Negative. Aortic arch: Extensive Calcified aortic atherosclerosis. Including a small area of ulcerated plaque at the distal arch on series 5, image 155, stable. Three vessel arch configuration. Right carotid system: Brachiocephalic and right CCA soft and calcified plaque without stenosis. Calcified plaque at the right ICA origin and bulb without stenosis. However, there is a focal 65 % stenosis just distal to the bulb best seen on series 8, image 92 which appears related to either soft plaque or perhaps a carotid web. The right ICA remains patent to the skull base. Left carotid system: Abundant soft and calcified plaque at the proximal left CCA with less than 50% stenosis. Complex calcified plaque at the left ICA origin, and at the distal bulb a short segment high-grade radiographic string sign stenosis occurs as seen on series 9, image 95. The left ICA remains patent to the skull base. Vertebral arteries: Proximal right subclavian artery plaque without stenosis. Dominant right vertebral artery with calcified plaque near the origin but no stenosis. Mild right V1 through V3 segment calcified plaque and tortuosity but no significant stenosis to the skull base. Proximal left subclavian artery soft and calcified plaque without significant stenosis. Non dominant left vertebral artery with  severe origin stenosis (series 8, image 125) and tandem severe stenosis in the proximal V2 segment. Additional severe left V3 segment stenosis (series 8, image 118). The vessel is diminutive and functionally terminates or is occluded at the dura. CTA HEAD Posterior circulation: Right vertebral artery supplies the basilar with V4 segment calcification resulting in up to moderate stenosis on series 7, image 138, which is proximal to the patent right PICA origin. Patent vertebrobasilar junction with probable retrograde supply to the left PICA which remains patent. Area of left cerebellar intra-axial hemorrhage on series 7, image 138. No CTA spot sign. No abnormal vascularity. Patent basilar artery without stenosis. Probable infundibulum rather than 3 mm saccular aneurysm at the left basilar tip, origin of the left SCA. Right SCA and left PCA origins are within normal limits. Posterior communicating arteries are diminutive or absent. However, just past the right PCA origin there is severe right PCA stenosis, and a diminutive appearance of the remainder of the vessel. No right PCA occlusion. Left PCA branches are mildly irregular. Anterior circulation: Both ICA siphons are patent but heavily calcified. On  the left there is mild stenosis, but a superimposed 3-4 mm saccular aneurysm directed inferiorly from the distal left ICA best seen on series 9, image 84. On the right there is mild to moderate siphon stenosis in the cavernous and supraclinoid segments. Patent carotid termini. Patent MCA and ACA origins. Moderate stenosis of the right ACA A1 segment (series 11, image 19). Anterior communicating artery within normal limits. Median artery of the corpus callosum (normal variant). Mild bilateral distal ACA branch irregularity (series 12, image 16). Left MCA M1 segment and bifurcation are patent, but there is a severe stenosis at the origin of the superior left M2 division (series 9, image 102 and series 11, image 119).  However, no right MCA branch occlusion is identified. Left MCA M1 segment bifurcates early without stenosis. Right MCA branches are mildly irregular. Venous sinuses: Patent. Anatomic variants: Dominant right vertebral artery which supplies the basilar. Review of the MIP images confirms the above findings IMPRESSION: 1. Negative for CTA spot sign or abnormal vascularity in the region of the left cerebellar parenchymal hemorrhage. 2. 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 3. Positive also for severe atherosclerosis throughout the head and neck. 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 - high-grade stenosis at the Left ICA distal bulb resulting in a short segment radiographic string sign stenosis. - 65% stenosis at the Right ICA distal bulb related to either soft plaque versus carotid web. - mild to moderate bilateral ICA siphon stenosis greater on the right. - Severe stenosis of the proximal Right PCA which is diminutive but does not occlude. - moderate stenosis of the dominant Right Vertebral Artery V4 segment which supplies the Basilar. - moderate stenosis Right ACA A1 segment. - Severe stenosis Right MCA M2 superior division origin. 4. Aortic Atherosclerosis (ICD10-I70.0) and Emphysema (ICD10-J43.9). Electronically Signed   By: Odessa FlemingH  Hall M.D.   On: 08/11/2019 12:33    MR BRAIN W WO CONTRAST   Result Date: 08/11/2019 CLINICAL DATA:  Follow-up examination for acute intracranial hemorrhage, stroke. EXAM: MRI HEAD WITHOUT AND WITH CONTRAST TECHNIQUE: Multiplanar, multiecho pulse sequences of the brain and surrounding structures were obtained  without and with intravenous contrast. CONTRAST:  5.415mL GADAVIST GADOBUTROL 1 MMOL/ML IV SOLN COMPARISON:  Prior CTs from earlier the same day. FINDINGS: Brain: Examination moderately to severely degraded by motion artifact. Diffuse prominence of the CSF containing spaces compatible with moderately advanced age-related cerebral atrophy. Patchy and confluent T2/FLAIR hyperintensity within the periventricular deep white matter both cerebral hemispheres most consistent with chronic small vessel ischemic disease, moderate nature. Scattered superimposed remote lacunar infarcts present about the bilateral basal ganglia and thalami. Patchy and confluent areas of restricted diffusion involving the parasagittal right temporal occipital region including the right hippocampal formation, consistent with an acute right PCA territory infarct. Patchy involvement of the right thalamus noted. No associated hemorrhage or mass effect. Previously identified parenchymal hemorrhage involving the inferior left cerebellum again seen, not significantly changed in size measuring 1.4 x 1.2 x 1.5 cm (series 10, image 5). Mild surrounding vasogenic edema without significant regional mass effect. Associated small volume subarachnoid hemorrhage within the adjacent posterior fossa, better appreciated on prior CT. No appreciable underlying mass lesion, vascular  abnormality, or abnormal enhancement, although evaluation degraded by motion. No other evidence for acute or subacute infarct. Gray-white matter differentiation otherwise maintained. No encephalomalacia to suggest chronic cortical infarction elsewhere within the brain. No other evidence for acute or chronic intracranial hemorrhage. No mass lesion, midline shift or mass effect. No hydrocephalus or extra-axial fluid collection. Pituitary gland grossly within normal limits. Midline structures intact. No other abnormal enhancement. Vascular: Major intracranial vascular flow voids are maintained.  Skull and upper cervical spine: Craniocervical junction grossly within normal limits. Bone marrow signal intensity within normal limits. No scalp soft tissue abnormality. Sinuses/Orbits: Globes and orbital soft tissues within normal limits. Paranasal sinuses are largely clear. No mastoid effusion. Inner ear structures grossly normal. Other: None. IMPRESSION: 1. Technically limited exam due to extensive motion artifact. 2. No significant interval change in size of small intraparenchymal hemorrhage involving the inferior left cerebellum. Surrounding mild vasogenic edema without significant regional mass effect. Associated small volume subarachnoid hemorrhage better evaluated on prior CT. No appreciable underlying mass lesion or other abnormality identified. 3. 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 4. Underlying moderately advanced cerebral atrophy with chronic microvascular ischemic disease, with multiple remote lacunar infarcts about the deep gray nuclei bilaterally. Electronically Signed   By: Rise Mu M.D.   On: 08/11/2019 00:30    DG Chest Port 1 View   Result Date: 08/10/2019 CLINICAL DATA:  COPD. EXAM: PORTABLE CHEST 1 VIEW COMPARISON:  Radiograph earlier this day at [facility] FINDINGS: The cardiomediastinal contours are normal. Aortic atherosclerosis. Chronic interstitial coarsening and emphysema. Pulmonary vasculature is normal. No consolidation, pleural effusion, or pneumothorax. No acute osseous abnormalities are seen. IMPRESSION: 1. No acute abnormality. 2. Chronic interstitial coarsening and emphysema. Aortic Atherosclerosis (ICD10-I70.0). Electronically Signed   By: Narda Rutherford M.D.   On: 08/10/2019 23:31         Assessment/Plan: Diagnosis: Cerebellar hemorrhage 1. Does the need for close, 24 hr/day medical supervision in concert with the patient's rehab needs make it unreasonable for this patient to be  served in a less intensive setting? Yes 2. 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 Due to bladder management, bowel management, safety, skin/wound care, disease management, medication administration, pain management and patient education, does the patient require 24 hr/day rehab nursing? Yes 4. Does the patient require coordinated care of a physician, rehab nurse, therapy disciplines of PT, OT, SLP to address physical and functional deficits in the context of the above medical diagnosis(es)? Yes Addressing deficits in the following areas: balance, endurance, locomotion, strength, transferring, bowel/bladder control, bathing, dressing, feeding, grooming, toileting and psychosocial support, cognition 5. Can the patient actively participate in an intensive therapy program of at least 3 hrs of therapy per day at least 5 days per week? Yes 6. The potential for patient to make measurable gains while on inpatient rehab is good 7. Anticipated functional outcomes upon discharge from inpatient rehab are min assist  with PT, min assist with OT, supervision with SLP. 8. Estimated rehab length of stay to reach the above functional goals is: 12-16 days 9. Anticipated discharge destination: Home 10. Overall Rehab/Functional Prognosis: good   RECOMMENDATIONS: This patient's condition is appropriate for continued rehabilitative care in the following setting: CIR Patient has agreed to participate in recommended program. Yes Note that insurance prior authorization may be required for reimbursement for recommended care.   Comment:  mrs. Burbridge would be an excellent CIR candidate if family support can be confirmed as she will likely require supervision upon discharge from CIR.    Jacquelynn Cree, PA-C 08/12/2019    I have personally  performed a face to face diagnostic evaluation, including, but not limited to relevant history and physical exam findings, of this patient and developed relevant assessment and plan.  Additionally, I have reviewed and concur with the physician assistant's documentation above.   [name], MD        Revision History                     Routing History           Note Details  Author Carlis Abbott, Drema Pry, MD File Time 08/12/2019 11:36 AM  Author Type Physician Status Signed  Last Editor Caitlin Chin, MD Service Physical Medicine and Rehabilitation

## 2019-08-13 NOTE — Plan of Care (Signed)
  Problem: RH SAFETY Goal: RH STG ADHERE TO SAFETY PRECAUTIONS W/ASSISTANCE/DEVICE Description: STG Adhere to Safety Precautions With mod I Outcome: Progressing Goal: RH STG DECREASED RISK OF FALL WITH ASSISTANCE Description: STG Decreased Risk of Fall With mod I  Outcome: Progressing   Problem: RH PAIN MANAGEMENT Goal: RH STG PAIN MANAGED AT OR BELOW PT'S PAIN GOAL Description: Less than 4  Outcome: Progressing

## 2019-08-13 NOTE — Progress Notes (Signed)
Inpatient Rehabilitation-Admissions Coordinator   Received medical approval from attending service for admit to CIR today. Notified pt of bed offer and she has accepted. Reviewed insurance benefits letter and consent form with pt. All questions answered. Have notified her sons as well. Pt is aware she will go into a semi-private room in CIR and has agreed to that bed offer. RN and [facility] team aware of plan for today.   Please call if questions.   Cheri Rous, OTR/L  Rehab Admissions Coordinator  [phone] 08/13/2019 11:35 AM

## 2019-08-13 NOTE — H&P (Addendum)
Physical Medicine and Rehabilitation Admission H&P    Chief Complaint  Patient presents with  . IPH with functional deficits.     HPI: Caitlin Vasquez is a 74 year old female with history of COPD- denies oxygen use, CAF- no AC, PAD who was admitted via [facility] on 08/10/19 with reports of HA, left inattention and difficulty walking. 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 UDS post ive for opiates. CT head done revealing IPH hemorrhage within paramedian inferior left cerebellum and stable parenchymal volume loss. 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 MRI brain showed no change in small IPH left cerebellum with surrounding mass effect, small amount of SAH and moderate ischemic infract in R-PCA. Dr. Maurice Small consulted for input and felt that no surgical intervention needed but ot repeat MRI w/wo contrast when able to rule out underlying mass due to uncommon location of hemorrhage.   She was started on cleveprex for BP control and weaned off last evening. She was also found to be hypokalemic with K-3.3 and had leucocytosis with WBC-18.2.  She developed fever later that night with T-max 100.4 and blood cultures X 2 done and pending.  CXR done and was negative for acute changes but showed chronic interstitial coarsening and emphysema. Urine culture showed E coli and she was started on ceftriaxone. MBS done to evaluate swallow due to mild cough with oral intake and showed aspiration of thins and difficulty with mastication--> on dysphagia 3, nectar liquids per recs. Coreg and Norvasc added yesterday with SBP goal now <180.  Leucocytosis felt to be reactive and has resolved. She has had continued drop in H/H. Was cleared to start lovenox  today for DVT prophylaxis.    Review of Systems  Constitutional: Negative for chills and fever.  HENT: Negative for hearing loss and tinnitus.   Eyes: Negative for blurred vision and double vision.  Respiratory: Negative for cough and shortness of breath.   Cardiovascular: Negative for chest pain and leg swelling.  Gastrointestinal: Negative for abdominal pain, constipation, heartburn and nausea.  Genitourinary: Negative for dysuria, frequency and hematuria.       Usually gets up once at nights  Musculoskeletal: Positive for myalgias.  Skin: Negative for rash.  Neurological: Positive for focal weakness, weakness and headaches (new since admission).  Psychiatric/Behavioral: The patient is not nervous/anxious and does not have insomnia.       Past Medical History:  Diagnosis Date  . COPD (chronic obstructive pulmonary disease) (HCC)   . High cholesterol   . On home oxygen therapy    "2L; all the time" (04/06/2016)  . Pneumonia    "when I was a kid"   . Urinary hesitancy     Past Surgical History:  Procedure Laterality Date  . ABDOMINAL AORTOGRAM N/A 04/08/2016   Procedure: Abdominal Aortogram;  Surgeon: Sherren Kerns, MD;  Location: [facility] INVASIVE CV LAB;  Service: Cardiovascular;  Laterality: N/A;  . CARDIAC CATHETERIZATION N/A 11/28/2014   Procedure: Right/Left Heart Cath and Coronary Angiography;  Surgeon: [name], MD;  Location: [facility] INVASIVE CV LAB;  Service: Cardiovascular;  Laterality: N/A;  . CARDIAC CATHETERIZATION  08/2006   Hattie Perch 08/25/2006  . DRESSING CHANGE UNDER ANESTHESIA Left 04/12/2016   Procedure: DRESSING CHANGE LEFT HIP AND LEFT  HEEL;  Surgeon: Fransisco HertzBrian L Chen, MD;  [facility];  Service: Vascular;  Laterality: Left;  . FEMORAL-POPLITEAL BYPASS GRAFT Left 04/12/2016   Procedure: LEFT FEMORAL-POPITEAL  BYPASS GRAFT;  Surgeon: Fransisco HertzBrian L Chen, MD;  [facility];  Service: Vascular;  Laterality: Left;  . JOINT REPLACEMENT    . LOWER EXTREMITY ANGIOGRAPHY  Bilateral 04/08/2016   Procedure: Lower Extremity Angiography;  Surgeon: Sherren Kernsharles E Fields, MD;  [facility];  Service: Cardiovascular;  Laterality: Bilateral;  . PERIPHERAL VASCULAR INTERVENTION Bilateral 04/08/2016   Procedure: Peripheral Vascular Intervention;  Surgeon: Sherren Kernsharles E Fields, MD;  [facility];  Service: Cardiovascular;  Laterality: Bilateral;  common iliac  . TOTAL HIP ARTHROPLASTY Left 01/29/2016  . VEIN HARVEST Left 04/12/2016   Procedure: LEFT GREATER SAPHENOUS VEIN HARVEST;  Surgeon: Fransisco HertzBrian L Chen, MD;  [facility];  Service: Vascular;  Laterality: Left;    Family History  Problem Relation Age of Onset  . Heart attack Father     Social History:  reports that she quit smoking about 5 years ago. Her smoking use included cigarettes. She has a 55.00 pack-year smoking history. She has never used smokeless tobacco. She reports that she does not drink alcohol and does not use drugs.    Allergies  Allergen Reactions  . Clonidine Derivatives Other (See Comments)    hypotension    Medications Prior to Admission  Medication Sig Dispense Refill  . acetaminophen (TYLENOL) 500 MG tablet Take 500 mg by mouth 2 (two) times daily as needed for mild pain.    Marland Kitchen. albuterol (PROVENTIL HFA;VENTOLIN HFA) 108 (90 BASE) MCG/ACT inhaler Inhale 2 puffs into the lungs every 6 (six) hours as needed for wheezing or shortness of breath.    Melene Muller. [START ON 08/14/2019] amLODipine (NORVASC) 5 MG tablet Take 1 tablet (5 mg total) by mouth daily.    Melene Muller. [START ON 08/18/2019] aspirin 81 MG EC tablet Take 1 tablet (81 mg total) by mouth daily. 30 tablet 3  . atorvastatin (LIPITOR) 80 MG tablet Take 1 tablet (80 mg total) by mouth daily.    . carvedilol (COREG) 12.5 MG tablet Take 1 tablet (12.5 mg total) by mouth 2 (two) times daily with a meal. (Patient not taking: Reported on 08/10/2019) 60 tablet 3  . [START ON 08/14/2019] cefTRIAXone 1 g in sodium chloride 0.9 % 100 mL Inject 1 g into  the vein daily.    Melene Muller. [START ON 08/14/2019] enoxaparin (LOVENOX) 40 MG/0.4ML injection Inject 0.4 mLs (40 mg total) into the skin daily. 0 mL   . Maltodextrin-Xanthan Gum (RESOURCE THICKENUP CLEAR) POWD Take 120 g by mouth as needed (nectar thick liquids).    . senna-docusate (SENOKOT-S) 8.6-50 MG tablet Take 1 tablet by mouth 2 (two) times daily.    . sodium chloride 0.9 % infusion Inject 50 mLs into the vein continuous. 1000 mL 0    Drug Regimen Review  Drug regimen was reviewed and remains appropriate with no significant issues identified    Blood pressure (!) 131/52, pulse 72, temperature (!) 97.5 F (36.4 C), resp. rate 16, height 5\' 4"  (1.626 m), weight 62.6 kg, SpO2 91 %.  Physical Exam  General: Thin frail appearing female--moaning at times due to back pain as well as HA.  Oxygen in place.   HEENT: Head is normocephalic, atraumatic, PERRLA, EOMI, sclera anicteric, oral mucosa pink and moist, dentition intact, ext ear canals clear,  Neck: Supple without JVD or lymphadenopathy Heart:  Reg rate and rhythm. No murmurs rubs or gallops Chest: CTA bilaterally without wheezes, rales, or rhonchi; no distress Abdomen: Soft, non-tender, non-distended, bowel sounds positive. Extremities: No clubbing, cyanosis, or edema. Pulses are 2+ Skin: Mild erythema with tenderness right wrist--question due to infiltration or early cellulitis. Diffuse ecchymosis left hand/forearm. Onychomycotic toenails. Dry flaky skin bilateral feet.   Neuro: Mild left facial weakness with soft voice--tremulous at times. She was able to answer most orientation questions--asked about her roommate/aware that she's going to semi-private room.  Scanning better to the left but still tends to slump to the left. Left inattention with tendency to slump to the left. Left visual field deficits but able to scan to the left with cues. 4/5 strength LUE, strength is otherwise intact. Difficulty with finger-to-nose on left Psych: pleasant  affect, cooperative    Results for orders placed or performed during the hospital encounter of 08/10/19 (from the past 48 hour(s))  Basic metabolic panel     Status: Abnormal   Collection Time: 08/12/19  7:36 AM  Result Value Ref Range   Sodium 134 (L) 135 - 145 mmol/L    Comment: POST-ULTRACENTRIFUGATION   Potassium 4.0 3.5 - 5.1 mmol/L    Comment: HEMOLYSIS AT THIS LEVEL MAY AFFECT RESULT   Chloride 100 98 - 111 mmol/L   CO2 21 (L) 22 - 32 mmol/L   Glucose, Bld 110 (H) 70 - 99 mg/dL    Comment: Glucose reference range applies only to samples taken after fasting for at least 8 hours.   BUN 14 8 - 23 mg/dL   Creatinine, Ser 4.09 0.44 - 1.00 mg/dL   Calcium 8.2 (L) 8.9 - 10.3 mg/dL   GFR calc non Af Amer 58 (L) >60 mL/min   GFR calc Af Amer >60 >60 mL/min   Anion gap 13 5 - 15    Comment: Performed at [facility] Lab, [address] [place], [place] [address]  CBC     Status: Abnormal   Collection Time: 08/12/19  7:36 AM  Result Value Ref Range   WBC 11.3 (H) 4.0 - 10.5 K/uL   RBC 3.90 3.87 - 5.11 MIL/uL   Hemoglobin 12.6 12.0 - 15.0 g/dL    Comment: CORRECTED FOR LIPEMIA   HCT 33.7 (L) 36 - 46 %   MCV 86.4 80.0 - 100.0 fL   MCH 32.3 26.0 - 34.0 pg   MCHC 37.4 (H) 30.0 - 36.0 g/dL    Comment: CORRECTED FOR LIPEMIA   RDW 13.4 11.5 - 15.5 %   Platelets 272 150 - 400 K/uL   nRBC 0.4 (H) 0.0 - 0.2 %    Comment: Performed at [facility] Lab, [address] [place], [place] [address]  Urine Culture     Status: Abnormal (Preliminary result)   Collection Time: 08/12/19 10:35 AM   Specimen: Urine, Catheterized  Result Value Ref Range   Specimen Description URINE, CATHETERIZED    Special Requests NONE    Culture (A)     >=100,000 COLONIES/mL ESCHERICHIA COLI 40,000 COLONIES/mL STREPTOCOCCUS GALLOLYTICUS CULTURE REINCUBATED FOR BETTER GROWTH SUSCEPTIBILITIES TO FOLLOW Performed at [facility] Lab, [address], [place], [place] [address]    Report Status PENDING     Basic metabolic panel     Status: Abnormal   Collection Time: 08/13/19  6:02 AM  Result Value Ref Range   Sodium 139 135 - 145 mmol/L   Potassium 3.4 (L) 3.5 - 5.1 mmol/L   Chloride  105 98 - 111 mmol/L   CO2 24 22 - 32 mmol/L   Glucose, Bld 103 (H) 70 - 99 mg/dL    Comment: Glucose reference range applies only to samples taken after fasting for at least 8 hours.   BUN 12 8 - 23 mg/dL   Creatinine, Ser 0.88 0.44 - 1.00 mg/dL   Calcium 8.0 (L) 8.9 - 10.3 mg/dL   GFR calc non Af Amer 56 (L) >60 mL/min   GFR calc Af Amer >60 >60 mL/min   Anion gap 10 5 - 15    Comment: Performed at [facility], [address] [place], [place] [address]  CBC     Status: Abnormal   Collection Time: 08/13/19  6:02 AM  Result Value Ref Range   WBC 7.8 4.0 - 10.5 K/uL   RBC 3.74 (L) 3.87 - 5.11 MIL/uL   Hemoglobin 10.6 (L) 12.0 - 15.0 g/dL   HCT 59.4 (L) 36 - 46 %   MCV 89.3 80.0 - 100.0 fL   MCH 28.3 26.0 - 34.0 pg   MCHC 31.7 30.0 - 36.0 g/dL   RDW 58.5 92.9 - 24.4 %   Platelets 210 150 - 400 K/uL   nRBC 0.0 0.0 - 0.2 %    Comment: Performed at [facility] Lab, [address] [place], [place] [address]   DG Swallowing Func-Speech Pathology  Result Date: 08/12/2019 Objective Swallowing Evaluation: Type of Study: MBS-Modified Barium Swallow Study  Patient Details Name: Caitlin Vasquez MRN: [id number] Date of Birth: Jul 11, 1945 Today's Date: 08/12/2019 Time: SLP Start Time (ACUTE ONLY): 1345 -SLP Stop Time (ACUTE ONLY): 1407 SLP Time Calculation (min) (ACUTE ONLY): 22 min Past Medical History: Past Medical History: Diagnosis Date . COPD (chronic obstructive pulmonary disease) (HCC)  . High cholesterol  . On home oxygen therapy   "2L; all the time" (04/06/2016) . Pneumonia   "when I was a kid"  . Urinary hesitancy  Past Surgical History: Past Surgical History: Procedure Laterality Date . ABDOMINAL AORTOGRAM N/A 04/08/2016  Procedure: Abdominal Aortogram;  Surgeon: Sherren Kerns, MD;  Location: [facility]  INVASIVE CV LAB;  Service: Cardiovascular;  Laterality: N/A; . CARDIAC CATHETERIZATION N/A 11/28/2014  Procedure: Right/Left Heart Cath and Coronary Angiography;  Surgeon: [name], MD;  Location: [facility] INVASIVE CV LAB;  Service: Cardiovascular;  Laterality: N/A; . CARDIAC CATHETERIZATION  08/2006  Hattie Perch 08/25/2006 . DRESSING CHANGE UNDER ANESTHESIA Left 04/12/2016  Procedure: DRESSING CHANGE LEFT HIP AND LEFT HEEL;  Surgeon: Fransisco Hertz, MD;  Location: [facility] OR;  Service: Vascular;  Laterality: Left; . FEMORAL-POPLITEAL BYPASS GRAFT Left 04/12/2016  Procedure: LEFT FEMORAL-POPITEAL  BYPASS GRAFT;  Surgeon: Fransisco Hertz, MD;  Location: [facility] OR;  Service: Vascular;  Laterality: Left; . JOINT REPLACEMENT   . LOWER EXTREMITY ANGIOGRAPHY Bilateral 04/08/2016  Procedure: Lower Extremity Angiography;  Surgeon: Sherren Kerns, MD;  Location: [facility] INVASIVE CV LAB;  Service: Cardiovascular;  Laterality: Bilateral; . PERIPHERAL VASCULAR INTERVENTION Bilateral 04/08/2016  Procedure: Peripheral Vascular Intervention;  Surgeon: Sherren Kerns, MD;  Location: [facility] INVASIVE CV LAB;  Service: Cardiovascular;  Laterality: Bilateral;  common iliac . TOTAL HIP ARTHROPLASTY Left 01/29/2016 . VEIN HARVEST Left 04/12/2016  Procedure: LEFT GREATER SAPHENOUS VEIN HARVEST;  Surgeon: Fransisco Hertz, MD;  Location: [facility] OR;  Service: Vascular;  Laterality: Left; HPI: 74 y.o. female past medical history of advanced COPD, hypercholesterolemia, atrial fibrillation not on anticoagulation, presents to the emergency room as a transfer from [facility] for evaluation of cerebellar bleed. CT head demonstrates acute hemorrhage within the paramedian inferior left cerebellar hemisphere with adjacent small volume acute subarachnoid hemorrhage-likely extension of some subarachnoid bleed into the fourth ventricle.  Subjective: upright, cooperative, needs cues Assessment / Plan / Recommendation CHL IP CLINICAL IMPRESSIONS 08/12/2019 Clinical Impression Pt has a  mild oropharyngeal dysphagia due to impaired coordination, timing, and oral control. She does not fully masticate solid foods, although she does clear the pieces of food that aren't chewed. There is reduced posterior transit and decreased bolus cohesion with varying amounts of mild-moderate lingual residue across different boluses. She spontaneously pushes oral residue back to her pharynx but then it stays int he valleculae without triggering a second swallow. Boluses also spill back into the pharynx as she is trying to perform her initial swallow. Impaired timing as her pyriform sinuses begin to fill with thin liquids results in aspiration with inconsistent sensation. This is not improved with cup vs straw sips or with use of chin tuck. Recommend Dys 3 diet and nectar thick liquids.  SLP Visit Diagnosis Dysphagia, oropharyngeal phase (R13.12) Attention and concentration deficit following -- Frontal lobe and executive function deficit following -- Impact on safety and function Mild aspiration risk   CHL IP TREATMENT RECOMMENDATION 08/12/2019 Treatment Recommendations Therapy as outlined in treatment plan below   Prognosis 08/12/2019 Prognosis for Safe Diet Advancement Good Barriers to Reach Goals Cognitive deficits Barriers/Prognosis Comment -- CHL IP DIET RECOMMENDATION 08/12/2019 SLP Diet Recommendations Dysphagia 3 (Mech soft) solids;Nectar thick liquid Liquid Administration via Cup;Straw Medication Administration Whole meds with puree Compensations Slow rate;Small sips/bites Postural Changes Seated upright at 90 degrees   CHL IP OTHER RECOMMENDATIONS 08/12/2019 Recommended Consults -- Oral Care Recommendations Oral care BID Other Recommendations Order thickener from pharmacy;Prohibited food (jello, ice cream, thin soups);Remove water pitcher   CHL IP FOLLOW UP RECOMMENDATIONS 08/12/2019 Follow up Recommendations Inpatient Rehab   CHL IP FREQUENCY AND DURATION 08/12/2019 Speech Therapy Frequency (ACUTE ONLY) min 2x/week  Treatment Duration 2 weeks      CHL IP ORAL PHASE 08/12/2019 Oral Phase Impaired Oral - Pudding Teaspoon -- Oral - Pudding Cup -- Oral - Honey Teaspoon -- Oral - Honey Cup -- Oral - Nectar Teaspoon -- Oral - Nectar Cup Reduced posterior propulsion;Lingual/palatal residue;Delayed oral transit;Decreased bolus cohesion Oral - Nectar Straw Reduced posterior propulsion;Lingual/palatal residue;Delayed oral transit;Decreased bolus cohesion Oral - Thin Teaspoon -- Oral - Thin Cup Reduced posterior propulsion;Lingual/palatal residue;Delayed oral transit;Decreased bolus cohesion;Premature spillage Oral - Thin Straw Reduced posterior propulsion;Lingual/palatal residue;Delayed oral transit;Decreased bolus cohesion;Premature spillage Oral - Puree Reduced posterior propulsion;Lingual/palatal residue;Delayed oral transit;Decreased bolus cohesion Oral - Mech Soft Reduced posterior propulsion;Lingual/palatal residue;Delayed oral transit;Decreased bolus cohesion;Impaired mastication Oral - Regular -- Oral - Multi-Consistency -- Oral - Pill -- Oral Phase - Comment --  CHL IP PHARYNGEAL PHASE 08/12/2019 Pharyngeal Phase Impaired Pharyngeal- Pudding Teaspoon -- Pharyngeal -- Pharyngeal- Pudding Cup -- Pharyngeal -- Pharyngeal- Honey Teaspoon -- Pharyngeal -- Pharyngeal- Honey Cup -- Pharyngeal -- Pharyngeal- Nectar Teaspoon -- Pharyngeal -- Pharyngeal- Nectar Cup Delayed swallow initiation-vallecula Pharyngeal -- Pharyngeal- Nectar Straw Delayed swallow initiation-vallecula Pharyngeal -- Pharyngeal- Thin Teaspoon -- Pharyngeal -- Pharyngeal- Thin Cup Delayed swallow initiation-pyriform sinuses;Penetration/Aspiration before swallow Pharyngeal Material enters airway, passes BELOW cords without attempt by patient to eject out (silent aspiration);Material enters airway, passes BELOW cords and not ejected out despite cough attempt by patient Pharyngeal- Thin Straw Delayed swallow initiation-pyriform sinuses;Penetration/Aspiration before swallow  Pharyngeal Material enters airway, passes BELOW cords without attempt by  patient to eject out (silent aspiration);Material enters airway, passes BELOW cords and not ejected out despite cough attempt by patient Pharyngeal- Puree Delayed swallow initiation-vallecula Pharyngeal -- Pharyngeal- Mechanical Soft Delayed swallow initiation-vallecula Pharyngeal -- Pharyngeal- Regular -- Pharyngeal -- Pharyngeal- Multi-consistency -- Pharyngeal -- Pharyngeal- Pill -- Pharyngeal -- Pharyngeal Comment --  CHL IP CERVICAL ESOPHAGEAL PHASE 08/12/2019 Cervical Esophageal Phase WFL Pudding Teaspoon -- Pudding Cup -- Honey Teaspoon -- Honey Cup -- Nectar Teaspoon -- Nectar Cup -- Nectar Straw -- Thin Teaspoon -- Thin Cup -- Thin Straw -- Puree -- Mechanical Soft -- Regular -- Multi-consistency -- Pill -- Cervical Esophageal Comment -- Mahala Menghini., M.A. CCC-SLP Acute Rehabilitation Services Pager [phone] Office [phone] 08/12/2019, 3:02 PM                  Medical Problem List and Plan: 1.  Impaired mobility and ADLs secondary to cerebellar hemorrhage  -patient may shower  -ELOS/Goals: 10-12 days MinA 2.  Antithrombotics: -DVT/anticoagulation:  Pharmaceutical: Lovenox  -antiplatelet therapy: N/A due to bleed.  3. Headache Pain Management: Tylenol prn effective for HA. Will trial hydrocodone X 1 dose and monitor for SE this evening.   4. Mood: LCSW to follow for evaluation and support.   -antipsychotic agents: N/a 5. Neuropsych: This patient is capable of making decisions on her own behalf. 6. Skin/Wound Care: Will order air mattress overlay for DTI. Routine pressure relief measures.  7. Fluids/Electrolytes/Nutrition: Monitor I/O. Check lytes in am. Encourage nectar liquids.  8. HTN: Monitor bp qid as poorly controlled---SBP goal < 160. Was started on coreg and Norvasc on 07/05 --monitor and titrate as indicated. Received 2 doses of IV Labetolol on 7/6.  9. E coli UTI: Back pain appears to be flank pain.  Rocephin D#2-->transition to keflex  q8H X 5 days additionally.  10. A fib: No AC due to bleed. Monitor HR tid. Continue coreg. Currently well controlled.  11. Dysphagia:  On Dysphagia 3, nectar liquids with swallow precautions.  12. Recurrent hypokalemia: Likely dilutional--will d/c fluids and add oral supplement X 2 days. Will recheck labs in am.  12. ABLA: H/H drop from 14.0--->10.6 today. Monitor for signs of bleeding. Continue PPI. Will order stool guaiacs.  13. COPD: Has been off oxygen for some time. Will d/c oxygen and use prn. Continue albuterol prn.  14.  Right wrist pain: Question due to infiltration v/s cellulitis. Will add warm compresses. Continue antibiotics for now.   15. Dyslipidemia: Consider statin at discharge as contraindicated due to bleed.   16. Urinary retention: Foley placed prior to transfer to rehab--will d/c and monitor voiding with PVRs/ bladder scans.  I/O cath for volumes > 300 cc. Should improve with treatment of UTI.    Delle Reining, PA-C  I have personally performed a face to face diagnostic evaluation, including, but not limited to relevant history and physical exam findings, of this patient and developed relevant assessment and plan.  Additionally, I have reviewed and concur with the physician assistant's documentation above.  The patient's status has not changed. The original post admission physician evaluation remains appropriate, and any changes from the pre-admission screening or documentation from the acute chart are noted above.   [name], MD

## 2019-08-13 NOTE — Progress Notes (Addendum)
ANTICOAGULATION CONSULT NOTE  Pharmacy Consult for Lovenox Indication: VTE prophylaxis  Labs: Recent Labs    08/10/19 2043 08/10/19 2043 08/12/19 0736 08/13/19 0602  HGB 14.0   < > 12.6 10.6*  HCT 42.7  --  33.7* 33.4*  PLT 302  --  272 210  LABPROT 12.8  --   --   --   INR 1.0  --   --   --   CREATININE 1.08*  --  0.96 0.98   < > = values in this interval not displayed.    Assessment: 10 yof admitted with stroke, ICH with SAH. Pharmacy consulted to start Lovenox for VTE prophylaxis today. Per Neuro, now safe for anticoagulation. Previously on SCDs this admit. Hg down a bit to 10.6, plt WNL. SCr stable 0.98. Last weight documented ~54 kg. Will have RN update current weight to confirm. No active bleed issues reported. Noted history of afib but not on anticoagulation PTA.  Goal of Therapy:  VTE prevention Monitor platelets by anticoagulation protocol: Yes   Plan:  Lovenox 40mg  [place] q24h Monitor CBC at least q72h, SCr, s/sx bleeding Pharmacy will sign off consult and monitor peripherally   , PharmD, BCPS Clinical Pharmacist 08/13/2019 10:31 AM

## 2019-08-13 NOTE — Progress Notes (Signed)
Physical Therapy Treatment Patient Details Name: Caitlin Vasquez MRN: [id number] DOB: 28-Jun-1945 Today's Date: 08/13/2019    History of Present Illness 74 y.o. female past medical history of advanced COPD, hypercholesterolemia, atrial fibrillation not on anticoagulation, presents to the emergency room as a transfer from [facility] for evaluation of cerebellar bleed. CT head demonstrates acute hemorrhage within the paramedian inferior left cerebellar hemisphere with adjacent small volume acute subarachnoid hemorrhage-likely extension of some subarachnoid bleed into the fourth ventricle. MRI moderate sized acute ischemic nonhemorrhagic R PCA.     PT Comments    Pt remains to have significant L sided neglect, impaired L UE and LE sensation and proprioception affecting patient ability to sequence and use L UE and LE functionally during ambulation with transfers, requiring maxA for transfers and ambulation. Discussed at length with son, Sharia Reeve, of patients symptoms and prognosis of patients left sided deficits in addition to compensatory techniques for patient and how he can help by always being on patient's L side. Continue to recommend CIR Upon d/c for maximal functional recovery prior to transition home with son and daughter in law. Acute PT to cont to follow.    Follow Up Recommendations  CIR     Equipment Recommendations  Wheelchair (measurements PT);Wheelchair cushion (measurements PT);Hospital bed    Recommendations for Other Services Rehab consult     Precautions / Restrictions Precautions Precautions: Fall Precaution Comments: severe L sided neglect/inattention Restrictions Weight Bearing Restrictions: No    Mobility  Bed Mobility Overal bed mobility: Needs Assistance Bed Mobility: Supine to Sit     Supine to sit: Mod assist     General bed mobility comments: max directional verbal cues to sequence transferring LEs and trunk to the L, pt with strong L lateral  lean requiring maxA to achieve midline position  Transfers Overall transfer level: Needs assistance Equipment used: 2 person hand held assist Transfers: Sit to/from Stand Sit to Stand: Max assist;+2 physical assistance Stand pivot transfers: Max assist;+2 physical assistance       General transfer comment: pt requiring max verbal and tactile cues to push up with L UE and LE, requiring blocking of L knee, pt maxAX2 to power up into standing, pt with strong heavy lean to the L  Ambulation/Gait Ambulation/Gait assistance: Max assist;+2 physical assistance;+2 safety/equipment (RN to push IV, son, Sharia Reeve, to push chair) Gait Distance (Feet): 20 Feet (x1, 5x1) Assistive device: 2 person hand held assist (RW for second bout) Gait Pattern/deviations: Step-to pattern;Decreased stride length;Decreased stance time - left;Decreased step length - left;Decreased dorsiflexion - left;Decreased weight shift to right;Narrow base of support Gait velocity: slow Gait velocity interpretation: <1.8 ft/sec, indicate of risk for recurrent falls General Gait Details: pt with strong L lateral lean despite max verbal and tactile cues, pt with significant fear of falling, pt with no use of L UE to support self, despite max verbal and tactile cues. Pt able to move arm however due to impaired proprioception and sensation put unable to use functionally. Pt very ataxic and requiring min/modA to advance L LE and to block knee to advance R LE. pt with very narrow gait pattern   Stairs             Wheelchair Mobility    Modified Rankin (Stroke Patients Only) Modified Rankin (Stroke Patients Only) Pre-Morbid Rankin Score: Moderate disability Modified Rankin: Severe disability     Balance Overall balance assessment: Needs assistance Sitting-balance support: Bilateral upper extremity supported;Feet supported Sitting balance-Leahy Scale: Poor Sitting  balance - Comments: L lateral lean Postural control: Left  lateral lean Standing balance support: Bilateral upper extremity supported Standing balance-Leahy Scale: Poor Standing balance comment: maxA to maintain Standing balanc,e pt with significant L lateral lean and bilat knee flexion                            Cognition Arousal/Alertness: Awake/alert Behavior During Therapy: Flat affect Overall Cognitive Status: Impaired/Different from baseline Area of Impairment:  (pt oriented to hospital and brain bleed)                   Current Attention Level: Sustained Memory: Decreased recall of precautions Following Commands: Follows one step commands with increased time Safety/Judgement: Decreased awareness of safety;Decreased awareness of deficits (severe L sided neglect) Awareness: Emergent Problem Solving: Slow processing;Decreased initiation;Difficulty sequencing;Requires verbal cues;Requires tactile cues General Comments: pt requiring max directional and verbal cues to acknowledge L side      Exercises      General Comments General comments (skin integrity, edema, etc.): VSS, discussed inpatient rehab/CIR with son and patient      Pertinent Vitals/Pain Pain Assessment: Faces Faces Pain Scale: No hurt    Home Living                      Prior Function            PT Goals (current goals can now be found in the care plan section) Progress towards PT goals: Progressing toward goals    Frequency    Min 4X/week      PT Plan Current plan remains appropriate    Co-evaluation              AM-PAC PT "6 Clicks" Mobility   Outcome Measure  Help needed turning from your back to your side while in a flat bed without using bedrails?: A Lot Help needed moving from lying on your back to sitting on the side of a flat bed without using bedrails?: A Lot Help needed moving to and from a bed to a chair (including a wheelchair)?: A Lot Help needed standing up from a chair using your arms (e.g., wheelchair  or bedside chair)?: A Lot Help needed to walk in hospital room?: Total Help needed climbing 3-5 steps with a railing? : Total 6 Click Score: 10    End of Session Equipment Utilized During Treatment: Oxygen (3LO2 via [place], on 3Lo2 via [place]) Activity Tolerance: Patient tolerated treatment well Patient left: in chair;with call bell/phone within reach;with chair alarm set;with family/visitor present Nurse Communication: Mobility status PT Visit Diagnosis: Unsteadiness on feet (R26.81);Other abnormalities of gait and mobility (R26.89);Ataxic gait (R26.0);Other symptoms and signs involving the nervous system (R29.898)     Time: 5885-0277 PT Time Calculation (min) (ACUTE ONLY): 30 min  Charges:  $Gait Training: 8-22 mins $Neuromuscular Re-education: 8-22 mins                     Lewis Shock, PT, DPT Acute Rehabilitation Services Pager #: [phone] Office #: [phone]    Iona Hansen 08/13/2019, 9:48 AM

## 2019-08-13 NOTE — Progress Notes (Signed)
Caitlin Vasquez, OT  Rehab Admission Coordinator  Physical Medicine and Rehabilitation  PMR Pre-admission      Signed  Date of Service:  08/12/2019  2:11 PM      Related encounter: ED to Hosp-Admission (Current) from 08/10/2019 in [place] NEURO/TRAUMA/SURGICAL ICU      Signed       PMR Admission Coordinator Pre-Admission Assessment   Patient: Caitlin Vasquez is an 74 y.o., female MRN: [id number] DOB: 03-19-1945 Height:   Weight:                                                                                                                                                    Insurance Information HMO:     PPO:      PCP:      IPA:      80/20: yes     OTHER:  PRIMARY: Medicare A and B      Policy#: [id number]      Subscriber: patient CM Name:       Phone#:      Fax#:  Pre-Cert#:       Employer:  Benefits:  Phone #:      Name: verified eligibility online via [place] on 08/12/19 Eff. Date: part A and B effective 01/07/2009     Deduct: $1,484      Out of Pocket Max: NA      Life Max: NA  CIR: Covered per Medicare guidelines once yearly deductible has been met      SNF: days 1-20, 100%, days 21-100, 80%.  Outpatient: 80%     Co-Pay: 20% Home Health: 100%      Co-Pay:  DME: 80%     Co-Pay: 20% Providers: pt'Vasquez choice SECONDARY: [employer]      Policy#: [id number]      Phone#: [phone]   Financial Counselor:       Phone#:    The "Data Collection Information Summary" for patients in Inpatient Rehabilitation Facilities with attached "Privacy Act [place] Records" was provided and verbally reviewed with: Patient   Emergency Contact Information Contact Information     Name Relation Home Work Mobile    [place] Son [phone]   [phone]       Current Medical History  Patient Admitting Diagnosis: Cerebellar hemorrhage   History of Present Illness: Caitlin Vasquez is a 74 y.o. RH-female with history of CAF-no AC, COPD--has been off oxygen for years,  PAD  who was admitted via [facility] on 08/10/19 with HA, left inattention and difficulty walking due to acute left cerebellar hemorrhage with intraventricular extension. 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 MRI brain showed no change in small  IPH left cerebellum with surrounding mass effect, small amount SAH and moderate ischemic infarct R-PCA with moderately advanced cerebral atrophy. Dr. Joaquim Vasquez consulted for input and felt that no surgical intervention needed and recommended MRI w/wo contrast when able to rule out underlying mass. She was started on Cleveprex for BP control and 2 D echo done for work up.  She has had urinary retention requiring I/O caths. Therapy evaluations completed revealing deficits in attention and memory, significant left inattention with ataxia affecting functional status. CIR was recommended due to functional deficits. 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 Pt is to admit to CIR on 08/13/19.    Complete NIHSS TOTAL: 10 Glasgow Coma Scale Score: 15   Past Medical History      Past Medical History:  Diagnosis Date  . COPD (chronic obstructive pulmonary disease) ([place])    . High cholesterol    . On home oxygen therapy      "2L; all the time" (04/06/2016)  . Pneumonia      "when I was a kid"   . Urinary hesitancy        Family History  family history includes Heart attack in her father.   Prior Rehab/Hospitalizations:  Has the patient had prior rehab or hospitalizations prior to admission? No   Has the patient had major surgery during 100 days prior to admission? No   Current Medications    Current Facility-Administered Medications:  .  0.9 %  sodium chloride infusion, , Intravenous, Continuous, Vasquez, Caitlin L, NP, Last  Rate: 50 mL/hr at 08/13/19 0800, Rate Verify at 08/13/19 0800 .  acetaminophen (TYLENOL) tablet 650 mg, 650 mg, Oral, Q4H PRN, 650 mg at 08/13/19 0517 **OR** acetaminophen (TYLENOL) 160 MG/5ML solution 650 mg, 650 mg, Per Tube, Q4H PRN **OR** acetaminophen (TYLENOL) suppository 650 mg, 650 mg, Rectal, Q4H PRN, Caitlin Portland, Vasquez .  albuterol (PROVENTIL) (2.5 MG/3ML) 0.083% nebulizer solution 2.5 mg, 2.5 mg, Nebulization, Q6H PRN, Caitlin Vasquez, Caitlin Vasquez, Vasquez .  amLODipine (NORVASC) tablet 5 mg, 5 mg, Oral, Daily, Vasquez, Caitlin L, NP, 5 mg at 08/13/19 0910 .  carvedilol (COREG) tablet 12.5 mg, 12.5 mg, Oral, BID WC, Vasquez, Caitlin L, NP, 12.5 mg at 08/13/19 0910 .  cefTRIAXone (ROCEPHIN) 1 g in sodium chloride 0.9 % 100 mL IVPB, 1 g, Intravenous, Q24H, Vasquez, Caitlin L, NP, Last Rate: 200 mL/hr at 08/13/19 1157, 1 g at 08/13/19 1157 .  chlorhexidine (PERIDEX) 0.12 % solution 15 mL, 15 mL, Mouth Rinse, BID, Caitlin Portland, Vasquez, 15 mL at 08/13/19 0912 .  Chlorhexidine Gluconate Cloth 2 % PADS 6 each, 6 each, Topical, Q0600, Caitlin Portland, Vasquez, 6 each at 08/12/19 1200 .  enoxaparin (LOVENOX) injection 40 mg, 40 mg, Subcutaneous, Q24H, von Vasquez, Caitlin B, RPH, 40 mg at 08/13/19 1158 .  labetalol (NORMODYNE) injection 10-20 mg, 10-20 mg, Intravenous, Q2H PRN, Vasquez, Caitlin L, NP, 20 mg at 08/13/19 0518 .  ondansetron (ZOFRAN) injection 4 mg, 4 mg, Intravenous, Q6H PRN, Caitlin Jury, Vasquez, 4 mg at 08/12/19 1929 .  pantoprazole (PROTONIX) EC tablet 40 mg, 40 mg, Oral, QHS, Caitlin Fila, Vasquez, 40 mg at 08/12/19 2140 .  pneumococcal 23 valent vaccine (PNEUMOVAX-23) injection 0.5 mL, 0.5 mL, Intramuscular, Tomorrow-1000, Sethi, Lucy [place], Vasquez .  Resource ThickenUp Clear, , Oral, PRN, Caitlin Fila, Vasquez .  senna-docusate (Senokot-Vasquez) tablet 1 tablet, 1 tablet, Oral, BID, Caitlin Portland, Vasquez, 1 tablet at 08/13/19 0910 .  temazepam (  RESTORIL) capsule 15 mg, 15 mg, Oral, QHS PRN, Caitlin Jury, Vasquez, 15 mg at 08/12/19 1925     Patients Current Diet:     Diet Order                      DIET DYS 3 Room service appropriate? Yes with Assist; Fluid consistency: Nectar Thick  Diet effective now                      Precautions / Restrictions Precautions Precautions: Fall Precaution Comments: severe L sided neglect/inattention Restrictions Weight Bearing Restrictions: No    Has the patient had 2 or more falls or a fall with injury in the past year?No   Prior Activity Level   Prior Functional Level Prior Function Level of Independence: Needs assistance Gait / Transfers Assistance Needed: pt reports ambulating independently for short household distances with use of cane ADL'Vasquez / Homemaking Assistance Needed: pt reports requiring assistance for all IADLs, takes a sponge bath, performs ADLs independently   Self Care: Did the patient need help bathing, dressing, using the toilet or eating?  Independent for toileting, dressing, and eating; Assist for bathing 1/week from aide.    Indoor Mobility: Did the patient need assistance with walking from room to room (with or without device)? Independent   Stairs: Did the patient need assistance with internal or external stairs (with or without device)? Independent   Functional Cognition: Did the patient need help planning regular tasks such as shopping or remembering to take medications? Needed some help   Home Assistive Devices / [place] Devices/Equipment: Cane (specify quad or straight), Walker (specify type) Home Equipment: Cane - single point   Prior Device Use: Indicate devices/aids used by the patient prior to current illness, exacerbation or injury? cane   Current Functional Level Cognition   Arousal/Alertness: Awake/alert Overall Cognitive Status: Impaired/Different from baseline Current Attention Level: Sustained Orientation Level: Oriented X4 Following Commands: Follows one step commands with increased time Safety/Judgement:  Decreased awareness of safety, Decreased awareness of deficits (severe L sided neglect) General Comments: pt requiring max directional and verbal cues to acknowledge L side Attention: Sustained Sustained Attention: Impaired Sustained Attention Impairment: Verbal basic Memory: Impaired Memory Impairment: Decreased recall of new information Awareness: Impaired Awareness Impairment: Intellectual impairment Problem Solving: Appears intact Behaviors: Restless    Extremity Assessment (includes Sensation/Coordination)   Upper Extremity Assessment: LUE deficits/detail LUE Deficits / Details: Abnormal sensory motor function with "alien arm" movement patterns; poor coordination but attempting to use LUE - required hand over hand to grasp sheet and pull it up; poor proprioceptive input - hitting LUE on side rail - noted skin tears LUE Sensation: decreased light touch, decreased proprioception LUE Coordination: decreased fine motor, decreased gross motor  Lower Extremity Assessment: Defer to PT evaluation LLE Deficits / Details: strength and ROM WFL LLE Sensation: decreased light touch LLE Coordination: decreased gross motor     ADLs   Overall ADL'Vasquez : Needs assistance/impaired Eating/Feeding: Moderate assistance Eating/Feeding Details (indicate cue type and reason): nectar; per nursing only eating food on R side of try/plate Grooming: Moderate assistance, Sitting Upper Body Bathing: Moderate assistance, Sitting Lower Body Bathing: Maximal assistance, Sit to/from stand Upper Body Dressing : Maximal assistance, Sitting Lower Body Dressing: Maximal assistance, Bed level Functional mobility during ADLs: Moderate assistance (limited to bed)     Mobility   Overal bed mobility: Needs Assistance Bed Mobility: Supine to Sit Rolling: Mod assist Supine to  sit: Mod assist Sit to supine: Mod assist General bed mobility comments: max directional verbal cues to sequence transferring LEs and trunk to the  L, pt with strong L lateral lean requiring maxA to achieve midline position     Transfers   Overall transfer level: Needs assistance Equipment used: 2 person hand held assist Transfers: Sit to/from Stand Sit to Stand: Max assist, +2 physical assistance Stand pivot transfers: Max assist, +2 physical assistance General transfer comment: pt requiring max verbal and tactile cues to push up with L UE and LE, requiring blocking of L knee, pt maxAX2 to power up into standing, pt with strong heavy lean to the L     Ambulation / Gait / Stairs / Wheelchair Mobility   Ambulation/Gait Ambulation/Gait assistance: Max assist, +2 physical assistance, +2 safety/equipment (RN to push IV, son, Merrily Pew, to push chair) Gait Distance (Feet): 20 Feet (x1, 5x1) Assistive device: 2 person hand held assist (RW for second bout) Gait Pattern/deviations: Step-to pattern, Decreased stride length, Decreased stance time - left, Decreased step length - left, Decreased dorsiflexion - left, Decreased weight shift to right, Narrow base of support General Gait Details: pt with strong L lateral lean despite max verbal and tactile cues, pt with significant fear of falling, pt with no use of L UE to support self, despite max verbal and tactile cues. Pt able to move arm however due to impaired proprioception and sensation put unable to use functionally. Pt very ataxic and requiring min/modA to advance L LE and to block knee to advance R LE. pt with very narrow gait pattern Gait velocity: slow Gait velocity interpretation: <1.8 ft/sec, indicate of risk for recurrent falls     Posture / Balance Dynamic Sitting Balance Sitting balance - Comments: L lateral lean Balance Overall balance assessment: Needs assistance Sitting-balance support: Bilateral upper extremity supported, Feet supported Sitting balance-Leahy Scale: Poor Sitting balance - Comments: L lateral lean Postural control: Left lateral lean Standing balance support:  Bilateral upper extremity supported Standing balance-Leahy Scale: Poor Standing balance comment: maxA to maintain Standing balanc,e pt with significant L lateral lean and bilat knee flexion     Special needs/care consideration Continuous Drip IV: 0.9% sodium chloride infusion, Ceftriaxone (rocephin), Oxygen : 4L [place], Skin: skin tear to left arm; hand; wound to buttocks (mid, unstageable) and Designated visitor: Larkin Ina and Merrily Pew (sons).         Previous Home Environment (from acute therapy documentation) Living Arrangements: Children  Lives With: Son Available Help at Discharge: Family, Available 24 hours/day Type of Home: House Home Layout: Two level, Able to live on main level with bedroom/bathroom Home Access: Stairs to enter Entrance Stairs-Rails: Right, Left Entrance Stairs-Number of Steps: 3 Bathroom Shower/Tub: Optometrist: No Home Care Services: No   Discharge Living Setting Plans for Discharge Living Setting: Patient'Vasquez home, House Type of Home at Discharge: House Discharge Home Layout: Two level, Able to live on main level with bedroom/bathroom Discharge Home Access: Stairs to enter Entrance Stairs-Rails: Can reach both Entrance Stairs-Number of Steps: 3 Discharge Bathroom Shower/Tub: Tub/shower unit Discharge Bathroom Toilet: Standard Discharge Bathroom Accessibility: No (States that her walker is too wide to go in the hallway ) Does the patient have any problems obtaining your medications?: No   Social/Family/Support Systems Patient Roles: Other (Comment) (Son) Contact Information: [phone] or [phone] Anticipated Caregiver: Bethan Adamek (son) Anticipated Caregiver'Vasquez Contact Information: [phone] or [phone] Ability/Limitations of Caregiver: Min A Caregiver Availability: 24/7  Goals Patient/Family Goal for Rehab: PT/OT/SLP Min Assist Expected length of stay: 10-12 days Program Orientation  Provided & Reviewed with Pt/Caregiver Including Roles  & Responsibilities: Yes     Decrease burden of Care through IP rehab admission: NA     Possible need for SNF placement upon discharge:Not anticipated; pt has great family support from her son whom she lives with who is able to provide anticipated support at DC from [facility].      Patient Condition: This patient'Vasquez medical and functional status has changed since the consult dated: 08/12/19 in which the Rehabilitation Physician determined and documented that the patient'Vasquez condition is appropriate for intensive rehabilitative care in an inpatient rehabilitation facility. Medical changes are: pt now off Cleveprex.  Functional changes are: more assistance needed for transfers from Mod A to Max A +2, and progression to initiation of gait with Max A +2 for 20 feet with 2 person HHA; Pt has also been evaluated by OT since formal PM&R consult with recommendations for CIR. After evaluating the patient today and speaking with the Rehabilitation physician and acute team, the patient remains appropriate for inpatient rehab. Will admit to inpatient rehab today.   Preadmission Screen Completed By:  Caitlin Vasquez, OT, 08/13/2019 12:07 PM ______________________________________________________________________   Discussed status with Dr. Ranell Patrick on 08/13/19 at 12:06PM and received approval for admission today.   Admission Coordinator:  Caitlin Vasquez, time 12:06PM Sudie Grumbling 08/13/19.              Cosigned by: Izora Ribas, Vasquez at 08/13/2019 12:22 PM  Revision History                Note Details  Author Caitlin Vasquez, OT File Time 08/13/2019 12:07 PM  Author Type Rehab Admission Coordinator Status Signed  Last Editor Caitlin Vasquez, OT Service Physical Medicine and Rehabilitation

## 2019-08-13 NOTE — Progress Notes (Signed)
  Speech Language Pathology Treatment: Dysphagia;Cognitive-Linquistic  Patient Details Name: Caitlin Vasquez MRN: [id number] DOB: 21-May-1945 Today's Date: 08/13/2019 Time: 2130-8657 SLP Time Calculation (min) (ACUTE ONLY): 12 min  Assessment / Plan / Recommendation Clinical Impression  Pt has a mild baseline cough, with occasional delayed cough noted across PO trials. SLP provided a variety of textures (solids, nectar thick liquids, and honey thick liquids) with different bolus delivery methods without overt changes in frequency. On MBS on previous date, there was no aspiration of nectar thick liquids or solids. RN reported no coughing during breakfast meal. Recommend continuing current diet and precautions with ongoing SLP services for safety and progression. During session, SLP also provided Min-Mod cues for recall as well as Mod cues to attend to the left side of her environment. She would benefit from CIR-level follow up post-discharge.    HPI HPI: 74 y.o. female past medical history of advanced COPD, hypercholesterolemia, atrial fibrillation not on anticoagulation, presents to the emergency room as a transfer from [facility] for evaluation of cerebellar bleed. CT head demonstrates acute hemorrhage within the paramedian inferior left cerebellar hemisphere with adjacent small volume acute subarachnoid hemorrhage-likely extension of some subarachnoid bleed into the fourth ventricle.      SLP Plan  Continue with current plan of care;MBS       Recommendations  Diet recommendations: Dysphagia 3 (mechanical soft);Nectar-thick liquid Liquids provided via: Cup;Straw Medication Administration: Whole meds with puree Supervision: Staff to assist with self feeding Compensations: Slow rate;Small sips/bites Postural Changes and/or Swallow Maneuvers: Seated upright 90 degrees                Oral Care Recommendations: Oral care BID Follow up Recommendations: Inpatient Rehab SLP  Visit Diagnosis: Dysphagia, oropharyngeal phase (R13.12);Cognitive communication deficit (R41.841) Plan: Continue with current plan of care;MBS       GO                Mahala Menghini., M.A. CCC-SLP Acute Rehabilitation Services Pager [phone] Office [phone]  08/13/2019, 11:18 AM

## 2019-08-13 NOTE — Discharge Summary (Addendum)
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 - d/t AF not on [facility] Active Problems:   Exertional dyspnea   COPD with chronic bronchitis and emphysema (HCC)   Essential hypertension   On home oxygen therapy   Atrial fibrillation (HCC)   Hypertensive emergency   E-coli UTI   Hypokalemia   Aortic atherosclerosis (HCC)   Medications to be continued on Rehab Allergies as of 08/13/2019       Reactions   Clonidine Derivatives Other (See Comments)   hypotension        Medication List     STOP taking these medications    ALPRAZolam 0.25 MG tablet Commonly known as: XANAX   clopidogrel 75 MG tablet Commonly known as: PLAVIX   collagenase ointment Commonly known as: SANTYL   docusate sodium 100 MG capsule Commonly known as: COLACE   feeding supplement (ENSURE ENLIVE) Liqd   ferrous sulfate 325 (65 FE) MG tablet   mupirocin ointment 2 % Commonly known as: BACTROBAN   pantoprazole 40 MG tablet Commonly known as: PROTONIX   potassium chloride 10 MEQ tablet Commonly known as: KLOR-CON       TAKE these medications    acetaminophen 500 MG tablet Commonly known as: TYLENOL Take 500 mg by mouth 2 (two) times daily as needed for mild pain.   albuterol 108 (90 Base) MCG/ACT inhaler Commonly known as: VENTOLIN HFA Inhale 2 puffs into the lungs every 6 (six) hours as needed for wheezing or shortness of breath.   amLODipine 5 MG tablet Commonly known as: NORVASC Take 1 tablet (5 mg total) by mouth daily. Start  taking on: August 14, 2019   aspirin 81 MG EC tablet Take 1 tablet (81 mg total) by mouth daily. Start taking on: August 18, 2019 What changed: These instructions start on August 18, 2019. If you are unsure what to do until then, ask your doctor or other care provider.   atorvastatin 80 MG tablet Commonly known as: LIPITOR Take 1 tablet (80 mg total) by mouth daily. What changed:  medication strength how much to take when to take this   carvedilol 12.5 MG tablet Commonly known as: COREG Take 1 tablet (12.5 mg total) by mouth 2 (two) times daily with a meal.   cefTRIAXone 1 g in sodium chloride 0.9 % 100 mL Inject 1 g into the vein daily. Start taking on: August 14, 2019   enoxaparin 40 MG/0.4ML injection Commonly known as: LOVENOX Inject 0.4 mLs (40 mg total) into the skin daily. Start taking on: August 14, 2019   Resource ThickenUp Clear Powd Take 120 g by mouth as needed (nectar thick liquids).   senna-docusate 8.6-50 MG tablet Commonly known as: Senokot-S Take 1 tablet by mouth 2 (two) times daily.   sodium chloride 0.9 % infusion Inject 50 mLs into the vein continuous.               Discharge Care Instructions  (From admission, onward)           Start  Ordered   08/13/19 0000  Discharge wound care:       Comments: Follow current in hospital care on rehab unit   08/13/19 1405            LABORATORY STUDIES CBC    Component Value Date/Time   WBC 7.8 08/13/2019 0602   RBC 3.74 (L) 08/13/2019 0602   HGB 10.6 (L) 08/13/2019 0602   HCT 33.4 (L) 08/13/2019 0602   PLT 210 08/13/2019 0602   MCV 89.3 08/13/2019 0602   MCH 28.3 08/13/2019 0602   MCHC 31.7 08/13/2019 0602   RDW 13.5 08/13/2019 0602   LYMPHSABS 1.0 04/14/2016 1021   MONOABS 0.7 04/14/2016 1021   EOSABS 0.1 04/14/2016 1021   BASOSABS 0.0 04/14/2016 1021   CMP    Component Value Date/Time   NA 139 08/13/2019 0602   K 3.4 (L) 08/13/2019 0602   CL 105 08/13/2019 0602   CO2 24 08/13/2019  0602   GLUCOSE 103 (H) 08/13/2019 0602   BUN 12 08/13/2019 0602   CREATININE 0.98 08/13/2019 0602   CALCIUM 8.0 (L) 08/13/2019 0602   PROT 7.2 08/10/2019 2043   ALBUMIN 3.3 (L) 08/10/2019 2043   AST 23 08/10/2019 2043   ALT 11 08/10/2019 2043   ALKPHOS 84 08/10/2019 2043   BILITOT 0.6 08/10/2019 2043   GFRNONAA 56 (L) 08/13/2019 0602   GFRAA >60 08/13/2019 0602   COAGS Lab Results  Component Value Date   INR 1.0 08/10/2019   INR 1.11 04/12/2016   INR 1.03 11/28/2014   Lipid Panel    Component Value Date/Time   CHOL 260 (H) 08/10/2019 2043   TRIG 214 (H) 08/10/2019 2043   HDL 49 08/10/2019 2043   CHOLHDL 5.3 08/10/2019 2043   VLDL 43 (H) 08/10/2019 2043   LDLCALC 168 (H) 08/10/2019 2043   HgbA1C  Lab Results  Component Value Date   HGBA1C 5.6 08/10/2019   Urinalysis    Component Value Date/Time   COLORURINE YELLOW 08/10/2019 2137   APPEARANCEUR CLEAR 08/10/2019 2137   LABSPEC 1.020 08/10/2019 2137   PHURINE 7.0 08/10/2019 2137   GLUCOSEU NEGATIVE 08/10/2019 2137   HGBUR TRACE (A) 08/10/2019 2137   BILIRUBINUR NEGATIVE 08/10/2019 2137   KETONESUR NEGATIVE 08/10/2019 2137   PROTEINUR >300 (A) 08/10/2019 2137   NITRITE POSITIVE (A) 08/10/2019 2137   LEUKOCYTESUR NEGATIVE 08/10/2019 2137   Urine Drug Screen     Component Value Date/Time   LABOPIA POSITIVE (A) 08/10/2019 2137   COCAINSCRNUR NONE DETECTED 08/10/2019 2137   LABBENZ NONE DETECTED 08/10/2019 2137   AMPHETMU NONE DETECTED 08/10/2019 2137   THCU NONE DETECTED 08/10/2019 2137   LABBARB NONE DETECTED 08/10/2019 2137     SIGNIFICANT DIAGNOSTIC STUDIES CT ANGIO HEAD W OR WO CONTRAST  Result Date: 08/11/2019 CLINICAL DATA:  74 year old female code stroke presentation yesterday with acute left cerebellar hemorrhage. Ataxia. And MRI reveals superimposed acute right PCA territory infarct. Motion degraded CTA head and neck at [facility] yesterday. EXAM: CT ANGIOGRAPHY HEAD AND NECK TECHNIQUE:  Multidetector CT imaging of the head and neck was performed using the standard protocol during bolus administration of intravenous contrast. Multiplanar CT image reconstructions and MIPs were obtained to evaluate the vascular anatomy. Carotid stenosis measurements (when applicable) are obtained utilizing NASCET criteria, using the distal internal carotid diameter as the denominator. CONTRAST:  50mL OMNIPAQUE IOHEXOL 350 MG/ML SOLN COMPARISON:  Brain MRI, head CT, and CTA head and neck 08/10/2019. FINDINGS: CTA NECK Skeleton: Most of the  dentition is absent. Multilevel cervical spine degeneration with some levels of posterior element ankylosis. Mild T5 compression fracture appears nonacute. No acute osseous abnormality identified. Upper chest: Centrilobular emphysema throughout the visible lungs. No superior mediastinal lymphadenopathy. Other neck: Negative. Aortic arch: Extensive Calcified aortic atherosclerosis. Including a small area of ulcerated plaque at the distal arch on series 5, image 155, stable. Three vessel arch configuration. Right carotid system: Brachiocephalic and right CCA soft and calcified plaque without stenosis. Calcified plaque at the right ICA origin and bulb without stenosis. However, there is a focal 65 % stenosis just distal to the bulb best seen on series 8, image 92 which appears related to either soft plaque or perhaps a carotid web. The right ICA remains patent to the skull base. Left carotid system: Abundant soft and calcified plaque at the proximal left CCA with less than 50% stenosis. Complex calcified plaque at the left ICA origin, and at the distal bulb a short segment high-grade radiographic string sign stenosis occurs as seen on series 9, image 95. The left ICA remains patent to the skull base. Vertebral arteries: Proximal right subclavian artery plaque without stenosis. Dominant right vertebral artery with calcified plaque near the origin but no stenosis. Mild right V1 through V3  segment calcified plaque and tortuosity but no significant stenosis to the skull base. Proximal left subclavian artery soft and calcified plaque without significant stenosis. Non dominant left vertebral artery with severe origin stenosis (series 8, image 125) and tandem severe stenosis in the proximal V2 segment. Additional severe left V3 segment stenosis (series 8, image 118). The vessel is diminutive and functionally terminates or is occluded at the dura. CTA HEAD Posterior circulation: Right vertebral artery supplies the basilar with V4 segment calcification resulting in up to moderate stenosis on series 7, image 138, which is proximal to the patent right PICA origin. Patent vertebrobasilar junction with probable retrograde supply to the left PICA which remains patent. Area of left cerebellar intra-axial hemorrhage on series 7, image 138. No CTA spot sign. No abnormal vascularity. Patent basilar artery without stenosis. Probable infundibulum rather than 3 mm saccular aneurysm at the left basilar tip, origin of the left SCA. Right SCA and left PCA origins are within normal limits. Posterior communicating arteries are diminutive or absent. However, just past the right PCA origin there is severe right PCA stenosis, and a diminutive appearance of the remainder of the vessel. No right PCA occlusion. Left PCA branches are mildly irregular. Anterior circulation: Both ICA siphons are patent but heavily calcified. On the left there is mild stenosis, but a superimposed 3-4 mm saccular aneurysm directed inferiorly from the distal left ICA best seen on series 9, image 84. On the right there is mild to moderate siphon stenosis in the cavernous and supraclinoid segments. Patent carotid termini. Patent MCA and ACA origins. Moderate stenosis of the right ACA A1 segment (series 11, image 19). Anterior communicating artery within normal limits. Median artery of the corpus callosum (normal variant). Mild bilateral distal ACA branch  irregularity (series 12, image 16). Left MCA M1 segment and bifurcation are patent, but there is a severe stenosis at the origin of the superior left M2 division (series 9, image 102 and series 11, image 119). However, no right MCA branch occlusion is identified. Left MCA M1 segment bifurcates early without stenosis. Right MCA branches are mildly irregular. Venous sinuses: Patent. Anatomic variants: Dominant right vertebral artery which supplies the basilar. Review of the MIP images confirms the above findings IMPRESSION:  1. Negative for CTA spot sign or abnormal vascularity in the region of the left cerebellar parenchymal hemorrhage. 2. 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 3. Positive also for severe atherosclerosis throughout the head and neck. 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 - high-grade stenosis at the Left ICA distal bulb resulting in a short segment radiographic string sign stenosis. - 65% stenosis at the Right ICA distal bulb related to either soft plaque versus carotid web. - mild to moderate bilateral ICA siphon stenosis greater on the right. - Severe stenosis of the proximal Right PCA which is diminutive but does not occlude. - moderate stenosis of the dominant Right Vertebral Artery V4 segment which supplies the Basilar. - moderate stenosis Right ACA A1 segment. - Severe stenosis Right MCA M2 superior division origin. 4. Aortic Atherosclerosis (ICD10-I70.0) and Emphysema (ICD10-J43.9). Electronically Signed   By: Odessa Fleming M.D.   On: 08/11/2019 12:33   CT Head Wo Contrast  Result Date: 08/10/2019 CLINICAL DATA:   Ataxia, stroke suspected EXAM: CT HEAD WITHOUT CONTRAST TECHNIQUE: Contiguous axial images were obtained from the base of the skull through the vertex without intravenous contrast. COMPARISON:  CT and CT angiography 08/10/2019 FINDINGS: Brain: Redemonstration of the parenchymal hemorrhage within the paramedian inferior left cerebellar hemisphere with associated adjacent subarachnoid hemorrhage into the fourth ventricle and extending laterally into the basal cisterns. Overall extent of this hemorrhage is not significantly changed from the comparison CT. No new areas of hyperdense hemorrhage are seen. No significant resulting mass effect. No CT evidence of a large acute vascular territory or cortically based infarct. Several stable hypoattenuating foci in the bilateral basal ganglia likely reflect sequela of remote lacunar infarcts. Benign senescent mineralization of the basal ganglia, similar to prior. Symmetric prominence of the ventricles, cisterns and sulci compatible with parenchymal volume loss. Patchy areas of white matter hypoattenuation are most compatible with chronic microvascular angiopathy. Vascular: Extensive calcification within the carotid siphons and right intradural vertebral artery. Skull: No calvarial fracture or suspicious osseous lesion. No scalp swelling or hematoma. Sinuses/Orbits: Paranasal sinuses and mastoid air cells are predominantly clear. Included orbital structures are unremarkable. Other: Absence of the maxillary dentition with the mandibular bridge. IMPRESSION: 1. Redemonstrated parenchymal hemorrhage within the paramedian inferior left cerebellum with associated subarachnoid hemorrhage slightly redistributed from prior without significant interval increase in the size of the hemorrhage or new areas of hemorrhage identified. 2. Stable parenchymal volume loss and chronic microvascular angiopathy. 3. Remote lacunar infarcts in the bilateral basal ganglia. Electronically Signed   By:  Kreg Shropshire M.D.   On: 08/10/2019 20:08   CT ANGIO NECK W OR WO CONTRAST  Result Date: 08/11/2019 CLINICAL DATA:  74 year old female code stroke presentation yesterday with acute left cerebellar hemorrhage. Ataxia. And MRI reveals superimposed acute right PCA territory infarct. Motion degraded [facility]A head [facility] neck at [facility] yesterday. EXAM: CT ANGIOGRAPHY HEAD AND NECK TECHNIQUE: Multidetector CT imaging of the head and neck was performed using the standard protocol during bolus administration of intravenous contrast. Multiplanar CT image reconstructions and MIPs were obtained to evaluate the vascular anatomy. Carotid stenosis measurements (when applicable) are obtained utilizing NASCET criteria, using the distal internal carotid diameter as  the denominator. CONTRAST:  50mL OMNIPAQUE IOHEXOL 350 MG/ML SOLN COMPARISON:  Brain MRI, head CT, and CTA head and neck 08/10/2019. FINDINGS: CTA NECK Skeleton: Most of the dentition is absent. Multilevel cervical spine degeneration with some levels of posterior element ankylosis. Mild T5 compression fracture appears nonacute. No acute osseous abnormality identified. Upper chest: Centrilobular emphysema throughout the visible lungs. No superior mediastinal lymphadenopathy. Other neck: Negative. Aortic arch: Extensive Calcified aortic atherosclerosis. Including a small area of ulcerated plaque at the distal arch on series 5, image 155, stable. Three vessel arch configuration. Right carotid system: Brachiocephalic and right CCA soft and calcified plaque without stenosis. Calcified plaque at the right ICA origin and bulb without stenosis. However, there is a focal 65 % stenosis just distal to the bulb best seen on series 8, image 92 which appears related to either soft plaque or perhaps a carotid web. The right ICA remains patent to the skull base. Left carotid system: Abundant soft and calcified plaque at the proximal left CCA with less than 50% stenosis. Complex  calcified plaque at the left ICA origin, and at the distal bulb a short segment high-grade radiographic string sign stenosis occurs as seen on series 9, image 95. The left ICA remains patent to the skull base. Vertebral arteries: Proximal right subclavian artery plaque without stenosis. Dominant right vertebral artery with calcified plaque near the origin but no stenosis. Mild right V1 through V3 segment calcified plaque and tortuosity but no significant stenosis to the skull base. Proximal left subclavian artery soft and calcified plaque without significant stenosis. Non dominant left vertebral artery with severe origin stenosis (series 8, image 125) and tandem severe stenosis in the proximal V2 segment. Additional severe left V3 segment stenosis (series 8, image 118). The vessel is diminutive and functionally terminates or is occluded at the dura. CTA HEAD Posterior circulation: Right vertebral artery supplies the basilar with V4 segment calcification resulting in up to moderate stenosis on series 7, image 138, which is proximal to the patent right PICA origin. Patent vertebrobasilar junction with probable retrograde supply to the left PICA which remains patent. Area of left cerebellar intra-axial hemorrhage on series 7, image 138. No CTA spot sign. No abnormal vascularity. Patent basilar artery without stenosis. Probable infundibulum rather than 3 mm saccular aneurysm at the left basilar tip, origin of the left SCA. Right SCA and left PCA origins are within normal limits. Posterior communicating arteries are diminutive or absent. However, just past the right PCA origin there is severe right PCA stenosis, and a diminutive appearance of the remainder of the vessel. No right PCA occlusion. Left PCA branches are mildly irregular. Anterior circulation: Both ICA siphons are patent but heavily calcified. On the left there is mild stenosis, but a superimposed 3-4 mm saccular aneurysm directed inferiorly from the distal  left ICA best seen on series 9, image 84. On the right there is mild to moderate siphon stenosis in the cavernous and supraclinoid segments. Patent carotid termini. Patent MCA and ACA origins. Moderate stenosis of the right ACA A1 segment (series 11, image 19). Anterior communicating artery within normal limits. Median artery of the corpus callosum (normal variant). Mild bilateral distal ACA branch irregularity (series 12, image 16). Left MCA M1 segment and bifurcation are patent, but there is a severe stenosis at the origin of the superior left M2 division (series 9, image 102 and series 11, image 119). However, no right MCA branch occlusion is identified. Left MCA M1 segment bifurcates early without stenosis.  Right MCA branches are mildly irregular. Venous sinuses: Patent. Anatomic variants: Dominant right vertebral artery which supplies the basilar. Review of the MIP images confirms the above findings IMPRESSION: 1. Negative for CTA spot sign or abnormal vascularity in the region of the left cerebellar parenchymal hemorrhage. 2. 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 3. Positive also for severe atherosclerosis throughout the head and neck. 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 - high-grade stenosis at the Left ICA distal bulb resulting in a short segment radiographic string sign stenosis. - 65% stenosis at the Right ICA distal bulb related to either soft plaque versus carotid web. - mild to moderate bilateral ICA siphon stenosis greater on the right. - Severe stenosis of the proximal Right PCA which is diminutive but does not occlude.  - moderate stenosis of the dominant Right Vertebral Artery V4 segment which supplies the Basilar. - moderate stenosis Right ACA A1 segment. - Severe stenosis Right MCA M2 superior division origin. 4. Aortic Atherosclerosis (ICD10-I70.0) and Emphysema (ICD10-J43.9). Electronically Signed   By: Odessa Fleming M.D.   On: 08/11/2019 12:33   MR BRAIN W WO CONTRAST  Result Date: 08/11/2019 CLINICAL DATA:  Follow-up examination for acute intracranial hemorrhage, stroke. EXAM: MRI HEAD WITHOUT AND WITH CONTRAST TECHNIQUE: Multiplanar, multiecho pulse sequences of the brain and surrounding structures were obtained without and with intravenous contrast. CONTRAST:  5.42mL GADAVIST GADOBUTROL 1 MMOL/ML IV SOLN COMPARISON:  Prior CTs from earlier the same day. FINDINGS: Brain: Examination moderately to severely degraded by motion artifact. Diffuse prominence of the CSF containing spaces compatible with moderately advanced age-related cerebral atrophy. Patchy and confluent T2/FLAIR hyperintensity within the periventricular deep white matter both cerebral hemispheres most consistent with chronic small vessel ischemic disease, moderate nature. Scattered superimposed remote lacunar infarcts present about the bilateral basal ganglia and thalami. Patchy and confluent areas of restricted diffusion involving the parasagittal right temporal occipital region including the right hippocampal formation, consistent with an acute right PCA territory infarct. Patchy involvement of the right thalamus noted. No associated hemorrhage or mass effect. Previously identified parenchymal hemorrhage involving the inferior left cerebellum again seen, not significantly changed in size measuring 1.4 x 1.2 x 1.5 cm (series 10, image 5). Mild surrounding vasogenic edema without significant regional mass effect. Associated small volume subarachnoid hemorrhage within the adjacent posterior fossa, better appreciated on prior CT. No appreciable underlying mass  lesion, vascular abnormality, or abnormal enhancement, although evaluation degraded by motion. No other evidence for acute or subacute infarct. Gray-white matter differentiation otherwise maintained. No encephalomalacia to suggest chronic cortical infarction elsewhere within the brain. No other evidence for acute or chronic intracranial hemorrhage. No mass lesion, midline shift or mass effect. No hydrocephalus or extra-axial fluid collection. Pituitary gland grossly within normal limits. Midline structures intact. No other abnormal enhancement. Vascular: Major intracranial vascular flow voids are maintained. Skull and upper cervical spine: Craniocervical junction grossly within normal limits. Bone marrow signal intensity within normal limits. No scalp soft tissue abnormality. Sinuses/Orbits: Globes and orbital soft tissues within normal limits. Paranasal sinuses are largely clear. No mastoid effusion. Inner ear structures grossly normal. Other: None. IMPRESSION: 1. Technically limited exam due to extensive motion artifact. 2. No significant  interval change in size of small intraparenchymal hemorrhage involving the inferior left cerebellum. Surrounding mild vasogenic edema without significant regional mass effect. Associated small volume subarachnoid hemorrhage better evaluated on prior CT. No appreciable underlying mass lesion or other abnormality identified. 3. 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 4. Underlying moderately advanced cerebral atrophy with chronic microvascular ischemic disease, with multiple remote lacunar infarcts about the deep gray nuclei bilaterally. Electronically Signed   By: Rise Mu M.D.   On: 08/11/2019 00:30   DG Chest Port 1 View  Result Date: 08/10/2019 CLINICAL DATA:  COPD. EXAM: PORTABLE CHEST 1 VIEW COMPARISON:  Radiograph earlier this day at [facility] FINDINGS: The cardiomediastinal contours are  normal. Aortic atherosclerosis. Chronic interstitial coarsening and emphysema. Pulmonary vasculature is normal. No consolidation, pleural effusion, or pneumothorax. No acute osseous abnormalities are seen. IMPRESSION: 1. No acute abnormality. 2. Chronic interstitial coarsening and emphysema. Aortic Atherosclerosis (ICD10-I70.0). Electronically Signed   By: Narda Rutherford M.D.   On: 08/10/2019 23:31   DG Swallowing Func-Speech Pathology  Result Date: 08/12/2019 Objective Swallowing Evaluation: Type of Study: MBS-Modified Barium Swallow Study  Patient Details Name: VALEDA CORZINE MRN: [id number] Date of Birth: 02/12/1945 Today's Date: 08/12/2019 Time: SLP Start Time (ACUTE ONLY): 1345 -SLP Stop Time (ACUTE ONLY): 1407 SLP Time Calculation (min) (ACUTE ONLY): 22 min Past Medical History: Past Medical History: Diagnosis Date  COPD (chronic obstructive pulmonary disease) (HCC)   High cholesterol   On home oxygen therapy   "2L; all the time" (04/06/2016)  Pneumonia   "when I was a kid"   Urinary hesitancy  Past Surgical History: Past Surgical History: Procedure Laterality Date  ABDOMINAL AORTOGRAM N/A 04/08/2016  Procedure: Abdominal Aortogram;  Surgeon: Sherren Kerns, MD;  Location: [facility] INVASIVE CV LAB;  Service: Cardiovascular;  Laterality: N/A;  CARDIAC CATHETERIZATION N/A 11/28/2014  Procedure: Right/Left Heart Cath and Coronary Angiography;  Surgeon: [name], MD;  Location: [facility] INVASIVE CV LAB;  Service: Cardiovascular;  Laterality: N/A;  CARDIAC CATHETERIZATION  08/2006  Hattie Perch 08/25/2006  DRESSING CHANGE UNDER ANESTHESIA Left 04/12/2016  Procedure: DRESSING CHANGE LEFT HIP AND LEFT HEEL;  Surgeon: Fransisco Hertz, MD;  Location: [facility] OR;  Service: Vascular;  Laterality: Left;  FEMORAL-POPLITEAL BYPASS GRAFT Left 04/12/2016  Procedure: LEFT FEMORAL-POPITEAL  BYPASS GRAFT;  Surgeon: Fransisco Hertz, MD;  Location: [facility] OR;  Service: Vascular;  Laterality: Left;  JOINT REPLACEMENT    LOWER EXTREMITY ANGIOGRAPHY Bilateral  04/08/2016  Procedure: Lower Extremity Angiography;  Surgeon: Sherren Kerns, MD;  Location: [facility] INVASIVE CV LAB;  Service: Cardiovascular;  Laterality: Bilateral;  PERIPHERAL VASCULAR INTERVENTION Bilateral 04/08/2016  Procedure: Peripheral Vascular Intervention;  Surgeon: Sherren Kerns, MD;  Location: [facility] INVASIVE CV LAB;  Service: Cardiovascular;  Laterality: Bilateral;  common iliac  TOTAL HIP ARTHROPLASTY Left 01/29/2016  VEIN HARVEST Left 04/12/2016  Procedure: LEFT GREATER SAPHENOUS VEIN HARVEST;  Surgeon: Fransisco Hertz, MD;  Location: [facility] OR;  Service: Vascular;  Laterality: Left; HPI: 74 y.o. female past medical history of advanced COPD, hypercholesterolemia, atrial fibrillation not on anticoagulation, presents to the emergency room as a transfer from [facility] for evaluation of cerebellar bleed. CT head demonstrates acute hemorrhage within the paramedian inferior left cerebellar hemisphere with adjacent small volume acute subarachnoid hemorrhage-likely extension of some subarachnoid bleed into the fourth ventricle.  Subjective: upright, cooperative, needs cues Assessment / Plan / Recommendation CHL IP CLINICAL IMPRESSIONS 08/12/2019 Clinical Impression Pt has a mild  oropharyngeal dysphagia due to impaired coordination, timing, and oral control. She does not fully masticate solid foods, although she does clear the pieces of food that aren't chewed. There is reduced posterior transit and decreased bolus cohesion with varying amounts of mild-moderate lingual residue across different boluses. She spontaneously pushes oral residue back to her pharynx but then it stays int he valleculae without triggering a second swallow. Boluses also spill back into the pharynx as she is trying to perform her initial swallow. Impaired timing as her pyriform sinuses begin to fill with thin liquids results in aspiration with inconsistent sensation. This is not improved with cup vs straw sips or with use of chin tuck.  Recommend Dys 3 diet and nectar thick liquids.  SLP Visit Diagnosis Dysphagia, oropharyngeal phase (R13.12) Attention and concentration deficit following -- Frontal lobe and executive function deficit following -- Impact on safety and function Mild aspiration risk   CHL IP TREATMENT RECOMMENDATION 08/12/2019 Treatment Recommendations Therapy as outlined in treatment plan below   Prognosis 08/12/2019 Prognosis for Safe Diet Advancement Good Barriers to Reach Goals Cognitive deficits Barriers/Prognosis Comment -- CHL IP DIET RECOMMENDATION 08/12/2019 SLP Diet Recommendations Dysphagia 3 (Mech soft) solids;Nectar thick liquid Liquid Administration via Cup;Straw Medication Administration Whole meds with puree Compensations Slow rate;Small sips/bites Postural Changes Seated upright at 90 degrees   CHL IP OTHER RECOMMENDATIONS 08/12/2019 Recommended Consults -- Oral Care Recommendations Oral care BID Other Recommendations Order thickener from pharmacy;Prohibited food (jello, ice cream, thin soups);Remove water pitcher   CHL IP FOLLOW UP RECOMMENDATIONS 08/12/2019 Follow up Recommendations Inpatient Rehab   CHL IP FREQUENCY AND DURATION 08/12/2019 Speech Therapy Frequency (ACUTE ONLY) min 2x/week Treatment Duration 2 weeks      CHL IP ORAL PHASE 08/12/2019 Oral Phase Impaired Oral - Pudding Teaspoon -- Oral - Pudding Cup -- Oral - Honey Teaspoon -- Oral - Honey Cup -- Oral - Nectar Teaspoon -- Oral - Nectar Cup Reduced posterior propulsion;Lingual/palatal residue;Delayed oral transit;Decreased bolus cohesion Oral - Nectar Straw Reduced posterior propulsion;Lingual/palatal residue;Delayed oral transit;Decreased bolus cohesion Oral - Thin Teaspoon -- Oral - Thin Cup Reduced posterior propulsion;Lingual/palatal residue;Delayed oral transit;Decreased bolus cohesion;Premature spillage Oral - Thin Straw Reduced posterior propulsion;Lingual/palatal residue;Delayed oral transit;Decreased bolus cohesion;Premature spillage Oral - Puree Reduced  posterior propulsion;Lingual/palatal residue;Delayed oral transit;Decreased bolus cohesion Oral - Mech Soft Reduced posterior propulsion;Lingual/palatal residue;Delayed oral transit;Decreased bolus cohesion;Impaired mastication Oral - Regular -- Oral - Multi-Consistency -- Oral - Pill -- Oral Phase - Comment --  CHL IP PHARYNGEAL PHASE 08/12/2019 Pharyngeal Phase Impaired Pharyngeal- Pudding Teaspoon -- Pharyngeal -- Pharyngeal- Pudding Cup -- Pharyngeal -- Pharyngeal- Honey Teaspoon -- Pharyngeal -- Pharyngeal- Honey Cup -- Pharyngeal -- Pharyngeal- Nectar Teaspoon -- Pharyngeal -- Pharyngeal- Nectar Cup Delayed swallow initiation-vallecula Pharyngeal -- Pharyngeal- Nectar Straw Delayed swallow initiation-vallecula Pharyngeal -- Pharyngeal- Thin Teaspoon -- Pharyngeal -- Pharyngeal- Thin Cup Delayed swallow initiation-pyriform sinuses;Penetration/Aspiration before swallow Pharyngeal Material enters airway, passes BELOW cords without attempt by patient to eject out (silent aspiration);Material enters airway, passes BELOW cords and not ejected out despite cough attempt by patient Pharyngeal- Thin Straw Delayed swallow initiation-pyriform sinuses;Penetration/Aspiration before swallow Pharyngeal Material enters airway, passes BELOW cords without attempt by patient to eject out (silent aspiration);Material enters airway, passes BELOW cords and not ejected out despite cough attempt by patient Pharyngeal- Puree Delayed swallow initiation-vallecula Pharyngeal -- Pharyngeal- Mechanical Soft Delayed swallow initiation-vallecula Pharyngeal -- Pharyngeal- Regular -- Pharyngeal -- Pharyngeal- Multi-consistency -- Pharyngeal -- Pharyngeal- Pill -- Pharyngeal -- Pharyngeal Comment --  CHL IP CERVICAL  ESOPHAGEAL PHASE 08/12/2019 Cervical Esophageal Phase WFL Pudding Teaspoon -- Pudding Cup -- Honey Teaspoon -- Honey Cup -- Nectar Teaspoon -- Nectar Cup -- Nectar Straw -- Thin Teaspoon -- Thin Cup -- Thin Straw -- Puree -- Mechanical  Soft -- Regular -- Multi-consistency -- Pill -- Cervical Esophageal Comment -- Mahala Menghini., M.A. CCC-SLP Acute Rehabilitation Services Pager [phone] Office [phone] 08/12/2019, 3:02 PM                  HISTORY OF PRESENT ILLNESS ADAEZE BETTER is a 74 y.o. female with past medical history of advanced COPD, hypercholesterolemia, atrial fibrillation not on anticoagulation, presents to the emergency room as a transfer from [facility] for evaluation of cerebellar bleed.  She presented to the ED at [facility] with left-sided weakness and numbness this morning.  EMS reported that she had gone to the bathroom in her usual state of health but was having difficulty getting out of the bathroom and family noticed that she was not moving the left side of her body normally.  She seemed to be staring towards the left and was having difficulty walking as well.  She has not been compliant with any of her medications.  Patient reported some numbness on the left side.  No slurred speech or facial droop was reported at that time. CT head showed an acute hemorrhage within the paramedian inferior left cerebellar hemisphere with adjacent small volume acute subarachnoid hemorrhage-likely extension of some subarachnoid bleed into the fourth ventricle.  Further recommendations of follow-up with MRI were provided. Neurosurgery was consulted over the phone and patient was transferred to the [facility] ED. Neurosurgery reviewed patient's case and recommended neurological consultation and admission. Patient is not a very good historian. Very unclear about the last known well-sometime last night 08/09/2019 probably. Premorbid modified Rankin scale (mRS):2-mostly from COPD. ICH Score: 2-infratentorial, IVH.  HOSPITAL COURSE Ms. AMRI LIEN is a 74 y.o. female with history of advanced COPD (home O2), hypercholesterolemia, ASPVD, non compliance with medications, atrial fibrillation  not on anticoagulation, transfered from [facility] for evaluation of cerebellar bleed having initially presented there with left sided weakness / numbness.     Stroke: R posterior cerebral artery ischemic infarct, L cerebellar ICH w/ SAH (likely Hemorrhagic transformation of ischemic infarct as well in setting of hypertensive emergency), Infarcts embolic d/t known AF not on AC CT head - Redemonstrated IPH paramedian inferior left cerebellum with SAH slightly redistributed stable. Atrophy. chronic microvascular angiopathy. Remote bilateral basal ganglia lacunes. MRI Brain W / WO - stable inferior L cerebellar ICH. 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 Infundibulum L SCA origin. Severe atherosclerosis head and neck (severe L VA, high-grade L ICA distal bulb w/ string sign, R ICA 65% stenosis soft plaque vs web, mild to moderate R>L ICA siphon, severe proximal R PCA, moderate R V4, moderate R A1, severe R M2 superior division). Aortic atherosclerosis. Emphysema 2D Echo - pending - called vascular lab to have study read    LDL - 168 HgbA1c - 5.6 UDS - opiates VTE prophylaxis - Lovenox 40 mg sq daily aspirin 81 mg daily (not taking plavix) prior to admission, now on No antithrombotic given hemorrhage. Add aspirin 81 mg daily in 1 week if stable. Need to consider eliquis prior to d/c from rehab given AF if hemorrhage resolved Therapy recommendations:  CIR Disposition:  CIR   Atrial Fibrillation Home  anticoagulation:  none  Not an AC candidate d/t hemorrhage  Need to consider eliquis prior to d/c given AF if hemorrhage resolved -  Confirm hemorrhage resolution with imaging prior to starting Eliquis. Call stroke team for assistance w/ read as needed.    Hypertensive Emergency BP as high as 214/193 Home BP meds: Coreg (not taking) SBP goal < 180 mg Hg  On coreg 12.5 bid and norvasc 5 Treated with cleviprex now off Long-term BP goal normotensive    Hyperlipidemia Home Lipid lowering medication: Lipitor 20 mg daily LDL 168, goal < 70 Current lipid lowering medication: None (statin contraindicated with ICH) Add statin at discharge - lipitor 80   Other Stroke Risk Factors Advanced age Former cigarette smoker - quit UDS - opiates, advised to stop using Remote infarcts by CT   UTI present PTA w/ Urinary Retention & Hesitancy Temp 99 - WBCs - 18.2->11.3->7.8 blood cultures - no growth 1 d I&O cathed x 4 for ~300cc each time Bolus x 1 On IVF Hold foley.  Urine Cx given hesitancy and urgency >100k e coli. Sensitivity pending   Rocephin 7/5>>7/8 (3)   Other Active Problems Aortic Atherosclerosis (ICD10-I70.0) COPD - O2 dependent at home Hypokalemia - 3.3 -> supplement - > 4.0->3.4 - supplement  CKD - stage 3a - creatinine - 1.08->0.96->0.98 Hyperglycemia - 175 ->110->103 (HgBA1C - 5.6)    DISCHARGE EXAM Blood pressure 139/61, pulse 65, temperature 98.5 F (36.9 C), temperature source Oral, resp. rate 14, height 5\' 2"  (1.575 m), weight 60.9 kg, SpO2 100 %. Pleasant elderly lady not in distress. Afebrile. Head is nontraumatic. Neck is supple without bruit. Cardiac exam no murmur or gallop. Lungs are clear to auscultation. Distal pulses are well felt. Neurological Exam  Awake alert oriented to time place and person.  Speech is normal without aphasia apraxia or dysarthria.  Extraocular movements are full range without nystagmus.  Face is symmetric without weakness.  She has dense left homonymous hemianopsia.  Tongue midline.  Motor system exam shows symmetric upper and lower extremity strength except mild weakness of left grip and intrinsic hand muscles.  Fine finger movements are diminished on the left.  Orbits right over left upper extremity  .  Gait not tested  Discharge Diet  Dysphagia 3 nectar thick liquids  DISCHARGE PLAN Disposition:  Transfer to [facility] for ongoing PT, OT and ST Add aspirin 81 mg daily  for secondary stroke prevention in 1 week. Need to consider eliquis prior to d/c given AF if hemorrhage resolved. Confirm hemorrhage resolution with imaging prior to starting Eliquis. Call stroke team for assistance w/ read as needed.  Recommend ongoing stroke risk factor control by Primary Care Physician at time of discharge from inpatient rehabilitation. Follow-up PCP [facility], MD in 2 weeks following discharge from rehab. Follow-up in [facility] in 4 weeks following discharge from rehab, office to schedule an appointment.  Follow up urine culture sensitivity  35 minutes were spent preparing discharge.  [name], MSN, APRN, ANVP-BC, AGPCNP-BC Advanced Practice Stroke Nurse [facility] See Amion for Schedule & Pager information 08/13/2019 2:05 PM  I have personally obtained history,examined this patient, reviewed notes, independently viewed imaging studies, participated in medical decision making and plan of care.ROS completed by me personally and pertinent positives fully documented  I have made any additions or clarifications directly to the above note. Agree with note above.    10/14/2019, MD Medical Director [facility] Pager: [phone] 08/13/2019 5:12 PM

## 2019-08-14 ENCOUNTER — Inpatient Hospital Stay (HOSPITAL_COMMUNITY): Payer: Medicare Other

## 2019-08-14 ENCOUNTER — Inpatient Hospital Stay (HOSPITAL_COMMUNITY): Payer: Medicare Other | Admitting: Speech Pathology

## 2019-08-14 ENCOUNTER — Inpatient Hospital Stay (HOSPITAL_COMMUNITY): Payer: Medicare Other | Admitting: Physical Therapy

## 2019-08-14 ENCOUNTER — Inpatient Hospital Stay (HOSPITAL_COMMUNITY): Payer: Medicare Other | Admitting: Occupational Therapy

## 2019-08-14 ENCOUNTER — Inpatient Hospital Stay (HOSPITAL_COMMUNITY)
Admission: RE | Admit: 2019-08-14 | Discharge: 2019-08-19 | DRG: 189 | Disposition: A | Discharge status: Rehabilitation Facility | Payer: Medicare Other | Source: Intra-hospital | Attending: Family Medicine | Admitting: Family Medicine

## 2019-08-14 DIAGNOSIS — I69118 Other symptoms and signs involving cognitive functions following nontraumatic intracerebral hemorrhage: Secondary | ICD-10-CM | POA: Diagnosis not present

## 2019-08-14 DIAGNOSIS — I480 Paroxysmal atrial fibrillation: Secondary | ICD-10-CM | POA: Diagnosis present

## 2019-08-14 DIAGNOSIS — I69154 Hemiplegia and hemiparesis following nontraumatic intracerebral hemorrhage affecting left non-dominant side: Secondary | ICD-10-CM | POA: Diagnosis present

## 2019-08-14 DIAGNOSIS — Z8249 Family history of ischemic heart disease and other diseases of the circulatory system: Secondary | ICD-10-CM | POA: Diagnosis not present

## 2019-08-14 DIAGNOSIS — I69391 Dysphagia following cerebral infarction: Secondary | ICD-10-CM | POA: Diagnosis not present

## 2019-08-14 DIAGNOSIS — E8770 Fluid overload, unspecified: Secondary | ICD-10-CM | POA: Diagnosis present

## 2019-08-14 DIAGNOSIS — J9621 Acute and chronic respiratory failure with hypoxia: Secondary | ICD-10-CM | POA: Diagnosis present

## 2019-08-14 DIAGNOSIS — I639 Cerebral infarction, unspecified: Secondary | ICD-10-CM | POA: Diagnosis present

## 2019-08-14 DIAGNOSIS — I5021 Acute systolic (congestive) heart failure: Secondary | ICD-10-CM | POA: Diagnosis not present

## 2019-08-14 DIAGNOSIS — E785 Hyperlipidemia, unspecified: Secondary | ICD-10-CM | POA: Diagnosis present

## 2019-08-14 DIAGNOSIS — I6310 Cerebral infarction due to embolism of unspecified precerebral artery: Secondary | ICD-10-CM | POA: Diagnosis not present

## 2019-08-14 DIAGNOSIS — B9620 Unspecified Escherichia coli [E. coli] as the cause of diseases classified elsewhere: Secondary | ICD-10-CM | POA: Diagnosis present

## 2019-08-14 DIAGNOSIS — Z96642 Presence of left artificial hip joint: Secondary | ICD-10-CM | POA: Diagnosis present

## 2019-08-14 DIAGNOSIS — N390 Urinary tract infection, site not specified: Secondary | ICD-10-CM | POA: Diagnosis present

## 2019-08-14 DIAGNOSIS — F419 Anxiety disorder, unspecified: Secondary | ICD-10-CM | POA: Diagnosis present

## 2019-08-14 DIAGNOSIS — I10 Essential (primary) hypertension: Secondary | ICD-10-CM | POA: Diagnosis present

## 2019-08-14 DIAGNOSIS — I471 Supraventricular tachycardia: Secondary | ICD-10-CM | POA: Diagnosis not present

## 2019-08-14 DIAGNOSIS — R1312 Dysphagia, oropharyngeal phase: Secondary | ICD-10-CM | POA: Diagnosis present

## 2019-08-14 DIAGNOSIS — I248 Other forms of acute ischemic heart disease: Secondary | ICD-10-CM | POA: Diagnosis present

## 2019-08-14 DIAGNOSIS — I4891 Unspecified atrial fibrillation: Secondary | ICD-10-CM | POA: Diagnosis present

## 2019-08-14 DIAGNOSIS — I614 Nontraumatic intracerebral hemorrhage in cerebellum: Secondary | ICD-10-CM | POA: Diagnosis not present

## 2019-08-14 DIAGNOSIS — R3981 Functional urinary incontinence: Secondary | ICD-10-CM | POA: Diagnosis present

## 2019-08-14 DIAGNOSIS — I161 Hypertensive emergency: Secondary | ICD-10-CM | POA: Diagnosis present

## 2019-08-14 DIAGNOSIS — R159 Full incontinence of feces: Secondary | ICD-10-CM | POA: Diagnosis present

## 2019-08-14 DIAGNOSIS — G8929 Other chronic pain: Secondary | ICD-10-CM | POA: Diagnosis present

## 2019-08-14 DIAGNOSIS — E222 Syndrome of inappropriate secretion of antidiuretic hormone: Secondary | ICD-10-CM | POA: Diagnosis present

## 2019-08-14 DIAGNOSIS — R4182 Altered mental status, unspecified: Secondary | ICD-10-CM | POA: Diagnosis present

## 2019-08-14 DIAGNOSIS — I6340 Cerebral infarction due to embolism of unspecified cerebral artery: Secondary | ICD-10-CM | POA: Diagnosis present

## 2019-08-14 DIAGNOSIS — R739 Hyperglycemia, unspecified: Secondary | ICD-10-CM

## 2019-08-14 DIAGNOSIS — I472 Ventricular tachycardia: Secondary | ICD-10-CM | POA: Diagnosis not present

## 2019-08-14 DIAGNOSIS — R0682 Tachypnea, not elsewhere classified: Secondary | ICD-10-CM | POA: Diagnosis present

## 2019-08-14 DIAGNOSIS — I69190 Apraxia following nontraumatic intracerebral hemorrhage: Secondary | ICD-10-CM | POA: Diagnosis not present

## 2019-08-14 DIAGNOSIS — I169 Hypertensive crisis, unspecified: Secondary | ICD-10-CM | POA: Diagnosis not present

## 2019-08-14 DIAGNOSIS — Z79899 Other long term (current) drug therapy: Secondary | ICD-10-CM

## 2019-08-14 DIAGNOSIS — R339 Retention of urine, unspecified: Secondary | ICD-10-CM | POA: Diagnosis present

## 2019-08-14 DIAGNOSIS — J449 Chronic obstructive pulmonary disease, unspecified: Secondary | ICD-10-CM | POA: Diagnosis present

## 2019-08-14 DIAGNOSIS — D62 Acute posthemorrhagic anemia: Secondary | ICD-10-CM | POA: Diagnosis not present

## 2019-08-14 DIAGNOSIS — J810 Acute pulmonary edema: Secondary | ICD-10-CM | POA: Diagnosis present

## 2019-08-14 DIAGNOSIS — I63431 Cerebral infarction due to embolism of right posterior cerebral artery: Secondary | ICD-10-CM | POA: Diagnosis present

## 2019-08-14 DIAGNOSIS — E440 Moderate protein-calorie malnutrition: Secondary | ICD-10-CM | POA: Diagnosis present

## 2019-08-14 DIAGNOSIS — R944 Abnormal results of kidney function studies: Secondary | ICD-10-CM | POA: Diagnosis present

## 2019-08-14 DIAGNOSIS — I739 Peripheral vascular disease, unspecified: Secondary | ICD-10-CM | POA: Diagnosis present

## 2019-08-14 DIAGNOSIS — Z9981 Dependence on supplemental oxygen: Secondary | ICD-10-CM

## 2019-08-14 DIAGNOSIS — I69354 Hemiplegia and hemiparesis following cerebral infarction affecting left non-dominant side: Secondary | ICD-10-CM | POA: Diagnosis not present

## 2019-08-14 DIAGNOSIS — I214 Non-ST elevation (NSTEMI) myocardial infarction: Secondary | ICD-10-CM | POA: Diagnosis present

## 2019-08-14 DIAGNOSIS — Z87891 Personal history of nicotine dependence: Secondary | ICD-10-CM | POA: Diagnosis not present

## 2019-08-14 DIAGNOSIS — I69198 Other sequelae of nontraumatic intracerebral hemorrhage: Secondary | ICD-10-CM | POA: Diagnosis not present

## 2019-08-14 DIAGNOSIS — Z95828 Presence of other vascular implants and grafts: Secondary | ICD-10-CM | POA: Diagnosis not present

## 2019-08-14 DIAGNOSIS — Z7982 Long term (current) use of aspirin: Secondary | ICD-10-CM

## 2019-08-14 LAB — TROPONIN I (HIGH SENSITIVITY)
Troponin I (High Sensitivity): 389 ng/L (ref <18)
Troponin I (High Sensitivity): 848 ng/L (ref <18)
Troponin I (High Sensitivity): 920 ng/L (ref <18)

## 2019-08-14 LAB — BLOOD GAS, ARTERIAL
Acid-base deficit: 0.8 mmol/L (ref 0.0–2.0)
Bicarbonate: 24.6 mmol/L (ref 20.0–28.0)
Drawn by: 560371
FIO2: 60
O2 Saturation: 98.6 %
Patient temperature: 36.4
pCO2 arterial: 48.6 mmHg — ABNORMAL HIGH (ref 32.0–48.0)
pH, Arterial: 7.321 — ABNORMAL LOW (ref 7.350–7.450)
pO2, Arterial: 129 mmHg — ABNORMAL HIGH (ref 83.0–108.0)

## 2019-08-14 LAB — CBC WITH DIFFERENTIAL/PLATELET
Abs Immature Granulocytes: 0.23 10*3/uL — ABNORMAL HIGH (ref 0.00–0.07)
Basophils Absolute: 0.1 10*3/uL (ref 0.0–0.1)
Basophils Relative: 0 %
Eosinophils Absolute: 0 10*3/uL (ref 0.0–0.5)
Eosinophils Relative: 0 %
HCT: 40.5 % (ref 36.0–46.0)
Hemoglobin: 12.8 g/dL (ref 12.0–15.0)
Immature Granulocytes: 1 %
Lymphocytes Relative: 4 %
Lymphs Abs: 0.8 10*3/uL (ref 0.7–4.0)
MCH: 28.7 pg (ref 26.0–34.0)
MCHC: 31.6 g/dL (ref 30.0–36.0)
MCV: 90.8 fL (ref 80.0–100.0)
Monocytes Absolute: 1.3 10*3/uL — ABNORMAL HIGH (ref 0.1–1.0)
Monocytes Relative: 6 %
Neutro Abs: 19.3 10*3/uL — ABNORMAL HIGH (ref 1.7–7.7)
Neutrophils Relative %: 89 %
Platelets: 261 10*3/uL (ref 150–400)
RBC: 4.46 MIL/uL (ref 3.87–5.11)
RDW: 13.5 % (ref 11.5–15.5)
WBC: 21.8 10*3/uL — ABNORMAL HIGH (ref 4.0–10.5)
nRBC: 0 % (ref 0.0–0.2)

## 2019-08-14 LAB — URINE CULTURE: Culture: >=100000 — AB

## 2019-08-14 LAB — COMPREHENSIVE METABOLIC PANEL
ALT: 15 U/L (ref 0–44)
AST: 37 U/L (ref 15–41)
Albumin: 3 g/dL — ABNORMAL LOW (ref 3.5–5.0)
Alkaline Phosphatase: 88 U/L (ref 38–126)
Anion gap: 13 (ref 5–15)
BUN: 11 mg/dL (ref 8–23)
CO2: 24 mmol/L (ref 22–32)
Calcium: 8.6 mg/dL — ABNORMAL LOW (ref 8.9–10.3)
Chloride: 104 mmol/L (ref 98–111)
Creatinine, Ser: 1.09 mg/dL — ABNORMAL HIGH (ref 0.44–1.00)
GFR calc Af Amer: 58 mL/min — ABNORMAL LOW (ref >60)
GFR calc non Af Amer: 50 mL/min — ABNORMAL LOW (ref >60)
Glucose, Bld: 137 mg/dL — ABNORMAL HIGH (ref 70–99)
Potassium: 5.1 mmol/L (ref 3.5–5.1)
Sodium: 141 mmol/L (ref 135–145)
Total Bilirubin: 0.9 mg/dL (ref 0.3–1.2)
Total Protein: 6.8 g/dL (ref 6.5–8.1)

## 2019-08-14 LAB — GLUCOSE, CAPILLARY
Glucose-Capillary: 185 mg/dL — ABNORMAL HIGH (ref 70–99)
Glucose-Capillary: 80 mg/dL (ref 70–99)
Glucose-Capillary: 91 mg/dL (ref 70–99)
Glucose-Capillary: 79 mg/dL (ref 70–99)

## 2019-08-14 LAB — MAGNESIUM: Magnesium: 2 mg/dL (ref 1.7–2.4)

## 2019-08-14 MED ORDER — IPRATROPIUM-ALBUTEROL 0.5-2.5 (3) MG/3ML IN SOLN
3.0000 mL | RESPIRATORY_TRACT | Status: DC
  Administered 2019-08-14 (×2): 3 mL via RESPIRATORY_TRACT
  Filled 2019-08-14: qty 3

## 2019-08-14 MED ORDER — ONDANSETRON HCL 4 MG PO TABS
4.0000 mg | ORAL_TABLET | Freq: Four times a day (QID) | ORAL | Status: DC | PRN
  Administered 2019-08-17: 4 mg via ORAL
  Filled 2019-08-14: qty 1

## 2019-08-14 MED ORDER — AMLODIPINE BESYLATE 5 MG PO TABS: 5.0000 mg | ORAL_TABLET | Freq: Every day | ORAL | Status: DC

## 2019-08-14 MED ORDER — CARVEDILOL 12.5 MG PO TABS
12.5000 mg | ORAL_TABLET | Freq: Two times a day (BID) | ORAL | Status: DC
  Administered 2019-08-15 – 2019-08-19 (×9): 12.5 mg via ORAL
  Filled 2019-08-14 (×9): qty 1

## 2019-08-14 MED ORDER — ACETAMINOPHEN 650 MG RE SUPP: 650.0000 mg | Freq: Four times a day (QID) | RECTAL | Status: DC | PRN

## 2019-08-14 MED ORDER — POLYETHYLENE GLYCOL 3350 17 G PO PACK: 17.0000 g | PACK | Freq: Every day | ORAL | Status: DC | PRN

## 2019-08-14 MED ORDER — IPRATROPIUM-ALBUTEROL 0.5-2.5 (3) MG/3ML IN SOLN
RESPIRATORY_TRACT | Status: AC
  Administered 2019-08-14: 3 mL via RESPIRATORY_TRACT
  Filled 2019-08-14: qty 3

## 2019-08-14 MED ORDER — SODIUM CHLORIDE 0.9 % IV SOLN
1.0000 g | INTRAVENOUS | Status: DC
  Administered 2019-08-14 – 2019-08-15 (×2): 1 g via INTRAVENOUS
  Filled 2019-08-14 (×3): qty 10

## 2019-08-14 MED ORDER — LABETALOL HCL 5 MG/ML IV SOLN
10.0000 mg | Freq: Once | INTRAVENOUS | Status: AC
  Administered 2019-08-14: 10 mg via INTRAVENOUS
  Filled 2019-08-14: qty 4

## 2019-08-14 MED ORDER — HYDRALAZINE HCL 20 MG/ML IJ SOLN
5.0000 mg | INTRAMUSCULAR | Status: DC | PRN
  Administered 2019-08-14 (×2): 5 mg via INTRAVENOUS
  Filled 2019-08-14 (×2): qty 1

## 2019-08-14 MED ORDER — INSULIN ASPART 100 UNIT/ML ~~LOC~~ SOLN
0.0000 [IU] | Freq: Three times a day (TID) | SUBCUTANEOUS | Status: DC
  Administered 2019-08-15: 1 [IU] via SUBCUTANEOUS
  Administered 2019-08-15: 2 [IU] via SUBCUTANEOUS
  Administered 2019-08-17 – 2019-08-18 (×3): 1 [IU] via SUBCUTANEOUS

## 2019-08-14 MED ORDER — POTASSIUM CHLORIDE 10 MEQ/100ML IV SOLN
10.0000 meq | INTRAVENOUS | Status: AC
  Administered 2019-08-14: 10 meq via INTRAVENOUS
  Filled 2019-08-14 (×2): qty 100

## 2019-08-14 MED ORDER — ATORVASTATIN CALCIUM 80 MG PO TABS
80.0000 mg | ORAL_TABLET | Freq: Every day | ORAL | Status: DC
  Administered 2019-08-15 – 2019-08-19 (×5): 80 mg via ORAL
  Filled 2019-08-14 (×5): qty 1

## 2019-08-14 MED ORDER — SENNOSIDES-DOCUSATE SODIUM 8.6-50 MG PO TABS
1.0000 | ORAL_TABLET | Freq: Two times a day (BID) | ORAL | Status: DC
  Administered 2019-08-15 – 2019-08-19 (×7): 1 via ORAL
  Filled 2019-08-14 (×10): qty 1

## 2019-08-14 MED ORDER — SODIUM CHLORIDE 0.9 % IV SOLN: INTRAVENOUS | Status: DC

## 2019-08-14 MED ORDER — SODIUM CHLORIDE 0.9 % IV SOLN
1.0000 g | INTRAVENOUS | Status: DC
  Filled 2019-08-14: qty 10

## 2019-08-14 MED ORDER — ACETAMINOPHEN 325 MG PO TABS
650.0000 mg | ORAL_TABLET | Freq: Four times a day (QID) | ORAL | Status: DC | PRN
  Administered 2019-08-17: 650 mg via ORAL
  Filled 2019-08-14 (×2): qty 2

## 2019-08-14 MED ORDER — BISACODYL 5 MG PO TBEC: 5.0000 mg | DELAYED_RELEASE_TABLET | Freq: Every day | ORAL | Status: DC | PRN

## 2019-08-14 MED ORDER — ONDANSETRON HCL 4 MG/2ML IJ SOLN
4.0000 mg | Freq: Four times a day (QID) | INTRAMUSCULAR | Status: DC | PRN
  Administered 2019-08-14 – 2019-08-19 (×4): 4 mg via INTRAVENOUS
  Filled 2019-08-14 (×4): qty 2

## 2019-08-14 MED ORDER — STROKE: EARLY STAGES OF RECOVERY BOOK
Freq: Once | Status: AC
  Filled 2019-08-14: qty 1

## 2019-08-14 MED ORDER — RESOURCE THICKENUP CLEAR PO POWD
1.0000 | ORAL | Status: DC | PRN
  Filled 2019-08-14: qty 125

## 2019-08-14 MED ORDER — NITROGLYCERIN 0.4 MG/HR TD PT24: 0.4000 mg | MEDICATED_PATCH | Freq: Every day | TRANSDERMAL | Status: DC

## 2019-08-14 MED ORDER — ALBUTEROL SULFATE (2.5 MG/3ML) 0.083% IN NEBU: 3.0000 mL | INHALATION_SOLUTION | Freq: Four times a day (QID) | RESPIRATORY_TRACT | Status: DC | PRN

## 2019-08-14 MED ORDER — NITROGLYCERIN 0.4 MG SL SUBL
SUBLINGUAL_TABLET | SUBLINGUAL | Status: AC
  Filled 2019-08-14: qty 1

## 2019-08-14 MED ORDER — MORPHINE SULFATE (PF) 2 MG/ML IV SOLN
2.0000 mg | INTRAVENOUS | Status: DC | PRN
  Administered 2019-08-14: 2 mg via INTRAVENOUS
  Filled 2019-08-14: qty 1

## 2019-08-14 MED ORDER — FUROSEMIDE 10 MG/ML IJ SOLN
40.0000 mg | Freq: Once | INTRAMUSCULAR | Status: AC
  Administered 2019-08-14: 40 mg via INTRAVENOUS
  Filled 2019-08-14: qty 4

## 2019-08-14 MED ORDER — ZOLPIDEM TARTRATE 5 MG PO TABS
5.0000 mg | ORAL_TABLET | Freq: Every evening | ORAL | Status: DC | PRN
  Administered 2019-08-16 – 2019-08-18 (×2): 5 mg via ORAL
  Filled 2019-08-14 (×2): qty 1

## 2019-08-14 MED ORDER — NITROGLYCERIN 0.4 MG/HR TD PT24
0.4000 mg | MEDICATED_PATCH | Freq: Every day | TRANSDERMAL | Status: DC
  Administered 2019-08-14: 0.4 mg via TRANSDERMAL
  Filled 2019-08-14: qty 1

## 2019-08-14 MED ORDER — HYDROCODONE-ACETAMINOPHEN 5-325 MG PO TABS
1.0000 | ORAL_TABLET | ORAL | Status: DC | PRN
  Administered 2019-08-15 (×2): 1 via ORAL
  Administered 2019-08-15: 2 via ORAL
  Administered 2019-08-16 – 2019-08-17 (×2): 1 via ORAL
  Administered 2019-08-18 (×2): 2 via ORAL
  Filled 2019-08-14 (×2): qty 2
  Filled 2019-08-14 (×3): qty 1
  Filled 2019-08-14: qty 2
  Filled 2019-08-14 (×2): qty 1

## 2019-08-14 NOTE — Significant Event (Addendum)
Rapid Response Event Note  Overview: Acute desaturation with increased WOB  Initial Focused Assessment: Notified by nursing staff of pt with desaturations to 81% on 3LNC. Upon arrival, pt is arousable to voice. Moderate audible secretions with moderate abdominal accessory muscle use. RR 30. Skin warm, pink and diaphoretic. Pt was immediately placed on Salter HFNC at 10L and sats increased to 93%. BBS Coarse Rhonchi with diminished bases.   0415-97.36F rectal, HR 80, 171/58, RR 24 sats 100% on 10L HFNC. Pt is oriented to self and place. She is denying SOB however still has increased WOB with abdominal accessory muscle use and forced exhalation.   Interventions: -Salter HFNC -Stat PCXR- shows pulm edema pattern -Stat ABG (7.32/48.6/129/25)  Plan of Care (if not transferred): -Recommend diuresis -Obtain rectal temperature -Notify primary svc of events and results of PCXR for further orders -May escalate to BIPAP if needed -Pt may require transfer to inpatient medical bed if WOB not improved.  -VS q1 x2 and then back to routine unless clinical decompensation  Event Summary: Call received [phone] Arrived at call [phone] Call ended [phone]  Addendum: 0610- Lasix given. RR 21 wth sats 99% on 8 LNC. Awaiting PCU assignment.   Rose Fillers

## 2019-08-14 NOTE — Progress Notes (Signed)
Patient ID: Caitlin Vasquez, female   DOB: 1945/10/08, 74 y.o.   MRN: [id number]   SW called son to introduce self and provide information. No answer, will follow up

## 2019-08-14 NOTE — Progress Notes (Signed)
NT and RN alerted charge at 0401 of pt SOB and clammy when provideing pt care. Pt sats w3ere 75% on 3L, raised to 4 L and at 90%. Rapid and RT called and verbal order to place pt on 6L. On call provider notified and order for CBG place, blood sugar was 185. Rapid and RT arrived, pt placed on 10L HFNC, sats at 94%. Pt's vitals were 97.9 rectal temp, 151/61, HR 78, RR 32, 94% sats. Blood gas and CXR ordered. Pt wet lungs and audible wheezing. New IV placed by rapid. Night provider to start IV lasix, however pt potasium is below parameters. Order for BMET placed. Rapid put in note to diuresis pt and to possibly transfer pt if needed. Night provider to contact hospitalist for insight. Pts vitals are 97.5 temp, 187/86, 77 HR, 26 RR, 100% O2. Hospitalist arrived and assessed pt. Orders placed for lab work, Lasix, Potassium infusion, labetalol, nitro patch, and nebulizer. Pt to be transferred to progressive, awaiting bed placement.

## 2019-08-14 NOTE — IPOC Note (Signed)
IPOC deferred no evals due to acute medical issues requiring transfer < 24h post admission to rehab

## 2019-08-14 NOTE — H&P (Addendum)
History and Physical    Caitlin Vasquez:[id number] DOB: 05-Mar-1945 DOA: 08/13/2019  PCP: [name], MD Consultants:  Imogene Burn - vascular Patient coming from:  Home; NOK:  Amyri, [place], [phone]  Chief Complaint:  Respiratory distress in CIR  HPI: Caitlin Vasquez is a 74 y.o. female with medical history significant of COPD on home O2; afib; PVD s/p L fem-pop bypass and B CIA stenting; and HLD.  She was hospitalized from 7/3-6 with an embolic R PCA  with hemorrhagic cerebellar transformation with SAH due to afib, not on AC.  She initially presented to [facility] with AMS, left-sided gaze, and L hemiparesis.  She was found to have severe atherosclerosis of the head and neck.  She was not started on ASA or AC due to hemorrhage, but was recommended to start ASA in 1 week if stable.  While hospitalized, she had hypertensive emergency up to 214/193, treated with Cleviprex and started on Coreg and Norvasc (she was not taking prescribed Coreg at home prior to admission).  Her LDL was 168 but statin therapy was not started due to contraindication with ICH.  She had urinary retention and hesitancy attributed to E coli UTI.  She was admitted to rehab late last evening with ongoing mild AMS, left inattention, LUE weakness, and tendency to "slump to the left."  She developed acute SOB this AM with sats 75% on 3L [place] O2.  BP was as high as 210/85.  CXR concerning for pulmonary edema.  O2 was increased to 10L and Lasix was given.  NTG paste was applied.  At the time of my evaluation, the patient was calm and comfortable.  O2 was decreased back to 2-3L without further desaturation.  She was somnolent but able to answer some questions, appeared weak/fatigued.     Review of Systems: Unable to effectively perform   PMH, PSH, SH, FH reviewed in Epic  Past Medical History:  Diagnosis Date  . COPD (chronic obstructive pulmonary disease) (HCC)   . High cholesterol   . On home oxygen therapy    "2L; all the time"  (04/06/2016)  . Pneumonia    "when I was a kid"   . Urinary hesitancy     Past Surgical History:  Procedure Laterality Date  . ABDOMINAL AORTOGRAM N/A 04/08/2016   Procedure: Abdominal Aortogram;  Surgeon: Sherren Kerns, MD;  Location: [facility] INVASIVE CV LAB;  Service: Cardiovascular;  Laterality: N/A;  . CARDIAC CATHETERIZATION N/A 11/28/2014   Procedure: Right/Left Heart Cath and Coronary Angiography;  Surgeon: [name], MD;  Location: [facility] INVASIVE CV LAB;  Service: Cardiovascular;  Laterality: N/A;  . CARDIAC CATHETERIZATION  08/2006   Hattie Perch 08/25/2006  . DRESSING CHANGE UNDER ANESTHESIA Left 04/12/2016   Procedure: DRESSING CHANGE LEFT HIP AND LEFT HEEL;  Surgeon: Fransisco Hertz, MD;  Location: [facility] OR;  Service: Vascular;  Laterality: Left;  . FEMORAL-POPLITEAL BYPASS GRAFT Left 04/12/2016   Procedure: LEFT FEMORAL-POPITEAL  BYPASS GRAFT;  Surgeon: Fransisco Hertz, MD;  Location: [facility] OR;  Service: Vascular;  Laterality: Left;  . JOINT REPLACEMENT    . LOWER EXTREMITY ANGIOGRAPHY Bilateral 04/08/2016   Procedure: Lower Extremity Angiography;  Surgeon: Sherren Kerns, MD;  Location: [facility] INVASIVE CV LAB;  Service: Cardiovascular;  Laterality: Bilateral;  . PERIPHERAL VASCULAR INTERVENTION Bilateral 04/08/2016   Procedure: Peripheral Vascular Intervention;  Surgeon: Sherren Kerns, MD;  Location: [facility] INVASIVE CV LAB;  Service: Cardiovascular;  Laterality: Bilateral;  common iliac  . TOTAL HIP  ARTHROPLASTY Left 01/29/2016  . VEIN HARVEST Left 04/12/2016   Procedure: LEFT GREATER SAPHENOUS VEIN HARVEST;  Surgeon: Fransisco Hertz, MD;  Location: [facility] OR;  Service: Vascular;  Laterality: Left;    Social History   Socioeconomic History  . Marital status: Widowed    Spouse name: Not on file  . Number of children: Not on file  . Years of education: Not on file  . Highest education level: Not on file  Occupational History  . Not on file  Tobacco Use  . Smoking status: Former Smoker    Packs/day: 1.00     Years: 55.00    Pack years: 55.00    Types: Cigarettes    Quit date: 2016    Years since quitting: 5.5  . Smokeless tobacco: Never Used  Substance and Sexual Activity  . Alcohol use: No  . Drug use: No  . Sexual activity: Never  Other Topics Concern  . Not on file  Social History Narrative  . Not on file   Social Determinants of Health   Financial Resource Strain:   . Difficulty of Paying Living Expenses:   Food Insecurity:   . Worried About Programme researcher, broadcasting/film/video in the Last Year:   . Barista in the Last Year:   Transportation Needs:   . Freight forwarder (Medical):   Marland Kitchen Lack of Transportation (Non-Medical):   Physical Activity:   . Days of Exercise per Week:   . Minutes of Exercise per Session:   Stress:   . Feeling of Stress :   Social Connections:   . Frequency of Communication with Friends and Family:   . Frequency of Social Gatherings with Friends and Family:   . Attends Religious Services:   . Active Member of Clubs or Organizations:   . Attends Banker Meetings:   Marland Kitchen Marital Status:   Intimate Partner Violence:   . Fear of Current or Ex-Partner:   . Emotionally Abused:   Marland Kitchen Physically Abused:   . Sexually Abused:     Allergies  Allergen Reactions  . Clonidine Derivatives Other (See Comments)    hypotension    Family History  Problem Relation Age of Onset  . Heart attack Father     Prior to Admission medications   Medication Sig Start Date End Date Taking? Authorizing Provider  acetaminophen (TYLENOL) 500 MG tablet Take 500 mg by mouth 2 (two) times daily as needed for mild pain.    [provider]  albuterol (PROVENTIL HFA;VENTOLIN HFA) 108 (90 BASE) MCG/ACT inhaler Inhale 2 puffs into the lungs every 6 (six) hours as needed for wheezing or shortness of breath.    [provider]  amLODipine (NORVASC) 5 MG tablet Take 1 tablet (5 mg total) by mouth daily. 08/14/19   Layne Benton, NP  aspirin 81 MG EC tablet Take  1 tablet (81 mg total) by mouth daily. 08/18/19   Layne Benton, NP  atorvastatin (LIPITOR) 80 MG tablet Take 1 tablet (80 mg total) by mouth daily. 08/13/19   Layne Benton, NP  carvedilol (COREG) 12.5 MG tablet Take 1 tablet (12.5 mg total) by mouth 2 (two) times daily with a meal. Patient not taking: Reported on 08/10/2019 04/15/16   [name], MD  cefTRIAXone 1 g in sodium chloride 0.9 % 100 mL Inject 1 g into the vein daily. 08/14/19   Layne Benton, NP  enoxaparin (LOVENOX) 40 MG/0.4ML injection Inject 0.4 mLs (40  mg total) into the skin daily. 08/14/19   Layne Benton, NP  Maltodextrin-Xanthan Gum (RESOURCE THICKENUP CLEAR) POWD Take 120 g by mouth as needed (nectar thick liquids). 08/13/19   Layne Benton, NP  senna-docusate (SENOKOT-S) 8.6-50 MG tablet Take 1 tablet by mouth 2 (two) times daily. 08/13/19   Layne Benton, NP  sodium chloride 0.9 % infusion Inject 50 mLs into the vein continuous. 08/13/19   Layne Benton, NP    Physical Exam: Vitals:   08/14/19 0610 08/14/19 0651 08/14/19 0736 08/14/19 0737  BP: (!) 156/77 (!) 159/77    Pulse: 66 64 66   Resp: (!) 21 14 18    Temp: 97.6 F (36.4 C) 98.1 F (36.7 C)    TempSrc: Axillary Axillary    SpO2: 99% 95% 96% 96%  Weight:      Height:         . General:  Appears calm and comfortable, somnolent/fatigued . Eyes:  normal lids, eyes mostly closed . ENT:  grossly normal hearing, lips & tongue . Neck:  no LAD, masses or thyromegaly . Cardiovascular:  RRR, no m/r/g. No LE edema.  Respiratory:   CTA bilaterally with no wheezes/rales/rhonchi.  Normal respiratory effort. . Abdomen:  soft, NT, ND, NABS . Skin:  no rash or induration seen on limited exam . Musculoskeletal:  Diminished strength L > R UE, minimal participation with exam . Psychiatric: flat/somnolent mood and affect, speech sparse but appropriate . Neurologic:  Unable to evaluate    Radiological Exams on Admission: DG Chest Port 1 View  Result Date:  08/14/2019 CLINICAL DATA:  Acute respiratory distress EXAM: PORTABLE CHEST 1 VIEW COMPARISON:  08/10/2019 FINDINGS: Diffuse interstitial opacity which is new from before. There is asymmetry to the left at the apex which could be related to underlying emphysema. There may be pleural fluid given the hazy appearance at the left lung base. Normal heart size and stable mediastinal contours. No evidence of air leak IMPRESSION: Bilateral interstitial opacity, likely edema superimposed on the patient's emphysema. Electronically Signed   By: 10/11/2019 M.D.   On: 08/14/2019 04:17   DG Swallowing Func-Speech Pathology  Result Date: 08/12/2019 Objective Swallowing Evaluation: Type of Study: MBS-Modified Barium Swallow Study  Patient Details Name: KHADEEJA ELDEN MRN: Mina Marble Date of Birth: 1945-09-06 Today's Date: 08/12/2019 Time: SLP Start Time (ACUTE ONLY): 1345 -SLP Stop Time (ACUTE ONLY): 1407 SLP Time Calculation (min) (ACUTE ONLY): 22 min Past Medical History: Past Medical History: Diagnosis Date . COPD (chronic obstructive pulmonary disease) (HCC)  . High cholesterol  . On home oxygen therapy   "2L; all the time" (04/06/2016) . Pneumonia   "when I was a kid"  . Urinary hesitancy  Past Surgical History: Past Surgical History: Procedure Laterality Date . ABDOMINAL AORTOGRAM N/A 04/08/2016  Procedure: Abdominal Aortogram;  Surgeon: 06/08/2016, MD;  Location: [facility] INVASIVE CV LAB;  Service: Cardiovascular;  Laterality: N/A; . CARDIAC CATHETERIZATION N/A 11/28/2014  Procedure: Right/Left Heart Cath and Coronary Angiography;  Surgeon: 11/30/2014, MD;  Location: [facility] INVASIVE CV LAB;  Service: Cardiovascular;  Laterality: N/A; . CARDIAC CATHETERIZATION  08/2006  09/2006 08/25/2006 . DRESSING CHANGE UNDER ANESTHESIA Left 04/12/2016  Procedure: DRESSING CHANGE LEFT HIP AND LEFT HEEL;  Surgeon: 06/12/2016, MD;  Location: [facility] OR;  Service: Vascular;  Laterality: Left; . FEMORAL-POPLITEAL BYPASS GRAFT Left 04/12/2016  Procedure:  LEFT FEMORAL-POPITEAL  BYPASS GRAFT;  Surgeon: 06/12/2016, MD;  Location: [facility] OR;  Service: Vascular;  Laterality: Left; . JOINT REPLACEMENT   . LOWER EXTREMITY ANGIOGRAPHY Bilateral 04/08/2016  Procedure: Lower Extremity Angiography;  Surgeon: Sherren Kerns, MD;  Location: [facility] INVASIVE CV LAB;  Service: Cardiovascular;  Laterality: Bilateral; . PERIPHERAL VASCULAR INTERVENTION Bilateral 04/08/2016  Procedure: Peripheral Vascular Intervention;  Surgeon: Sherren Kerns, MD;  Location: [facility] INVASIVE CV LAB;  Service: Cardiovascular;  Laterality: Bilateral;  common iliac . TOTAL HIP ARTHROPLASTY Left 01/29/2016 . VEIN HARVEST Left 04/12/2016  Procedure: LEFT GREATER SAPHENOUS VEIN HARVEST;  Surgeon: Fransisco Hertz, MD;  Location: [facility] OR;  Service: Vascular;  Laterality: Left; HPI: 74 y.o. female past medical history of advanced COPD, hypercholesterolemia, atrial fibrillation not on anticoagulation, presents to the emergency room as a transfer from [facility] for evaluation of cerebellar bleed. CT head demonstrates acute hemorrhage within the paramedian inferior left cerebellar hemisphere with adjacent small volume acute subarachnoid hemorrhage-likely extension of some subarachnoid bleed into the fourth ventricle.  Subjective: upright, cooperative, needs cues Assessment / Plan / Recommendation CHL IP CLINICAL IMPRESSIONS 08/12/2019 Clinical Impression Pt has a mild oropharyngeal dysphagia due to impaired coordination, timing, and oral control. She does not fully masticate solid foods, although she does clear the pieces of food that aren't chewed. There is reduced posterior transit and decreased bolus cohesion with varying amounts of mild-moderate lingual residue across different boluses. She spontaneously pushes oral residue back to her pharynx but then it stays int he valleculae without triggering a second swallow. Boluses also spill back into the pharynx as she is trying to perform her initial swallow.  Impaired timing as her pyriform sinuses begin to fill with thin liquids results in aspiration with inconsistent sensation. This is not improved with cup vs straw sips or with use of chin tuck. Recommend Dys 3 diet and nectar thick liquids.  SLP Visit Diagnosis Dysphagia, oropharyngeal phase (R13.12) Attention and concentration deficit following -- Frontal lobe and executive function deficit following -- Impact on safety and function Mild aspiration risk   CHL IP TREATMENT RECOMMENDATION 08/12/2019 Treatment Recommendations Therapy as outlined in treatment plan below   Prognosis 08/12/2019 Prognosis for Safe Diet Advancement Good Barriers to Reach Goals Cognitive deficits Barriers/Prognosis Comment -- CHL IP DIET RECOMMENDATION 08/12/2019 SLP Diet Recommendations Dysphagia 3 (Mech soft) solids;Nectar thick liquid Liquid Administration via Cup;Straw Medication Administration Whole meds with puree Compensations Slow rate;Small sips/bites Postural Changes Seated upright at 90 degrees   CHL IP OTHER RECOMMENDATIONS 08/12/2019 Recommended Consults -- Oral Care Recommendations Oral care BID Other Recommendations Order thickener from pharmacy;Prohibited food (jello, ice cream, thin soups);Remove water pitcher   CHL IP FOLLOW UP RECOMMENDATIONS 08/12/2019 Follow up Recommendations Inpatient Rehab   CHL IP FREQUENCY AND DURATION 08/12/2019 Speech Therapy Frequency (ACUTE ONLY) min 2x/week Treatment Duration 2 weeks      CHL IP ORAL PHASE 08/12/2019 Oral Phase Impaired Oral - Pudding Teaspoon -- Oral - Pudding Cup -- Oral - Honey Teaspoon -- Oral - Honey Cup -- Oral - Nectar Teaspoon -- Oral - Nectar Cup Reduced posterior propulsion;Lingual/palatal residue;Delayed oral transit;Decreased bolus cohesion Oral - Nectar Straw Reduced posterior propulsion;Lingual/palatal residue;Delayed oral transit;Decreased bolus cohesion Oral - Thin Teaspoon -- Oral - Thin Cup Reduced posterior propulsion;Lingual/palatal residue;Delayed oral transit;Decreased  bolus cohesion;Premature spillage Oral - Thin Straw Reduced posterior propulsion;Lingual/palatal residue;Delayed oral transit;Decreased bolus cohesion;Premature spillage Oral - Puree Reduced posterior propulsion;Lingual/palatal residue;Delayed oral transit;Decreased bolus cohesion Oral - Mech Soft Reduced posterior propulsion;Lingual/palatal residue;Delayed oral transit;Decreased bolus cohesion;Impaired mastication Oral - Regular -- Oral -  Multi-Consistency -- Oral - Pill -- Oral Phase - Comment --  CHL IP PHARYNGEAL PHASE 08/12/2019 Pharyngeal Phase Impaired Pharyngeal- Pudding Teaspoon -- Pharyngeal -- Pharyngeal- Pudding Cup -- Pharyngeal -- Pharyngeal- Honey Teaspoon -- Pharyngeal -- Pharyngeal- Honey Cup -- Pharyngeal -- Pharyngeal- Nectar Teaspoon -- Pharyngeal -- Pharyngeal- Nectar Cup Delayed swallow initiation-vallecula Pharyngeal -- Pharyngeal- Nectar Straw Delayed swallow initiation-vallecula Pharyngeal -- Pharyngeal- Thin Teaspoon -- Pharyngeal -- Pharyngeal- Thin Cup Delayed swallow initiation-pyriform sinuses;Penetration/Aspiration before swallow Pharyngeal Material enters airway, passes BELOW cords without attempt by patient to eject out (silent aspiration);Material enters airway, passes BELOW cords and not ejected out despite cough attempt by patient Pharyngeal- Thin Straw Delayed swallow initiation-pyriform sinuses;Penetration/Aspiration before swallow Pharyngeal Material enters airway, passes BELOW cords without attempt by patient to eject out (silent aspiration);Material enters airway, passes BELOW cords and not ejected out despite cough attempt by patient Pharyngeal- Puree Delayed swallow initiation-vallecula Pharyngeal -- Pharyngeal- Mechanical Soft Delayed swallow initiation-vallecula Pharyngeal -- Pharyngeal- Regular -- Pharyngeal -- Pharyngeal- Multi-consistency -- Pharyngeal -- Pharyngeal- Pill -- Pharyngeal -- Pharyngeal Comment --  CHL IP CERVICAL ESOPHAGEAL PHASE 08/12/2019 Cervical  Esophageal Phase WFL Pudding Teaspoon -- Pudding Cup -- Honey Teaspoon -- Honey Cup -- Nectar Teaspoon -- Nectar Cup -- Nectar Straw -- Thin Teaspoon -- Thin Cup -- Thin Straw -- Puree -- Mechanical Soft -- Regular -- Multi-consistency -- Pill -- Cervical Esophageal Comment -- Mahala MenghiniLaura N., M.A. CCC-SLP Acute Rehabilitation Services Pager [phone] Office [phone] 08/12/2019, 3:02 PM               EKG: Independently reviewed.   [id number] - NSR with rate 63; nonspecific ST changes with no evidence of acute ischemia 0633 - NSR with rate 66; nonspecific ST changes with no evidence of acute ischemia   Labs on Admission: I have personally reviewed the available labs and imaging studies at the time of the admission.  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 -Will admit for further evaluation/management -Based on ?recrudescence/worsening of symptoms, stat repeat head CT has been ordered -Will place in SDU for now -Telemetry monitoring -Neurology consult -PT/OT/ST/Nutrition Consults -SW consult for placement - she may qualify for CIR once again but currently does not appear to be a  particularly good candidate  Hypertensive Emergency -Patient with acute onset of SOB and hypoxia early this AM -Evaluation with elevated troponin, pulmonary edema -Prior echo on 7/4 with preserved EF and grade 1 diastolic dysfunction -NTG patch and Lasix given with resolution of symptoms -Will transfer to the acute side for ongoing evaluation/management -There is concern for stroke extension due to HTN crisis overnight -Will trend troponin, although currently low suspicion for ACS -Prior goal BP was  normotension,continue BP meds including Coreg, Norvasc -Continue NTG paste  COPD, on home O2 -Continue [place] O2 -Acute respiratory failure event appears likely related to HTN crisis rather than COPD  Afib -Rate controlled, currently on Coreg (holding, as above) -Not on [facility] prior to admission and cannot use now with [facility]  UTI -Patient with urinary symptoms and abnormal UA and culture on presentation -Culture with 2 organisms, will treat with Rocephin for now for total of 7 days -Current leukocytosis may be associated with hemoconcentration (Hgb also significantly higher), acute phase response, or infection -Low suspicion for acute illness as cause for current symptoms but will follow   HLD -Recent LDL shows very poor control -Continue to hold statin in the setting of SAH   Hyperglycemia -Recent A1c was 5.6, not on home medications  -Will order sensitive-scale SSI      Note: This patient has been tested and is negative for the novel coronavirus COVID-19.     DVT prophylaxis:  SCDs  Code Status: Full - based on prior hospitalization Family Communication: None present; I called and left a message for her son at the time of transfer Disposition Plan:  The patient is from: home  Anticipated d/c is to: CIR vs. SNF  Anticipated d/c date will depend on clinical response to treatment, too early to be determined  Patient is currently: acutely ill Consults called: Neurology;  PT/OT/ST/SW/Nutrition  Admission status: Admit - It is my clinical opinion that admission to INPATIENT is reasonable and necessary because of the expectation that this patient will require hospital care that crosses at least 2 midnights to treat this condition based on the medical complexity of the problems presented.  Given the aforementioned information, the predictability of an adverse outcome is felt to be significant.    Jonah Blue MD [facility]   How to contact the [facility] Attending or Consulting provider 7A - 7P or covering provider during after hours 7P -7A, for this patient?  1. Check the care team in [facility] and look for a) attending/consulting TRH provider listed and b) the [facility] team listed 2. Log into [email or web address] and use [place]'s universal password to access. If you do not have the password, please contact the hospital operator. 3. Locate the [facility] provider you are looking for under [facility] and page to a number that you can be directly reached. 4. If you still have difficulty reaching the provider, please page the [facility] (Director on Call) for the Hospitalists listed on amion for assistance.   08/14/2019, 9:44 AM

## 2019-08-14 NOTE — Progress Notes (Signed)
RT called to patient room due to patient having an episode of desat and to be placed on bipap.  Upon arrival patient was noted to have a sat of 96% on 10L nasal cannula.  HR of 69.  RR of 18.  Patient auscultated and noted to be clear/diminished at bases.  Patient will wake up when name called however falls back asleep but also noted to be able to cough.  Patient to be transferred to progressive floor.  Will hold on bipap until moved to new room or if respiratory status declines.  Will continue to monitor.

## 2019-08-14 NOTE — Progress Notes (Signed)
Inpatient Rehabilitation Center Individual Statement of Services  Patient Name:  NIKOLETA DADY  Date:  08/14/2019  Welcome to the Inpatient Rehabilitation Center.  Our goal is to provide you with an individualized program based on your diagnosis and situation, designed to meet your specific needs.  With this comprehensive rehabilitation program, you will be expected to participate in at least 3 hours of rehabilitation therapies Monday-Friday, with modified therapy programming on the weekends.  Your rehabilitation program will include the following services:  Physical Therapy (PT), Occupational Therapy (OT), Speech Therapy (ST), 24 hour per day rehabilitation nursing, Therapeutic Recreaction (TR), Neuropsychology, Care Coordinator, Rehabilitation Medicine, Nutrition Services, Pharmacy Services and Other  Weekly team conferences will be held on Wednesdays to discuss your progress.  Your Inpatient Rehabilitation Care Coordinator will talk with you frequently to get your input and to update you on team discussions.  Team conferences with you and your family in attendance may also be held.  Expected length of stay: 10-12 Days  Overall anticipated outcome: Min A  Depending on your progress and recovery, your program may change. Your Inpatient Rehabilitation Care Coordinator will coordinate services and will keep you informed of any changes. Your Inpatient Rehabilitation Care Coordinator's name and contact numbers are listed  below.  The following services may also be recommended but are not provided by the Inpatient Rehabilitation Center:    Home Health Rehabiltiation Services  Outpatient Rehabilitation Services    Arrangements will be made to provide these services after discharge if needed.  Arrangements include referral to agencies that provide these services.  Your insurance has been verified to be:  Medicare Your primary doctor is:  [name], MD Pertinent information will be shared  with your doctor and your insurance company.  Inpatient Rehabilitation Care Coordinator:  Lavera Guise, [place] [phone] or [phone]  Information discussed with and copy given to patient by: Andria Rhein, 08/14/2019, 9:05 AM

## 2019-08-14 NOTE — Progress Notes (Signed)
STROKE TEAM PROGRESS NOTE   INTERVAL HISTORY Stroke team was asked to come back and evaluate the patient because of neurological worsening this morning.  She was admitted to rehab yesterday where yesterday evening she developed acute onset shortness of breath with oxygen sats dropping to 75% on 3 L nasal cannula.  Her BP was elevated at 210/85.  Chest x-ray showed pulmonary edema.  Oxygen was increased to 10 L and she was given Lasix and Nitropaste.  Patient condition improved subsequently but she remained somnolent.  A stat CT scan of the head was obtained which showed expected evolutionary changes in the large right PCA infarct and stable appearance of the left cerebellar hematoma without significant hydrocephalus or ventriculomegaly. OBJECTIVE Vitals:   08/14/19 1109 08/14/19 1205  BP: (!) 157/76 (!) 142/77  Pulse: 61 61  Resp: 16 16  Temp: 98.6 F (37 C) 98.9 F (37.2 C)  TempSrc: Axillary Oral  SpO2: 100%   Weight:  62.6 kg  Height:  5\' 4"  (1.626 m)   CBC:  Recent Labs  Lab 08/13/19 0602 08/14/19 0524  WBC 7.8 21.8*  NEUTROABS  --  19.3*  HGB 10.6* 12.8  HCT 33.4* 40.5  MCV 89.3 90.8  PLT 210 261   Basic Metabolic Panel:  Recent Labs  Lab 08/13/19 0602 08/14/19 0524  NA 139 141  K 3.4* 5.1  CL 105 104  CO2 24 24  GLUCOSE 103* 137*  BUN 12 11  CREATININE 0.98 1.09*  CALCIUM 8.0* 8.6*  MG  --  2.0   Lipid Panel:     Component Value Date/Time   CHOL 260 (H) 08/10/2019 2043   TRIG 214 (H) 08/10/2019 2043   HDL 49 08/10/2019 2043   CHOLHDL 5.3 08/10/2019 2043   VLDL 43 (H) 08/10/2019 2043   LDLCALC 168 (H) 08/10/2019 2043   HgbA1c:  Lab Results  Component Value Date   HGBA1C 5.6 08/10/2019   Urine Drug Screen:     Component Value Date/Time   LABOPIA POSITIVE (A) 08/10/2019 2137   COCAINSCRNUR NONE DETECTED 08/10/2019 2137   LABBENZ NONE DETECTED 08/10/2019 2137   AMPHETMU NONE DETECTED 08/10/2019 2137   THCU NONE DETECTED 08/10/2019 2137   LABBARB  NONE DETECTED 08/10/2019 2137    Alcohol Level No results found for: ETH  IMAGING past 24h CT HEAD WO CONTRAST  Result Date: 08/14/2019 CLINICAL DATA:  Intraparenchymal hemorrhage, acute, progressive altered mental status EXAM: CT HEAD WITHOUT CONTRAST TECHNIQUE: Contiguous axial images were obtained from the base of the skull through the vertex without intravenous contrast. COMPARISON:  MRI 08/10/2019, CT 08/10/2019 FINDINGS: Brain: The intraparenchymal hematoma seen within the inferior left cerebellar hemisphere is unchanged measuring 9 mm x 12 mm. A small amount of surrounding vasogenic edema is again identified, stable since prior examination. Since the prior examination, there has been evolution of the infarct involving the right PCA territory involving the parasagittal right temporal lobe and right occipital lobe. Mild associated mass effect is present with effacement of the overlying sulci. No superimposed acute hemorrhage in this region. Additionally, there is evolution of the a patchy infarcts within the right basal ganglia, primarily involving the right thalamus and medial lentiform nucleus. There has, however, developed a tiny focus of hemorrhage within the right globus pallidus. No significant midline shift. No extra-axial fluid collection. Ventricular size is normal. Vascular: Extensive atherosclerotic calcification noted within the terminal right vertebral artery and carotid siphons. Skull: Intact Sinuses/Orbits: Paranasal sinuses are clear. Orbits are unremarkable. Other:  Mastoid air cells and middle ear cavities are clear. IMPRESSION: Interval evolution of large right PCA territory infarct involving the parasagittal temporal lobe, occipital lobe, and right basal ganglia. Tiny focus of probable petechial hemorrhage now evident within the right globus pallidus. Mild mass effect without midline shift. Stable intraparenchymal hematoma and associated edema involving the left cerebellar lobe.  Electronically Signed   By: Helyn Numbers MD   On: 08/14/2019 11:02   DG Chest Port 1 View  Result Date: 08/14/2019 CLINICAL DATA:  Acute respiratory distress EXAM: PORTABLE CHEST 1 VIEW COMPARISON:  08/10/2019 FINDINGS: Diffuse interstitial opacity which is new from before. There is asymmetry to the left at the apex which could be related to underlying emphysema. There may be pleural fluid given the hazy appearance at the left lung base. Normal heart size and stable mediastinal contours. No evidence of air leak IMPRESSION: Bilateral interstitial opacity, likely edema superimposed on the patient's emphysema. Electronically Signed   By: Marnee Spring M.D.   On: 08/14/2019 04:17     PHYSICAL EXAM Pleasant elderly lady not in distress. . Afebrile. Head is nontraumatic. Neck is supple without bruit.    Cardiac exam no murmur or gallop. Lungs are clear to auscultation. Distal pulses are well felt. Neurological Exam ; Patient is drowsy but can be aroused.  Speech is hypophonic but without aphasia apraxia or dysarthria.  She has right gaze preference but she is able to look to the left past midline..  Face is symmetric without weakness.  She has dense left homonymous hemianopsia.  Tongue midline.  Motor system exam shows normal strength on the right.  Left hemiparesis with.  Gait not tested ASSESSMENT/PLAN Caitlin Vasquez is a 74 y.o. 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  She did not receive IV t-PA due to ICH.  Stroke: R posterior cerebral artery ischemic infarct, L cerebellar ICH w/ SAH (likely Hemorrhagic transformation of ischemic infarct as well), Infarcts embolic d/t known AF not on [facility] She has had some subjective increase in left-sided  deficits in the setting of recent hypoxic event with hypertensive emergency.  CT head - Redemonstrated IPH paramedian inferior left cerebellum with SAH slightly redistributed stable. Atrophy. chronic microvascular angiopathy. Remote bilateral basal ganglia lacunes.  MRI Brain W / WO - stable inferior L cerebellar ICH. 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 Infundibulum L SCA origin. Severe atherosclerosis head and neck (severe L VA, high-grade L ICA distal bulb w/ string sign, R ICA 65% stenosis soft plaque vs web, mild to moderate R>L ICA siphon, severe proximal R PCA, moderate R V4, moderate R A1, severe R M2 superior division). Aortic atherosclerosis. Emphysema  2D Echo -ejection fraction of 55 to 60%.  LDL - 168  HgbA1c - 5.6  UDS - opiates  VTE prophylaxis - SCDs  aspirin 81 mg daily (not taking plavix) prior to admission, now on No antithrombotic given hemorrhage  Therapy recommendations:  CIR  Disposition:  Pending  Transfer to floor once off BP gtt  Atrial Fibrillation  Home anticoagulation:  none   Not an AC candidate d/t hemorrhage    Hypertensive Emergency  BP as high as 214/193  Home BP meds: Coreg (not taking)  Current BP meds: Cleviprex gtt and  Normodyne prn . Increase SBP goal < 180 mg Hg  . Add coreg 12.5 bid and norvasc 5 . Wean cleviprex . Long-term BP goal normotensive  Hyperlipidemia  Home Lipid lowering medication: Lipitor 20 mg daily  LDL 168, goal < 70  Current lipid lowering medication: None (statin contraindicated with ICH)  Consider statin at discharge  Other Stroke Risk Factors  Advanced age  Former cigarette smoker - quit  UDS - opiates, advised to stop using  Remote infarcts by CT  Urinary Retention  I&O cathed x 3 for ~300cc each time  Add IVF, bolus x 1  Hold foley. I&O cath again  Ambulate   Urine Cx given hesitancy and urgency pending   Rocephin 7/5>>7/8 (3)  Other  Active Problems  Aortic Atherosclerosis (ICD10-I70.0)  COPD - O2 dependent at home  Hypokalemia - 3.3 -> supplement - > 4.0 - resolved  Temp 99.6 - WBCs - 18.2->11.3 - blood cultures - no growth 1 d (U/A possible UTI, UCx pending)  CKD - stage 3a - creatinine - 1.08->0.96 Hyperglycemia - 175 ->110 (HgBA1C - 5.6)  Hospital day # 0 Recommend maintain adequate oxygenation and optimize cardiopulmonary status and keep systolic blood pressure below 355.  Expect improvement in left-sided deficits after above parameters on optimized.  No further neurological testing is indicated at the present time family not available at the bedside for discussion.. Discussed with Dr. Jonah Blue.  Greater than 50% time during the 35-minute visit was spent on counseling and coordination of care about her recent stroke and neurological worsening and pulmonary edema and hypertensive crisis and discussion with care team.  Delia Heady, MD  To contact Stroke Continuity provider, please refer to [email or web address]. After hours, contact General Neurology

## 2019-08-14 NOTE — Progress Notes (Signed)
Inpatient Rehabilitation  Patient information reviewed and entered into eRehab system by Davine Coba M. Caitlynn Ju, M.A., CCC/SLP, PPS Coordinator.  Information including medical coding, functional ability and quality indicators will be reviewed and updated through discharge.    

## 2019-08-14 NOTE — Plan of Care (Signed)
  Problem: RH BLADDER ELIMINATION Goal: RH STG MANAGE BLADDER WITH ASSISTANCE Description: STG Manage Bladder With mod I  Outcome: Progressing Goal: RH STG MANAGE BLADDER WITH MEDICATION WITH ASSISTANCE Description: STG Manage Bladder With Medication With Assistance. Outcome: Progressing Goal: RH STG MANAGE BLADDER WITH EQUIPMENT WITH ASSISTANCE Description: STG Manage Bladder With Equipment With Mod I Outcome: Progressing

## 2019-08-14 NOTE — Progress Notes (Signed)
Critical Value Troponin: 389 Dr Rueben Bash notified Stat EKG ordered

## 2019-08-14 NOTE — Significant Event (Signed)
Son Jill Alexanders) notified of pt respiratory distress and the need for more medical care that the pt would be transferred to another unit and someone would notify him when transfer occurs with more information.

## 2019-08-14 NOTE — Progress Notes (Signed)
   08/14/19 0358  Assess: MEWS Score  Temp 97.9 F (36.6 C)  BP (!) 151/61  Pulse Rate 78  Resp (!) 32  Level of Consciousness Alert  SpO2 94 %  O2 Device HFNC  Patient Activity (if Appropriate) In bed  O2 Flow Rate (L/min) 10 L/min  Assess: MEWS Score  MEWS Temp 0  MEWS Systolic 0  MEWS Pulse 0  MEWS RR 2  MEWS LOC 0  MEWS Score 2  MEWS Score Color Yellow  Assess: if the MEWS score is Yellow or Red  Were vital signs taken at a resting state? Yes  Focused Assessment Documented focused assessment  Early Detection of Sepsis Score *See Row Information* Low  MEWS guidelines implemented *See Row Information* Yes  Treat  MEWS Interventions Escalated (See documentation below);Consulted Respiratory Therapy  Take Vital Signs  Increase Vital Sign Frequency  Yellow: Q 2hr X 2 then Q 4hr X 2, if remains yellow, continue Q 4hrs  Escalate  MEWS: Escalate Yellow: discuss with charge nurse/RN and consider discussing with provider and RRT  Notify: Charge Nurse/RN  Name of Charge Nurse/RN Notified Remi Deter I  Date Charge Nurse/RN Notified 08/14/19  Time Charge Nurse/RN Notified 0340  Notify: Provider  Provider Name/Title Jacalyn Lefevre  Date Provider Notified 08/14/19  Time Provider Notified 0345  Notification Type Call  Notification Reason Change in status  Response See new orders  Date of Provider Response 08/14/19  Time of Provider Response 0439  Notify: Rapid Response  Name of Rapid Response RN Notified David  Date Rapid Response Notified 08/14/19  Time Rapid Response Notified 8250  Document  Patient Outcome Not stable and remains on department;Transferred/level of care increased  Progress note created (see row info) Yes

## 2019-08-14 NOTE — Progress Notes (Signed)
Dr Toniann Fail notified via [facility] about pt's EKG results. Pts current potassium level is 5.1. Verbal order to stop potassium runs. Still awaiting bed placement for progressive. If pt were to worsen in condition, possible intervention of Bipap placement, per provider.

## 2019-08-14 NOTE — Progress Notes (Signed)
Unable to measure sacral wounds due to pt condition.

## 2019-08-14 NOTE — Progress Notes (Signed)
Received a call from Sam RN at 03:45, reporting Ms. Laster was disoriented, SOB and clammy. She was currently on 3.5 liters of oxygen with an oxygen saturation of 75%. Respiratory and Rapid Nurse was called stat. Respiratory increase her O2 to 6 Liters, she is currently on 10 liters of oxygen via high flow nasal cannula with oxygen saturation 100%. CXR and ABG was obtained.[facility] was called, spoke with Dr Antionette Char regarding the above, Lasix was ordered and she will be discharge to acute care setting. Spoke with Sam RN regarding the above and he verbalizes understanding.

## 2019-08-14 NOTE — Progress Notes (Signed)
SLP Cancellation Note  Patient Details Name: Caitlin Vasquez MRN: [id number] DOB: 03-23-1945   Cancelled treatment:       Patient missed 60 minutes of skilled SLP intervention due to inability to participate secondary to medical issues with plans to transfer off unit.                                                                                                Gwendloyn Forsee 08/14/2019, 9:28 AM

## 2019-08-14 NOTE — Progress Notes (Signed)
  Pt had a yellow mews that changed to red on 08/14/2019 At 0525. Vs were obtained at a resting state. Jacalyn Lefevre aware of residents condition. Press photographer (Sam) aware. Rapid response called and respiratory. New orders were given verbally to Sam due to unstable vitals and pt condition. Pt was later discharged to another unit.          MEWS Guidelines - (patients age 76 and over)

## 2019-08-14 NOTE — Significant Event (Signed)
Entered pt room with NT to provide peri-care and obtain VS. Upon entering found pt lethargic, clammy, pouring sweat, gasping for air. VS were unstable with pulse oximeter reading at 75 on 3L. Oxygen rate changed to 4.5L and pulse oximeter reading remained at 86. Respiratory came and put pt on 10L. Continuous pulse oximeter applied to R Hand. Pulse oximeter reading now 96. Unable to get accurate temperature orally and axillary. Obtained tempeture rectally 97.4. Pt had second incontinent BM of yellow stool. Rapid response called and still at bedside. Awaiting blood gas results obtained by respiratory and CXR.

## 2019-08-14 NOTE — Progress Notes (Signed)
[place] [facility] PHYSICAL MEDICINE & REHABILITATION PROGRESS NOTE   Subjective/Complaints:  Events of this am reviewed, discussed with Dr Ophelia Charter, pt now on 2 L O2 [place] but remains poorly responsive.    ROS- cannot assess due to Lethargy   Objective:   DG Chest Port 1 View  Result Date: 08/14/2019 CLINICAL DATA:  Acute respiratory distress EXAM: PORTABLE CHEST 1 VIEW COMPARISON:  08/10/2019 FINDINGS: Diffuse interstitial opacity which is new from before. There is asymmetry to the left at the apex which could be related to underlying emphysema. There may be pleural fluid given the hazy appearance at the left lung base. Normal heart size and stable mediastinal contours. No evidence of air leak IMPRESSION: Bilateral interstitial opacity, likely edema superimposed on the patient's emphysema. Electronically Signed   By: Marnee Spring M.D.   On: 08/14/2019 04:17   DG Swallowing Func-Speech Pathology  Result Date: 08/12/2019 Objective Swallowing Evaluation: Type of Study: MBS-Modified Barium Swallow Study  Patient Details Name: Caitlin Vasquez MRN: [id number] Date of Birth: 26-Dec-1945 Today's Date: 08/12/2019 Time: SLP Start Time (ACUTE ONLY): 1345 -SLP Stop Time (ACUTE ONLY): 1407 SLP Time Calculation (min) (ACUTE ONLY): 22 min Past Medical History: Past Medical History: Diagnosis Date . COPD (chronic obstructive pulmonary disease) (HCC)  . High cholesterol  . On home oxygen therapy   "2L; all the time" (04/06/2016) . Pneumonia   "when I was a kid"  . Urinary hesitancy  Past Surgical History: Past Surgical History: Procedure Laterality Date . ABDOMINAL AORTOGRAM N/A 04/08/2016  Procedure: Abdominal Aortogram;  Surgeon: Sherren Kerns, MD;  Location: [facility] INVASIVE CV LAB;  Service: Cardiovascular;  Laterality: N/A; . CARDIAC CATHETERIZATION N/A 11/28/2014  Procedure: Right/Left Heart Cath and Coronary Angiography;  Surgeon: [name], MD;  Location: [facility] INVASIVE CV LAB;  Service: Cardiovascular;  Laterality: N/A; .  CARDIAC CATHETERIZATION  08/2006  Hattie Perch 08/25/2006 . DRESSING CHANGE UNDER ANESTHESIA Left 04/12/2016  Procedure: DRESSING CHANGE LEFT HIP AND LEFT HEEL;  Surgeon: Fransisco Hertz, MD;  Location: [facility] OR;  Service: Vascular;  Laterality: Left; . FEMORAL-POPLITEAL BYPASS GRAFT Left 04/12/2016  Procedure: LEFT FEMORAL-POPITEAL  BYPASS GRAFT;  Surgeon: Fransisco Hertz, MD;  Location: [facility] OR;  Service: Vascular;  Laterality: Left; . JOINT REPLACEMENT   . LOWER EXTREMITY ANGIOGRAPHY Bilateral 04/08/2016  Procedure: Lower Extremity Angiography;  Surgeon: Sherren Kerns, MD;  Location: [facility] INVASIVE CV LAB;  Service: Cardiovascular;  Laterality: Bilateral; . PERIPHERAL VASCULAR INTERVENTION Bilateral 04/08/2016  Procedure: Peripheral Vascular Intervention;  Surgeon: Sherren Kerns, MD;  Location: [facility] INVASIVE CV LAB;  Service: Cardiovascular;  Laterality: Bilateral;  common iliac . TOTAL HIP ARTHROPLASTY Left 01/29/2016 . VEIN HARVEST Left 04/12/2016  Procedure: LEFT GREATER SAPHENOUS VEIN HARVEST;  Surgeon: Fransisco Hertz, MD;  Location: [facility] OR;  Service: Vascular;  Laterality: Left; HPI: 74 y.o. female past medical history of advanced COPD, hypercholesterolemia, atrial fibrillation not on anticoagulation, presents to the emergency room as a transfer from [facility] for evaluation of cerebellar bleed. CT head demonstrates acute hemorrhage within the paramedian inferior left cerebellar hemisphere with adjacent small volume acute subarachnoid hemorrhage-likely extension of some subarachnoid bleed into the fourth ventricle.  Subjective: upright, cooperative, needs cues Assessment / Plan / Recommendation CHL IP CLINICAL IMPRESSIONS 08/12/2019 Clinical Impression Pt has a mild oropharyngeal dysphagia due to impaired coordination, timing, and oral control. She does not fully masticate solid foods, although she does clear the pieces of food that aren't chewed. There is reduced posterior transit  and decreased bolus cohesion with varying  amounts of mild-moderate lingual residue across different boluses. She spontaneously pushes oral residue back to her pharynx but then it stays int he valleculae without triggering a second swallow. Boluses also spill back into the pharynx as she is trying to perform her initial swallow. Impaired timing as her pyriform sinuses begin to fill with thin liquids results in aspiration with inconsistent sensation. This is not improved with cup vs straw sips or with use of chin tuck. Recommend Dys 3 diet and nectar thick liquids.  SLP Visit Diagnosis Dysphagia, oropharyngeal phase (R13.12) Attention and concentration deficit following -- Frontal lobe and executive function deficit following -- Impact on safety and function Mild aspiration risk   CHL IP TREATMENT RECOMMENDATION 08/12/2019 Treatment Recommendations Therapy as outlined in treatment plan below   Prognosis 08/12/2019 Prognosis for Safe Diet Advancement Good Barriers to Reach Goals Cognitive deficits Barriers/Prognosis Comment -- CHL IP DIET RECOMMENDATION 08/12/2019 SLP Diet Recommendations Dysphagia 3 (Mech soft) solids;Nectar thick liquid Liquid Administration via Cup;Straw Medication Administration Whole meds with puree Compensations Slow rate;Small sips/bites Postural Changes Seated upright at 90 degrees   CHL IP OTHER RECOMMENDATIONS 08/12/2019 Recommended Consults -- Oral Care Recommendations Oral care BID Other Recommendations Order thickener from pharmacy;Prohibited food (jello, ice cream, thin soups);Remove water pitcher   CHL IP FOLLOW UP RECOMMENDATIONS 08/12/2019 Follow up Recommendations Inpatient Rehab   CHL IP FREQUENCY AND DURATION 08/12/2019 Speech Therapy Frequency (ACUTE ONLY) min 2x/week Treatment Duration 2 weeks      CHL IP ORAL PHASE 08/12/2019 Oral Phase Impaired Oral - Pudding Teaspoon -- Oral - Pudding Cup -- Oral - Honey Teaspoon -- Oral - Honey Cup -- Oral - Nectar Teaspoon -- Oral - Nectar Cup Reduced posterior propulsion;Lingual/palatal  residue;Delayed oral transit;Decreased bolus cohesion Oral - Nectar Straw Reduced posterior propulsion;Lingual/palatal residue;Delayed oral transit;Decreased bolus cohesion Oral - Thin Teaspoon -- Oral - Thin Cup Reduced posterior propulsion;Lingual/palatal residue;Delayed oral transit;Decreased bolus cohesion;Premature spillage Oral - Thin Straw Reduced posterior propulsion;Lingual/palatal residue;Delayed oral transit;Decreased bolus cohesion;Premature spillage Oral - Puree Reduced posterior propulsion;Lingual/palatal residue;Delayed oral transit;Decreased bolus cohesion Oral - Mech Soft Reduced posterior propulsion;Lingual/palatal residue;Delayed oral transit;Decreased bolus cohesion;Impaired mastication Oral - Regular -- Oral - Multi-Consistency -- Oral - Pill -- Oral Phase - Comment --  CHL IP PHARYNGEAL PHASE 08/12/2019 Pharyngeal Phase Impaired Pharyngeal- Pudding Teaspoon -- Pharyngeal -- Pharyngeal- Pudding Cup -- Pharyngeal -- Pharyngeal- Honey Teaspoon -- Pharyngeal -- Pharyngeal- Honey Cup -- Pharyngeal -- Pharyngeal- Nectar Teaspoon -- Pharyngeal -- Pharyngeal- Nectar Cup Delayed swallow initiation-vallecula Pharyngeal -- Pharyngeal- Nectar Straw Delayed swallow initiation-vallecula Pharyngeal -- Pharyngeal- Thin Teaspoon -- Pharyngeal -- Pharyngeal- Thin Cup Delayed swallow initiation-pyriform sinuses;Penetration/Aspiration before swallow Pharyngeal Material enters airway, passes BELOW cords without attempt by patient to eject out (silent aspiration);Material enters airway, passes BELOW cords and not ejected out despite cough attempt by patient Pharyngeal- Thin Straw Delayed swallow initiation-pyriform sinuses;Penetration/Aspiration before swallow Pharyngeal Material enters airway, passes BELOW cords without attempt by patient to eject out (silent aspiration);Material enters airway, passes BELOW cords and not ejected out despite cough attempt by patient Pharyngeal- Puree Delayed swallow  initiation-vallecula Pharyngeal -- Pharyngeal- Mechanical Soft Delayed swallow initiation-vallecula Pharyngeal -- Pharyngeal- Regular -- Pharyngeal -- Pharyngeal- Multi-consistency -- Pharyngeal -- Pharyngeal- Pill -- Pharyngeal -- Pharyngeal Comment --  CHL IP CERVICAL ESOPHAGEAL PHASE 08/12/2019 Cervical Esophageal Phase WFL Pudding Teaspoon -- Pudding Cup -- Honey Teaspoon -- Honey Cup -- Nectar Teaspoon -- Nectar Cup -- Nectar Straw -- Thin Teaspoon -- Thin  Cup -- Thin Straw -- Puree -- Mechanical Soft -- Regular -- Multi-consistency -- Pill -- Cervical Esophageal Comment -- Mahala Menghini., M.A. CCC-SLP Acute Rehabilitation Services Pager [phone] Office [phone] 08/12/2019, 3:02 PM              Recent Labs    08/13/19 0602 08/14/19 0524  WBC 7.8 21.8*  HGB 10.6* 12.8  HCT 33.4* 40.5  PLT 210 261   Recent Labs    08/13/19 0602 08/14/19 0524  NA 139 141  K 3.4* 5.1  CL 105 104  CO2 24 24  GLUCOSE 103* 137*  BUN 12 11  CREATININE 0.98 1.09*  CALCIUM 8.0* 8.6*    Intake/Output Summary (Last 24 hours) at 08/14/2019 0803 Last data filed at 08/14/2019 0709 Gross per 24 hour  Intake 120 ml  Output 1550 ml  Net -1430 ml     Physical Exam: Vital Signs Blood pressure (!) 159/77, pulse 66, temperature 98.1 F (36.7 C), temperature source Axillary, resp. rate 18, height 5\' 4"  (1.626 m), weight 62.6 kg, SpO2 96 %.   General:Eyes closed but opens briefly to physical stim Mood and affect- lethargic Heart: Regular rate and rhythm no rubs murmurs or extra sounds Lungs: decreased BS, no cough or resp distress, no rales or rhonchi  Abdomen: Positive bowel sounds, soft nontender to palpation, nondistended Extremities: No clubbing, cyanosis, or edema Skin: no evidence of rash, pale Neurologic:Orientation to person, "hospital" Sensory exam cannot fully assess winces and withdraws to pinch RUE> LUE , MMT does not fully cooperate, will grasp weakly with RUE> LUE , trace movement in  LEs Musculoskeletal: winces with BLE passive ROM   Assessment/Plan: 1. 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 New infarct  Care Tool:  Bathing  Bathing activity did not occur:  (NA)           Bathing assist       Upper Body Dressing/Undressing Upper body dressing Upper body dressing/undressing activity did not occur (including orthotics): N/A      Upper body assist      Lower Body Dressing/Undressing Lower body dressing    Lower body dressing activity did not occur: N/A       Lower body assist       Toileting Toileting    Toileting assist Assist for toileting: 2 Helpers     Transfers Chair/bed transfer  Transfers assist           Locomotion Ambulation   Ambulation assist              Walk 10 feet activity   Assist           Walk 50 feet activity   Assist           Walk 150 feet activity   Assist           Walk 10 feet on uneven surface  activity   Assist           Wheelchair     Assist               Wheelchair 50 feet with 2 turns activity    Assist            Wheelchair 150 feet activity     Assist          Blood pressure (!) 159/77, pulse 66, temperature 98.1 F (36.7 C), temperature source Axillary, resp. rate 18,  height 5\' 4"  (1.626 m), weight 62.6 kg, SpO2 96 %.  Medical Problem List and Plan: 1.  Impaired mobility and ADLs secondary to cerebellar hemorrhage             -unable to participate in rehab, ? New infarct or hemorrhage causing AMS and increaed  (was A and O x 3 with 4/5 strength  on Left side  During consult 7/5 Discussed with IM Dr Ophelia CharterYates, still needing transfer .  Would reco repeat CT or MRI, +/- neuro re eval   2.  Antithrombotics: -DVT/anticoagulation:  Pharmaceutical: Lovenox             -antiplatelet therapy: N/A due to bleed.  3. Headache Pain Management: Tylenol prn  effective for HA. Will trial hydrocodone X 1 dose and monitor for SE this evening.   4. Mood: LCSW to follow for evaluation and support.              -antipsychotic agents: N/a 5. Neuropsych: This patient is capable of making decisions on her own behalf. 6. Skin/Wound Care: Will order air mattress overlay for DTI. Routine pressure relief measures.  7. Fluids/Electrolytes/Nutrition: Monitor I/O. Check lytes in am. Encourage nectar liquids.  8. HTN: Monitor bp qid as poorly controlled---SBP goal < 160. Was started on coreg and Norvasc on 07/05 --monitor and titrate as indicated. Received 2 doses of IV Labetolol on 7/6.  9. E coli UTI: Back pain appears to be flank pain. Rocephin D#2-->transitioned to keflex 500mg  but WBC 22K today- ? Need to broaden abx coverage pending sensitivities 10. A fib: No AC due to bleed. Monitor HR tid. Continue coreg. Currently well controlled.  11. Dysphagia:  On Dysphagia 3, nectar liquids with swallow precautions.  12. Recurrent hypokalemia: Likely dilutional--will d/c fluids and add oral supplement X 2 days. Will recheck labs in am.  12. ABLA: H/H drop from 14.0--->10.6 today. Monitor for signs of bleeding. Continue PPI. Will order stool guaiacs.  13. COPD: Has been off oxygen for some time. Will d/c oxygen and use prn. Continue albuterol prn.  14.  Right wrist pain: Question due to infiltration v/s cellulitis. Will add warm compresses. Continue antibiotics for now.   15. Dyslipidemia: Consider statin at discharge as contraindicated due to bleed.   16. Urinary retention: Foley placed prior to transfer to rehab--will d/c and monitor voiding with PVRs/ bladder scans.  I/O cath for volumes > 300 cc. Should improve with treatment of UTI.      LOS: 1 days A FACE TO FACE EVALUATION WAS PERFORMED  Erick Colacendrew E Fredda Clarida 08/14/2019, 8:03 AM

## 2019-08-14 NOTE — Plan of Care (Signed)
[facility]

## 2019-08-15 DIAGNOSIS — R4182 Altered mental status, unspecified: Secondary | ICD-10-CM

## 2019-08-15 LAB — CBC WITH DIFFERENTIAL/PLATELET
Abs Immature Granulocytes: 0.07 10*3/uL (ref 0.00–0.07)
Basophils Absolute: 0 10*3/uL (ref 0.0–0.1)
Basophils Relative: 0 %
Eosinophils Absolute: 0.1 10*3/uL (ref 0.0–0.5)
Eosinophils Relative: 1 %
HCT: 38.4 % (ref 36.0–46.0)
Hemoglobin: 12.2 g/dL (ref 12.0–15.0)
Immature Granulocytes: 1 %
Lymphocytes Relative: 12 %
Lymphs Abs: 1.1 10*3/uL (ref 0.7–4.0)
MCH: 28.8 pg (ref 26.0–34.0)
MCHC: 31.8 g/dL (ref 30.0–36.0)
MCV: 90.8 fL (ref 80.0–100.0)
Monocytes Absolute: 0.8 10*3/uL (ref 0.1–1.0)
Monocytes Relative: 8 %
Neutro Abs: 7.2 10*3/uL (ref 1.7–7.7)
Neutrophils Relative %: 78 %
Platelets: 269 10*3/uL (ref 150–400)
RBC: 4.23 MIL/uL (ref 3.87–5.11)
RDW: 13.7 % (ref 11.5–15.5)
WBC: 9.3 10*3/uL (ref 4.0–10.5)
nRBC: 0 % (ref 0.0–0.2)

## 2019-08-15 LAB — CBC
HCT: 38.7 % (ref 36.0–46.0)
Hemoglobin: 12.5 g/dL (ref 12.0–15.0)
MCH: 29.2 pg (ref 26.0–34.0)
MCHC: 32.3 g/dL (ref 30.0–36.0)
MCV: 90.4 fL (ref 80.0–100.0)
Platelets: 275 10*3/uL (ref 150–400)
RBC: 4.28 MIL/uL (ref 3.87–5.11)
RDW: 13.7 % (ref 11.5–15.5)
WBC: 11.1 10*3/uL — ABNORMAL HIGH (ref 4.0–10.5)
nRBC: 0 % (ref 0.0–0.2)

## 2019-08-15 LAB — BASIC METABOLIC PANEL
Anion gap: 10 (ref 5–15)
BUN: 11 mg/dL (ref 8–23)
CO2: 26 mmol/L (ref 22–32)
Calcium: 8.2 mg/dL — ABNORMAL LOW (ref 8.9–10.3)
Chloride: 106 mmol/L (ref 98–111)
Creatinine, Ser: 0.93 mg/dL (ref 0.44–1.00)
GFR calc Af Amer: >60 mL/min (ref >60)
GFR calc non Af Amer: >60 mL/min (ref >60)
Glucose, Bld: 112 mg/dL — ABNORMAL HIGH (ref 70–99)
Potassium: 3.5 mmol/L (ref 3.5–5.1)
Sodium: 142 mmol/L (ref 135–145)

## 2019-08-15 LAB — GLUCOSE, CAPILLARY
Glucose-Capillary: 97 mg/dL (ref 70–99)
Glucose-Capillary: 135 mg/dL — ABNORMAL HIGH (ref 70–99)
Glucose-Capillary: 160 mg/dL — ABNORMAL HIGH (ref 70–99)
Glucose-Capillary: 160 mg/dL — ABNORMAL HIGH (ref 70–99)

## 2019-08-15 MED ORDER — LABETALOL HCL 5 MG/ML IV SOLN
10.0000 mg | INTRAVENOUS | Status: DC | PRN
  Administered 2019-08-17: 10 mg via INTRAVENOUS
  Filled 2019-08-15: qty 4

## 2019-08-15 MED ORDER — AMLODIPINE BESYLATE 10 MG PO TABS
10.0000 mg | ORAL_TABLET | Freq: Every day | ORAL | Status: DC
  Administered 2019-08-15 – 2019-08-18 (×4): 10 mg via ORAL
  Filled 2019-08-15 (×4): qty 1

## 2019-08-15 MED ORDER — CHLORHEXIDINE GLUCONATE CLOTH 2 % EX PADS
6.0000 | MEDICATED_PAD | Freq: Every day | CUTANEOUS | Status: DC
  Administered 2019-08-17 – 2019-08-19 (×3): 6 via TOPICAL

## 2019-08-15 MED ORDER — HYDROCHLOROTHIAZIDE 25 MG PO TABS
25.0000 mg | ORAL_TABLET | Freq: Every day | ORAL | Status: DC
  Administered 2019-08-15 – 2019-08-16 (×2): 25 mg via ORAL
  Filled 2019-08-15 (×2): qty 1

## 2019-08-15 MED ORDER — POTASSIUM CHLORIDE ER 10 MEQ PO TBCR
10.0000 meq | EXTENDED_RELEASE_TABLET | Freq: Every day | ORAL | Status: DC
  Administered 2019-08-15 – 2019-08-16 (×2): 10 meq via ORAL
  Filled 2019-08-15 (×4): qty 1

## 2019-08-15 MED ORDER — MAGNESIUM SULFATE 2 GM/50ML IV SOLN
2.0000 g | Freq: Once | INTRAVENOUS | Status: AC
  Administered 2019-08-15: 2 g via INTRAVENOUS
  Filled 2019-08-15: qty 50

## 2019-08-15 NOTE — Progress Notes (Signed)
Tele calling, states patient had 20 beats of vtach. Patient denies chest pain or dyspnea, states she feels nauseated. Assisted patient to change position, vital signs stable as documented. MD notified via secure page.

## 2019-08-15 NOTE — Consult Note (Signed)
Referring Physician:  GENISE STRACK is an 74 y.o. female.                       Chief Complaint: elevated HS-troponin I in patient with hemorrhagic stroke  HPI: 74 year old white female with end stage COPD and home oxygen therapy, a.fib, PVD, s/p left fem-pop bypass and hyperlpidemia was admitted with acute stroke on 08/10/2019. She was readmitted to rehab unit on 08/13/2019 and readmitted to [facility] for respiratory distress, pulmonary edema and elevated Troponin I levels. Her CBC and BMET are near normal this AM. Her HS-Troponin I is 920, 848 and 389 ng. Her EKG is sinus rhythm with lateral wall ischemia. EKG this AM is pending. Her echocardiogram on admission showed preserved LV systolic function with mild MR and TR. Patient is currently awake, responsive, follow commands and has left sided weakness. She denies chest pain.  Past Medical History:  Diagnosis Date  . COPD (chronic obstructive pulmonary disease) (HCC)   . High cholesterol   . On home oxygen therapy    "2L; all the time" (04/06/2016)  . Pneumonia    "when I was a kid"   . Urinary hesitancy       Past Surgical History:  Procedure Laterality Date  . ABDOMINAL AORTOGRAM N/A 04/08/2016   Procedure: Abdominal Aortogram;  Surgeon: Sherren Kerns, MD;  Location: [facility] INVASIVE CV LAB;  Service: Cardiovascular;  Laterality: N/A;  . CARDIAC CATHETERIZATION N/A 11/28/2014   Procedure: Right/Left Heart Cath and Coronary Angiography;  Surgeon: [name], MD;  Location: [facility] INVASIVE CV LAB;  Service: Cardiovascular;  Laterality: N/A;  . CARDIAC CATHETERIZATION  08/2006   Hattie Perch 08/25/2006  . DRESSING CHANGE UNDER ANESTHESIA Left 04/12/2016   Procedure: DRESSING CHANGE LEFT HIP AND LEFT HEEL;  Surgeon: Fransisco Hertz, MD;  Location: [facility] OR;  Service: Vascular;  Laterality: Left;  . FEMORAL-POPLITEAL BYPASS GRAFT Left 04/12/2016   Procedure: LEFT FEMORAL-POPITEAL  BYPASS GRAFT;  Surgeon: Fransisco Hertz, MD;  Location: [facility] OR;  Service: Vascular;   Laterality: Left;  . JOINT REPLACEMENT    . LOWER EXTREMITY ANGIOGRAPHY Bilateral 04/08/2016   Procedure: Lower Extremity Angiography;  Surgeon: Sherren Kerns, MD;  Location: [facility] INVASIVE CV LAB;  Service: Cardiovascular;  Laterality: Bilateral;  . PERIPHERAL VASCULAR INTERVENTION Bilateral 04/08/2016   Procedure: Peripheral Vascular Intervention;  Surgeon: Sherren Kerns, MD;  Location: [facility] INVASIVE CV LAB;  Service: Cardiovascular;  Laterality: Bilateral;  common iliac  . TOTAL HIP ARTHROPLASTY Left 01/29/2016  . VEIN HARVEST Left 04/12/2016   Procedure: LEFT GREATER SAPHENOUS VEIN HARVEST;  Surgeon: Fransisco Hertz, MD;  Location: [facility] OR;  Service: Vascular;  Laterality: Left;    Family History  Problem Relation Age of Onset  . Heart attack Father    Social History:  reports that she quit smoking about 5 years ago. Her smoking use included cigarettes. She has a 55.00 pack-year smoking history. She has never used smokeless tobacco. She reports that she does not drink alcohol and does not use drugs.  Allergies:  Allergies  Allergen Reactions  . Clonidine Derivatives Other (See Comments)    hypotension    Medications Prior to Admission  Medication Sig Dispense Refill  . acetaminophen (TYLENOL) 500 MG tablet Take 500 mg by mouth 2 (two) times daily as needed for mild pain.    Marland Kitchen albuterol (PROVENTIL HFA;VENTOLIN HFA) 108 (90 BASE) MCG/ACT inhaler Inhale 2 puffs into the lungs every 6 (  six) hours as needed for wheezing or shortness of breath.    Marland Kitchen amLODipine (NORVASC) 5 MG tablet Take 1 tablet (5 mg total) by mouth daily.    Melene Muller ON 08/18/2019] aspirin 81 MG EC tablet Take 1 tablet (81 mg total) by mouth daily. 30 tablet 3  . atorvastatin (LIPITOR) 80 MG tablet Take 1 tablet (80 mg total) by mouth daily.    . carvedilol (COREG) 12.5 MG tablet Take 1 tablet (12.5 mg total) by mouth 2 (two) times daily with a meal. (Patient not taking: Reported on 08/10/2019) 60 tablet 3  . cefTRIAXone 1 g in  sodium chloride 0.9 % 100 mL Inject 1 g into the vein daily.    Marland Kitchen enoxaparin (LOVENOX) 40 MG/0.4ML injection Inject 0.4 mLs (40 mg total) into the skin daily. 0 mL   . Maltodextrin-Xanthan Gum (RESOURCE THICKENUP CLEAR) POWD Take 120 g by mouth as needed (nectar thick liquids).    . senna-docusate (SENOKOT-S) 8.6-50 MG tablet Take 1 tablet by mouth 2 (two) times daily.    . sodium chloride 0.9 % infusion Inject 50 mLs into the vein continuous. 1000 mL 0    Results for orders placed or performed during the hospital encounter of 08/14/19 (from the past 48 hour(s))  Glucose, capillary     Status: None   Collection Time: 08/14/19 11:25 AM  Result Value Ref Range   Glucose-Capillary 80 70 - 99 mg/dL    Comment: Glucose reference range applies only to samples taken after fasting for at least 8 hours.  Glucose, capillary     Status: None   Collection Time: 08/14/19  3:59 PM  Result Value Ref Range   Glucose-Capillary 91 70 - 99 mg/dL    Comment: Glucose reference range applies only to samples taken after fasting for at least 8 hours.  Glucose, capillary     Status: None   Collection Time: 08/14/19  9:06 PM  Result Value Ref Range   Glucose-Capillary 79 70 - 99 mg/dL    Comment: Glucose reference range applies only to samples taken after fasting for at least 8 hours.  Glucose, capillary     Status: None   Collection Time: 08/15/19  7:39 AM  Result Value Ref Range   Glucose-Capillary 97 70 - 99 mg/dL    Comment: Glucose reference range applies only to samples taken after fasting for at least 8 hours.  CBC with Differential/Platelet     Status: None   Collection Time: 08/15/19  8:20 AM  Result Value Ref Range   WBC 9.3 4.0 - 10.5 K/uL   RBC 4.23 3.87 - 5.11 MIL/uL   Hemoglobin 12.2 12.0 - 15.0 g/dL   HCT 93.7 36 - 46 %   MCV 90.8 80.0 - 100.0 fL   MCH 28.8 26.0 - 34.0 pg   MCHC 31.8 30.0 - 36.0 g/dL   RDW 34.2 87.6 - 81.1 %   Platelets 269 150 - 400 K/uL   nRBC 0.0 0.0 - 0.2 %    Neutrophils Relative % 78 %   Neutro Abs 7.2 1.7 - 7.7 K/uL   Lymphocytes Relative 12 %   Lymphs Abs 1.1 0.7 - 4.0 K/uL   Monocytes Relative 8 %   Monocytes Absolute 0.8 0 - 1 K/uL   Eosinophils Relative 1 %   Eosinophils Absolute 0.1 0 - 0 K/uL   Basophils Relative 0 %   Basophils Absolute 0.0 0 - 0 K/uL   Immature Granulocytes 1 %  Abs Immature Granulocytes 0.07 0.00 - 0.07 K/uL    Comment: Performed at [facility], [address] [place], [place] [address]  Basic metabolic panel     Status: Abnormal   Collection Time: 08/15/19  8:20 AM  Result Value Ref Range   Sodium 142 135 - 145 mmol/L   Potassium 3.5 3.5 - 5.1 mmol/L   Chloride 106 98 - 111 mmol/L   CO2 26 22 - 32 mmol/L   Glucose, Bld 112 (H) 70 - 99 mg/dL    Comment: Glucose reference range applies only to samples taken after fasting for at least 8 hours.   BUN 11 8 - 23 mg/dL   Creatinine, Ser 6.040.93 0.44 - 1.00 mg/dL   Calcium 8.2 (L) 8.9 - 10.3 mg/dL   GFR calc non Af Amer >60 >60 mL/min   GFR calc Af Amer >60 >60 mL/min   Anion gap 10 5 - 15    Comment: Performed at [facility], [address] [place], [place] [address]   CT HEAD WO CONTRAST  Result Date: 08/14/2019 CLINICAL DATA:  Intraparenchymal hemorrhage, acute, progressive altered mental status EXAM: CT HEAD WITHOUT CONTRAST TECHNIQUE: Contiguous axial images were obtained from the base of the skull through the vertex without intravenous contrast. COMPARISON:  MRI 08/10/2019, CT 08/10/2019 FINDINGS: Brain: The intraparenchymal hematoma seen within the inferior left cerebellar hemisphere is unchanged measuring 9 mm x 12 mm. A small amount of surrounding vasogenic edema is again identified, stable since prior examination. Since the prior examination, there has been evolution of the infarct involving the right PCA territory involving the parasagittal right temporal lobe and right occipital lobe. Mild associated mass effect is present with effacement of  the overlying sulci. No superimposed acute hemorrhage in this region. Additionally, there is evolution of the a patchy infarcts within the right basal ganglia, primarily involving the right thalamus and medial lentiform nucleus. There has, however, developed a tiny focus of hemorrhage within the right globus pallidus. No significant midline shift. No extra-axial fluid collection. Ventricular size is normal. Vascular: Extensive atherosclerotic calcification noted within the terminal right vertebral artery and carotid siphons. Skull: Intact Sinuses/Orbits: Paranasal sinuses are clear. Orbits are unremarkable. Other: Mastoid air cells and middle ear cavities are clear. IMPRESSION: Interval evolution of large right PCA territory infarct involving the parasagittal temporal lobe, occipital lobe, and right basal ganglia. Tiny focus of probable petechial hemorrhage now evident within the right globus pallidus. Mild mass effect without midline shift. Stable intraparenchymal hematoma and associated edema involving the left cerebellar lobe. Electronically Signed   By: Helyn NumbersAshesh  Parikh MD   On: 08/14/2019 11:02   DG Chest Port 1 View  Result Date: 08/14/2019 CLINICAL DATA:  Acute respiratory distress EXAM: PORTABLE CHEST 1 VIEW COMPARISON:  08/10/2019 FINDINGS: Diffuse interstitial opacity which is new from before. There is asymmetry to the left at the apex which could be related to underlying emphysema. There may be pleural fluid given the hazy appearance at the left lung base. Normal heart size and stable mediastinal contours. No evidence of air leak IMPRESSION: Bilateral interstitial opacity, likely edema superimposed on the patient's emphysema. Electronically Signed   By: Marnee SpringJonathon  Watts M.D.   On: 08/14/2019 04:17    Review Of Systems Constitutional: No fever, chills, Chronic weight loss. Eyes: No vision change, wears glasses. No discharge or pain. Ears: Positive hearing loss, No tinnitus. Respiratory: No asthma,  Positive COPD, pneumonias and shortness of breath. No hemoptysis. Cardiovascular: Positive  chest pain, palpitation, leg edema. Gastrointestinal: No nausea, vomiting, diarrhea, constipation. No GI bleed. No hepatitis. Genitourinary: No dysuria, hematuria, kidney stone. No incontinance. Neurological: No headache, positive stroke, seizures.  Psychiatry: No psych facility admission for anxiety, depression, suicide. No detox. Skin: No rash. Musculoskeletal: Positive joint pain, no fibromyalgia. No neck pain, back pain. Lymphadenopathy: No lymphadenopathy. Hematology: No anemia or easy bruising.   Blood pressure (!) 188/79, pulse 100, temperature 98 F (36.7 C), temperature source Oral, resp. rate 20, height 5\' 4"  (1.626 m), weight 62.6 kg, SpO2 95 %. Body mass index is 23.69 kg/m. General appearance: alert, cooperative, appears stated age and mild respiratory distress Head: Normocephalic, atraumatic. Eyes: Hazel eyes, pink conjunctiva, corneas clear. PERRL, EOM's intact. Neck: No adenopathy, no carotid bruit, no JVD, supple, symmetrical, trachea midline and thyroid not enlarged. Resp: Clearing to auscultation bilaterally. Cardio: Regular rate and rhythm, S1, S2 normal, III/VI systolic murmur, no click, rub or gallop GI: Soft, non-tender; bowel sounds normal; no organomegaly. Extremities: No edema, cyanosis or clubbing. Skin: Warm and dry.  Neurologic: Alert and oriented X 2, left sided weakness.   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 Add HCTZ and potassium for BP and pulmonary edema. She is not a candidate for cardiac interventions at the present time. Repeat EKG.  Time spent: Review of old records, Lab, x-rays, EKG, other cardiac tests, examination, discussion with patient, referring doctor over 70  minutes.  , MD  08/15/2019, 9:20 AM

## 2019-08-15 NOTE — Plan of Care (Signed)
  Problem: Activity: Goal: Risk for activity intolerance will decrease Outcome: Progressing  Patient able to turn in bed with assist from staff, denies dyspnea.   Problem: Nutrition: Goal: Adequate nutrition will be maintained Outcome: Progressing  Patient able to tolerate meals with assist from staff/family and with thickened fluids.  Problem: Skin Integrity: Goal: Risk for impaired skin integrity will decrease Outcome: Progressing  No new areas of skin injury noted.

## 2019-08-15 NOTE — Discharge Summary (Signed)
Physician Discharge Summary  Patient ID: Caitlin Vasquez MRN: [id number] DOB/AGE: 76-Jan-1947 76 y.o.  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 COMPARISON:  MRI 08/10/2019, CT 08/10/2019 FINDINGS: Brain: The intraparenchymal hematoma seen within the inferior left cerebellar hemisphere is unchanged measuring 9 mm x 12 mm. A small amount of surrounding vasogenic edema is again identified, stable since prior examination. Since the prior examination, there has been evolution of the infarct involving the right PCA territory involving the parasagittal right temporal lobe and right occipital lobe. Mild associated mass effect is present with effacement of the overlying sulci. No superimposed acute hemorrhage in this region. Additionally, there is evolution of the a patchy infarcts within the right basal ganglia, primarily involving the right thalamus and medial lentiform nucleus. There has, however, developed a tiny focus of hemorrhage within the right globus pallidus. No significant midline shift. No extra-axial fluid collection. Ventricular size is normal. Vascular: Extensive atherosclerotic calcification noted within the terminal  right vertebral artery and carotid siphons. Skull: Intact Sinuses/Orbits: Paranasal sinuses are clear. Orbits are unremarkable. Other: Mastoid air cells and middle ear cavities are clear. IMPRESSION: Interval evolution of large right PCA territory infarct involving the parasagittal temporal lobe, occipital lobe, and right basal ganglia. Tiny focus of probable petechial hemorrhage now evident within the right globus pallidus. Mild mass effect without midline shift. Stable intraparenchymal hematoma and associated edema involving the left cerebellar lobe. Electronically Signed   By: Helyn Numbers MD   On: 08/14/2019 11:02    DG Chest Port 1 View  Result Date: 08/14/2019 CLINICAL DATA:  Acute respiratory distress EXAM: PORTABLE CHEST 1 VIEW COMPARISON:  08/10/2019 FINDINGS: Diffuse interstitial opacity which is new from before. There is asymmetry to the left at the apex which could be related to underlying emphysema. There may be pleural fluid given the hazy appearance at the left lung base. Normal heart size and stable mediastinal contours. No evidence of air leak IMPRESSION: Bilateral interstitial opacity, likely edema superimposed on the patient's emphysema. Electronically Signed   By: Marnee Spring M.D.   On: 08/14/2019 04:17     Labs:  Basic Metabolic Panel: Lab 08/10/19 2043 08/12/19 0736 08/13/19 0602 08/14/19 0524  NA 140 134* 139 141  K 3.3* 4.0 3.4* 5.1  CL 103 100 105 104  CO2 22 21* 24 24  GLUCOSE 175* 110* 103* 137*  BUN 13 14 12 11   CREATININE 1.08* 0.96 0.98 1.09*  CALCIUM 8.5* 8.2* 8.0* 8.6*  MG  --   --   --  2.0    CBC: Lab 08/13/19 0602 08/14/19 0524  WBC 7.8 21.8*  NEUTROABS  --  19.3*  HGB 10.6* 12.8  HCT 33.4* 40.5  MCV 89.3 90.8  PLT 210 261  CBG: Lab 08/14/19 0348 08/14/19 1125  GLUCAP 185* 80    Brief HPI:   Caitlin Vasquez is a 74 y.o. female with history of COPD, CAF-no AC, PAD who was admitted via [facility] on 08/10/2018 with reports of  headache, left inattention and difficulty walking.  She was found to have acute left cerebellar hemorrhage with intraventricular extension and was transferred to [facility] for management and further work-up.  UDS positive for opiates.  CTA head/neck was negative for spot sign of vascular abnormality and showed severe intracranial atherosclerosis throughout the head and neck.  MRI brain showed no change in small IPH left cerebellum with surrounding mass-effect, small amount of SAH and moderate ischemic infarct in right-PCA.  Dr. Johnsie Cancel felt that no surgical intervention was needed but recommended repeating MRI with/without contrast when able to to rule out underlying mass due to uncommon location of hemorrhage.  Patient was started on Cleviprex for BP control and has been weaned off.  She was found to be hypokalemic and had leukocytosis at admission with WBC-18.2.  She developed fevers even past admission with urine culture revealing E. coli UTI.  She was started on ceftriaxone for treatment.  Reactive leukocytosis had resolved.  Her blood pressures continue to be labile,norvasc and Coreg resumed but she was requiring IV labetalol prn.  She was started on dysphagia III nectar liquids due to aspiration of thins.  She was cleared to start Lovenox for DVT prophylaxis.  Therapy ongoing and patient continued to have limitations in mobility and ADLs.  CIR was recommended for follow-up therapy.    Hospital Course: Caitlin Vasquez was admitted to rehab 08/13/2019 for inpatient therapies to consist of PT, ST and OT at least three hours five days a week. Past admission physiatrist, therapy team and rehab RN have worked together to provide customized collaborative inpatient rehab. She continued to report HA as well as right flank pain which was treated with tylenol. Air mattress overlay was added due to DTI on sacrum.  Foley was removed and she was started on bladder program with bladder scans for PVR checks and I/O caths for  volumes > 350cc. She was transitioned to Keflex for UTI. On early arm of 07/07, she developed hypoxia with decline in mental status due to fluid overload. She required HF oxygen and respiratory status improved after 40 mg lasix for IV diuresis. She was weaned to 2 L per [place] but continued to be lethargy with increased in weakness and slurred speech. CT head was ordered due to concerns of new stroke and am labs done revealed leucocytosis. Dr. Ophelia Charter was consulted to transfer patient to acute for closer monitoring and due to concerns of urosepsis. She was discharged to progressive floor on 07/07.   Diet: dysphagia 3, nectar liquids.  Medications at discharge: 1. NOrvasc 5 mg po daily 2. Coreg 12.5 mg po bid. 3. Keflex 500 mg po tid. 4. Lovenox 40 mg SQ daily 5. Melatonin 3 mg po hs prn 6.  Protonix 40 mg po daily. 7. Kdure 20 meq po bid. 8. Senna S one tab po HS  Disposition: Acute hospital    Signed: Jacquelynn Cree 08/15/2019, 10:27 AM

## 2019-08-15 NOTE — Evaluation (Signed)
Clinical/Bedside Swallow Evaluation Patient Details  Name: Caitlin Vasquez MRN: [id number] Date of Birth: 05-07-1945  Today's Date: 08/15/2019 Time: SLP Start Time (ACUTE ONLY): 1022 SLP Stop Time (ACUTE ONLY): 1021 SLP Time Calculation (min) (ACUTE ONLY): 14 min  Past Medical History:  Past Medical History:  Diagnosis Date  . COPD (chronic obstructive pulmonary disease) (HCC)   . High cholesterol   . On home oxygen therapy    "2L; all the time" (04/06/2016)  . Pneumonia    "when I was a kid"   . Urinary hesitancy    Past Surgical History:  Past Surgical History:  Procedure Laterality Date  . ABDOMINAL AORTOGRAM N/A 04/08/2016   Procedure: Abdominal Aortogram;  Surgeon: Sherren Kerns, MD;  Location: [facility] INVASIVE CV LAB;  Service: Cardiovascular;  Laterality: N/A;  . CARDIAC CATHETERIZATION N/A 11/28/2014   Procedure: Right/Left Heart Cath and Coronary Angiography;  Surgeon: [name], MD;  Location: [facility] INVASIVE CV LAB;  Service: Cardiovascular;  Laterality: N/A;  . CARDIAC CATHETERIZATION  08/2006   Hattie Perch 08/25/2006  . DRESSING CHANGE UNDER ANESTHESIA Left 04/12/2016   Procedure: DRESSING CHANGE LEFT HIP AND LEFT HEEL;  Surgeon: Fransisco Hertz, MD;  Location: [facility] OR;  Service: Vascular;  Laterality: Left;  . FEMORAL-POPLITEAL BYPASS GRAFT Left 04/12/2016   Procedure: LEFT FEMORAL-POPITEAL  BYPASS GRAFT;  Surgeon: Fransisco Hertz, MD;  Location: [facility] OR;  Service: Vascular;  Laterality: Left;  . JOINT REPLACEMENT    . LOWER EXTREMITY ANGIOGRAPHY Bilateral 04/08/2016   Procedure: Lower Extremity Angiography;  Surgeon: Sherren Kerns, MD;  Location: [facility] INVASIVE CV LAB;  Service: Cardiovascular;  Laterality: Bilateral;  . PERIPHERAL VASCULAR INTERVENTION Bilateral 04/08/2016   Procedure: Peripheral Vascular Intervention;  Surgeon: Sherren Kerns, MD;  Location: [facility] INVASIVE CV LAB;  Service: Cardiovascular;  Laterality: Bilateral;  common iliac  . TOTAL HIP ARTHROPLASTY Left 01/29/2016  . VEIN  HARVEST Left 04/12/2016   Procedure: LEFT GREATER SAPHENOUS VEIN HARVEST;  Surgeon: Fransisco Hertz, MD;  Location: [facility] OR;  Service: Vascular;  Laterality: Left;   HPI:  Pt is a 74 y.o. female with medical history significant of COPD, Afib; PVD s/p L fem-pop bypass and B CIA stenting; and HLD.  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 She was admitted to rehab on 7/6 with ongoing mild AMS, left inattention, LUE weakness, and tendency to "slump to the left."  She developed acute SOB on 7/7 with sats 75% on 3L [place] O2. CXR 7/7: Bilateral interstitial opacity, likely edema superimposed on the patient's emphysema. CT head: Interval evolution of large right PCA territory infarct involving the parasagittal temporal lobe, occipital lobe, and right basal ganglia. MBS 7/5: mild oropharyngeal dysphagia due to impaired coordination, timing, and oral control. She does not fully masticate solid foods, although she does clear the pieces of food that aren't chewed. There is reduced posterior transit and decreased bolus cohesion with varying amounts of mild-moderate lingual residue across different boluses, aspiration of thin liquids with inconsistent sensation.    Assessment / Plan / Recommendation Clinical Impression  Pt was seen for bedside swallow evaluation with her son present. Oral mechanism exam was significant for left-sided facial weakness and reduced lingual strength and ROM. She presented with symptoms of oropharyngeal dysphagia characterized by prolonged mastication with regular texture solids with mild oral residue and signs of aspiration with thin liquids. A dysphagia 3 diet with nectar thick liquids is recommended  at this time. SLP will follow pt. SLP Visit Diagnosis: Dysphagia, oropharyngeal phase (R13.12)    Aspiration Risk  Mild aspiration risk    Diet Recommendation Dysphagia 3 (Mech soft);Nectar-thick liquid   Liquid Administration  via: Cup;Straw Medication Administration: Whole meds with puree Supervision: Staff to assist with self feeding Compensations: Slow rate;Small sips/bites Postural Changes: Seated upright at 90 degrees    Other  Recommendations Oral Care Recommendations: Oral care BID Other Recommendations: Order thickener from pharmacy;Prohibited food (jello, ice cream, thin soups);Remove water pitcher   Follow up Recommendations Inpatient Rehab      Frequency and Duration min 2x/week  2 weeks       Prognosis Prognosis for Safe Diet Advancement: Good Barriers to Reach Goals: Cognitive deficits      Swallow Study   General Date of Onset: 08/10/19 HPI: Pt is a 74 y.o. female with medical history significant of COPD, Afib; PVD s/p L fem-pop bypass and B CIA stenting; and HLD.  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 She was admitted to rehab on 7/6 with ongoing mild AMS, left inattention, LUE weakness, and tendency to "slump to the left."  She developed acute SOB on 7/7 with sats 75% on 3L [place] O2. CXR 7/7: Bilateral interstitial opacity, likely edema superimposed on the patient's emphysema. CT head: Interval evolution of large right PCA territory infarct involving the parasagittal temporal lobe, occipital lobe, and right basal ganglia. MBS 7/5: mild oropharyngeal dysphagia due to impaired coordination, timing, and oral control. She does not fully masticate solid foods, although she does clear the pieces of food that aren't chewed. There is reduced posterior transit and decreased bolus cohesion with varying amounts of mild-moderate lingual residue across different boluses, aspiration of thin liquids with inconsistent sensation.  Type of Study: Bedside Swallow Evaluation Previous Swallow Assessment: See HPI Diet Prior to this Study: NPO Temperature Spikes Noted: No Respiratory Status: Nasal cannula History of Recent Intubation:  No Behavior/Cognition: Alert;Cooperative;Requires cueing Oral Cavity Assessment: Within Functional Limits Oral Care Completed by SLP: No Oral Cavity - Dentition: Dentures, top;Dentures, bottom Vision: Impaired for self-feeding Self-Feeding Abilities: Needs assist Patient Positioning: Upright in bed;Postural control adequate for testing Baseline Vocal Quality: Normal Volitional Cough: Strong Volitional Swallow: Able to elicit    Oral/Motor/Sensory Function Overall Oral Motor/Sensory Function: Mild impairment Facial ROM: Reduced left;Suspected CN VII (facial) dysfunction Facial Symmetry: Abnormal symmetry left;Suspected CN VII (facial) dysfunction Facial Strength: Reduced left;Suspected CN VII (facial) dysfunction Facial Sensation: Within Functional Limits Lingual ROM: Reduced left;Suspected CN XII (hypoglossal) dysfunction Lingual Symmetry: Within Functional Limits Lingual Strength: Reduced;Suspected CN XII (hypoglossal) dysfunction Lingual Sensation: Within Functional Limits Velum: Within Functional Limits Mandible: Within Functional Limits   Ice Chips Ice chips: Impaired Presentation: Spoon Pharyngeal Phase Impairments: Throat Clearing - Immediate   Thin Liquid Thin Liquid: Impaired Presentation: Cup;Spoon Pharyngeal  Phase Impairments: Cough - Immediate;Cough - Delayed    Nectar Thick Nectar Thick Liquid: Within functional limits   Honey Thick Honey Thick Liquid: Not tested   Puree Puree: Within functional limits Presentation: Spoon   Solid     Solid: Impaired Oral Phase Impairments: Impaired mastication     Trevian Hayashida I. Vear Clock, MS, CCC-SLP Acute Rehabilitation Services Office number [phone] Pager [phone]  Scheryl Marten 08/15/2019,12:55 PM

## 2019-08-15 NOTE — Progress Notes (Signed)
PROGRESS NOTE    Caitlin Vasquez  UEA:[id number] DOB: 09-Aug-1945 DOA: 08/14/2019 PCP: Orpah CobbKadakia, Ajay, MD   Brief Narrative:  Patient is a 74 year old female with history of COPD on home oxygen, A. 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  On her previous hospitalization, she had hypertensive emergency and was treated with Cleviprex and started on Coreg and Norvasc.[facility] before admission here.  She developed acute respiratory distress and CIR and desaturated to 75% on 3 L oxygen nasal cannula.  BP was high.  Chest x-ray was concerning for pulmonary edema.  Respiratory status has improved after given Lasix and Nitropaste. Cardiology and neurology following. 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  Respiratory status improved after given Lasix, Nitropaste.  Currently she is on baseline oxygen requirement.Added HCTZ by cardiology  CVA/ICH: Patient was recently hospitalized with acute CVA with hemorrhagic transformation and was discharged to CIR.  Not on aspirin or any kind of anticoagulation.  CT head shows  interval evolution of large right PCA territory infarct involving the parasagittal temporal lobe, occipital lobe, and right basal ganglia,tinny focus of probable petechial hemorrhage  evident within the right globus pallidus. Mild mass effect without midline shift.Stable intraparenchymal hematoma and associated edema involving the left cerebellar lobe. Neurology following here. Continue monitoring on telemetry.  PT/OT/speech/nutrition consult.  Recommending CIR.  Speech recommended dysphagia 3 diet.  Hypertensive emergency:.  Currently on Coreg, amlodipine,added HCTZ.  Presented with acute pulmonary edema from hypertensive crisis. Patient was also given nitro glycerin patch and Lasix.  We will continue aggressive blood pressure management.  Blood pressure has improved.  Elevated troponin: Severely elevated troponin.  Denies any chest pain.  EKG shows ST changes on the lateral lead.  This could be associated with supply demand ischemia from severe hypertension, pulmonary edema.  Echocardiogram done on 08/11/19 showed normal ejection fraction with grade 1 diastolic dysfunction.  Cardiology following and recommending medical management.  COPD: On home oxygen at 2 L/min.  Currently on 3 L of oxygen per minute.  Paroxysmal A. fib: Currently rate is controlled.  On Coreg.  Not on anticoagulation due to recent subarachnoid hemorrhage.  Currently in normal sinus rhythm.  UTI: Presented with urinary symptoms, abnormal UA.  Has mild leukocytosis.  Culture of urine sent on 08/12/2019 showed 2 organisms: E. coli and Streptococcus Gallolyticus. started on Rocephin.We will change abx to oral on dc  Hyperlipidemia: Elevated LDL but not on statin due to ICH ? Will follow up with neurology  Hyperglycemia: Recent hemoglobin  A1c of 5.6.  Continue sliding scale insulin.             DVT prophylaxis:SCD Code Status: Full Family  Communication: Called son on phone, call not received Status is:  Inpatient  Remains inpatient appropriate because:Hemodynamically unstable   Dispo: The patient is from: CIR              Anticipated d/c is to: CIR              Anticipated d/c date is:1 day              Patient currently is not medically stable to d/c.     Consultants:Neurology  Procedures:None  Antimicrobials:  Anti-infectives (From admission, onward)   Start     Dose/Rate Route Frequency Ordered Stop   08/14/19 1200  cefTRIAXone (ROCEPHIN) 1 g in sodium chloride 0.9 % 100 mL IVPB     Discontinue     1 g 200 mL/hr over 30 Minutes Intravenous Every 24 hours 08/14/19 1107        Subjective: Patient seen and examined at the bedside this morning.  Blood pressure is mildly improved during my evaluation.  She denies any chest pain.  Very weak, deconditioned and debilitated and lying on the bed.  She has weakness on the left upper extremity.  Respiratory status looks stable.  Denies shortness of breath or cough.  Objective: Vitals:   08/14/19 1939 08/14/19 2318 08/15/19 0322 08/15/19 0737  BP: (!) 163/75 (!) 134/43 (!) 125/49 (!) 188/79  Pulse:      Resp:  20    Temp: 98.5 F (36.9 C) 98.3 F (36.8 C) 98.1 F (36.7 C) 98 F (36.7 C)  TempSrc: Oral Oral Oral Oral  SpO2:      Weight:      Height:        Intake/Output Summary (Last 24 hours) at 08/15/2019 0752 Last data filed at 08/15/2019 0437 Gross per 24 hour  Intake 914.09 ml  Output 775 ml  Net 139.09 ml   Filed Weights   08/14/19 1205  Weight: 62.6 kg    Examination:  General exam: Generalized weakness, chronically looking Respiratory system: Bilateral few basal crackles  cardiovascular system: S1 & S2 heard, RRR. No JVD, murmurs, rubs, gallops or clicks. No pedal edema. Gastrointestinal system: Abdomen is nondistended, soft and nontender. No organomegaly or masses felt. Normal bowel sounds heard. Central nervous system: Alert and oriented. Motor of 3+/5 on LUE,5/5 on all other extremities, weak left hand  grasp Extremities: No edema, no clubbing ,no cyanosis Skin: No rashes, lesions or ulcers,no icterus ,no pallor  Data Reviewed: I have personally reviewed following labs and imaging studies  CBC: Recent Labs  Lab 08/10/19 2043 08/12/19 0736 08/13/19 0602 08/14/19 0524  WBC 18.2* 11.3* 7.8 21.8*  NEUTROABS  --   --   --  19.3*  HGB 14.0 12.6 10.6* 12.8  HCT 42.7 33.7* 33.4* 40.5  MCV 87.5 86.4 89.3 90.8  PLT 302 272 210 261   Basic Metabolic Panel: Recent Labs  Lab 08/10/19 2043 08/12/19 0736 08/13/19 0602 08/14/19 0524  NA 140 134* 139 141  K 3.3* 4.0 3.4* 5.1  CL 103 100 105 104  CO2 22 21* 24 24  GLUCOSE 175* 110* 103* 137*  BUN 13 14 12 11   CREATININE 1.08* 0.96 0.98 1.09*  CALCIUM 8.5* 8.2* 8.0* 8.6*  MG  --   --   --  2.0   GFR: Estimated Creatinine Clearance: 38.5 mL/min (A) (by C-G formula based on SCr of 1.09 mg/dL (H)). Liver Function Tests: Recent Labs  Lab 08/10/19 2043 08/14/19 0524  AST 23  37  ALT 11 15  ALKPHOS 84 88  BILITOT 0.6 0.9  PROT 7.2 6.8  ALBUMIN 3.3* 3.0*   No results for input(s): LIPASE, AMYLASE in the last 168 hours. No results for input(s): AMMONIA in the last 168 hours. Coagulation Profile: Recent Labs  Lab 08/10/19 2043  INR 1.0   Cardiac Enzymes: No results for input(s): CKTOTAL, CKMB, CKMBINDEX, TROPONINI in the last 168 hours. BNP (last 3 results) No results for input(s): PROBNP in the last 8760 hours. HbA1C: No results for input(s): HGBA1C in the last 72 hours. CBG: Recent Labs  Lab 08/14/19 0348 08/14/19 1125 08/14/19 1559 08/14/19 2106 08/15/19 0739  GLUCAP 185* 80 91 79 97   Lipid Profile: No results for input(s): CHOL, HDL, LDLCALC, TRIG, CHOLHDL, LDLDIRECT in the last 72 hours. Thyroid Function Tests: No results for input(s): TSH, T4TOTAL, FREET4, T3FREE, THYROIDAB in the last 72 hours. Anemia Panel: No results for input(s): VITAMINB12, FOLATE, FERRITIN, TIBC, IRON, RETICCTPCT in the last 72  hours. Sepsis Labs: No results for input(s): PROCALCITON, LATICACIDVEN in the last 168 hours.  Recent Results (from the past 240 hour(s))  SARS Coronavirus 2 by RT PCR (hospital order, performed in [facility] hospital lab)     Status: None   Collection Time: 08/10/19  9:06 PM  Result Value Ref Range Status   SARS Coronavirus 2 NEGATIVE NEGATIVE Final    Comment: (NOTE) SARS-CoV-2 target nucleic acids are NOT DETECTED.  The SARS-CoV-2 RNA is generally detectable in upper and lower respiratory specimens during the acute phase of infection. The lowest concentration of SARS-CoV-2 viral copies this assay can detect is 250 copies / mL. A negative result does not preclude SARS-CoV-2 infection and should not be used as the sole basis for treatment or other patient management decisions.  A negative result may occur with improper specimen collection / handling, submission of specimen other than nasopharyngeal swab, presence of viral mutation(s) within the areas targeted by this assay, and inadequate number of viral copies (<250 copies / mL). A negative result must be combined with clinical observations, patient history, and epidemiological information.  Fact Sheet for Patients:   BoilerBrush.com.cy  Fact Sheet for Healthcare Providers: https://pope.com/  This test is not yet approved or  cleared by the [place] FDA and has been authorized for detection and/or diagnosis of SARS-CoV-2 by FDA under an Emergency Use Authorization (EUA).  This EUA will remain in effect (meaning this test can be used) for the duration of the COVID-19 declaration under Section 564(b)(1) of the Act, 21 U.S.C. section 360bbb-3(b)(1), unless the authorization is terminated or revoked sooner.  Performed at [facility], [address], [place], [place] [address]   MRSA PCR Screening     Status: None   Collection Time: 08/10/19 11:05 PM   Specimen: Nasal  Mucosa; Nasopharyngeal  Result Value Ref Range Status   MRSA by PCR NEGATIVE NEGATIVE Final    Comment:        The GeneXpert MRSA Assay (FDA approved for NASAL specimens only), is one component of a comprehensive MRSA colonization surveillance program. It is not intended to diagnose MRSA infection nor to guide or monitor treatment for MRSA infections. Performed at [facility], [address] [place], [place] [address]   Culture, blood (routine x 2)     Status: None (Preliminary result)   Collection Time: 08/11/19 12:10 AM   Specimen: BLOOD LEFT HAND  Result Value Ref Range Status   Specimen Description  BLOOD LEFT HAND  Final   Special Requests   Final    BOTTLES DRAWN AEROBIC AND ANAEROBIC Blood Culture adequate volume   Culture   Final    NO GROWTH 3 DAYS Performed at [facility], [address], [place], [place] [address]    Report Status PENDING  Incomplete  Culture, blood (routine x 2)     Status: None (Preliminary result)   Collection Time: 08/11/19 12:10 AM   Specimen: BLOOD RIGHT HAND  Result Value Ref Range Status   Specimen Description BLOOD RIGHT HAND  Final   Special Requests   Final    BOTTLES DRAWN AEROBIC ONLY Blood Culture adequate volume   Culture   Final    NO GROWTH 3 DAYS Performed at [facility], [address], [place], [place] [address]    Report Status PENDING  Incomplete  Urine Culture     Status: Abnormal   Collection Time: 08/12/19 10:35 AM   Specimen: Urine, Catheterized  Result Value Ref Range Status   Specimen Description URINE, CATHETERIZED  Final   Special Requests   Final    NONE Performed at [facility], [address] [place], [place] [address]    Culture (A)  Final    >=100,000 COLONIES/mL ESCHERICHIA COLI 40,000 COLONIES/mL STREPTOCOCCUS GALLOLYTICUS    Report Status 08/14/2019 FINAL  Final   Organism ID, Bacteria ESCHERICHIA COLI (A)  Final   Organism ID, Bacteria STREPTOCOCCUS GALLOLYTICUS (A)   Final      Susceptibility   Escherichia coli - MIC*    AMPICILLIN 4 SENSITIVE Sensitive     CEFAZOLIN <=4 SENSITIVE Sensitive     CEFTRIAXONE <=0.25 SENSITIVE Sensitive     CIPROFLOXACIN <=0.25 SENSITIVE Sensitive     GENTAMICIN <=1 SENSITIVE Sensitive     IMIPENEM <=0.25 SENSITIVE Sensitive     NITROFURANTOIN <=16 SENSITIVE Sensitive     TRIMETH/SULFA <=20 SENSITIVE Sensitive     AMPICILLIN/SULBACTAM <=2 SENSITIVE Sensitive     PIP/TAZO <=4 SENSITIVE Sensitive     * >=100,000 COLONIES/mL ESCHERICHIA COLI   Streptococcus gallolyticus - MIC*    PENICILLIN <=0.06 SENSITIVE Sensitive     CEFTRIAXONE <=0.12 SENSITIVE Sensitive     ERYTHROMYCIN >=8 RESISTANT Resistant     LEVOFLOXACIN >=16 RESISTANT Resistant     VANCOMYCIN <=0.12 SENSITIVE Sensitive     * 40,000 COLONIES/mL STREPTOCOCCUS GALLOLYTICUS         Radiology Studies: CT HEAD WO CONTRAST  Result Date: 08/14/2019 CLINICAL DATA:  Intraparenchymal hemorrhage, acute, progressive altered mental status EXAM: CT HEAD WITHOUT CONTRAST TECHNIQUE: Contiguous axial images were obtained from the base of the skull through the vertex without intravenous contrast. COMPARISON:  MRI 08/10/2019, CT 08/10/2019 FINDINGS: Brain: The intraparenchymal hematoma seen within the inferior left cerebellar hemisphere is unchanged measuring 9 mm x 12 mm. A small amount of surrounding vasogenic edema is again identified, stable since prior examination. Since the prior examination, there has been evolution of the infarct involving the right PCA territory involving the parasagittal right temporal lobe and right occipital lobe. Mild associated mass effect is present with effacement of the overlying sulci. No superimposed acute hemorrhage in this region. Additionally, there is evolution of the a patchy infarcts within the right basal ganglia, primarily involving the right thalamus and medial lentiform nucleus. There has, however, developed a tiny focus of hemorrhage  within the right globus pallidus. No significant midline shift. No extra-axial fluid collection. Ventricular size is normal. Vascular:  Extensive atherosclerotic calcification noted within the terminal right vertebral artery and carotid siphons. Skull: Intact Sinuses/Orbits: Paranasal sinuses are clear. Orbits are unremarkable. Other: Mastoid air cells and middle ear cavities are clear. IMPRESSION: Interval evolution of large right PCA territory infarct involving the parasagittal temporal lobe, occipital lobe, and right basal ganglia. Tiny focus of probable petechial hemorrhage now evident within the right globus pallidus. Mild mass effect without midline shift. Stable intraparenchymal hematoma and associated edema involving the left cerebellar lobe. Electronically Signed   By: Helyn Numbers MD   On: 08/14/2019 11:02   DG Chest Port 1 View  Result Date: 08/14/2019 CLINICAL DATA:  Acute respiratory distress EXAM: PORTABLE CHEST 1 VIEW COMPARISON:  08/10/2019 FINDINGS: Diffuse interstitial opacity which is new from before. There is asymmetry to the left at the apex which could be related to underlying emphysema. There may be pleural fluid given the hazy appearance at the left lung base. Normal heart size and stable mediastinal contours. No evidence of air leak IMPRESSION: Bilateral interstitial opacity, likely edema superimposed on the patient's emphysema. Electronically Signed   By: Marnee Spring M.D.   On: 08/14/2019 04:17        Scheduled Meds: . amLODipine  5 mg Oral Daily  . atorvastatin  80 mg Oral Daily  . carvedilol  12.5 mg Oral BID WC  . Chlorhexidine Gluconate Cloth  6 each Topical Daily  . insulin aspart  0-9 Units Subcutaneous TID WC  . senna-docusate  1 tablet Oral BID   Continuous Infusions: . sodium chloride 50 mL/hr at 08/14/19 1158  . cefTRIAXone (ROCEPHIN) IVPB 1 gram/100 mL NS (Mini-Bag Plus) 1 g (08/14/19 1406)     LOS: 1 day    Time spent: 35 mins.More than 50% of that  time was spent in counseling and/or coordination of care.      Burnadette Pop, MD [facility] P7/09/2019, 7:52 AM

## 2019-08-15 NOTE — Consult Note (Signed)
20 beat V-tach. No chest pain. Positive nausea. VS stable.  Plan:  IV magnesium. Check electrolytes and CBC in AM. EKG: Unchanged SR with lateral ischemia.  [name], MD 08/15/2019- 4: 13 PM

## 2019-08-15 NOTE — Progress Notes (Signed)
Occupational Therapy Evaluation Patient Details Name: Caitlin Vasquez MRN: [id number] DOB: 1945-02-09 Today's Date: 08/15/2019    History of Present Illness 74 y.o. 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 Pt was discharged to inpatient rehab 7/6 and return to floor 7/7, where yesterday evening she developed acute onset shortness of breath with oxygen sats dropping to 75% on 3 L nasal cannula.  Her BP was elevated at 210/85. A stat CT scan of the head was obtained which showed expected evolutionary changes in the large right PCA infarct and stable appearance of the left cerebellar hematoma without significant hydrocephalus or ventriculomegaly.    Clinical Impression   Pt presents with above diagnoses and is limited by the deficits noted below. Prior to hemorrhage, pt Modified Independent with BADLs and mobility using SPC. Pt presents now with extensive assist required for all ADLs and mobility. Pt with significant deficits in coordination, strength, endurance, proprioception, cognition, and vision. Pt requires Mod A to Total A x 2 for ADLs and Total A x 2 for mobility. Pt will require extensive physical assist on discharge. Will continue to follow acutely focusing initially on visual deficits, sitting balance, and coordination.     Follow Up Recommendations  CIR;Supervision/Assistance - 24 hour    Equipment Recommendations  3 in 1 bedside commode    Recommendations for Other Services Rehab consult     Precautions / Restrictions Precautions Precautions: Fall Precaution Comments: supplemental O2, L sided inattention, L visual field deficits Restrictions Weight Bearing Restrictions: No      Mobility Bed Mobility Overal bed mobility: Needs Assistance Bed Mobility: Supine to Sit;Sit to Supine;Rolling Rolling: Mod assist    Supine to sit: Total assist;+2 for physical assistance Sit to supine: Total assist;+2 for physical assistance   General bed mobility comments: Pt overall Total A x 2 for bed mobility, attempts to assist with task but limited by decreased coordination in extremities  Transfers Overall transfer level: Needs assistance Equipment used: 2 person hand held assist Transfers: Sit to/from Stand Sit to Stand: Total assist;+2 physical assistance         General transfer comment: Total A x 2 for sit to stand from high bed. Pt unable to maintain with L LE incoordination and decreased strength. Unable to safely attempt transfer to chair today    Balance Overall balance assessment: Needs assistance Sitting-balance support: Bilateral upper extremity supported Sitting balance-Leahy Scale: Poor Sitting balance - Comments: Pt feet unable to touch floor with sizewize bed. Overall extensive assist to maintain sitting balance with L lateral lean and occasional anterior lean Postural control: Right lateral lean;Other (comment) (anterior) Standing balance support: Bilateral upper extremity supported Standing balance-Leahy Scale: Zero Standing balance comment: Max A to Total A to maintain standing balance EOB                           ADL either performed or assessed with clinical judgement   ADL Overall ADL's : Needs assistance/impaired Eating/Feeding: Moderate assistance;Bed level   Grooming: Moderate assistance;Bed level   Upper Body Bathing: Maximal assistance;Bed level   Lower Body Bathing: Total assistance;Bed level   Upper Body Dressing : Maximal assistance;Bed level   Lower Body Dressing: Total assistance;Bed level   Toilet Transfer: Total assistance;+2 for physical assistance   Toileting- Clothing Manipulation and Hygiene: Total assistance;Bed level  General ADL Comments: Pt requires extensive assist for all ADLs due to expansion of CVA with impairments in  coordination, strength, cognition, vision, and endurance (secondary to inclusion of COPD diagnosis).      Vision Baseline Vision/History: Wears glasses Wears Glasses: At all times Patient Visual Report: Eye fatigue/eye pain/headache Vision Assessment?: Yes Eye Alignment: Within Functional Limits Ocular Range of Motion: Impaired-to be further tested in functional context Tracking/Visual Pursuits: Decreased smoothness of eye movement to LEFT superior field;Decreased smoothness of eye movement to LEFT inferior field;Requires cues, head turns, or add eye shifts to track;Decreased smoothness of vertical tracking;Decreased smoothness of horizontal tracking Saccades: Decreased speed of saccadic movement;Undershoots;Additional eye shifts occurred during testing Visual Fields: Left visual field deficit Additional Comments: Pt unable to cross midline with gaze, but able to turn head. difficulty focusing on objects on L side     Perception Perception Perception Tested?: Yes Perception Deficits: Inattention/neglect Inattention/Neglect: Does not attend to left visual field Spatial deficits: L inattention, poor proprioception and awareness of self in space   Praxis Praxis Praxis tested?: Deficits Deficits: Limb apraxia;Motor Impersistence Praxis-Other Comments: Pt unable to sustain L UE in position to assist in maintaining balance     Pertinent Vitals/Pain Pain Assessment: No/denies pain Faces Pain Scale: No hurt Pain Location: Head Pain Descriptors / Indicators: Discomfort;Grimacing Pain Intervention(s): Limited activity within patient's tolerance;Monitored during session (notified RN - pt NPO)     Hand Dominance Right   Extremity/Trunk Assessment Upper Extremity Assessment Upper Extremity Assessment: RUE deficits/detail;LUE deficits/detail RUE Deficits / Details: less impacted than L side, but with impaired gross motor coordination, 3+/5 strength RUE Sensation: WNL RUE Coordination:  decreased gross motor;decreased fine motor LUE Deficits / Details: Severely impaired coordination (gross & fine motor), ROM WFL with strength 3-/5. Impaired sensation and proprioception LUE Sensation: decreased light touch;decreased proprioception LUE Coordination: decreased fine motor;decreased gross motor   Lower Extremity Assessment Lower Extremity Assessment: Defer to PT evaluation LLE Sensation: decreased light touch;decreased proprioception LLE Coordination: decreased gross motor       Communication Communication Communication: No difficulties   Cognition Arousal/Alertness: Awake/alert Behavior During Therapy: Flat affect Overall Cognitive Status: Impaired/Different from baseline Area of Impairment: Attention;Following commands;Memory;Safety/judgement;Awareness;Problem solving                   Current Attention Level: Sustained Memory: Decreased recall of precautions Following Commands: Follows one step commands inconsistently;Follows one step commands with increased time Safety/Judgement: Decreased awareness of safety;Decreased awareness of deficits Awareness: Emergent Problem Solving: Slow processing;Decreased initiation;Difficulty sequencing;Requires verbal cues;Requires tactile cues General Comments: Pt unaware of L visual field deficits or impaired coordination. Pt A&Ox4 but with difficulty in awareness of extremities, safety needs   General Comments  SpO2 WFL on 3 L O2, BP elevated sitting EOB 141/114. After return back to supine, BP decreased 117/90. Pt previously able to take steps with therapy prior to evolutionary change with CVA. pt requiring more significant assist with tasks - son arrived at end of session and educated on this.     Exercises     Shoulder Instructions      Home Living Family/patient expects to be discharged to:: Private residence Living Arrangements: Children Available Help at Discharge: Family;Available 24 hours/day Type of Home:  House Home Access: Stairs to enter [employer] of Steps: 3 Entrance Stairs-Rails: Right;Left Home Layout: Two level;Able to live on main level with bedroom/bathroom     Bathroom Shower/Tub: Chief Strategy Officer: Standard     Home Equipment:  Gilmer Mor - single point      Lives With: Son    Prior Functioning/Environment Level of Independence: Needs assistance  Gait / Transfers Assistance Needed: pt reports ambulating independently for short household distances with use of cane ADL's / Homemaking Assistance Needed: pt reports requiring assistance for all IADLs, takes a sponge bath, performs ADLs independently            OT Problem List: Decreased strength;Decreased range of motion;Decreased activity tolerance;Impaired balance (sitting and/or standing);Impaired vision/perception;Decreased coordination;Decreased cognition;Decreased safety awareness;Decreased knowledge of use of DME or AE;Decreased knowledge of precautions;Impaired sensation;Impaired tone;Impaired UE functional use;Pain;Cardiopulmonary status limiting activity      OT Treatment/Interventions: Self-care/ADL training;Therapeutic exercise;Neuromuscular education;Energy conservation;DME and/or AE instruction;Therapeutic activities;Cognitive remediation/compensation;Visual/perceptual remediation/compensation;Patient/family education;Balance training    OT Goals(Current goals can be found in the care plan section) Acute Rehab OT Goals Patient Stated Goal: to be able to eat, get back to CIR and then home OT Goal Formulation: With patient Time For Goal Achievement: 08/29/19 Potential to Achieve Goals: Good  OT Frequency: Min 2X/week   Barriers to D/C:            Co-evaluation PT/OT/SLP Co-Evaluation/Treatment: Yes Reason for Co-Treatment: Complexity of the patient's impairments (multi-system involvement);Necessary to address cognition/behavior during functional activity;For patient/therapist  safety;To address functional/ADL transfers   OT goals addressed during session: ADL's and self-care;Strengthening/ROM      AM-PAC OT "6 Clicks" Daily Activity     Outcome Measure Help from another person eating meals?: A Lot Help from another person taking care of personal grooming?: A Lot Help from another person toileting, which includes using toliet, bedpan, or urinal?: Total Help from another person bathing (including washing, rinsing, drying)?: Total Help from another person to put on and taking off regular upper body clothing?: A Lot Help from another person to put on and taking off regular lower body clothing?: Total 6 Click Score: 9   End of Session Equipment Utilized During Treatment: Gait belt;Oxygen Nurse Communication: Mobility status;Other (comment) (need for new bed)  Activity Tolerance: Patient limited by fatigue;Patient tolerated treatment well Patient left: in bed;with call bell/phone within reach;Other (comment) (MD in room)  OT Visit Diagnosis: Unsteadiness on feet (R26.81);Other abnormalities of gait and mobility (R26.89);Muscle weakness (generalized) (M62.81);Ataxia, unspecified (R27.0);Low vision, both eyes (H54.2);Apraxia (R48.2);Other symptoms and signs involving cognitive function;Pain                Time: 4174-0814 OT Time Calculation (min): 30 min Charges:  OT General Charges $OT Visit: 1 Visit OT Evaluation $OT Eval High Complexity: 1 High  Lorre Munroe, OTR/L  Lorre Munroe 08/15/2019, 10:40 AM

## 2019-08-15 NOTE — Progress Notes (Signed)
Initial Nutrition Assessment  DOCUMENTATION CODES:   Non-severe (moderate) malnutrition in context of chronic illness  INTERVENTION:   - Vital Cuisine Shake TID, each supplement provides 520 kcal and 22 grams of protein  - Magic Cup TID with meals, each supplement provides 290 kcal and 9 grams of protein  - Feeding assistance and encourage adequate PO intake  NUTRITION DIAGNOSIS:   Moderate Malnutrition related to chronic illness (COPD) as evidenced by mild fat depletion, moderate fat depletion, mild muscle depletion, moderate muscle depletion.  GOAL:   Patient will meet greater than or equal to 90% of their needs  MONITOR:   PO intake, Supplement acceptance, Labs, Weight trends, Skin  REASON FOR ASSESSMENT:   Consult Other (stroke)  ASSESSMENT:   74 year old female with PMH of COPD on home oxygen, atrial fibrillation, PVD s/p L fem-pop bypass and B CIA stenting, HLD. 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 Pt was admitted to CIR on 7/06 with mild AMS, left inattention, LUE weakness. Pt developed acute SOB on 7/07 with CXR concerning for pulmonary edema and pt was admitted to acute care.   Spoke with pt at bedside. Noted untouched lunch meal tray at bedside. Discussed with RN who reports pt stated that her son was coming back and was going to feed her and therefore did not allow RN to feed her earlier. Spoke with pt who states she is ready to eat and does not know if her son is returning. Communicated this with RN.  Pt reports that she has had a good appetite and denies any changes in appetite. Pt reports that she typically eats 3 meals daily. Pt also denies any recent changes in weight.  Reviewed weight history in chart. Last weight prior to this month is from September 2018. Pt's current weight is about 8 kg more than weight recorded from September 2018 encounter.  Noted that pt may discharge back to CIR  tomorrow.  RD will order appropriately thickened oral nutrition supplements to aid pt in meeting kcal and protein needs during this admission.  Medications reviewed and include: SSI, Klor-con 10 mEq daily, senna, IV abx  Labs reviewed. CBG's: 79-97 x 24 hours  NUTRITION - FOCUSED PHYSICAL EXAM:    Most Recent Value  Orbital Region Moderate depletion  Upper Arm Region Mild depletion  Thoracic and Lumbar Region Mild depletion  Buccal Region Mild depletion  Temple Region Moderate depletion  Clavicle Bone Region Moderate depletion  Clavicle and Acromion Bone Region Moderate depletion  Scapular Bone Region Unable to assess  Dorsal Hand Mild depletion  Patellar Region Moderate depletion  Anterior Thigh Region Moderate depletion  Posterior Calf Region Moderate depletion  Edema (RD Assessment) None  Hair Reviewed  Eyes Reviewed  Mouth Reviewed  Skin Reviewed  Nails Reviewed       Diet Order:   Diet Order            DIET DYS 3 Room service appropriate? Yes with Assist; Fluid consistency: Nectar Thick  Diet effective now                 EDUCATION NEEDS:   No education needs have been identified at this time  Skin:  Skin Assessment: Skin Integrity Issues: Unstageable: buttocks Incisions: left arm, ribs  Last BM:  08/14/19  Height:   Ht Readings from Last 1 Encounters:  08/14/19 5\' 4"  (1.626 m)    Weight:   Wt Readings from Last 1 Encounters:  08/14/19 62.6 kg    Ideal Body Weight:  54.5 kg  BMI:  Body mass index is 23.69 kg/m.  Estimated Nutritional Needs:   Kcal:  1450-1650  Protein:  70-85 grams  Fluid:  1.4-1.6 L    Earma Reading, MS, RD, LDN Inpatient Clinical Dietitian Please see AMiON for contact information.

## 2019-08-15 NOTE — Evaluation (Signed)
Speech Language Pathology Evaluation Patient Details Name: Caitlin Vasquez MRN: [id number] DOB: 1945/08/10 Today's Date: 08/15/2019 Time: 4098-1191 SLP Time Calculation (min) (ACUTE ONLY): 18 min  Problem List:  Patient Active Problem List   Diagnosis Date Noted  . Extension of stroke (HCC) 08/15/2019  . Altered mental status 08/15/2019  . Hyperglycemia 08/14/2019  . Dyslipidemia 08/14/2019  . On home oxygen therapy 08/13/2019  . Atrial fibrillation (HCC) 08/13/2019  . Hypertensive emergency 08/13/2019  . E-coli UTI 08/13/2019  . Hypokalemia 08/13/2019  . Aortic atherosclerosis (HCC) 08/13/2019  . Cerebellar hemorrhage (HCC) 08/13/2019  . 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 Atherosclerosis of native artery of left lower extremity with gangrene (HCC) 05/20/2016  . Malnutrition of moderate degree 04/11/2016  . Acute left systolic heart failure (HCC) 04/06/2016  . Exertional dyspnea 11/28/2014  . COPD with chronic bronchitis and emphysema (HCC) 11/28/2014  . Chest pain at rest 11/28/2014  . Essential hypertension 11/28/2014   Past Medical History:  Past Medical History:  Diagnosis Date  . COPD (chronic obstructive pulmonary disease) (HCC)   . High cholesterol   . On home oxygen therapy    "2L; all the time" (04/06/2016)  . Pneumonia    "when I was a kid"   . Urinary hesitancy    Past Surgical History:  Past Surgical History:  Procedure Laterality Date  . ABDOMINAL AORTOGRAM N/A 04/08/2016   Procedure: Abdominal Aortogram;  Surgeon: Sherren Kerns, MD;  Location[facility] INVASIVE CV LAB;  Service: Cardiovascular;  Laterality: N/A;  . CARDIAC CATHETERIZATION N/A 11/28/2014   Procedure: Right/Left Heart Cath and Coronary Angiography;  Surgeon: [name], MD;  Location: [facility] INVASIVE CV LAB;  Service: Cardiovascular;  Laterality: N/A;  . CARDIAC CATHETERIZATION  08/2006   Hattie Perch  08/25/2006  . DRESSING CHANGE UNDER ANESTHESIA Left 04/12/2016   Procedure: DRESSING CHANGE LEFT HIP AND LEFT HEEL;  Surgeon: Fransisco Hertz, MD;  Location: [facility] OR;  Service: Vascular;  Laterality: Left;  . FEMORAL-POPLITEAL BYPASS GRAFT Left 04/12/2016   Procedure: LEFT FEMORAL-POPITEAL  BYPASS GRAFT;  Surgeon: Fransisco Hertz, MD;  Location: [facility] OR;  Service: Vascular;  Laterality: Left;  . JOINT REPLACEMENT    . LOWER EXTREMITY ANGIOGRAPHY Bilateral 04/08/2016   Procedure: Lower Extremity Angiography;  Surgeon: Sherren Kerns, MD;  Location: [facility] INVASIVE CV LAB;  Service: Cardiovascular;  Laterality: Bilateral;  . PERIPHERAL VASCULAR INTERVENTION Bilateral 04/08/2016   Procedure: Peripheral Vascular Intervention;  Surgeon: Sherren Kerns, MD;  Location: [facility] INVASIVE CV LAB;  Service: Cardiovascular;  Laterality: Bilateral;  common iliac  . TOTAL HIP ARTHROPLASTY Left 01/29/2016  . VEIN HARVEST Left 04/12/2016   Procedure: LEFT GREATER SAPHENOUS VEIN HARVEST;  Surgeon: Fransisco Hertz, MD;  Location: [facility] OR;  Service: Vascular;  Laterality: Left;   HPI:  Pt is a 74 y.o. female with medical history significant of COPD, Afib; PVD s/p L fem-pop bypass and B CIA stenting; and HLD.  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 She was admitted to rehab on 7/6 with ongoing mild AMS, left inattention, LUE weakness, and tendency to "slump to the left."  She developed acute SOB on 7/7 with sats 75% on 3L [place] O2. CXR 7/7: Bilateral interstitial opacity, likely edema superimposed on the patient's emphysema. CT head: Interval evolution of large right PCA territory infarct  involving the parasagittal temporal lobe, occipital lobe, and right basal ganglia. MBS 7/5: mild oropharyngeal dysphagia due to impaired coordination, timing, and oral control. She does not fully masticate solid foods, although she does clear the pieces of food that aren't chewed. There is reduced  posterior transit and decreased bolus cohesion with varying amounts of mild-moderate lingual residue across different boluses, aspiration of thin liquids with inconsistent sensation.    Assessment / Plan / Recommendation Clinical Impression  Pt participated in speech/language/cognition evaluation with her son, Sharia Reeve, present. 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 Pt denied any acute changes in speech, language, or cognition and pt's son reported that her cognition is improved compared to that at the time of admission to CIR. [facility] Mental Status Examination was completed to evaluate the pt's cognitive-linguistic skills. She achieved a score of 14/30 which is below the normal limits of 25 or more out of 30. She exhibited difficulty in the areas of awareness, attention, memory, and executive function. No speech/language deficits were noted, but auditory comprehension was impacted by impairments in attention and memory. Acute SLP services are not warranted for cognitive-linguistic function but may be initiated at the next venue of care. SLP will follow for swallowing.     SLP Assessment  SLP Recommendation/Assessment: All further Speech Lanaguage Pathology  needs can be addressed in the next venue of care SLP Visit Diagnosis: Cognitive communication deficit (R41.841)    Follow Up Recommendations  Inpatient Rehab    Frequency and Duration min 2x/week         SLP Evaluation Cognition  Overall Cognitive Status: Impaired/Different from baseline Arousal/Alertness: Awake/alert Orientation Level: Oriented X4 Attention: Focused;Sustained Focused Attention: Appears intact Sustained Attention: Impaired Sustained Attention Impairment: Verbal complex Memory: Impaired Memory Impairment: Storage deficit;Retrieval deficit;Decreased recall of new information (Immediate: 5/5; delayed: 3/5) Awareness: Impaired Awareness Impairment: Intellectual  impairment Problem Solving: Impaired Problem Solving Impairment: Verbal complex Executive Function: Organizing;Sequencing Sequencing: Impaired Sequencing Impairment: Verbal complex (clock drawing: 0/4) Organizing: Impaired Organizing Impairment: Verbal complex (Backward digit span: 1/3)       Comprehension  Auditory Comprehension Overall Auditory Comprehension: Appears within functional limits for tasks assessed Yes/No Questions: Within Functional Limits Commands: Within Functional Limits Conversation: Simple Visual Recognition/Discrimination Discrimination: Within Function Limits Reading Comprehension Reading Status: Not tested    Expression Expression Primary Mode of Expression: Verbal Verbal Expression Overall Verbal Expression: Appears within functional limits for tasks assessed Initiation: No impairment Automatic Speech: Name;Social Response Level of Generative/Spontaneous Verbalization: Conversation Pragmatics: No impairment Interfering Components: Attention Written Expression Dominant Hand: Right   Oral / Motor  Oral Motor/Sensory Function Overall Oral Motor/Sensory Function: Mild impairment Facial ROM: Reduced left;Suspected CN VII (facial) dysfunction Facial Symmetry: Abnormal symmetry left;Suspected CN VII (facial) dysfunction Facial Strength: Reduced left;Suspected CN VII (facial) dysfunction Facial Sensation: Within Functional Limits Lingual ROM: Reduced left;Suspected CN XII (hypoglossal) dysfunction Lingual Symmetry: Within Functional Limits Lingual Strength: Reduced;Suspected CN XII (hypoglossal) dysfunction Lingual Sensation: Within Functional Limits Velum: Within Functional Limits Mandible: Within Functional Limits Motor Speech Overall Motor Speech: Appears within functional limits for tasks assessed Respiration: Within functional limits Phonation: Normal Resonance: Within functional limits Articulation: Within functional limitis Intelligibility:  Intelligible Motor Planning: Witnin functional limits Motor Speech Errors: Not applicable   Wilmoth Rasnic I. Vear Clock, MS, CCC-SLP Acute Rehabilitation Services Office number [phone] Pager [phone]                    Yvone Neu I  Vear Clock 08/15/2019, 1:13 PM

## 2019-08-15 NOTE — Evaluation (Addendum)
Physical Therapy Evaluation Patient Details Name: Caitlin Vasquez MRN: [id number] DOB: 22-Jul-1945 Today's Date: 08/15/2019   History of Present Illness  74 y.o. 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 Pt was discharged to inpatient rehab 7/6 and return to floor 7/7, where yesterday evening she developed acute onset shortness of breath with oxygen sats dropping to 75% on 3 L nasal cannula.  Her BP was elevated at 210/85. A stat CT scan of the head was obtained which showed expected evolutionary changes in the large right PCA infarct and stable appearance of the left cerebellar hematoma without significant hydrocephalus or ventriculomegaly.   Clinical Impression   Pt presents with significant incoordination L>R, L inattention with inability to cross midline, hemi-ballismus-type movements of LUE/LE suspect due to impaired tone and ataxia, weakness L>R, poor and zero sitting and standing balance respectively, and decreased activity tolerance. Pt to benefit from acute PT to address deficits. Prior to pt transfer to CIR, pt ambulating short distances with max +2, pt today required total +2 for bed mobility and static standing limited by impaired tone, incoordination, and weakness. PT continuing to recommend CIR post-acutely to maximize functional recovery post-CVA, per chart review CIR still following for possible re-admit. Pt's son present at end of session and very supportive. PT to progress mobility as tolerated, and will continue to follow acutely.   Of note, pt with elevated troponins but per RN okay to mobilize. Pt with no chest pain at any point during session, cardiology following and states "She is not a candidate for cardiac interventions at the present time".    Follow Up Recommendations CIR;Supervision/Assistance - 24 hour     Equipment Recommendations  Wheelchair (measurements PT);Wheelchair cushion (measurements PT);Hospital bed;Other (comment) (vs defer to next venue)    Recommendations for Other Services Rehab consult     Precautions / Restrictions Precautions Precautions: Fall Precaution Comments: supplemental O2, L sided inattention, L visual field deficits Restrictions Weight Bearing Restrictions: No      Mobility  Bed Mobility Overal bed mobility: Needs Assistance Bed Mobility: Sit to Supine;Rolling;Sidelying to Sit Rolling: Mod assist Sidelying to sit: Total assist;+2 for physical assistance;+2 for safety/equipment;HOB elevated Supine to sit: Total assist;+2 for physical assistance Sit to supine: Total assist;+2 for physical assistance   General bed mobility comments: Pt overall Total A x 2 for bed mobility, good initiation of tasks but <25% participation given severe incoordination in limbs L>R.  Transfers Overall transfer level: Needs assistance Equipment used: 2 person hand held assist Transfers: Sit to/from Stand Sit to Stand: Total assist;+2 physical assistance         General transfer comment: Total A x 2 for sit to stand from high bed. Pt tolerated stand ~45 seconds, able to weight shift R. Requires total blocking LLE with assist to keep LE grounded as pt with flexor synergy when attempting WB. Unable to safely transfer this day.  Ambulation/Gait             General Gait Details: unable this day  Stairs            Wheelchair Mobility    Modified Rankin (Stroke Patients Only) Modified Rankin (Stroke Patients Only) Pre-Morbid Rankin Score: Moderate disability Modified Rankin: Severe disability     Balance Overall balance assessment: Needs assistance Sitting-balance support: Bilateral upper extremity supported Sitting balance-Leahy Scale: Poor Sitting balance - Comments: Pt feet unable  to touch floor with sizewize bed. Overall extensive assist to maintain  sitting balance with L lateral lean and occasional anterior leaning. Posture responds well to sternal cuing and posterior facilitation (therapist sitting behind pt) Postural control: Right lateral lean;Other (comment) (anterior) Standing balance support: Bilateral upper extremity supported Standing balance-Leahy Scale: Zero Standing balance comment: Max A to Total A +2 to maintain standing balance EOB                             Pertinent Vitals/Pain Pain Assessment: Faces Faces Pain Scale: Hurts even more Pain Location: head, back, neck Pain Descriptors / Indicators: Discomfort;Grimacing Pain Intervention(s): Limited activity within patient's tolerance;Monitored during session;Repositioned    Home Living Family/patient expects to be discharged to:: Private residence Living Arrangements: Children Available Help at Discharge: Family;Available 24 hours/day (lives with son who can provide physical assist, also has other son and daughter-in-law as needed) Type of Home: House Home Access: Stairs to enter Entrance Stairs-Rails: Doctor, general practice of Steps: 3 Home Layout: Two level;Able to live on main level with bedroom/bathroom Home Equipment: Cane - single point      Prior Function Level of Independence: Needs assistance   Gait / Transfers Assistance Needed: pt reports ambulating independently for short household distances with use of cane  ADL's / Homemaking Assistance Needed: pt reports requiring assistance for all IADLs, takes a sponge bath, performs ADLs independently        Hand Dominance   Dominant Hand: Right    Extremity/Trunk Assessment   Upper Extremity Assessment Upper Extremity Assessment: Defer to OT evaluation RUE Deficits / Details: less impacted than L side, but with impaired gross motor coordination, 3+/5 strength RUE Sensation: WNL RUE Coordination: decreased gross motor;decreased fine motor LUE Deficits / Details: Severely  impaired coordination (gross & fine motor), insensate LUE Sensation: decreased light touch;decreased proprioception LUE Coordination: decreased fine motor;decreased gross motor    Lower Extremity Assessment Lower Extremity Assessment: RLE deficits/detail;LLE deficits/detail RLE Deficits / Details: mild incoordination, +rebound when executing LE AROM. ~3/5 for MMT LE, not formally tested given incoordination LLE Deficits / Details: max incoordination and ataxic movement, + flexor synergy during mobility esp WB. ~3/5 for MMT, not formally tested given incoordination and + rebound during LE AROM LLE Sensation: decreased light touch;decreased proprioception LLE Coordination: decreased gross motor;decreased fine motor    Cervical / Trunk Assessment Cervical / Trunk Assessment: Other exceptions Cervical / Trunk Exceptions: L lateral and anterior leaning  Communication   Communication: No difficulties  Cognition Arousal/Alertness: Awake/alert Behavior During Therapy: Flat affect Overall Cognitive Status: Impaired/Different from baseline Area of Impairment: Attention;Following commands;Memory;Safety/judgement;Awareness;Problem solving                   Current Attention Level: Sustained Memory: Decreased recall of precautions Following Commands: Follows one step commands with increased time Safety/Judgement: Decreased awareness of safety;Decreased awareness of deficits Awareness: Emergent Problem Solving: Slow processing;Decreased initiation;Difficulty sequencing;Requires verbal cues;Requires tactile cues General Comments: Pt A&Ox4 but with poor awareness of limbs L>R with significantly impaired coordination. A&Ox4, but does report she lives with her husband who per son is deceased. Follows simple commands as able given deficits.      General Comments General comments (skin integrity, edema, etc.): SpO2 WFL on 3 L O2, BP elevated sitting EOB 141/114. After return back to supine, BP  decreased 117/90. Pt previously able to take steps with therapy prior to evolutionary change with CVA. pt requiring more  significant assist with tasks - son arrived at end of session and educated on this.    Exercises     Assessment/Plan    PT Assessment Patient needs continued PT services  PT Problem List Decreased activity tolerance;Decreased balance;Decreased mobility;Decreased coordination;Decreased cognition;Decreased knowledge of use of DME;Decreased safety awareness;Decreased knowledge of precautions;Cardiopulmonary status limiting activity;Impaired sensation;Impaired tone;Decreased strength;Pain       PT Treatment Interventions DME instruction;Gait training;Functional mobility training;Therapeutic activities;Therapeutic exercise;Balance training;Neuromuscular re-education;Cognitive remediation;Patient/family education    PT Goals (Current goals can be found in the Care Plan section)  Acute Rehab PT Goals Patient Stated Goal: to be able to eat, get back to CIR and then home PT Goal Formulation: With patient Time For Goal Achievement: 08/29/19 Potential to Achieve Goals: Fair    Frequency Min 4X/week   Barriers to discharge        Co-evaluation   Reason for Co-Treatment: Complexity of the patient's impairments (multi-system involvement);Necessary to address cognition/behavior during functional activity;For patient/therapist safety;To address functional/ADL transfers   OT goals addressed during session: ADL's and self-care;Strengthening/ROM       AM-PAC PT "6 Clicks" Mobility  Outcome Measure Help needed turning from your back to your side while in a flat bed without using bedrails?: A Lot Help needed moving from lying on your back to sitting on the side of a flat bed without using bedrails?: Total Help needed moving to and from a bed to a chair (including a wheelchair)?: Total Help needed standing up from a chair using your arms (e.g., wheelchair or bedside chair)?:  Total Help needed to walk in hospital room?: Total Help needed climbing 3-5 steps with a railing? : Total 6 Click Score: 7    End of Session Equipment Utilized During Treatment: Gait belt;Oxygen Activity Tolerance: Patient limited by fatigue;No increased pain Patient left: in bed;with call bell/phone within reach (MD in room) Nurse Communication: Mobility status PT Visit Diagnosis: Hemiplegia and hemiparesis Hemiplegia - Right/Left: Left Hemiplegia - dominant/non-dominant: Non-dominant Hemiplegia - caused by: Cerebral infarction;Nontraumatic intracerebral hemorrhage    Time: 0926-1001 PT Time Calculation (min) (ACUTE ONLY): 35 min   Charges:   PT Evaluation $PT Eval Moderate Complexity: 1 Mod          Monai Hindes E, PT Acute Rehabilitation Services Pager [phone]  Office [phone]  Dillian Feig D Deunta Beneke 08/15/2019, 11:17 AM

## 2019-08-15 NOTE — Progress Notes (Signed)
Inpatient Rehabilitation-Admissions Coordinator   Per PT and OT evaluations, pt continues to have CIR needs. AC will place CIR consult order in the chart per our protocol to allow [place] to continue to follow for possible readmit back to IP Rehab.    Cheri Rous, OTR/L  Rehab Admissions Coordinator  [phone] 08/15/2019 12:36 PM

## 2019-08-15 NOTE — Progress Notes (Signed)
Inpatient Rehabilitation-Admissions Coordinator    Pt had a brief stay on IP Rehab (7/6-7/7) prior to being transferred back to acute due to medical concerns. Will follow to see how pt does with therapy sessions today to see if a return to CIR is warranted when medically ready.   Cheri Rous, OTR/L  Rehab Admissions Coordinator  [phone] 08/15/2019 9:40 AM

## 2019-08-16 ENCOUNTER — Encounter (HOSPITAL_COMMUNITY): Payer: Self-pay | Admitting: Internal Medicine

## 2019-08-16 LAB — CULTURE, BLOOD (ROUTINE X 2)
Culture: NO GROWTH
Culture: NO GROWTH
Special Requests: ADEQUATE
Special Requests: ADEQUATE

## 2019-08-16 LAB — BASIC METABOLIC PANEL
Anion gap: 12 (ref 5–15)
BUN: 10 mg/dL (ref 8–23)
CO2: 27 mmol/L (ref 22–32)
Calcium: 8.5 mg/dL — ABNORMAL LOW (ref 8.9–10.3)
Chloride: 101 mmol/L (ref 98–111)
Creatinine, Ser: 0.98 mg/dL (ref 0.44–1.00)
GFR calc Af Amer: >60 mL/min (ref >60)
GFR calc non Af Amer: 56 mL/min — ABNORMAL LOW (ref >60)
Glucose, Bld: 107 mg/dL — ABNORMAL HIGH (ref 70–99)
Potassium: 3.5 mmol/L (ref 3.5–5.1)
Sodium: 140 mmol/L (ref 135–145)

## 2019-08-16 LAB — GLUCOSE, CAPILLARY
Glucose-Capillary: 117 mg/dL — ABNORMAL HIGH (ref 70–99)
Glucose-Capillary: 126 mg/dL — ABNORMAL HIGH (ref 70–99)
Glucose-Capillary: 138 mg/dL — ABNORMAL HIGH (ref 70–99)
Glucose-Capillary: 134 mg/dL — ABNORMAL HIGH (ref 70–99)

## 2019-08-16 LAB — MAGNESIUM: Magnesium: 2.2 mg/dL (ref 1.7–2.4)

## 2019-08-16 MED ORDER — POTASSIUM CHLORIDE CRYS ER 10 MEQ PO TBCR
10.0000 meq | EXTENDED_RELEASE_TABLET | Freq: Two times a day (BID) | ORAL | Status: DC
  Administered 2019-08-17 – 2019-08-19 (×5): 10 meq via ORAL
  Filled 2019-08-16 (×6): qty 1

## 2019-08-16 MED ORDER — CEFDINIR 300 MG PO CAPS
300.0000 mg | ORAL_CAPSULE | Freq: Two times a day (BID) | ORAL | Status: AC
  Administered 2019-08-16 – 2019-08-18 (×6): 300 mg via ORAL
  Filled 2019-08-16 (×6): qty 1

## 2019-08-16 MED ORDER — POTASSIUM CHLORIDE CRYS ER 20 MEQ PO TBCR
60.0000 meq | EXTENDED_RELEASE_TABLET | Freq: Once | ORAL | Status: AC
  Administered 2019-08-16: 60 meq via ORAL
  Filled 2019-08-16: qty 3

## 2019-08-16 MED ORDER — HYDROCHLOROTHIAZIDE 12.5 MG PO CAPS
12.5000 mg | ORAL_CAPSULE | Freq: Every day | ORAL | Status: DC
  Administered 2019-08-17 – 2019-08-19 (×3): 12.5 mg via ORAL
  Filled 2019-08-16 (×3): qty 1

## 2019-08-16 NOTE — Progress Notes (Signed)
PROGRESS NOTE    Caitlin Vasquez  QZR:[id number] DOB: [birth year] DOA: 08/14/2019 PCP: [name], MD   Brief Narrative:  Patient is a 74 year old female with history of COPD on home oxygen, A. 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 fib, not on anticoagulation presented and readmitted for respiratory distress in CIR.  On her previous hospitalization, she had hypertensive emergency and was treated with Cleviprex and started on Coreg and Norvasc.She was just discharged to [facility] by neurology a day before admission here.  She developed acute respiratory distress and CIR and desaturated to 75% on 3 L oxygen nasal cannula.  BP was high.  Chest x-ray was concerning for pulmonary edema.  Respiratory status has improved after given Lasix and Nitropaste. Cardiology and neurology were consulted . Now the plan is to discharge her back to CIR on Monday.  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  Respiratory status improved after given Lasix, Nitropaste.  Currently she is on baseline oxygen requirement.Added HCTZ by cardiology  CVA/ICH: Patient was recently hospitalized with acute CVA with hemorrhagic transformation and was discharged to CIR.  Not on aspirin or any kind of anticoagulation.   CT head shows interval evolution of large right PCA territory infarct involving the parasagittal temporal lobe, occipital lobe, and right basal ganglia,tinny focus of probable petechial hemorrhage  evident within the right globus pallidus. Mild mass effect without midline shift.Stable intraparenchymal hematoma and associated edema involving the left cerebellar lobe. Neurology was  following here.  Currently she is alert and oriented Continue monitoring on telemetry.  PT/OT/speech/nutrition consult done.  Recommending CIR.  Speech recommended dysphagia 3 diet.  Hypertensive emergency:.  Currently on Coreg, amlodipine,added HCTZ.  Presented with acute pulmonary edema from hypertensive crisis. Patient was also given nitro glycerin patch and Lasix.  We will continue aggressive blood pressure management.  Blood pressure has improved.  Elevated troponin: Severely elevated troponin.  Denies any chest pain.  EKG shows ST changes on the lateral lead.  This could be associated with supply demand ischemia from severe hypertension, pulmonary edema.  Echocardiogram done on 08/11/19 showed normal ejection fraction with grade 1 diastolic dysfunction.  Cardiology following and recommending medical management.  COPD: On home oxygen at 2 L/min.  Currently on 2-3 L of oxygen per minute.  Lungs were clear on auscultation today.  Paroxysmal A. fib: Currently rate is controlled.  On Coreg.  Not on anticoagulation due to recent subarachnoid hemorrhage.  Currently in normal sinus rhythm.  V. tach: Patient had a run of V. tach yesterday and an event of SVT today.  Will continue to monitor electrolytes and supplement as necessary.  Monitor on telemetry  UTI: Presented with urinary symptoms, abnormal UA.  Has mild leukocytosis.  Culture of urine sent on 08/12/2019 showed 2 organisms: E. coli and Streptococcus Gallolyticus. started on Rocephin.We will  change abx to oral to complete 5 days course  Hyperlipidemia: On lipitor 80 mg  daily  Hyperglycemia: Recent hemoglobin  A1c of 5.6.  Continue sliding scale insulin.      Nutrition Problem: Moderate Malnutrition Etiology: chronic illness (COPD)      DVT prophylaxis:SCD Code Status: Full Family Communication: Called and discussed with  son on phone Status is: Inpatient  Remains inpatient appropriate because:Unsafe DC   Dispo: The patient is from: CIR              Anticipated d/c is to: CIR              Anticipated d/c date is:On monday              Patient currently is not medically stable to d/c.  See needs monitoring for next 24 to 48 hours.  Had  episoded of VT/SVT   Consultants:Neurology,Cardiology  Procedures:None  Antimicrobials:  Anti-infectives (From admission, onward)   Start     Dose/Rate Route Frequency Ordered Stop   08/14/19 1200  cefTRIAXone (ROCEPHIN) 1 g in sodium chloride 0.9 % 100 mL IVPB     Discontinue     1 g 200 mL/hr over 30 Minutes Intravenous Every 24 hours 08/14/19 1107        Subjective: Patient seen and examined at the bedside this morning.  Hemodynamically stable.  On 2 L of oxygen per minute.  Denies any cough or shortness of breath.  Remains weak,no new complains  Objective: Vitals:   08/16/19 0741 08/16/19 1020 08/16/19 1021 08/16/19 1146  BP: (!) 168/65 (!) 152/67 (!) 152/67 (!) 137/52  Pulse: 66 71  72  Resp: 17 16  20   Temp: 98.4 F (36.9 C)   98.6 F (37 C)  TempSrc: Axillary   Axillary  SpO2: 98%   96%  Weight:      Height:        Intake/Output Summary (Last 24 hours) at 08/16/2019 1158 Last data filed at 08/15/2019 2315 Gross per 24 hour  Intake 390 ml  Output 950 ml  Net -560 ml   Filed Weights   08/14/19 1205  Weight: 62.6 kg    Examination:  General exam: Generalized weakness, chronically looking HEENT:PERRL,Oral mucosa moist, Ear/Nose normal on gross exam Respiratory system: No obvious wheezes or crackles  cardiovascular system: S1 & S2 heard, RRR. No JVD, murmurs, rubs, gallops or  clicks. Gastrointestinal system: Abdomen is nondistended, soft and nontender. No organomegaly or masses felt. Normal bowel sounds heard. Central nervous system: Alert and oriented. Motor of 3+/5 on LUE,5/5 on all other extremities, weak left hand grasp Extremities: No edema, no clubbing ,no cyanosis Skin: No rashes, lesions or ulcers,no icterus ,no pallor   Data Reviewed: I have personally reviewed following labs and imaging studies  CBC: Recent Labs  Lab 08/12/19 0736 08/13/19 0602 08/14/19 0524 08/15/19 0820 08/15/19 1632  WBC 11.3* 7.8 21.8* 9.3 11.1*  NEUTROABS  --   --  19.3* 7.2  --   HGB 12.6 10.6* 12.8 12.2 12.5  HCT 33.7* 33.4* 40.5 38.4 38.7  MCV 86.4 89.3 90.8 90.8 90.4  PLT 272 210 261 269 275   Basic Metabolic Panel: Recent Labs  Lab 08/12/19 0736 08/13/19 0602 08/14/19 0524 08/15/19 0820 08/16/19 0413  NA 134* 139 141 142 140  K 4.0 3.4* 5.1 3.5 3.5  CL 100 105 104 106 101  CO2 21* 24 24 26 27   GLUCOSE 110* 103* 137* 112* 107*  BUN  14 12 11 11 10   CREATININE 0.96 0.98 1.09* 0.93 0.98  CALCIUM 8.2* 8.0* 8.6* 8.2* 8.5*  MG  --   --  2.0  --  2.2   GFR: Estimated Creatinine Clearance: 42.8 mL/min (by C-G formula based on SCr of 0.98 mg/dL). Liver Function Tests: Recent Labs  Lab 08/10/19 2043 08/14/19 0524  AST 23 37  ALT 11 15  ALKPHOS 84 88  BILITOT 0.6 0.9  PROT 7.2 6.8  ALBUMIN 3.3* 3.0*   No results for input(s): LIPASE, AMYLASE in the last 168 hours. No results for input(s): AMMONIA in the last 168 hours. Coagulation Profile: Recent Labs  Lab 08/10/19 2043  INR 1.0   Cardiac Enzymes: No results for input(s): CKTOTAL, CKMB, CKMBINDEX, TROPONINI in the last 168 hours. BNP (last 3 results) No results for input(s): PROBNP in the last 8760 hours. HbA1C: No results for input(s): HGBA1C in the last 72 hours. CBG: Recent Labs  Lab 08/15/19 1210 08/15/19 1839 08/15/19 2140 08/16/19 0739 08/16/19 1145  GLUCAP 135* 160* 160* 117*  126*   Lipid Profile: No results for input(s): CHOL, HDL, LDLCALC, TRIG, CHOLHDL, LDLDIRECT in the last 72 hours. Thyroid Function Tests: No results for input(s): TSH, T4TOTAL, FREET4, T3FREE, THYROIDAB in the last 72 hours. Anemia Panel: No results for input(s): VITAMINB12, FOLATE, FERRITIN, TIBC, IRON, RETICCTPCT in the last 72 hours. Sepsis Labs: No results for input(s): PROCALCITON, LATICACIDVEN in the last 168 hours.  Recent Results (from the past 240 hour(s))  SARS Coronavirus 2 by RT PCR (hospital order, performed in [facility] hospital lab)     Status: None   Collection Time: 08/10/19  9:06 PM  Result Value Ref Range Status   SARS Coronavirus 2 NEGATIVE NEGATIVE Final    Comment: (NOTE) SARS-CoV-2 target nucleic acids are NOT DETECTED.  The SARS-CoV-2 RNA is generally detectable in upper and lower respiratory specimens during the acute phase of infection. The lowest concentration of SARS-CoV-2 viral copies this assay can detect is 250 copies / mL. A negative result does not preclude SARS-CoV-2 infection and should not be used as the sole basis for treatment or other patient management decisions.  A negative result may occur with improper specimen collection / handling, submission of specimen other than nasopharyngeal swab, presence of viral mutation(s) within the areas targeted by this assay, and inadequate number of viral copies (<250 copies / mL). A negative result must be combined with clinical observations, patient history, and epidemiological information.  Fact Sheet for Patients:   10/11/19  Fact Sheet for Healthcare Providers: BoilerBrush.com.cy  This test is not yet approved or  cleared by the https://pope.com/ FDA and has been authorized for detection and/or diagnosis of SARS-CoV-2 by FDA under an Emergency Use Authorization (EUA).  This EUA will remain in effect (meaning this test can be used) for the  duration of the COVID-19 declaration under Section 564(b)(1) of the Act, 21 U.S.C. section 360bbb-3(b)(1), unless the authorization is terminated or revoked sooner.  Performed at [facility], [address], [place], [place]   MRSA PCR Screening     Status: None   Collection Time: 08/10/19 11:05 PM   Specimen: Nasal Mucosa; Nasopharyngeal  Result Value Ref Range Status   MRSA by PCR NEGATIVE NEGATIVE Final    Comment:        The GeneXpert MRSA Assay (FDA approved for NASAL specimens only), is one component of a comprehensive MRSA colonization surveillance program. It is not intended to  diagnose MRSA infection nor to guide or monitor treatment for MRSA infections. Performed at [facility], [address] [place], [place] [address]   Culture, blood (routine x 2)     Status: None (Preliminary result)   Collection Time: 08/11/19 12:10 AM   Specimen: BLOOD LEFT HAND  Result Value Ref Range Status   Specimen Description BLOOD LEFT HAND  Final   Special Requests   Final    BOTTLES DRAWN AEROBIC AND ANAEROBIC Blood Culture adequate volume   Culture   Final    NO GROWTH 4 DAYS Performed at [facility], [address] [place], [place] [address]    Report Status PENDING  Incomplete  Culture, blood (routine x 2)     Status: None (Preliminary result)   Collection Time: 08/11/19 12:10 AM   Specimen: BLOOD RIGHT HAND  Result Value Ref Range Status   Specimen Description BLOOD RIGHT HAND  Final   Special Requests   Final    BOTTLES DRAWN AEROBIC ONLY Blood Culture adequate volume   Culture   Final    NO GROWTH 4 DAYS Performed at [facility], [address] [place], [place] [address]    Report Status PENDING  Incomplete  Urine Culture     Status: Abnormal   Collection Time: 08/12/19 10:35 AM   Specimen: Urine, Catheterized  Result Value Ref Range Status   Specimen Description URINE, CATHETERIZED  Final   Special Requests   Final     NONE Performed at [facility], [address] [place], [place] [address]    Culture (A)  Final    >=100,000 COLONIES/mL ESCHERICHIA COLI 40,000 COLONIES/mL STREPTOCOCCUS GALLOLYTICUS    Report Status 08/14/2019 FINAL  Final   Organism ID, Bacteria ESCHERICHIA COLI (A)  Final   Organism ID, Bacteria STREPTOCOCCUS GALLOLYTICUS (A)  Final      Susceptibility   Escherichia coli - MIC*    AMPICILLIN 4 SENSITIVE Sensitive     CEFAZOLIN <=4 SENSITIVE Sensitive     CEFTRIAXONE <=0.25 SENSITIVE Sensitive     CIPROFLOXACIN <=0.25 SENSITIVE Sensitive     GENTAMICIN <=1 SENSITIVE Sensitive     IMIPENEM <=0.25 SENSITIVE Sensitive     NITROFURANTOIN <=16 SENSITIVE Sensitive     TRIMETH/SULFA <=20 SENSITIVE Sensitive     AMPICILLIN/SULBACTAM <=2 SENSITIVE Sensitive     PIP/TAZO <=4 SENSITIVE Sensitive     * >=100,000 COLONIES/mL ESCHERICHIA COLI   Streptococcus gallolyticus - MIC*    PENICILLIN <=0.06 SENSITIVE Sensitive     CEFTRIAXONE <=0.12 SENSITIVE Sensitive     ERYTHROMYCIN >=8 RESISTANT Resistant     LEVOFLOXACIN >=16 RESISTANT Resistant     VANCOMYCIN <=0.12 SENSITIVE Sensitive     * 40,000 COLONIES/mL STREPTOCOCCUS GALLOLYTICUS         Radiology Studies: No results found.      Scheduled Meds: . amLODipine  10 mg Oral Daily  . atorvastatin  80 mg Oral Daily  . carvedilol  12.5 mg Oral BID WC  . Chlorhexidine Gluconate Cloth  6 each Topical Daily  . hydrochlorothiazide  25 mg Oral Daily  . insulin aspart  0-9 Units Subcutaneous TID WC  . potassium chloride  60 mEq Oral Once  . senna-docusate  1 tablet Oral BID   Continuous Infusions: . cefTRIAXone (ROCEPHIN) IVPB 1 gram/100 mL NS (Mini-Bag Plus) 1 g (08/15/19 1311)     LOS: 2 days    Time spent: 35 mins.More than 50% of  that time was spent in counseling and/or coordination of care.      Burnadette Pop, MD [facility] P7/10/2019, 11:58 AM

## 2019-08-16 NOTE — Progress Notes (Signed)
Occupational Therapy Treatment Patient Details Name: Caitlin Vasquez MRN: [id number] DOB: 03-10-1945 Today's Date: 08/16/2019    History of present illness 74 y.o. 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 Pt was discharged to inpatient rehab 7/6 and return to floor 7/7, where yesterday evening she developed acute onset shortness of breath with oxygen sats dropping to 75% on 3 L nasal cannula.  Her BP was elevated at 210/85. A stat CT scan of the head was obtained which showed expected evolutionary changes in the large right PCA infarct and stable appearance of the left cerebellar hematoma without significant hydrocephalus or ventriculomegaly.    OT comments  Pt progressing with OT goals and motivated to participate in PT/OT co-treat. Pt Max A x 2 to Total A x 2 for bed mobility. Pt continues to require Max A to maintain sitting balance EOB with decreased spontaneous movements of L UE this session. Guided pt in lateral WB and attempts to achieve midline for gaze. Pt Min A for grooming task EOB with physical support to maintain sitting balance. Pt limited by fatigue and increased SOB this session (SpO2 WFL). Will continue to focus on sitting balance and endurance in preparation for ADL transfer training.    Follow Up Recommendations  CIR;Supervision/Assistance - 24 hour    Equipment Recommendations  3 in 1 bedside commode    Recommendations for Other Services      Precautions / Restrictions Precautions Precautions: Fall Precaution Comments: supplemental O2, L sided inattention, L visual field deficits Restrictions Weight Bearing Restrictions: No       Mobility Bed Mobility Overal bed mobility: Needs Assistance Bed Mobility: Supine to Sit;Sit to Supine     Supine to sit: +2 for physical assistance;Max assist Sit to supine:  Total assist;+2 for physical assistance   General bed mobility comments: Max A x 2 to sit EOB with improved initiation of reaching L hand to bed rail and rolling to side for task  Transfers                      Balance Overall balance assessment: Needs assistance Sitting-balance support: Bilateral upper extremity supported Sitting balance-Leahy Scale: Poor Sitting balance - Comments: Pt with Max A continued for static sitting balance with tendency for anterior and L lateral lean                                   ADL either performed or assessed with clinical judgement   ADL Overall ADL's : Needs assistance/impaired     Grooming: Minimal assistance;Wash/dry face;Sitting Grooming Details (indicate cue type and reason): Min A for task of washing face with R hand sitting EOB. Pt requires extensive assist to maintain sitting balance EOB for task          Upper Body Dressing : Maximal assistance;Bed level Upper Body Dressing Details (indicate cue type and reason): Max A to don hospital gown bed level, high distractability and decreased coordination                    General ADL Comments: Pt requires extensive assist for all ADLs due to expansion of CVA with impairments in coordination, strength, cognition, vision, and endurance (secondary to inclusion of COPD diagnosis).      Vision   Vision Assessment?:  Yes Alignment/Gaze Preference: Gaze right Tracking/Visual Pursuits: Decreased smoothness of eye movement to LEFT superior field;Decreased smoothness of eye movement to LEFT inferior field;Requires cues, head turns, or add eye shifts to track;Decreased smoothness of vertical tracking;Decreased smoothness of horizontal tracking Additional Comments: Pt continues to require max tactile and verbal cues to attempt gaze and head turn to midline. Pt reports able to see therapist but does not appear to be focused on therapist face   Perception     Praxis       Cognition Arousal/Alertness: Awake/alert Behavior During Therapy: Flat affect Overall Cognitive Status: Impaired/Different from baseline Area of Impairment: Attention;Following commands;Memory;Safety/judgement;Awareness;Problem solving                   Current Attention Level: Sustained Memory: Decreased recall of precautions Following Commands: Follows one step commands with increased time Safety/Judgement: Decreased awareness of safety;Decreased awareness of deficits Awareness: Emergent Problem Solving: Slow processing;Decreased initiation;Difficulty sequencing;Requires verbal cues;Requires tactile cues General Comments: Pt A&Ox4, continues to have poor awareness of deficits and abilty to implement corrections to posture        Exercises     Shoulder Instructions       General Comments BP 160s/80s sitting EOB. SpO2 at 91% on 3 L O2. Pt RR up to 40s sitting EOB, 30s upon return to bed    Pertinent Vitals/ Pain       Pain Assessment: Faces Faces Pain Scale: Hurts a little bit Pain Location: generalized Pain Descriptors / Indicators: Discomfort;Grimacing Pain Intervention(s): Limited activity within patient's tolerance;Monitored during session;Premedicated before session  Home Living                                          Prior Functioning/Environment              Frequency  Min 2X/week        Progress Toward Goals  OT Goals(current goals can now be found in the care plan section)  Progress towards OT goals: Progressing toward goals  Acute Rehab OT Goals Patient Stated Goal: to be able to eat, get back to CIR and then home OT Goal Formulation: With patient Time For Goal Achievement: 08/29/19 Potential to Achieve Goals: Good ADL Goals Pt Will Perform Eating: with min assist;with adaptive utensils;sitting Pt Will Perform Grooming: with min assist;sitting Pt Will Perform Upper Body Bathing: with min assist;sitting Pt Will  Perform Lower Body Bathing: with min assist;sit to/from stand Pt Will Perform Upper Body Dressing: with min assist;sitting Pt Will Transfer to Toilet: with min assist;bedside commode Additional ADL Goal #1: Pt will locate 3/3 items in L visual field during ADL tasks with Min verbal cues Additional ADL Goal #2: Pt to improve static sitting balance to Min A for > 60 seconds in order to prepare for ADL transfers.  Plan Discharge plan remains appropriate    Co-evaluation    PT/OT/SLP Co-Evaluation/Treatment: Yes Reason for Co-Treatment: Complexity of the patient's impairments (multi-system involvement);Necessary to address cognition/behavior during functional activity;For patient/therapist safety   OT goals addressed during session: ADL's and self-care;Strengthening/ROM      AM-PAC OT "6 Clicks" Daily Activity     Outcome Measure   Help from another person eating meals?: A Lot Help from another person taking care of personal grooming?: A Lot Help from another person toileting, which includes using toliet, bedpan, or urinal?: Total Help from another person bathing (  including washing, rinsing, drying)?: Total Help from another person to put on and taking off regular upper body clothing?: A Lot Help from another person to put on and taking off regular lower body clothing?: Total 6 Click Score: 9    End of Session Equipment Utilized During Treatment: Oxygen  OT Visit Diagnosis: Unsteadiness on feet (R26.81);Other abnormalities of gait and mobility (R26.89);Muscle weakness (generalized) (M62.81);Ataxia, unspecified (R27.0);Low vision, both eyes (H54.2);Apraxia (R48.2);Other symptoms and signs involving cognitive function;Pain   Activity Tolerance Patient limited by fatigue   Patient Left in bed;with call bell/phone within reach;Other (comment) (with PT)   Nurse Communication          Time: 1607-3710 OT Time Calculation (min): 23 min  Charges: OT General Charges $OT Visit: 1  Visit OT Treatments $Therapeutic Activity: 8-22 mins  Lorre Munroe, OTR/L   Lorre Munroe 08/16/2019, 4:06 PM

## 2019-08-16 NOTE — Progress Notes (Signed)
Physical Therapy Treatment Patient Details Name: Caitlin Vasquez MRN: [id number] DOB: 1945-10-29 Today's Date: 08/16/2019    History of Present Illness 74 y.o. 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 Pt was discharged to inpatient rehab 7/6 and return to floor 7/7, where yesterday evening she developed acute onset shortness of breath with oxygen sats dropping to 75% on 3 L nasal cannula.  Her BP was elevated at 210/85. A stat CT scan of the head was obtained which showed expected evolutionary changes in the large right PCA infarct and stable appearance of the left cerebellar hematoma without significant hydrocephalus or ventriculomegaly.     PT Comments    Pt resting upon PT/OT arrival to room, but agreeable to mobility. Pt appears down today, stating "I hate my life" due to severe deficits secondary to CVAs, but works hard throughout session. PT focused on sitting balance, with focus on facilitating upright posture and midline. Pt participated well, requiring posterior leaning rest breaks against PT intermittently due to fatigue. Pt with dyspnea on exertion noted during sitting tasks, recovers well with rest. VSS, with no abnormal cardiac rhythms observed during session. PT to continue to recommend CIR, will continue to follow acutely.    Follow Up Recommendations  CIR;Supervision/Assistance - 24 hour     Equipment Recommendations  Wheelchair (measurements PT);Wheelchair cushion (measurements PT);Hospital bed;Other (comment) (vs defer to next venue)    Recommendations for Other Services       Precautions / Restrictions Precautions Precautions: Fall Precaution Comments: supplemental O2, L sided neglect (does not cross midline), L visual field deficits Restrictions Weight Bearing Restrictions: No    Mobility  Bed  Mobility Overal bed mobility: Needs Assistance Bed Mobility: Supine to Sit;Sit to Supine     Supine to sit: +2 for physical assistance;Max assist Sit to supine: Total assist;+2 for physical assistance   General bed mobility comments: max +2 for trunk translation, LE management, and scooting to and from EOB. Pt with good initiation and use of RUE, used bedrails to assist during bed mobility.  Transfers                 General transfer comment: not attempted this day  Ambulation/Gait                 Stairs             Wheelchair Mobility    Modified Rankin (Stroke Patients Only) Modified Rankin (Stroke Patients Only) Pre-Morbid Rankin Score: Moderate disability Modified Rankin: Severe disability     Balance Overall balance assessment: Needs assistance Sitting-balance support: Bilateral upper extremity supported Sitting balance-Leahy Scale: Poor Sitting balance - Comments: Mod-max assist for sitting balance, leans R and anterior when support lessened. PT facilitated anterior pelvic tilt out of sacral sitting with LE behind pt, and chest opening with sternal cues (PT sitting posterolateral to cue anteriorly and posteriorly). ~10 minutes EOB sitting, working on visual tracking, maintaining sitting balance statically, and bilateral lateral leaning back to upright       Standing balance comment: NT this day                            Cognition Arousal/Alertness: Awake/alert Behavior During Therapy: Flat affect Overall Cognitive Status: Impaired/Different from baseline Area of Impairment: Attention;Following commands;Memory;Safety/judgement;Awareness;Problem solving  Current Attention Level: Sustained Memory: Decreased recall of precautions Following Commands: Follows one step commands with increased time Safety/Judgement: Decreased awareness of safety;Decreased awareness of deficits Awareness: Emergent Problem Solving:  Slow processing;Decreased initiation;Difficulty sequencing;Requires verbal cues;Requires tactile cues General Comments: Pt A&Ox4, continues to have poor awareness of deficits and abilty to implement corrections to posture. Pt appears down today, states "I hate my life" when she was unable to lift LUE and perform task asked of her and calls her LUE her "alien arm"      Exercises Other Exercises Other Exercises: hip and knee flexion/extension with PT resisting concentric motion, x10 bilaterally    General Comments General comments (skin integrity, edema, etc.): BP 170/83, SpO2 91-92% sitting EOB on 3LO2, RR 30-40s during mobility with cues for rest      Pertinent Vitals/Pain Pain Assessment: Faces Faces Pain Scale: Hurts a little bit Pain Location: generalized Pain Descriptors / Indicators: Discomfort;Grimacing Pain Intervention(s): Limited activity within patient's tolerance;Monitored during session;Repositioned    Home Living                      Prior Function            PT Goals (current goals can now be found in the care plan section) Acute Rehab PT Goals Patient Stated Goal: to be able to eat, get back to CIR and then home PT Goal Formulation: With patient Time For Goal Achievement: 08/29/19 Potential to Achieve Goals: Fair Progress towards PT goals: Progressing toward goals    Frequency    Min 4X/week      PT Plan Current plan remains appropriate    Co-evaluation PT/OT/SLP Co-Evaluation/Treatment: Yes Reason for Co-Treatment: Complexity of the patient's impairments (multi-system involvement);For patient/therapist safety;To address functional/ADL transfers PT goals addressed during session: Mobility/safety with mobility;Balance;Strengthening/ROM OT goals addressed during session: ADL's and self-care;Strengthening/ROM      AM-PAC PT "6 Clicks" Mobility   Outcome Measure  Help needed turning from your back to your side while in a flat bed without using  bedrails?: A Lot Help needed moving from lying on your back to sitting on the side of a flat bed without using bedrails?: A Lot Help needed moving to and from a bed to a chair (including a wheelchair)?: Total Help needed standing up from a chair using your arms (e.g., wheelchair or bedside chair)?: Total Help needed to walk in hospital room?: Total Help needed climbing 3-5 steps with a railing? : Total 6 Click Score: 8    End of Session Equipment Utilized During Treatment: Gait belt;Oxygen Activity Tolerance: Patient limited by fatigue;No increased pain Patient left: in bed;with call bell/phone within reach;with bed alarm set;with nursing/sitter in room (NT in room for bladder scan) Nurse Communication: Mobility status;Other (comment) (needs SCDs) PT Visit Diagnosis: Hemiplegia and hemiparesis;Muscle weakness (generalized) (M62.81) Hemiplegia - Right/Left: Left Hemiplegia - dominant/non-dominant: Non-dominant Hemiplegia - caused by: Cerebral infarction;Nontraumatic intracerebral hemorrhage     Time: 1525-1555 PT Time Calculation (min) (ACUTE ONLY): 30 min  Charges:  $Neuromuscular Re-education: 8-22 mins                     Trysta Showman E, PT Acute Rehabilitation Services Pager [phone]  Office [phone]  Rishika Mccollom D Verlaine Embry 08/16/2019, 4:40 PM

## 2019-08-16 NOTE — Consult Note (Signed)
Ref: [name], MD   Subjective:  Awake. Shortness of breath with supplemental oxygen continues. She denies chest pain. Monitor shows sinus rhythm. Magnesium level is normal.  Objective:  Vital Signs in the last 24 hours: Temp:  [97.6 F (36.4 C)-99.3 F (37.4 C)] 98.6 F (37 C) (07/09 1146) Pulse Rate:  [65-87] 72 (07/09 1146) Cardiac Rhythm: Sinus bradycardia (07/09 0700) Resp:  [16-20] 20 (07/09 1146) BP: (127-178)/(50-90) 137/52 (07/09 1146) SpO2:  [95 %-99 %] 96 % (07/09 1146)  Physical Exam: BP Readings from Last 1 Encounters:  08/16/19 (!) 137/52     Wt Readings from Last 1 Encounters:  08/14/19 62.6 kg    Weight change:  Body mass index is 23.69 kg/m. HEENT: [place]/AT, Eyes-Hazel, Conjunctiva-Pink, Sclera-Non-icteric Neck: No JVD, No bruit, Trachea midline. Lungs:  Clearing, Bilateral. Cardiac:  Regular rhythm, normal S1 and S2, no S3. II/VI systolic murmur. Abdomen:  Soft, non-tender. BS present. Extremities:  No edema present. No cyanosis. No clubbing. CNS: AxOx1, Cranial nerves grossly intact, moves all 4 extremities with moderate left sided weakness..  Skin: Warm and dry.   Intake/Output from previous day: 07/08 0701 - 07/09 0700 In: 390 [P.O.:240; IV Piggyback:150] Out: 950 [Urine:950]    Lab Results: BMET    Component Value Date/Time   NA 140 08/16/2019 0413   NA 142 08/15/2019 0820   NA 141 08/14/2019 0524   K 3.5 08/16/2019 0413   K 3.5 08/15/2019 0820   K 5.1 08/14/2019 0524   CL 101 08/16/2019 0413   CL 106 08/15/2019 0820   CL 104 08/14/2019 0524   CO2 27 08/16/2019 0413   CO2 26 08/15/2019 0820   CO2 24 08/14/2019 0524   GLUCOSE 107 (H) 08/16/2019 0413   GLUCOSE 112 (H) 08/15/2019 0820   GLUCOSE 137 (H) 08/14/2019 0524   BUN 10 08/16/2019 0413   BUN 11 08/15/2019 0820   BUN 11 08/14/2019 0524   CREATININE 0.98 08/16/2019 0413   CREATININE 0.93 08/15/2019 0820   CREATININE 1.09 (H) 08/14/2019 0524   CALCIUM 8.5 (L) 08/16/2019 0413    CALCIUM 8.2 (L) 08/15/2019 0820   CALCIUM 8.6 (L) 08/14/2019 0524   GFRNONAA 56 (L) 08/16/2019 0413   GFRNONAA >60 08/15/2019 0820   GFRNONAA 50 (L) 08/14/2019 0524   GFRAA >60 08/16/2019 0413   GFRAA >60 08/15/2019 0820   GFRAA 58 (L) 08/14/2019 0524   CBC    Component Value Date/Time   WBC 11.1 (H) 08/15/2019 1632   RBC 4.28 08/15/2019 1632   HGB 12.5 08/15/2019 1632   HCT 38.7 08/15/2019 1632   PLT 275 08/15/2019 1632   MCV 90.4 08/15/2019 1632   MCH 29.2 08/15/2019 1632   MCHC 32.3 08/15/2019 1632   RDW 13.7 08/15/2019 1632   LYMPHSABS 1.1 08/15/2019 0820   MONOABS 0.8 08/15/2019 0820   EOSABS 0.1 08/15/2019 0820   BASOSABS 0.0 08/15/2019 0820   HEPATIC Function Panel Recent Labs    08/10/19 2043 08/14/19 0524  PROT 7.2 6.8   HEMOGLOBIN A1C No components found for: HGA1C,  MPG CARDIAC ENZYMES Lab Results  Component Value Date   TROPONINI <0.03 04/07/2016   TROPONINI <0.03 04/06/2016   TROPONINI <0.03 04/06/2016   BNP No results for input(s): PROBNP in the last 8760 hours. TSH No results for input(s): TSH in the last 8760 hours. CHOLESTEROL Recent Labs    08/10/19 2043  CHOL 260*    Scheduled Meds: . amLODipine  10 mg Oral Daily  .  atorvastatin  80 mg Oral Daily  . carvedilol  12.5 mg Oral BID WC  . cefdinir  300 mg Oral Q12H  . Chlorhexidine Gluconate Cloth  6 each Topical Daily  . hydrochlorothiazide  25 mg Oral Daily  . insulin aspart  0-9 Units Subcutaneous TID WC  . potassium chloride  60 mEq Oral Once  . 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 Continue medical treatment.   LOS: 2 days   Time spent including chart review, lab review, examination, discussion with patient : 30 min   [name]  MD  08/16/2019, 1:25 PM

## 2019-08-16 NOTE — Progress Notes (Signed)
Inpatient Rehab Admissions Coordinator:   Following for my colleague, Cheri Rous.  We do not have a bed available for this patient through the weekend.  Note workup for 20-beat run of vtach pending CBC this AM.  Tresa Endo will f/u with pt on Monday.   Estill Dooms, PT, DPT Admissions Coordinator [phone] 08/16/19  9:51 AM

## 2019-08-16 NOTE — Plan of Care (Signed)
°  Problem: Education: Goal: Knowledge of secondary prevention will improve Outcome: Progressing Goal: Knowledge of patient specific risk factors addressed and post discharge goals established will improve Outcome: Progressing   Problem: Activity: Goal: Risk for activity intolerance will decrease Outcome: Progressing   Problem: Nutrition: Goal: Adequate nutrition will be maintained Outcome: Progressing   Problem: Coping: Goal: Level of anxiety will decrease Outcome: Progressing   Problem: Skin Integrity: Goal: Risk for impaired skin integrity will decrease Outcome: Progressing

## 2019-08-17 LAB — BASIC METABOLIC PANEL
Anion gap: 11 (ref 5–15)
BUN: 12 mg/dL (ref 8–23)
CO2: 28 mmol/L (ref 22–32)
Calcium: 8.6 mg/dL — ABNORMAL LOW (ref 8.9–10.3)
Chloride: 102 mmol/L (ref 98–111)
Creatinine, Ser: 0.79 mg/dL (ref 0.44–1.00)
GFR calc Af Amer: >60 mL/min (ref >60)
GFR calc non Af Amer: >60 mL/min (ref >60)
Glucose, Bld: 114 mg/dL — ABNORMAL HIGH (ref 70–99)
Potassium: 4.3 mmol/L (ref 3.5–5.1)
Sodium: 141 mmol/L (ref 135–145)

## 2019-08-17 LAB — GLUCOSE, CAPILLARY
Glucose-Capillary: 107 mg/dL — ABNORMAL HIGH (ref 70–99)
Glucose-Capillary: 129 mg/dL — ABNORMAL HIGH (ref 70–99)
Glucose-Capillary: 146 mg/dL — ABNORMAL HIGH (ref 70–99)
Glucose-Capillary: 141 mg/dL — ABNORMAL HIGH (ref 70–99)

## 2019-08-17 LAB — MAGNESIUM: Magnesium: 1.9 mg/dL (ref 1.7–2.4)

## 2019-08-17 MED ORDER — LISINOPRIL 10 MG PO TABS
10.0000 mg | ORAL_TABLET | Freq: Every day | ORAL | Status: DC
  Administered 2019-08-17 – 2019-08-19 (×3): 10 mg via ORAL
  Filled 2019-08-17 (×3): qty 1

## 2019-08-17 MED ORDER — HYDRALAZINE HCL 20 MG/ML IJ SOLN: 10.0000 mg | Freq: Four times a day (QID) | INTRAMUSCULAR | Status: DC | PRN

## 2019-08-17 NOTE — Evaluation (Signed)
Physical Therapy Evaluation Patient Details Name: Caitlin Vasquez MRN: [id number] DOB: [birth year] Today's Date: 08/17/2019   History of Present Illness  74 y.o. 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 Pt was discharged to inpatient rehab 7/6 and return to floor 7/7, where yesterday evening she developed acute onset shortness of breath with oxygen sats dropping to 75% on 3 L nasal cannula.  Her BP was elevated at 210/85. A stat CT scan of the head was obtained which showed expected evolutionary changes in the large right PCA infarct and stable appearance of the left cerebellar hematoma without significant hydrocephalus or ventriculomegaly.   Clinical Impression  Patient did very well today, eager to work with PT, sat at EOB and performed seated balance activities along with functional tasks. Able to feed herself with min assist for trunk support, cues for Lt gaze, using RUE without assistance. Tolerated therapeutic exercises for LEs and LUE. Patient will continue to benefit from skilled physical therapy services to further improve independence with functional mobility.     Follow Up Recommendations CIR;Supervision/Assistance - 24 hour    Equipment Recommendations  Wheelchair (measurements PT);Wheelchair cushion (measurements PT);Hospital bed;Other (comment)    Recommendations for Other Services Rehab consult     Precautions / Restrictions Precautions Precautions: Fall Precaution Comments: supplemental O2, L sided inattention, L visual field deficits Restrictions Weight Bearing Restrictions: No      Mobility  Bed Mobility Overal bed mobility: Needs Assistance Bed Mobility: Rolling;Sidelying to Sit;Sit to Sidelying Rolling: Mod assist Sidelying to sit: Max assist     Sit to sidelying: Mod assist General bed mobility  comments: With guidance for LLE and LUE pt able to roll towards Rt side. Mod assist for LEs to progress forward off of the bed, and more assist for trunk support. Able to push through RUE to rise with some guidance. One upright pt pushing to Lt modestly. Able to lower self back onto rt shoulder with light assistance but mod assist for LEs back into bed. Practiced scooting up in bed, able to push with both legs if assisted into hooklying position and pull through RUE with rail.  Transfers                    Ambulation/Gait                Stairs            Wheelchair Mobility    Modified Rankin (Stroke Patients Only) Modified Rankin (Stroke Patients Only) Pre-Morbid Rankin Score: Moderate disability Modified Rankin: Severe disability     Balance Overall balance assessment: Needs assistance Sitting-balance support: Bilateral upper extremity supported Sitting balance-Leahy Scale: Poor Sitting balance - Comments: Slight leftward push. able to reach towrds right side and partially recover to midline with practice. showed quick progression. Postural control: Left lateral lean (anterior)                                   Pertinent Vitals/Pain Pain Assessment: Faces Faces Pain Scale: Hurts little more Pain Location: Head Pain Descriptors / Indicators: Constant;Aching Pain Intervention(s): Limited activity within patient's tolerance;Monitored during session;Repositioned    Home Living                        Prior Function  Hand Dominance        Extremity/Trunk Assessment                Communication      Cognition Arousal/Alertness: Awake/alert Behavior During Therapy: Flat affect Overall Cognitive Status: Impaired/Different from baseline Area of Impairment: Attention;Following commands;Memory;Safety/judgement;Awareness;Problem solving                   Current Attention Level: Sustained Memory:  Decreased recall of precautions Following Commands: Follows one step commands consistently;Follows multi-step commands inconsistently Safety/Judgement: Decreased awareness of safety;Decreased awareness of deficits Awareness: Emergent Problem Solving: Slow processing;Decreased initiation;Difficulty sequencing;Requires verbal cues;Requires tactile cues General Comments: Pt A&Ox4, continues to have poor awareness of deficits and abilty to implement corrections to posture      General Comments General comments (skin integrity, edema, etc.): Tolerated 15 min upright at EOB with assistance. Able to feed her self with RUE, verbal and visual cues for Lt gaze to find food and utinsils. Showing carry over towards end of session. Fatigued after eating approx 70% of lunch. SpO2 90-92% during therapy session on supplemental O2.    Exercises Other Exercises Other Exercises: Seated LAQ AROM Rt, AAROM Lt x10 Other Exercises: Seated march AROM RT, AAROM Lt x10 ea Other Exercises: Seated midline correction x5 min leaning left and right, turning head towards Lt to identify targets and reache with RUE Other Exercises: LUE and LE elevation supine in bed when repositioning, pt able to perform with proximal strength in LUE greater than distal strength.   Assessment/Plan    PT Assessment    PT Problem List         PT Treatment Interventions      PT Goals (Current goals can be found in the Care Plan section)  Acute Rehab PT Goals Patient Stated Goal: to be able to eat, get back to CIR and then home PT Goal Formulation: With patient Time For Goal Achievement: 08/29/19 Potential to Achieve Goals: Fair    Frequency Min 4X/week   Barriers to discharge        Co-evaluation               AM-PAC PT "6 Clicks" Mobility  Outcome Measure Help needed turning from your back to your side while in a flat bed without using bedrails?: A Lot Help needed moving from lying on your back to sitting on the side  of a flat bed without using bedrails?: A Lot Help needed moving to and from a bed to a chair (including a wheelchair)?: Total Help needed standing up from a chair using your arms (e.g., wheelchair or bedside chair)?: Total Help needed to walk in hospital room?: Total Help needed climbing 3-5 steps with a railing? : Total 6 Click Score: 8    End of Session Equipment Utilized During Treatment: Oxygen Activity Tolerance: Patient tolerated treatment well Patient left: in bed;with call bell/phone within reach;with bed alarm set Nurse Communication: Mobility status PT Visit Diagnosis: Hemiplegia and hemiparesis;Muscle weakness (generalized) (M62.81) Hemiplegia - Right/Left: Left Hemiplegia - dominant/non-dominant: Non-dominant Hemiplegia - caused by: Cerebral infarction;Nontraumatic intracerebral hemorrhage    Time: 1478-2956 PT Time Calculation (min) (ACUTE ONLY): 33 min   Charges:     PT Treatments $Therapeutic Activity: 8-22 mins $Neuromuscular Re-education: 8-22 mins        Charlsie Merles, PT   [place] Mount 08/17/2019, 3:42 PM

## 2019-08-17 NOTE — Progress Notes (Signed)
PROGRESS NOTE    Caitlin Vasquez  ZOX:[id number] DOB: [birth year] DOA: 08/14/2019 PCP: [name], MD   Brief Narrative:  Patient is a 74 year old female with history of COPD on home oxygen, A. 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 fib, not on anticoagulation presented and readmitted for respiratory distress in CIR.  On her previous hospitalization, she had hypertensive emergency and was treated with Cleviprex and started on Coreg and Norvasc.She was just discharged to [facility] by neurology a day before admission here.  She developed acute respiratory distress and CIR and desaturated to 75% on 3 L oxygen nasal cannula.  BP was high.  Chest x-ray was concerning for pulmonary edema.  Respiratory status has improved after given Lasix and Nitropaste. Cardiology and neurology were consulted . Now the plan is to discharge her back to CIR on Monday.  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  Respiratory status improved after given Lasix, Nitropaste.  Currently she is on baseline oxygen requirement. Added HCTZ by cardiology  CVA/ICH: Patient was recently hospitalized with acute CVA with hemorrhagic transformation and was discharged to CIR.  Not on aspirin or any kind of  anticoagulation.  CT head shows interval evolution of large right PCA territory infarct involving the parasagittal temporal lobe, occipital lobe, and right basal ganglia,tinny focus of probable petechial hemorrhage  evident within the right globus pallidus. Mild mass effect without midline shift. Stable intraparenchymal hematoma and associated edema involving the left cerebellar lobe. Neurology was  following here.  Currently she is alert and oriented Continue monitoring on telemetry.  PT/OT/speech/nutrition consult done.  Recommending CIR.  Speech recommended dysphagia 3 diet.  Hypertensive emergency:.  Currently on Coreg, amlodipine,added HCTZ.  Presented with acute pulmonary edema from hypertensive crisis. Patient was also given nitro glycerin patch and Lasix.  We will continue aggressive blood pressure management.  Blood pressure still slightly elevated.  Will add lisinopril 10 mg p.o.  We will also add as needed IV hydralazine.  Elevated troponin: Severely elevated troponin.  Denies any chest pain.  EKG shows ST changes on the lateral lead.  This could be associated with supply demand ischemia from severe hypertension, pulmonary edema.  Echocardiogram done on 08/11/19 showed normal ejection fraction with grade 1 diastolic dysfunction.  Cardiology following and recommending medical management.  COPD: On home oxygen at 2 L/min.  Currently on 2-3 L of oxygen per minute.  Lungs were clear on auscultation today.  Paroxysmal A. fib: Currently rate is controlled.  On Coreg.  Not on anticoagulation due to recent subarachnoid hemorrhage.  Currently in normal sinus rhythm.  V. tach: Patient had a run of V. tach yesterday and an event of SVT today.  Will continue to monitor electrolytes and supplement as necessary.  Monitor on telemetry  UTI: Presented with urinary symptoms, abnormal UA.  Has mild leukocytosis.  Culture of urine sent on 08/12/2019 showed 2 organisms: E. coli and Streptococcus Gallolyticus.  started on Rocephin and switched to cefdinir.  Hyperlipidemia: On lipitor 80 mg daily  Hyperglycemia: Recent hemoglobin  A1c of 5.6.  Continue sliding scale insulin.      Nutrition Problem: Moderate Malnutrition Etiology: chronic illness (COPD)      DVT prophylaxis:SCD Code Status: Full Family Communication: No family at bedside. Status is: Inpatient  Remains inpatient appropriate because:Unsafe DC   Dispo: The patient is from: CIR              Anticipated d/c is to: CIR              Anticipated d/c date is:On monday              Patient currently is medically stable to d/c.  See needs monitoring for next 24 to 48 hours.  Had  episoded of VT/SVT   Consultants:Neurology,Cardiology  Procedures:None  Antimicrobials:  Anti-infectives (From admission, onward)   Start     Dose/Rate Route Frequency Ordered Stop   08/16/19 1215  cefdinir (OMNICEF) capsule 300 mg     Discontinue     300 mg Oral Every 12 hours 08/16/19 1210 08/19/19 0959   08/14/19 1200  cefTRIAXone (ROCEPHIN) 1 g in sodium chloride 0.9 % 100 mL IVPB  Status:  Discontinued        1 g 200 mL/hr over 30 Minutes Intravenous Every 24 hours 08/14/19 1107 08/16/19 1210      Subjective: Seen and examined.  She has no complaints.  Objective: Vitals:   08/17/19 0729 08/17/19 0800 08/17/19 1153 08/17/19 1235  BP: (!) 180/73  (!) 196/87 (!) 170/76  Pulse: 82  86 75  Resp: 20  (!) 24 (!) 24  Temp: 99.4 F (37.4 C)  98 F (36.7 C)   TempSrc: Axillary  Oral   SpO2: 99% 97% 94% 94%  Weight:      Height:        Intake/Output Summary (Last 24 hours) at 08/17/2019 1418 Last data filed at 08/17/2019 0900 Gross per 24 hour  Intake 300 ml  Output 1325 ml  Net -1025 ml   Filed Weights   08/14/19 1205  Weight: 62.6 kg    Examination:  General exam: Appears calm and comfortable  Respiratory system: Clear to auscultation. Respiratory effort normal. Cardiovascular system: S1 & S2 heard, RRR. No JVD,  murmurs, rubs, gallops or clicks. No pedal edema. Gastrointestinal system: Abdomen is nondistended, soft and nontender. No organomegaly or masses felt. Normal bowel sounds heard. Central nervous system: Alert and oriented. No focal neurological deficits. Extremities: Symmetric 5 x 5 power. Skin: No rashes, lesions or ulcers.   Data Reviewed: I have personally reviewed following labs and imaging studies  CBC: Recent Labs  Lab 08/12/19 0736 08/13/19 0602 08/14/19 0524 08/15/19 0820 08/15/19 1632  WBC 11.3* 7.8 21.8* 9.3 11.1*  NEUTROABS  --   --  19.3* 7.2  --   HGB 12.6 10.6* 12.8 12.2 12.5  HCT 33.7* 33.4* 40.5 38.4 38.7  MCV 86.4 89.3 90.8 90.8 90.4  PLT 272 210 261 269 275   Basic Metabolic Panel: Recent Labs  Lab 08/13/19 0602 08/14/19 0524 08/15/19 0820 08/16/19 0413 08/17/19 0406  NA 139 141 142 140 141  K 3.4* 5.1 3.5 3.5 4.3  CL 105 104 106 101 102  CO2 24 24 26 27 28   GLUCOSE 103* 137* 112* 107* 114*  BUN 12 11 11  10 12  CREATININE 0.98 1.09* 0.93 0.98 0.79  CALCIUM 8.0* 8.6* 8.2* 8.5* 8.6*  MG  --  2.0  --  2.2 1.9   GFR: Estimated Creatinine Clearance: 52.5 mL/min (by C-G formula based on SCr of 0.79 mg/dL). Liver Function Tests: Recent Labs  Lab 08/10/19 2043 08/14/19 0524  AST 23 37  ALT 11 15  ALKPHOS 84 88  BILITOT 0.6 0.9  PROT 7.2 6.8  ALBUMIN 3.3* 3.0*   No results for input(s): LIPASE, AMYLASE in the last 168 hours. No results for input(s): AMMONIA in the last 168 hours. Coagulation Profile: Recent Labs  Lab 08/10/19 2043  INR 1.0   Cardiac Enzymes: No results for input(s): CKTOTAL, CKMB, CKMBINDEX, TROPONINI in the last 168 hours. BNP (last 3 results) No results for input(s): PROBNP in the last 8760 hours. HbA1C: No results for input(s): HGBA1C in the last 72 hours. CBG: Recent Labs  Lab 08/16/19 1145 08/16/19 1620 08/16/19 2141 08/17/19 0728 08/17/19 1152  GLUCAP 126* 138* 134* 107* 129*   Lipid Profile: No results  for input(s): CHOL, HDL, LDLCALC, TRIG, CHOLHDL, LDLDIRECT in the last 72 hours. Thyroid Function Tests: No results for input(s): TSH, T4TOTAL, FREET4, T3FREE, THYROIDAB in the last 72 hours. Anemia Panel: No results for input(s): VITAMINB12, FOLATE, FERRITIN, TIBC, IRON, RETICCTPCT in the last 72 hours. Sepsis Labs: No results for input(s): PROCALCITON, LATICACIDVEN in the last 168 hours.  Recent Results (from the past 240 hour(s))  SARS Coronavirus 2 by RT PCR (hospital order, performed in [facility] hospital lab)     Status: None   Collection Time: 08/10/19  9:06 PM  Result Value Ref Range Status   SARS Coronavirus 2 NEGATIVE NEGATIVE Final    Comment: (NOTE) SARS-CoV-2 target nucleic acids are NOT DETECTED.  The SARS-CoV-2 RNA is generally detectable in upper and lower respiratory specimens during the acute phase of infection. The lowest concentration of SARS-CoV-2 viral copies this assay can detect is 250 copies / mL. A negative result does not preclude SARS-CoV-2 infection and should not be used as the sole basis for treatment or other patient management decisions.  A negative result may occur with improper specimen collection / handling, submission of specimen other than nasopharyngeal swab, presence of viral mutation(s) within the areas targeted by this assay, and inadequate number of viral copies (<250 copies / mL). A negative result must be combined with clinical observations, patient history, and epidemiological information.  Fact Sheet for Patients:   BoilerBrush.com.cy  Fact Sheet for Healthcare Providers: https://pope.com/  This test is not yet approved or  cleared by the [place] FDA and has been authorized for detection and/or diagnosis of SARS-CoV-2 by FDA under an Emergency Use Authorization (EUA).  This EUA will remain in effect (meaning this test can be used) for the duration of the COVID-19 declaration  under Section 564(b)(1) of the Act, 21 U.S.C. section 360bbb-3(b)(1), unless the authorization is terminated or revoked sooner.  Performed at [facility], [address] [place], [place] [address]   MRSA PCR Screening     Status: None   Collection Time: 08/10/19 11:05 PM   Specimen: Nasal Mucosa; Nasopharyngeal  Result Value Ref Range Status   MRSA by PCR NEGATIVE NEGATIVE Final    Comment:        The GeneXpert MRSA Assay (FDA approved for NASAL specimens only), is one component of a comprehensive MRSA colonization surveillance program. It is not intended to diagnose MRSA infection nor to  guide or monitor treatment for MRSA infections. Performed at [facility], [address] [place], [place] [address]   Culture, blood (routine x 2)     Status: None   Collection Time: 08/11/19 12:10 AM   Specimen: BLOOD LEFT HAND  Result Value Ref Range Status   Specimen Description BLOOD LEFT HAND  Final   Special Requests   Final    BOTTLES DRAWN AEROBIC AND ANAEROBIC Blood Culture adequate volume   Culture   Final    NO GROWTH 5 DAYS Performed at [facility], [address] [place], [place] [address]    Report Status 08/16/2019 FINAL  Final  Culture, blood (routine x 2)     Status: None   Collection Time: 08/11/19 12:10 AM   Specimen: BLOOD RIGHT HAND  Result Value Ref Range Status   Specimen Description BLOOD RIGHT HAND  Final   Special Requests   Final    BOTTLES DRAWN AEROBIC ONLY Blood Culture adequate volume   Culture   Final    NO GROWTH 5 DAYS Performed at [facility], [address] [place], [place] [address]    Report Status 08/16/2019 FINAL  Final  Urine Culture     Status: Abnormal   Collection Time: 08/12/19 10:35 AM   Specimen: Urine, Catheterized  Result Value Ref Range Status   Specimen Description URINE, CATHETERIZED  Final   Special Requests   Final    NONE Performed at [facility], [address], [place], [place]  [address]    Culture (A)  Final    >=100,000 COLONIES/mL ESCHERICHIA COLI 40,000 COLONIES/mL STREPTOCOCCUS GALLOLYTICUS    Report Status 08/14/2019 FINAL  Final   Organism ID, Bacteria ESCHERICHIA COLI (A)  Final   Organism ID, Bacteria STREPTOCOCCUS GALLOLYTICUS (A)  Final      Susceptibility   Escherichia coli - MIC*    AMPICILLIN 4 SENSITIVE Sensitive     CEFAZOLIN <=4 SENSITIVE Sensitive     CEFTRIAXONE <=0.25 SENSITIVE Sensitive     CIPROFLOXACIN <=0.25 SENSITIVE Sensitive     GENTAMICIN <=1 SENSITIVE Sensitive     IMIPENEM <=0.25 SENSITIVE Sensitive     NITROFURANTOIN <=16 SENSITIVE Sensitive     TRIMETH/SULFA <=20 SENSITIVE Sensitive     AMPICILLIN/SULBACTAM <=2 SENSITIVE Sensitive     PIP/TAZO <=4 SENSITIVE Sensitive     * >=100,000 COLONIES/mL ESCHERICHIA COLI   Streptococcus gallolyticus - MIC*    PENICILLIN <=0.06 SENSITIVE Sensitive     CEFTRIAXONE <=0.12 SENSITIVE Sensitive     ERYTHROMYCIN >=8 RESISTANT Resistant     LEVOFLOXACIN >=16 RESISTANT Resistant     VANCOMYCIN <=0.12 SENSITIVE Sensitive     * 40,000 COLONIES/mL STREPTOCOCCUS GALLOLYTICUS         Radiology Studies: No results found.      Scheduled Meds: . amLODipine  10 mg Oral Daily  . atorvastatin  80 mg Oral Daily  . carvedilol  12.5 mg Oral BID WC  . cefdinir  300 mg Oral Q12H  . Chlorhexidine Gluconate Cloth  6 each Topical Daily  . hydrochlorothiazide  12.5 mg Oral Daily  . insulin aspart  0-9 Units Subcutaneous TID WC  . potassium chloride  10 mEq Oral BID  . senna-docusate  1 tablet Oral BID   Continuous Infusions:    LOS: 3 days    Time spent: 30 mins.More than 50% of that time was spent in counseling and/or coordination of care.  Hughie Closs, MD  [facility] P7/11/2019, 2:18 PM

## 2019-08-17 NOTE — Consult Note (Signed)
Ref: [name], MD   Subjective:  Awake. No chest pain. Chronic back pain. VS stable. Monitor without VT.  Objective:  Vital Signs in the last 24 hours: Temp:  [98 F (36.7 C)-99.4 F (37.4 C)] 98.8 F (37.1 C) (07/10 1605) Pulse Rate:  [64-86] 64 (07/10 1605) Cardiac Rhythm: Normal sinus rhythm (07/10 0805) Resp:  [17-24] 17 (07/10 1605) BP: (104-196)/(66-87) 151/66 (07/10 1605) SpO2:  [93 %-100 %] 96 % (07/10 1605)  Physical Exam: BP Readings from Last 1 Encounters:  08/17/19 (!) 151/66     Wt Readings from Last 1 Encounters:  08/14/19 62.6 kg    Weight change:  Body mass index is 23.69 kg/m. HEENT: [place]/AT, Eyes-[place], Conjunctiva-Pink, Sclera-Non-icteric Neck: No JVD, No bruit, Trachea midline. Lungs:  Clearing, Bilateral. Cardiac:  Regular rhythm, normal S1 and S2, no S3. II/VI systolic murmur. Abdomen:  Soft, non-tender. BS present. Extremities:  No edema present. No cyanosis. No clubbing.  CNS: AxOx1, Cranial nerves grossly intact, moves all 4 extremities. Left sided weakness continues. Skin: Warm and dry.   Intake/Output from previous day: 07/09 0701 - 07/10 0700 In: 60 [P.O.:60] Out: 700 [Urine:700]    Lab Results: BMET    Component Value Date/Time   NA 141 08/17/2019 0406   NA 140 08/16/2019 0413   NA 142 08/15/2019 0820   K 4.3 08/17/2019 0406   K 3.5 08/16/2019 0413   K 3.5 08/15/2019 0820   CL 102 08/17/2019 0406   CL 101 08/16/2019 0413   CL 106 08/15/2019 0820   CO2 28 08/17/2019 0406   CO2 27 08/16/2019 0413   CO2 26 08/15/2019 0820   GLUCOSE 114 (H) 08/17/2019 0406   GLUCOSE 107 (H) 08/16/2019 0413   GLUCOSE 112 (H) 08/15/2019 0820   BUN 12 08/17/2019 0406   BUN 10 08/16/2019 0413   BUN 11 08/15/2019 0820   CREATININE 0.79 08/17/2019 0406   CREATININE 0.98 08/16/2019 0413   CREATININE 0.93 08/15/2019 0820   CALCIUM 8.6 (L) 08/17/2019 0406   CALCIUM 8.5 (L) 08/16/2019 0413   CALCIUM 8.2 (L) 08/15/2019 0820   GFRNONAA >60  08/17/2019 0406   GFRNONAA 56 (L) 08/16/2019 0413   GFRNONAA >60 08/15/2019 0820   GFRAA >60 08/17/2019 0406   GFRAA >60 08/16/2019 0413   GFRAA >60 08/15/2019 0820   CBC    Component Value Date/Time   WBC 11.1 (H) 08/15/2019 1632   RBC 4.28 08/15/2019 1632   HGB 12.5 08/15/2019 1632   HCT 38.7 08/15/2019 1632   PLT 275 08/15/2019 1632   MCV 90.4 08/15/2019 1632   MCH 29.2 08/15/2019 1632   MCHC 32.3 08/15/2019 1632   RDW 13.7 08/15/2019 1632   LYMPHSABS 1.1 08/15/2019 0820   MONOABS 0.8 08/15/2019 0820   EOSABS 0.1 08/15/2019 0820   BASOSABS 0.0 08/15/2019 0820   HEPATIC Function Panel Recent Labs    08/10/19 2043 08/14/19 0524  PROT 7.2 6.8   HEMOGLOBIN A1C No components found for: HGA1C,  MPG CARDIAC ENZYMES Lab Results  Component Value Date   TROPONINI <0.03 04/07/2016   TROPONINI <0.03 04/06/2016   TROPONINI <0.03 04/06/2016   BNP No results for input(s): PROBNP in the last 8760 hours. TSH No results for input(s): TSH in the last 8760 hours. CHOLESTEROL Recent Labs    08/10/19 2043  CHOL 260*    Scheduled Meds: . amLODipine  10 mg Oral Daily  . atorvastatin  80 mg Oral Daily  . carvedilol  12.5 mg Oral  BID WC  . cefdinir  300 mg Oral Q12H  . Chlorhexidine Gluconate Cloth  6 each Topical Daily  . hydrochlorothiazide  12.5 mg Oral Daily  . insulin aspart  0-9 Units Subcutaneous TID WC  . lisinopril  10 mg Oral Daily  . potassium chloride  10 mEq Oral BID  . senna-docusate  1 tablet Oral BID   Continuous Infusions: PRN Meds:.acetaminophen **OR** acetaminophen, albuterol, bisacodyl, hydrALAZINE, HYDROcodone-acetaminophen, labetalol, morphine injection, ondansetron **OR** ondansetron (ZOFRAN) IV, polyethylene glycol, Resource ThickenUp Clear, zolpidem  Assessment/Plan: NSTEMI Acute right PCA embolic stroke with hemorrhagic conversion Left hemiparesis HTN End Stage COPD Hyperlipidemia Mild MR Mild TR PVD Tobacco use disorder  Decrease HCTZ  and potassium dose. Increase activity. Continue monitoring.   LOS: 3 days   Time spent including chart review, lab review, examination, discussion with patient : 30 min   [name]  MD  08/17/2019, 4:10 PM

## 2019-08-17 NOTE — Plan of Care (Signed)
  Problem: Education: °Goal: Knowledge of secondary prevention will improve °Outcome: Progressing °Goal: Knowledge of patient specific risk factors addressed and post discharge goals established will improve °Outcome: Progressing °  °Problem: Activity: °Goal: Risk for activity intolerance will decrease °Outcome: Progressing °  °Problem: Nutrition: °Goal: Adequate nutrition will be maintained °Outcome: Progressing °  °Problem: Coping: °Goal: Level of anxiety will decrease °Outcome: Progressing °  °Problem: Skin Integrity: °Goal: Risk for impaired skin integrity will decrease °Outcome: Progressing °  °

## 2019-08-18 LAB — GLUCOSE, CAPILLARY
Glucose-Capillary: 110 mg/dL — ABNORMAL HIGH (ref 70–99)
Glucose-Capillary: 129 mg/dL — ABNORMAL HIGH (ref 70–99)
Glucose-Capillary: 107 mg/dL — ABNORMAL HIGH (ref 70–99)
Glucose-Capillary: 146 mg/dL — ABNORMAL HIGH (ref 70–99)
Glucose-Capillary: 135 mg/dL — ABNORMAL HIGH (ref 70–99)

## 2019-08-18 MED ORDER — AMLODIPINE BESYLATE 5 MG PO TABS
5.0000 mg | ORAL_TABLET | Freq: Every day | ORAL | Status: DC
  Administered 2019-08-19: 5 mg via ORAL
  Filled 2019-08-18: qty 1

## 2019-08-18 NOTE — Progress Notes (Signed)
Ref: [name], MD   Subjective:  Awake. No chest pain.  VS are stable. Improved BP control.  Objective:  Vital Signs in the last 24 hours: Temp:  [97.6 F (36.4 C)-98.9 F (37.2 C)] 97.6 F (36.4 C) (07/11 1152) Pulse Rate:  [59-71] 64 (07/11 1152) Cardiac Rhythm: Normal sinus rhythm (07/11 0700) Resp:  [15-20] 15 (07/11 1152) BP: (106-151)/(58-98) 141/61 (07/11 1152) SpO2:  [93 %-98 %] 93 % (07/11 1152)  Physical Exam: BP Readings from Last 1 Encounters:  08/18/19 (!) 141/61     Wt Readings from Last 1 Encounters:  08/14/19 62.6 kg    Weight change:  Body mass index is 23.69 kg/m. HEENT: [place]/AT, Eyes-Brown, Conjunctiva-Pink, Sclera-Non-icteric Neck: No JVD, No bruit, Trachea midline. Lungs:  Clearing, Bilateral. Cardiac:  Regular rhythm, normal S1 and S2, no S3. II/VI systolic murmur. Abdomen:  Soft, non-tender. BS present. Extremities:  No edema present. No cyanosis. No clubbing. CNS: AxOx1, Cranial nerves grossly intact, moves all 4 extremities. Left sided weakness. Skin: Warm and dry.   Intake/Output from previous day: 07/10 0701 - 07/11 0700 In: 480 [P.O.:480] Out: 1025 [Urine:1025]    Lab Results: BMET    Component Value Date/Time   NA 141 08/17/2019 0406   NA 140 08/16/2019 0413   NA 142 08/15/2019 0820   K 4.3 08/17/2019 0406   K 3.5 08/16/2019 0413   K 3.5 08/15/2019 0820   CL 102 08/17/2019 0406   CL 101 08/16/2019 0413   CL 106 08/15/2019 0820   CO2 28 08/17/2019 0406   CO2 27 08/16/2019 0413   CO2 26 08/15/2019 0820   GLUCOSE 114 (H) 08/17/2019 0406   GLUCOSE 107 (H) 08/16/2019 0413   GLUCOSE 112 (H) 08/15/2019 0820   BUN 12 08/17/2019 0406   BUN 10 08/16/2019 0413   BUN 11 08/15/2019 0820   CREATININE 0.79 08/17/2019 0406   CREATININE 0.98 08/16/2019 0413   CREATININE 0.93 08/15/2019 0820   CALCIUM 8.6 (L) 08/17/2019 0406   CALCIUM 8.5 (L) 08/16/2019 0413   CALCIUM 8.2 (L) 08/15/2019 0820   GFRNONAA >60 08/17/2019 0406    GFRNONAA 56 (L) 08/16/2019 0413   GFRNONAA >60 08/15/2019 0820   GFRAA >60 08/17/2019 0406   GFRAA >60 08/16/2019 0413   GFRAA >60 08/15/2019 0820   CBC    Component Value Date/Time   WBC 11.1 (H) 08/15/2019 1632   RBC 4.28 08/15/2019 1632   HGB 12.5 08/15/2019 1632   HCT 38.7 08/15/2019 1632   PLT 275 08/15/2019 1632   MCV 90.4 08/15/2019 1632   MCH 29.2 08/15/2019 1632   MCHC 32.3 08/15/2019 1632   RDW 13.7 08/15/2019 1632   LYMPHSABS 1.1 08/15/2019 0820   MONOABS 0.8 08/15/2019 0820   EOSABS 0.1 08/15/2019 0820   BASOSABS 0.0 08/15/2019 0820   HEPATIC Function Panel Recent Labs    08/10/19 2043 08/14/19 0524  PROT 7.2 6.8   HEMOGLOBIN A1C No components found for: HGA1C,  MPG CARDIAC ENZYMES Lab Results  Component Value Date   TROPONINI <0.03 04/07/2016   TROPONINI <0.03 04/06/2016   TROPONINI <0.03 04/06/2016   BNP No results for input(s): PROBNP in the last 8760 hours. TSH No results for input(s): TSH in the last 8760 hours. CHOLESTEROL Recent Labs    08/10/19 2043  CHOL 260*    Scheduled Meds: . [START ON 08/19/2019] amLODipine  5 mg Oral Daily  . atorvastatin  80 mg Oral Daily  . carvedilol  12.5 mg Oral  BID WC  . cefdinir  300 mg Oral Q12H  . Chlorhexidine Gluconate Cloth  6 each Topical Daily  . hydrochlorothiazide  12.5 mg Oral Daily  . insulin aspart  0-9 Units Subcutaneous TID WC  . lisinopril  10 mg Oral Daily  . potassium chloride  10 mEq Oral BID  . senna-docusate  1 tablet Oral BID   Continuous Infusions: PRN Meds:.acetaminophen **OR** acetaminophen, albuterol, bisacodyl, hydrALAZINE, HYDROcodone-acetaminophen, labetalol, morphine injection, ondansetron **OR** ondansetron (ZOFRAN) IV, polyethylene glycol, Resource ThickenUp Clear, zolpidem  Assessment/Plan: NSTEMI Acute right PCA embolic stroke with hemorrhagic conversion Left hemiparesis Hypertension End stage COPD Hyperlipidemia Mild MR Mild TR PVD Tobacco use  disorder  Plan: Continue medical treatment. Awaiting Rehab. Increase activity as tolerated.   LOS: 4 days   Time spent including chart review, lab review, examination, discussion with patient and nurse : 30 min   [name]  MD  08/18/2019, 5:50 PM

## 2019-08-18 NOTE — Progress Notes (Signed)
PROGRESS NOTE    Caitlin Vasquez  HWE:[id number] DOB: December 05, 1945 DOA: 08/14/2019 PCP: [name], MD   Brief Narrative:  Patient is a 74 year old female with history of COPD on home oxygen, A. 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 fib, not on anticoagulation presented and readmitted for respiratory distress in CIR.  On her previous hospitalization, she had hypertensive emergency and was treated with Cleviprex and started on Coreg and Norvasc.She was just discharged to [facility] by neurology a day before admission here.  She developed acute respiratory distress in CIR and desaturated to 75% on 3 L oxygen nasal cannula.  BP was high.  Chest x-ray was concerning for pulmonary edema.  She was admitted to medical floor under hospitalist service.  She received Lasix and Nitropaste.  Respiratory status has improved.  She was found to have severely elevated troponin.  Denied any chest pain.  EKG showed ST changes on the lateral lead so cardiology was consulted and echocardiogram done on 08/11/19 showed normal ejection fraction with grade 1 diastolic dysfunction.  Cardiology recommended medical management and opined that her elevated troponin was likely due to demand ischemia.  Cardiology and neurology were consulted.  Cardiology added HCTZ to her regimen.  She was also diagnosed with UTI. Culture of urine sent on 08/12/2019 showed 2 organisms: E. coli and Streptococcus Gallolyticus. started on Rocephin initially and switched to cefdinir.  Now the plan is to discharge her back to CIR on Monday.  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  Respiratory status improved after given Lasix, Nitropaste.  Currently she is on baseline oxygen requirement. Added HCTZ by cardiology  CVA/ICH: Patient was recently hospitalized with acute CVA with hemorrhagic transformation and was discharged to CIR.  Not on aspirin or any kind of anticoagulation.  CT head shows interval evolution of large right PCA territory infarct involving the parasagittal temporal lobe, occipital lobe, and right basal ganglia,tinny focus of probable petechial hemorrhage  evident within the right globus pallidus. Mild mass effect without midline shift. Stable intraparenchymal hematoma and associated edema involving the left cerebellar lobe. Neurology was  following here.  Currently she is alert and oriented Continue monitoring on telemetry.  PT/OT/speech/nutrition consult done.  Recommending CIR.  Speech recommended dysphagia 3 diet.  Hypertensive emergency:.  Currently on Coreg, amlodipine and added HCTZ by cardiology.  Presented with acute pulmonary edema from hypertensive crisis. Patient was also given nitro glycerin patch and Lasix.  We will continue aggressive blood pressure management.  Due to elevated blood pressure, I added lisinopril yesterday and her blood pressure is much better now.  Continue current regimen.  Elevated troponin: Severely elevated troponin.  Denies any chest pain.  EKG shows ST changes on the lateral lead.  This could be associated with supply demand ischemia from severe hypertension, pulmonary edema.  Echocardiogram done  on 08/11/19 showed normal ejection fraction with grade 1 diastolic dysfunction.  Cardiology  recommending medical management.  COPD: On home oxygen at 2 L/min.  Currently on 2-3 L of oxygen per minute.  Lungs were clear on auscultation today.  Paroxysmal A. fib: Currently rate is controlled.  On Coreg.  Not on anticoagulation due to recent subarachnoid hemorrhage.  Currently in normal sinus rhythm.  V. tach: Patient had a run of V. tach yesterday and an event of SVT today.  Will continue to monitor electrolytes and supplement as necessary.  Monitor on telemetry  UTI: Presented with urinary symptoms, abnormal UA.  Has mild leukocytosis.  Culture of urine sent on 08/12/2019 showed 2 organisms: E. coli and Streptococcus Gallolyticus. started on Rocephin and switched to cefdinir.  Hyperlipidemia: On lipitor 80 mg daily  Hyperglycemia: Recent hemoglobin  A1c of 5.6.  Continue sliding scale insulin.  Acute urinary retention: Patient has been having urinary retention requiring in and out cath.   Nutrition Problem: Moderate Malnutrition Etiology: chronic illness (COPD)      DVT prophylaxis:SCD Code Status: Full Family Communication: No family at bedside. Status is: Inpatient  Remains inpatient appropriate because:Unsafe DC   Dispo: The patient is from: CIR              Anticipated d/c is to: CIR              Anticipated d/c date is:On monday              Patient currently is medically stable to d/c.  See needs monitoring for next 24 to 48 hours.  Had  episoded of VT/SVT   Consultants:Neurology,Cardiology  Procedures:None  Antimicrobials:  Anti-infectives (From admission, onward)   Start     Dose/Rate Route Frequency Ordered Stop   08/16/19 1215  cefdinir (OMNICEF) capsule 300 mg     Discontinue     300 mg Oral Every 12 hours 08/16/19 1210 08/19/19 0959   08/14/19 1200  cefTRIAXone (ROCEPHIN) 1 g in sodium chloride 0.9 % 100 mL IVPB  Status:  Discontinued        1 g 200 mL/hr over 30 Minutes Intravenous Every 24 hours 08/14/19 1107 08/16/19 1210       Subjective: Patient seen and examined.  She has no complaints.  Objective: Vitals:   08/18/19 0638 08/18/19 0800 08/18/19 0809 08/18/19 1152  BP: 129/72 (!) 143/86 106/66 (!) 141/61  Pulse: 63 64 (!) 59 64  Resp: 19 19 15 15   Temp:  97.7 F (36.5 C) 98.7 F (37.1 C) 97.6 F (36.4 C)  TempSrc:  Axillary Axillary Oral  SpO2: 95% 96%  93%  Weight:      Height:        Intake/Output Summary (Last 24 hours) at 08/18/2019 1311 Last data filed at 08/18/2019 1259 Gross per 24 hour  Intake 140 ml  Output 900 ml  Net -760 ml   Filed Weights   08/14/19 1205  Weight: 62.6 kg    Examination:  General exam: Appears calm and comfortable  Respiratory system: Clear to auscultation. Respiratory effort normal. Cardiovascular system: S1 & S2 heard, RRR. No JVD, murmurs, rubs, gallops or clicks. No pedal edema. Gastrointestinal system: Abdomen is nondistended, soft and nontender. No organomegaly or masses felt. Normal bowel sounds heard. Central nervous system: Alert and oriented.  Left hemiparesis Extremities: Symmetric 5 x 5 power. Skin: No rashes, lesions or ulcers.   Data Reviewed: I have personally reviewed following labs and imaging studies  CBC: Recent Labs  Lab 08/12/19 0736 08/13/19 0602 08/14/19 0524 08/15/19 0820 08/15/19 1632  WBC 11.3* 7.8 21.8* 9.3 11.1*  NEUTROABS  --   --  19.3* 7.2  --   HGB 12.6 10.6* 12.8 12.2 12.5  HCT 33.7* 33.4* 40.5 38.4 38.7  MCV 86.4 89.3 90.8 90.8 90.4  PLT 272 210 261 269 275   Basic Metabolic Panel: Recent Labs  Lab 08/13/19 0602 08/14/19 0524 08/15/19 0820 08/16/19 0413 08/17/19 0406  NA 139 141 142 140 141  K 3.4* 5.1 3.5 3.5 4.3  CL 105 104 106 101 102  CO2 24 24 26 27 28   GLUCOSE 103* 137* 112* 107* 114*  BUN 12 11 11 10 12   CREATININE 0.98 1.09* 0.93 0.98 0.79  CALCIUM 8.0* 8.6* 8.2* 8.5* 8.6*  MG  --  2.0  --  2.2 1.9   GFR: Estimated Creatinine Clearance: 52.5 mL/min (by C-G formula based on SCr of 0.79  mg/dL). Liver Function Tests: Recent Labs  Lab 08/14/19 0524  AST 37  ALT 15  ALKPHOS 88  BILITOT 0.9  PROT 6.8  ALBUMIN 3.0*   No results for input(s): LIPASE, AMYLASE in the last 168 hours. No results for input(s): AMMONIA in the last 168 hours. Coagulation Profile: No results for input(s): INR, PROTIME in the last 168 hours. Cardiac Enzymes: No results for input(s): CKTOTAL, CKMB, CKMBINDEX, TROPONINI in the last 168 hours. BNP (last 3 results) No results for input(s): PROBNP in the last 8760 hours. HbA1C: No results for input(s): HGBA1C in the last 72 hours. CBG: Recent Labs  Lab 08/17/19 1604 08/17/19 2121 08/18/19 0636 08/18/19 0820 08/18/19 1151  GLUCAP 146* 141* 110* 129* 107*   Lipid Profile: No results for input(s): CHOL, HDL, LDLCALC, TRIG, CHOLHDL, LDLDIRECT in the last 72 hours. Thyroid Function Tests: No results for input(s): TSH, T4TOTAL, FREET4, T3FREE, THYROIDAB in the last 72 hours. Anemia Panel: No results for input(s): VITAMINB12, FOLATE, FERRITIN, TIBC, IRON, RETICCTPCT in the last 72 hours. Sepsis Labs: No results for input(s): PROCALCITON, LATICACIDVEN in the last 168 hours.  Recent Results (from the past 240 hour(s))  SARS Coronavirus 2 by RT PCR (hospital order, performed in [facility] hospital lab)     Status: None   Collection Time: 08/10/19  9:06 PM  Result Value Ref Range Status   SARS Coronavirus 2 NEGATIVE NEGATIVE Final    Comment: (NOTE) SARS-CoV-2 target nucleic acids are NOT DETECTED.  The SARS-CoV-2 RNA is generally detectable in upper and lower respiratory specimens during the acute phase of infection. The lowest concentration of SARS-CoV-2 viral copies this assay can detect is 250 copies / mL. A negative result does not preclude SARS-CoV-2 infection and should not be used as the sole basis for treatment or other patient management decisions.  A negative result may occur with improper specimen collection / handling,  submission of specimen other than nasopharyngeal swab, presence of viral mutation(s) within the areas targeted by this assay, and inadequate number of viral copies (<250 copies / mL). A negative result must be combined with clinical observations, patient history, and epidemiological information.  Fact Sheet for Patients:   [facility]  Fact Sheet for Healthcare Providers: 10/11/19  This test is not yet approved or  cleared by the [employer] FDA and has been authorized for detection and/or diagnosis of SARS-CoV-2 by FDA under an Emergency Use Authorization (EUA).  This EUA will remain in effect (meaning this test can be used) for the duration of  the COVID-19 declaration under Section 564(b)(1) of the Act, 21 U.S.C. section 360bbb-3(b)(1), unless the authorization is terminated or revoked sooner.  Performed at [facility], [address], [place], [place] [address]   MRSA PCR Screening     Status: None   Collection Time: 08/10/19 11:05 PM   Specimen: Nasal Mucosa; Nasopharyngeal  Result Value Ref Range Status   MRSA by PCR NEGATIVE NEGATIVE Final    Comment:        The GeneXpert MRSA Assay (FDA approved for NASAL specimens only), is one component of a comprehensive MRSA colonization surveillance program. It is not intended to diagnose MRSA infection nor to guide or monitor treatment for MRSA infections. Performed at [facility], [address], [place], [place] [address]   Culture, blood (routine x 2)     Status: None   Collection Time: 08/11/19 12:10 AM   Specimen: BLOOD LEFT HAND  Result Value Ref Range Status   Specimen Description BLOOD LEFT HAND  Final   Special Requests   Final    BOTTLES DRAWN AEROBIC AND ANAEROBIC Blood Culture adequate volume   Culture   Final    NO GROWTH 5 DAYS Performed at [facility], [address] [place], [place] [address]    Report Status  08/16/2019 FINAL  Final  Culture, blood (routine x 2)     Status: None   Collection Time: 08/11/19 12:10 AM   Specimen: BLOOD RIGHT HAND  Result Value Ref Range Status   Specimen Description BLOOD RIGHT HAND  Final   Special Requests   Final    BOTTLES DRAWN AEROBIC ONLY Blood Culture adequate volume   Culture   Final    NO GROWTH 5 DAYS Performed at [facility], [address], [place], [place] [address]    Report Status 08/16/2019 FINAL  Final  Urine Culture     Status: Abnormal   Collection Time: 08/12/19 10:35 AM   Specimen: Urine, Catheterized  Result Value Ref Range Status   Specimen Description URINE, CATHETERIZED  Final   Special Requests   Final    NONE Performed at [facility], [address], [place], [place] [address]    Culture (A)  Final    >=100,000 COLONIES/mL ESCHERICHIA COLI 40,000 COLONIES/mL STREPTOCOCCUS GALLOLYTICUS    Report Status 08/14/2019 FINAL  Final   Organism ID, Bacteria ESCHERICHIA COLI (A)  Final   Organism ID, Bacteria STREPTOCOCCUS GALLOLYTICUS (A)  Final      Susceptibility   Escherichia coli - MIC*    AMPICILLIN 4 SENSITIVE Sensitive     CEFAZOLIN <=4 SENSITIVE Sensitive     CEFTRIAXONE <=0.25 SENSITIVE Sensitive     CIPROFLOXACIN <=0.25 SENSITIVE Sensitive     GENTAMICIN <=1 SENSITIVE Sensitive     IMIPENEM <=0.25 SENSITIVE Sensitive     NITROFURANTOIN <=16 SENSITIVE Sensitive     TRIMETH/SULFA <=20 SENSITIVE Sensitive     AMPICILLIN/SULBACTAM <=2 SENSITIVE Sensitive     PIP/TAZO <=4 SENSITIVE Sensitive     * >=100,000 COLONIES/mL ESCHERICHIA COLI   Streptococcus gallolyticus - MIC*    PENICILLIN <=0.06 SENSITIVE Sensitive     CEFTRIAXONE <=0.12 SENSITIVE Sensitive     ERYTHROMYCIN >=8 RESISTANT Resistant     LEVOFLOXACIN >=16 RESISTANT Resistant     VANCOMYCIN <=0.12 SENSITIVE Sensitive     * 40,000 COLONIES/mL STREPTOCOCCUS GALLOLYTICUS         Radiology Studies: No results found.  Scheduled  Meds: . [START ON 08/19/2019] amLODipine  5 mg Oral Daily  . atorvastatin  80 mg Oral Daily  . carvedilol  12.5 mg Oral BID WC  . cefdinir  300 mg Oral Q12H  . Chlorhexidine Gluconate Cloth  6 each Topical Daily  . hydrochlorothiazide  12.5 mg Oral Daily  . insulin aspart  0-9 Units Subcutaneous TID WC  . lisinopril  10 mg Oral Daily  . potassium chloride  10 mEq Oral BID  . senna-docusate  1 tablet Oral BID   Continuous Infusions:    LOS: 4 days    Time spent: 27 minutes  Hughie Closs, MD [facility] P7/12/2019, 1:11 PM

## 2019-08-19 ENCOUNTER — Encounter (HOSPITAL_COMMUNITY): Payer: Self-pay | Admitting: Physical Medicine & Rehabilitation

## 2019-08-19 ENCOUNTER — Inpatient Hospital Stay (HOSPITAL_COMMUNITY)
Admission: RE | Admit: 2019-08-19 | Discharge: 2019-09-12 | DRG: 056 | Disposition: A | Discharge status: Home Health | Payer: Medicare Other | Source: Intra-hospital | Attending: Physical Medicine & Rehabilitation | Admitting: Physical Medicine & Rehabilitation

## 2019-08-19 ENCOUNTER — Other Ambulatory Visit: Payer: Self-pay

## 2019-08-19 ENCOUNTER — Inpatient Hospital Stay (HOSPITAL_COMMUNITY): Payer: Medicare Other

## 2019-08-19 DIAGNOSIS — R0602 Shortness of breath: Secondary | ICD-10-CM

## 2019-08-19 DIAGNOSIS — Z95828 Presence of other vascular implants and grafts: Secondary | ICD-10-CM | POA: Diagnosis not present

## 2019-08-19 DIAGNOSIS — Z87891 Personal history of nicotine dependence: Secondary | ICD-10-CM

## 2019-08-19 DIAGNOSIS — I214 Non-ST elevation (NSTEMI) myocardial infarction: Secondary | ICD-10-CM | POA: Diagnosis present

## 2019-08-19 DIAGNOSIS — R339 Retention of urine, unspecified: Secondary | ICD-10-CM

## 2019-08-19 DIAGNOSIS — R0989 Other specified symptoms and signs involving the circulatory and respiratory systems: Secondary | ICD-10-CM | POA: Diagnosis not present

## 2019-08-19 DIAGNOSIS — R3981 Functional urinary incontinence: Secondary | ICD-10-CM | POA: Diagnosis present

## 2019-08-19 DIAGNOSIS — R0682 Tachypnea, not elsewhere classified: Secondary | ICD-10-CM

## 2019-08-19 DIAGNOSIS — R1312 Dysphagia, oropharyngeal phase: Secondary | ICD-10-CM | POA: Diagnosis present

## 2019-08-19 DIAGNOSIS — Z96642 Presence of left artificial hip joint: Secondary | ICD-10-CM | POA: Diagnosis present

## 2019-08-19 DIAGNOSIS — R7309 Other abnormal glucose: Secondary | ICD-10-CM | POA: Diagnosis not present

## 2019-08-19 DIAGNOSIS — R32 Unspecified urinary incontinence: Secondary | ICD-10-CM

## 2019-08-19 DIAGNOSIS — J449 Chronic obstructive pulmonary disease, unspecified: Secondary | ICD-10-CM | POA: Diagnosis present

## 2019-08-19 DIAGNOSIS — I4891 Unspecified atrial fibrillation: Secondary | ICD-10-CM | POA: Diagnosis present

## 2019-08-19 DIAGNOSIS — I614 Nontraumatic intracerebral hemorrhage in cerebellum: Secondary | ICD-10-CM | POA: Diagnosis not present

## 2019-08-19 DIAGNOSIS — R062 Wheezing: Secondary | ICD-10-CM

## 2019-08-19 DIAGNOSIS — F419 Anxiety disorder, unspecified: Secondary | ICD-10-CM | POA: Diagnosis present

## 2019-08-19 DIAGNOSIS — R799 Abnormal finding of blood chemistry, unspecified: Secondary | ICD-10-CM | POA: Diagnosis not present

## 2019-08-19 DIAGNOSIS — I69391 Dysphagia following cerebral infarction: Secondary | ICD-10-CM | POA: Diagnosis not present

## 2019-08-19 DIAGNOSIS — I69118 Other symptoms and signs involving cognitive functions following nontraumatic intracerebral hemorrhage: Secondary | ICD-10-CM

## 2019-08-19 DIAGNOSIS — I10 Essential (primary) hypertension: Secondary | ICD-10-CM | POA: Diagnosis present

## 2019-08-19 DIAGNOSIS — I69154 Hemiplegia and hemiparesis following nontraumatic intracerebral hemorrhage affecting left non-dominant side: Principal | ICD-10-CM

## 2019-08-19 DIAGNOSIS — R0600 Dyspnea, unspecified: Secondary | ICD-10-CM

## 2019-08-19 DIAGNOSIS — J810 Acute pulmonary edema: Secondary | ICD-10-CM | POA: Diagnosis present

## 2019-08-19 DIAGNOSIS — R944 Abnormal results of kidney function studies: Secondary | ICD-10-CM | POA: Diagnosis present

## 2019-08-19 DIAGNOSIS — Z8249 Family history of ischemic heart disease and other diseases of the circulatory system: Secondary | ICD-10-CM | POA: Diagnosis not present

## 2019-08-19 DIAGNOSIS — I63331 Cerebral infarction due to thrombosis of right posterior cerebral artery: Secondary | ICD-10-CM | POA: Diagnosis not present

## 2019-08-19 DIAGNOSIS — I69190 Apraxia following nontraumatic intracerebral hemorrhage: Secondary | ICD-10-CM | POA: Diagnosis not present

## 2019-08-19 DIAGNOSIS — R159 Full incontinence of feces: Secondary | ICD-10-CM

## 2019-08-19 DIAGNOSIS — I6310 Cerebral infarction due to embolism of unspecified precerebral artery: Secondary | ICD-10-CM

## 2019-08-19 LAB — GLUCOSE, CAPILLARY
Glucose-Capillary: 110 mg/dL — ABNORMAL HIGH (ref 70–99)
Glucose-Capillary: 126 mg/dL — ABNORMAL HIGH (ref 70–99)
Glucose-Capillary: 119 mg/dL — ABNORMAL HIGH (ref 70–99)
Glucose-Capillary: 112 mg/dL — ABNORMAL HIGH (ref 70–99)
Glucose-Capillary: 134 mg/dL — ABNORMAL HIGH (ref 70–99)

## 2019-08-19 MED ORDER — POLYETHYLENE GLYCOL 3350 17 G PO PACK: 17.0000 g | PACK | Freq: Every day | ORAL | Status: DC | PRN

## 2019-08-19 MED ORDER — ALUM & MAG HYDROXIDE-SIMETH 200-200-20 MG/5ML PO SUSP
30.0000 mL | ORAL | Status: DC | PRN
  Administered 2019-08-31 – 2019-09-02 (×2): 30 mL via ORAL
  Filled 2019-08-19 (×2): qty 30

## 2019-08-19 MED ORDER — SENNOSIDES-DOCUSATE SODIUM 8.6-50 MG PO TABS
1.0000 | ORAL_TABLET | Freq: Two times a day (BID) | ORAL | Status: DC
  Administered 2019-08-19 – 2019-08-20 (×2): 1 via ORAL
  Filled 2019-08-19 (×2): qty 1

## 2019-08-19 MED ORDER — CARVEDILOL 12.5 MG PO TABS
12.5000 mg | ORAL_TABLET | Freq: Two times a day (BID) | ORAL | Status: DC
  Administered 2019-08-19 – 2019-09-12 (×48): 12.5 mg via ORAL
  Filled 2019-08-19 (×48): qty 1

## 2019-08-19 MED ORDER — PROCHLORPERAZINE 25 MG RE SUPP: 12.5000 mg | Freq: Four times a day (QID) | RECTAL | Status: DC | PRN

## 2019-08-19 MED ORDER — RESOURCE THICKENUP CLEAR PO POWD
1.0000 | ORAL | Status: DC | PRN
  Filled 2019-08-19 (×2): qty 125

## 2019-08-19 MED ORDER — CHLORHEXIDINE GLUCONATE CLOTH 2 % EX PADS: 6.0000 | MEDICATED_PAD | Freq: Every day | CUTANEOUS | Status: DC

## 2019-08-19 MED ORDER — ACETAMINOPHEN 325 MG PO TABS
325.0000 mg | ORAL_TABLET | ORAL | Status: DC | PRN
  Administered 2019-08-20 – 2019-09-01 (×12): 650 mg via ORAL
  Administered 2019-09-02: 325 mg via ORAL
  Administered 2019-09-04 – 2019-09-11 (×2): 650 mg via ORAL
  Filled 2019-08-19 (×14): qty 2

## 2019-08-19 MED ORDER — ALBUTEROL SULFATE (2.5 MG/3ML) 0.083% IN NEBU
3.0000 mL | INHALATION_SOLUTION | Freq: Four times a day (QID) | RESPIRATORY_TRACT | Status: DC | PRN
  Administered 2019-08-26 – 2019-08-30 (×3): 3 mL via RESPIRATORY_TRACT
  Filled 2019-08-19 (×3): qty 3

## 2019-08-19 MED ORDER — ATORVASTATIN CALCIUM 80 MG PO TABS
80.0000 mg | ORAL_TABLET | Freq: Every day | ORAL | Status: DC
  Administered 2019-08-20 – 2019-09-12 (×24): 80 mg via ORAL
  Filled 2019-08-19 (×24): qty 1

## 2019-08-19 MED ORDER — MELATONIN 3 MG PO TABS
3.0000 mg | ORAL_TABLET | Freq: Every evening | ORAL | Status: DC | PRN
  Administered 2019-08-20 – 2019-09-11 (×5): 3 mg via ORAL
  Filled 2019-08-19 (×7): qty 1

## 2019-08-19 MED ORDER — POTASSIUM CHLORIDE CRYS ER 10 MEQ PO TBCR
10.0000 meq | EXTENDED_RELEASE_TABLET | Freq: Two times a day (BID) | ORAL | Status: DC
  Administered 2019-08-19: 10 meq via ORAL
  Filled 2019-08-19 (×2): qty 1

## 2019-08-19 MED ORDER — PROCHLORPERAZINE EDISYLATE 10 MG/2ML IJ SOLN: 5.0000 mg | Freq: Four times a day (QID) | INTRAMUSCULAR | Status: DC | PRN

## 2019-08-19 MED ORDER — HYDROCHLOROTHIAZIDE 12.5 MG PO CAPS: 12.5000 mg | ORAL_CAPSULE | Freq: Every day | ORAL | 0 refills | Status: DC

## 2019-08-19 MED ORDER — HYDROCHLOROTHIAZIDE 12.5 MG PO CAPS
12.5000 mg | ORAL_CAPSULE | Freq: Every day | ORAL | Status: DC
  Administered 2019-08-20 – 2019-09-09 (×21): 12.5 mg via ORAL
  Filled 2019-08-19 (×21): qty 1

## 2019-08-19 MED ORDER — BISACODYL 10 MG RE SUPP: 10.0000 mg | Freq: Every day | RECTAL | Status: DC | PRN

## 2019-08-19 MED ORDER — GUAIFENESIN-DM 100-10 MG/5ML PO SYRP
5.0000 mL | ORAL_SOLUTION | Freq: Four times a day (QID) | ORAL | Status: DC | PRN
  Administered 2019-08-24 – 2019-09-11 (×8): 10 mL via ORAL
  Filled 2019-08-19 (×8): qty 10

## 2019-08-19 MED ORDER — FLEET ENEMA 7-19 GM/118ML RE ENEM: 1.0000 | ENEMA | Freq: Once | RECTAL | Status: DC | PRN

## 2019-08-19 MED ORDER — LISINOPRIL 10 MG PO TABS
10.0000 mg | ORAL_TABLET | Freq: Every day | ORAL | Status: DC
  Administered 2019-08-20 – 2019-09-12 (×24): 10 mg via ORAL
  Filled 2019-08-19 (×24): qty 1

## 2019-08-19 MED ORDER — LISINOPRIL 10 MG PO TABS: 10.0000 mg | ORAL_TABLET | Freq: Every day | ORAL | 0 refills | Status: DC

## 2019-08-19 MED ORDER — DIPHENHYDRAMINE HCL 12.5 MG/5ML PO ELIX: 12.5000 mg | ORAL_SOLUTION | Freq: Four times a day (QID) | ORAL | Status: DC | PRN

## 2019-08-19 MED ORDER — PROCHLORPERAZINE MALEATE 5 MG PO TABS
5.0000 mg | ORAL_TABLET | Freq: Four times a day (QID) | ORAL | Status: DC | PRN
  Administered 2019-08-25: 5 mg via ORAL
  Administered 2019-08-26: 10 mg via ORAL
  Administered 2019-08-27 – 2019-09-02 (×5): 5 mg via ORAL
  Administered 2019-09-03: 10 mg via ORAL
  Filled 2019-08-19: qty 1
  Filled 2019-08-19 (×2): qty 2
  Filled 2019-08-19 (×5): qty 1

## 2019-08-19 MED ORDER — AMLODIPINE BESYLATE 5 MG PO TABS
5.0000 mg | ORAL_TABLET | Freq: Every day | ORAL | Status: DC
  Administered 2019-08-20 – 2019-09-12 (×24): 5 mg via ORAL
  Filled 2019-08-19 (×24): qty 1

## 2019-08-19 NOTE — Progress Notes (Signed)
Izora Ribas, MD  Physician  Physical Medicine and Rehabilitation  PMR Pre-admission      Signed  Date of Service:  08/19/2019 10:41 AM      Related encounter: Admission (Discharged) from 08/14/2019 in [place] [facility]      Signed       PMR Admission Coordinator Pre-Admission Assessment   Patient: Caitlin Vasquez is an 74 y.o., female MRN: [id number] DOB: December 02, 1945 Height: 5' 4"  (162.6 cm) Weight: 62.6 kg   Insurance Information HMO:     PPO:      PCP:      IPA:      80/20: yes     OTHER:  PRIMARY: Medicare A and B      Policy#:[id number]       Subscriber: patient CM Name:       Phone#:      Fax#:  Pre-Cert#:       Employer:  Benefits:  Phone #: online     Name: verified eligibility online via [employer] on 08/19/19 Eff. Date: Part A and B effective 01/07/2009     Deduct: $1,484      Out of Pocket Max: NA      Life Max: NA CIR: Covered per Medicare guidelines once yearly deductible has been met      SNF: days 1-20, 100%, days 21-100, 80% Outpatient: 80%     Co-Pay: 20% Home Health: 100%      Co-Pay:  DME: 80%     Co-Pay: 20% Providers: Pt's choice SECONDARY: Generic Commercial       Policy#: [id number]     Phone#: [phone]   Financial Counselor:       Phone#:    The "Data Collection Information Summary" for patients in Inpatient Rehabilitation Facilities with attached "Privacy Act [place] Records" was provided and verbally reviewed with: Family   Emergency Contact Information         Contact Information     Name Relation Home Work Mobile    Dragos,Justin Son [phone]   [phone]    Larenda, Reedy     [phone]         Current Medical History  Patient Admitting Diagnosis: Cerebellar hemorrhage complicated by respiratory failure   History of Present Illness:   Caitlin Vasquez is a 74 y.o. female with history of CAF-no AC, COPD--has been off oxygen for years,  PAD who was admitted via [facility] on 08/10/19  with HA, left inattention and difficulty walking due to  acute left cerebellar hemorrhage with intraventricular extension. 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 MRI brain showed no change in small IPH left cerebellum with surrounding mass effect, small amount SAH and moderate ischemic infarct R-PCA with moderately advanced cerebral atrophy. Dr. Joaquim Nam consulted for input and felt that no surgical intervention needed and recommended MRI w/wo contrast when able to rule out underlying mass. She was started on Cleveprex for BP control and 2 D echo done for work up.  She has had urinary retention requiring I/O caths. Therapy evaluations completed revealing deficits in attention and memory, significant left inattention with ataxia affecting functional status. CIR was recommended due to functional deficits. 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 Pt was admitted to CIR initially on 7/6 but unfortunately required readmit  back to acute on 7/7 due to acute respiratory distress/acute on chronic hypoxic respiratory failure and hypertensive emergency. Her respiratory status has improved after Lasix, nitropaste and she is currently on 3L Mark. Since return to acute a head CT showed interval evolution of large right PCA territory infarct. Cardiology consulted due to elevated HS-troponin 1; recommendations were to add HCTZ and potassium for BP and pulmonary edema. Pt was not deemed to be a candidate for cardiac interventions at this time. On 7/8 pt showed 20 beat V-tach with recommendation for IV magnesium was given. VS stable and BP improved. Therapy re-evaluated since return to acute with recommendations for CIR. Pt is to admit to CIR on 7/12.    Complete NIHSS TOTAL: 13   Patient's  medical record from [facility] has been reviewed by the rehabilitation admission coordinator and physician.   Past Medical History      Past Medical History:  Diagnosis Date  . COPD (chronic obstructive pulmonary disease) ([place])    . High cholesterol    . On home oxygen therapy      "2L; all the time" (04/06/2016)  . Pneumonia      "when I was a kid"   . Urinary hesitancy        Family History   family history includes Heart attack in her father.   Prior Rehab/Hospitalizations Has the patient had prior rehab or hospitalizations prior to admission? Yes   Has the patient had major surgery during 100 days prior to admission? No               Current Medications   Current Facility-Administered Medications:  .  acetaminophen (TYLENOL) tablet 650 mg, 650 mg, Oral, Q6H PRN, 650 mg at 08/17/19 2132 **OR** acetaminophen (TYLENOL) suppository 650 mg, 650 mg, Rectal, Q6H PRN, Karmen Bongo, MD .  albuterol (PROVENTIL) (2.5 MG/3ML) 0.083% nebulizer solution 3 mL, 3 mL, Inhalation, Q6H PRN, Karmen Bongo, MD .  amLODipine (NORVASC) tablet 5 mg, 5 mg, Oral, Daily, Dixie Dials, MD, 5 mg at 08/19/19 0959 .  atorvastatin (LIPITOR) tablet 80 mg, 80 mg, Oral, Daily, Karmen Bongo, MD, 80 mg at 08/19/19 1002 .  bisacodyl (DULCOLAX) EC tablet 5 mg, 5 mg, Oral, Daily PRN, Karmen Bongo, MD .  carvedilol (COREG) tablet 12.5 mg, 12.5 mg, Oral, BID WC, Karmen Bongo, MD, 12.5 mg at 08/19/19 0615 .  Chlorhexidine Gluconate Cloth 2 % PADS 6 each, 6 each, Topical, Daily, Karmen Bongo, MD, 6 each at 08/19/19 0959 .  hydrALAZINE (APRESOLINE) injection 10 mg, 10 mg, Intravenous, Q6H PRN, Pahwani, Ravi, MD .  hydrochlorothiazide (MICROZIDE) capsule 12.5 mg, 12.5 mg, Oral, Daily, Dixie Dials, MD, 12.5 mg at 08/19/19 0958 .  HYDROcodone-acetaminophen (NORCO/VICODIN) 5-325 MG per tablet 1-2 tablet, 1-2 tablet, Oral, Q4H PRN, Karmen Bongo, MD, 2 tablet at 08/18/19 1941 .  insulin  aspart (novoLOG) injection 0-9 Units, 0-9 Units, Subcutaneous, TID WC, Karmen Bongo, MD, 1 Units at 08/18/19 1647 .  labetalol (NORMODYNE) injection 10 mg, 10 mg, Intravenous, Q2H PRN, Shelly Coss, MD, 10 mg at 08/17/19 1205 .  lisinopril (ZESTRIL) tablet 10 mg, 10 mg, Oral, Daily, Pahwani, Ravi, MD, 10 mg at 08/19/19 0958 .  morphine 2 MG/ML injection 2 mg, 2 mg, Intravenous, Q2H PRN, Karmen Bongo, MD, 2 mg at 08/14/19 1512 .  ondansetron (ZOFRAN) tablet 4 mg, 4 mg, Oral, Q6H PRN, 4 mg at 08/17/19 1239 **OR** ondansetron (ZOFRAN) injection 4 mg, 4 mg, Intravenous, Q6H PRN, Karmen Bongo,  MD, 4 mg at 08/19/19 0522 .  polyethylene glycol (MIRALAX / GLYCOLAX) packet 17 g, 17 g, Oral, Daily PRN, Karmen Bongo, MD .  potassium chloride (KLOR-CON) CR tablet 10 mEq, 10 mEq, Oral, BID, Dixie Dials, MD, 10 mEq at 08/19/19 0958 .  Resource ThickenUp Clear 120 g, 1 Can, Oral, PRN, Karmen Bongo, MD .  senna-docusate (Senokot-S) tablet 1 tablet, 1 tablet, Oral, BID, Karmen Bongo, MD, 1 tablet at 08/19/19 0959 .  zolpidem (AMBIEN) tablet 5 mg, 5 mg, Oral, QHS PRN, Karmen Bongo, MD, 5 mg at 08/18/19 1941   Patients Current Diet:     Diet Order                      DIET DYS 3 Room service appropriate? Yes with Assist; Fluid consistency: Nectar Thick  Diet effective now                      Precautions / Restrictions Precautions Precautions: Fall Precaution Comments: supplemental O2, L sided inattention, L visual field deficits Restrictions Weight Bearing Restrictions: No LLE Weight Bearing: Non weight bearing    Has the patient had 2 or more falls or a fall with injury in the past year? No   Prior Activity Level Limited Community (1-2x/wk): was not working or driivng PTA but Independent PTA. using cane for household mobility and had an aide assist with bathing (1x/week?). Managed medications independently    Prior Functional Level Self Care: Did the patient need help  bathing, dressing, using the toilet or eating? Independent   Indoor Mobility: Did the patient need assistance with walking from room to room (with or without device)? Independent   Stairs: Did the patient need assistance with internal or external stairs (with or without device)? Independent   Functional Cognition: Did the patient need help planning regular tasks such as shopping or remembering to take medications? Independent   Home Assistive Devices / Equipment Home Assistive Devices/Equipment: Other (Comment) (Need eval from PT and family needs) Home Equipment: Cane - single point   Prior Device Use: Indicate devices/aids used by the patient prior to current illness, exacerbation or injury? cane   Current Functional Level Cognition   Arousal/Alertness: Awake/alert Overall Cognitive Status: Impaired/Different from baseline Current Attention Level: Sustained Orientation Level: Oriented X4 Following Commands: Follows one step commands consistently, Follows multi-step commands inconsistently, Follows one step commands with increased time Safety/Judgement: Decreased awareness of safety, Decreased awareness of deficits General Comments: poor awareness of deficits or ability to correct balance/posture, impaired sequencing Attention: Focused, Sustained Focused Attention: Appears intact Sustained Attention: Impaired Sustained Attention Impairment: Verbal complex Memory: Impaired Memory Impairment: Storage deficit, Retrieval deficit, Decreased recall of new information (Immediate: 5/5; delayed: 3/5) Awareness: Impaired Awareness Impairment: Intellectual impairment Problem Solving: Impaired Problem Solving Impairment: Verbal complex Executive Function: Organizing, Sequencing Sequencing: Impaired Sequencing Impairment: Verbal complex (clock drawing: 0/4) Organizing: Impaired Organizing Impairment: Verbal complex (Backward digit span: 1/3)    Extremity Assessment (includes  Sensation/Coordination)   Upper Extremity Assessment: Generalized weakness, RUE deficits/detail, LUE deficits/detail RUE Deficits / Details: less impacted than L side, but with impaired gross motor coordination, 3+/5 strength RUE Sensation: WNL RUE Coordination: decreased gross motor, decreased fine motor LUE Deficits / Details: Severely impaired coordination (gross & fine motor), insensate LUE Sensation: decreased light touch, decreased proprioception LUE Coordination: decreased fine motor, decreased gross motor  Lower Extremity Assessment: RLE deficits/detail, LLE deficits/detail RLE Deficits / Details: mild incoordination, +rebound when executing LE  AROM. ~3/5 for MMT LE, not formally tested given incoordination LLE Deficits / Details: max incoordination and ataxic movement, + flexor synergy during mobility esp WB. ~3/5 for MMT, not formally tested given incoordination and + rebound during LE AROM LLE Sensation: decreased light touch, decreased proprioception LLE Coordination: decreased gross motor, decreased fine motor     ADLs   Overall ADL's : Needs assistance/impaired Eating/Feeding: Moderate assistance, Bed level Grooming: Minimal assistance, Brushing hair, Sitting Grooming Details (indicate cue type and reason): Min A for brushing hair sitting EOB. Assistance needed to maintain sitting balance EOB. Pt able to brush L side of hair without verbal cues needed. Min A needed for throughness due to tangled hair Upper Body Bathing: Maximal assistance, Bed level Lower Body Bathing: Total assistance, Bed level Upper Body Dressing : Maximal assistance, Bed level Upper Body Dressing Details (indicate cue type and reason): Max A to don hospital gown bed level, high distractability and decreased coordination  Lower Body Dressing: Total assistance, Bed level Toilet Transfer: Total assistance, +2 for physical assistance Toileting- Clothing Manipulation and Hygiene: Total assistance, Bed  level General ADL Comments: Pt requires extensive assist for all ADLs due to expansion of CVA with impairments in coordination, strength, cognition, vision, and endurance (secondary to inclusion of COPD diagnosis).      Mobility   Overal bed mobility: Needs Assistance Bed Mobility: Rolling, Sidelying to Sit, Sit to Sidelying Rolling: Mod assist Sidelying to sit: Max assist Supine to sit: Max assist, +2 for physical assistance Sit to supine: Total assist, +2 for physical assistance Sit to sidelying: Max assist General bed mobility comments: cues for rolling and technique to progress LEs off of bed and push trunk up to sitting; now leaning to left but not really pushing; MinA to maintain balance with cues to promote postural alignment     Transfers   Overall transfer level: Needs assistance Equipment used: Ambulation equipment used Transfer via Lift Equipment: Stedy Transfers: Sit to/from Stand Sit to Stand: Max assist, +2 physical assistance General transfer comment: able to briefly stand in stedy for placement of stedy seat pads but unable to manitain standing for significant amount of time     Ambulation / Gait / Stairs / Emergency planning/management officer   Ambulation/Gait General Gait Details: unable- fatigue     Posture / Balance Dynamic Sitting Balance Sitting balance - Comments: left lean, absent postural correction skills, ModA/min uces to reach to R to assist in correcting balance Balance Overall balance assessment: Needs assistance Sitting-balance support: Bilateral upper extremity supported Sitting balance-Leahy Scale: Poor Sitting balance - Comments: left lean, absent postural correction skills, ModA/min uces to reach to R to assist in correcting balance Postural control: Left lateral lean Standing balance support: Bilateral upper extremity supported Standing balance-Leahy Scale: Zero Standing balance comment: reliant on external support of stedy/therapists     Special needs/care  consideration Oxygen: yes, 3L/Min nasal cannular   Skin: ecchymosis to right, left arm, MASD to bilateral buttocks, skin tear to Left arm, wound (open or dehisced) to arm anterior, left, lower, proximal; wound (open or dehisced) to left rib, lower under breast rash; wound (open or dehisced) to buttocks mid unstageable.     and Designated visitor: Larkin Ina and Merrily Pew (sons)    Previous Environmental health practitioner (from acute therapy documentation) Living Arrangements: Children  Lives With: Son Available Help at Discharge: Family, Available 24 hours/day (lives with son who can provide physical assist, also has other son and daughter-in-law as needed) Type of Home: [place]  Layout: Two level, Able to live on main level with bedroom/bathroom Home Access: Stairs to enter Entrance Stairs-Rails: Right, Left Entrance Stairs-Number of Steps: 3 Bathroom Shower/Tub: Chiropodist: Standard Home Care Services: Yes   Discharge Living Setting Plans for Discharge Living Setting: Patient's home, Lives with (comment) (lives with son Merrily Pew ) Type of Home at Discharge: House Discharge Home Layout: Two level, Able to live on main level with bedroom/bathroom Alternate Level Stairs-Rails: None (NA) Alternate Level Stairs-Number of Steps: NA Discharge Home Access: Stairs to enter Entrance Stairs-Rails: Can reach both Entrance Stairs-Number of Steps: 3 Discharge Bathroom Shower/Tub: Other (comment) (pt sponge bathes) Discharge Bathroom Toilet: Standard Discharge Bathroom Accessibility: Yes How Accessible: Accessible via walker Does the patient have any problems obtaining your medications?: No   Social/Family/Support Systems Patient Roles: Other (Comment) (lives with son Merrily Pew) Sport and exercise psychologist Information: Josh: [phone]; other son Larkin Ina: [phone] Anticipated Caregiver: Merrily Pew and Larkin Ina  Anticipated Caregiver's Contact Information: see above Ability/Limitations of Caregiver: Mod A per  Josh Caregiver Availability: 24/7 Discharge Plan Discussed with Primary Caregiver: Yes (pt and Josh) Is Caregiver In Agreement with Plan?: Yes Does Caregiver/Family have Issues with Lodging/Transportation while Pt is in Rehab?: No   Goals Patient/Family Goal for Rehab: PT/OT: Mod A; SLP: Supervision Expected length of stay: 16-20 days Cultural Considerations: NA Pt/Family Agrees to Admission and willing to participate: Yes Program Orientation Provided & Reviewed with Pt/Caregiver Including Roles  & Responsibilities: Yes  Barriers to Discharge: Home environment access/layout, Incontinence  Barriers to Discharge Comments: stairs to enter home; will need to be a one person assist to return home safely.    Decrease burden of Care through IP rehab admission: NA   Possible need for SNF placement upon discharge: Not anticipated; pt has great family support at DC from her sons who are committed to her care and understand the anticipated DC level of Mod A.    Patient Condition: I have reviewed medical records from [facility], spoken with MD and RN, and patient and son. I met with patient at the bedside for inpatient rehabilitation assessment.  Patient will benefit from ongoing PT, OT and SLP, can actively participate in 3 hours of therapy a day 5 days of the week, and can make measurable gains during the admission.  Patient will also benefit from the coordinated team approach during an Inpatient Acute Rehabilitation admission.  The patient will receive intensive therapy as well as Rehabilitation physician, nursing, social worker, and care management interventions.  Due to bladder management, bowel management, safety, skin/wound care, disease management, medication administration, pain management and patient education the patient requires 24 hour a day rehabilitation nursing.  The patient is currently Max A +2 to total A +2 for bed mobility no tranfers since back to acute yet and Min A to  Max A for basic ADLs.  Discharge setting and therapy post discharge at home with home health is anticipated.  Patient has agreed to participate in the Acute Inpatient Rehabilitation Program and will admit today.   Preadmission Screen Completed By:  Raechel Ache, 08/19/2019 11:40 AM ______________________________________________________________________   Discussed status with Dr. Ranell Patrick on 08/19/19 at 10:58AM and received approval for admission today.   Admission Coordinator:  Raechel Ache, OT, time 10:58AM/Date 08/19/19.    Assessment/Plan: Diagnosis: Bilateral cerebral embolic infarctions 1. Does the need for close, 24 hr/day Medical supervision in concert with the patient's rehab needs make it unreasonable for this patient to be served in a less intensive setting? Yes  2. Co-Morbidities requiring supervision/potential complications: Hypertensive emergency, Acute on chronic hypoxic respiratory failure, elevated troponin, COPD. Paroxysmal afib, V tach, UTI, HLD, hyperglycemia, acute urinary retention 3. Due to bladder management, bowel management, safety, skin/wound care, disease management, medication administration, pain management and patient education, does the patient require 24 hr/day rehab nursing? Yes 4. Does the patient require coordinated care of a physician, rehab nurse, PT, OT, and SLP to address physical and functional deficits in the context of the above medical diagnosis(es)? Yes Addressing deficits in the following areas: balance, endurance, locomotion, strength, transferring, bowel/bladder control, bathing, dressing, feeding, grooming, toileting and psychosocial support 5. Can the patient actively participate in an intensive therapy program of at least 3 hrs of therapy 5 days a week? Yes 6. The potential for patient to make measurable gains while on inpatient rehab is excellent 7. Anticipated functional outcomes upon discharge from inpatient rehab: min assist PT, min assist OT,  modified independent SLP 8. Estimated rehab length of stay to reach the above functional goals is: 14-18 days 9. Anticipated discharge destination: Home 10. Overall Rehab/Functional Prognosis: good     MD Signature: Leeroy Cha, MD        Revision History                     Note Details  Author Izora Ribas, MD File Time 08/19/2019 12:00 PM  Author Type Physician Status Signed  Last Editor Izora Ribas, MD Service Physical Medicine and [place] # [id number] Admit Date 08/19/2019

## 2019-08-19 NOTE — Progress Notes (Signed)
Occupational Therapy Treatment Patient Details Name: Caitlin Vasquez MRN: [id number] DOB: [birth year] Today's Date: 08/19/2019    History of present illness 74 y.o. 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 Pt was discharged to inpatient rehab 7/6 and return to floor 7/7, where yesterday evening she developed acute onset shortness of breath with oxygen sats dropping to 75% on 3 L nasal cannula.  Her BP was elevated at 210/85. A stat CT scan of the head was obtained which showed expected evolutionary changes in the large right PCA infarct and stable appearance of the left cerebellar hematoma without significant hydrocephalus or ventriculomegaly.    OT comments  Pt progressing with OT goals and willing to participate in therapy session. Pt continues to require extensive assist for bed mobility, but demonstrated progression to grossly Min A to maintain static sitting balance. Attempted sit to stand in [place] at [facility] A x 2 but unable to tolerate longer than 10 seconds due to fatigue and SOB (SpO2 WFL throughout session on 3 L O2). Pt demonstrating improved scanning to L side when cued, but continues with R gaze preference. Plan to further address activity tolerance and ADL transfers during next session.    Follow Up Recommendations  CIR;Supervision/Assistance - 24 hour    Equipment Recommendations  3 in 1 bedside commode    Recommendations for Other Services Rehab consult    Precautions / Restrictions Precautions Precautions: Fall Precaution Comments: supplemental O2, L sided inattention, L visual field deficits Restrictions Weight Bearing Restrictions: No       Mobility Bed Mobility Overal bed mobility: Needs Assistance Bed Mobility: Rolling;Sidelying to Sit;Sit to Sidelying Rolling: Mod assist Sidelying to sit: Max  assist Supine to sit: Max assist;+2 for physical assistance Sit to supine: Total assist;+2 for physical assistance Sit to sidelying: Max assist General bed mobility comments: cues for rolling and technique to progress LEs off of bed and push trunk up to sitting; now leaning to left but not really pushing; MinA to maintain balance with cues to promote postural alignment  Transfers Overall transfer level: Needs assistance Equipment used: Ambulation equipment used Transfers: Sit to/from Stand Sit to Stand: Max assist;+2 physical assistance         General transfer comment: able to briefly stand in stedy for placement of stedy seat pads but unable to manitain standing for significant amount of time    Balance Overall balance assessment: Needs assistance Sitting-balance support: Bilateral upper extremity supported Sitting balance-Leahy Scale: Poor Sitting balance - Comments: left lean, absent postural correction skills, ModA/min uces to reach to R to assist in correcting balance Postural control: Left lateral lean Standing balance support: Bilateral upper extremity supported Standing balance-Leahy Scale: Zero Standing balance comment: reliant on external support of stedy/therapists                           ADL either performed or assessed with clinical judgement   ADL Overall ADL's : Needs assistance/impaired     Grooming: Minimal assistance;Brushing hair;Sitting Grooming Details (indicate cue type and reason): Min A for brushing hair sitting EOB. Assistance needed to maintain sitting balance EOB. Pt able to brush L side of hair without verbal cues needed. Min A needed for throughness due to tangled hair  General ADL Comments: Pt requires extensive assist for all ADLs due to expansion of CVA with impairments in coordination, strength, cognition, vision, and endurance (secondary to inclusion of COPD diagnosis).      Vision    Vision Assessment?: Yes   Perception     Praxis      Cognition Arousal/Alertness: Awake/alert Behavior During Therapy: Flat affect Overall Cognitive Status: Impaired/Different from baseline Area of Impairment: Attention;Following commands;Memory;Safety/judgement;Awareness;Problem solving                   Current Attention Level: Sustained Memory: Decreased recall of precautions Following Commands: Follows one step commands consistently;Follows multi-step commands inconsistently;Follows one step commands with increased time Safety/Judgement: Decreased awareness of safety;Decreased awareness of deficits Awareness: Emergent Problem Solving: Slow processing;Decreased initiation;Difficulty sequencing;Requires verbal cues;Requires tactile cues General Comments: poor awareness of deficits or ability to correct balance/posture, impaired sequencing        Exercises     Shoulder Instructions       General Comments SpO2 WFL on 3 L O2, HR WFL. Pt SOB with DOE 2/4 with activity    Pertinent Vitals/ Pain       Pain Assessment: Faces Faces Pain Scale: Hurts a little bit Pain Location: generalized Pain Descriptors / Indicators: Aching;Discomfort Pain Intervention(s): Monitored during session;Limited activity within patient's tolerance;Repositioned  Home Living                                          Prior Functioning/Environment              Frequency  Min 2X/week        Progress Toward Goals  OT Goals(current goals can now be found in the care plan section)  Progress towards OT goals: Progressing toward goals  Acute Rehab OT Goals Patient Stated Goal: to be able to eat, get back to CIR and then home OT Goal Formulation: With patient Time For Goal Achievement: 08/29/19 Potential to Achieve Goals: Good ADL Goals Pt Will Perform Eating: with min assist;with adaptive utensils;sitting Pt Will Perform Grooming: with min assist;sitting Pt Will  Perform Upper Body Bathing: with min assist;sitting Pt Will Perform Lower Body Bathing: with min assist;sit to/from stand Pt Will Perform Upper Body Dressing: with min assist;sitting Pt Will Transfer to Toilet: with min assist;bedside commode Additional ADL Goal #1: Pt will locate 3/3 items in L visual field during ADL tasks with Min verbal cues Additional ADL Goal #2: Pt to improve static sitting balance to Min A for > 60 seconds in order to prepare for ADL transfers.  Plan Discharge plan remains appropriate    Co-evaluation    PT/OT/SLP Co-Evaluation/Treatment: Yes Reason for Co-Treatment: Complexity of the patient's impairments (multi-system involvement);For patient/therapist safety;To address functional/ADL transfers PT goals addressed during session: Mobility/safety with mobility;Balance;Strengthening/ROM OT goals addressed during session: ADL's and self-care      AM-PAC OT "6 Clicks" Daily Activity     Outcome Measure   Help from another person eating meals?: A Little Help from another person taking care of personal grooming?: A Little Help from another person toileting, which includes using toliet, bedpan, or urinal?: Total Help from another person bathing (including washing, rinsing, drying)?: Total Help from another person to put on and taking off regular upper body clothing?: A Lot Help from another person to put on and taking off regular lower body clothing?: Total 6 Click Score: 11  End of Session Equipment Utilized During Treatment: Oxygen;Gait belt  OT Visit Diagnosis: Unsteadiness on feet (R26.81);Other abnormalities of gait and mobility (R26.89);Muscle weakness (generalized) (M62.81);Ataxia, unspecified (R27.0);Low vision, both eyes (H54.2);Apraxia (R48.2);Other symptoms and signs involving cognitive function;Pain   Activity Tolerance Patient limited by fatigue   Patient Left in bed;with call bell/phone within reach;with bed alarm set;with family/visitor  present   Nurse Communication          Time: 3790-2409 OT Time Calculation (min): 23 min  Charges: OT General Charges $OT Visit: 1 Visit OT Treatments $Therapeutic Activity: 8-22 mins  Lorre Munroe, OTR/L   Lorre Munroe 08/19/2019, 11:40 AM

## 2019-08-19 NOTE — Progress Notes (Signed)
  Speech Language Pathology Treatment: Dysphagia  Patient Details Name: Caitlin Vasquez MRN: [id number] DOB: Nov 26, 1945 Today's Date: 08/19/2019 Time: 7341-9379 SLP Time Calculation (min) (ACUTE ONLY): 28 min  Assessment / Plan / Recommendation Clinical Impression  Pt seen today for dysphagia treatment/management.  She was asleep but did awaken to SLP stimulation.  Pt clinically is congested - and does not clear with cued cough although she makes great effort.  SLP educated pt to importance of oral care, strengthening cough/expectoration abilities for airway protection and importance of resting if dyspneic due to negative impact on reciprocity of swallow/respirations.  Using teach back, pt verbalized precautions.    Observed pt with intake of cranberry juice thickened and small single ice chips.  Clinically pt with delayed swallow- due to cerebellar stroke impacting coordination but no overt s/s of aspiration (coughing).      Pt required maximum verbal/tactile cues to take smaller boluses - ? Component of ataxia impacting control.  Continued full supervision needed to help in controlling bolus amount.  She did not have consistent sensation to aspiration on MBS and thus following vitals will be indicated to assure tolerance.  Intake listed as 20-60% and WBC lower - from 12.8 to 11.1.  Inattention to left side noted but pt able to follow directions to turn head with moderate cues.    Given pt level of COPD, decreased mobility, weak cough and dysphagia - it may be of benefit to consider palliative consult as her aspiration pna risk is elevated with above risk factors.  SLP to continue to follow for dysphagia treatment to mitigate aspiration risk.  Pt agreeable to plan.       HPI HPI: Pt is a 74 y.o. female with medical history significant of COPD, Afib; PVD s/p L fem-pop bypass and B CIA stenting; and HLD.  She was hospitalized from 7/3-7/6 with an embolic R PCA  with hemorrhagic cerebellar  transformation with SAH. She was admitted to rehab on 7/6 with ongoing mild AMS, left inattention, LUE weakness, and tendency to "slump to the left."  She developed acute SOB on 7/7 with sats 75% on 3L [place] O2. CXR 7/7: Bilateral interstitial opacity, likely edema superimposed on the patient's emphysema. CT head: Interval evolution of large right PCA territory infarct involving the parasagittal temporal lobe, occipital lobe, and right basal ganglia. MBS 7/5: mild oropharyngeal dysphagia due to impaired coordination, timing, and oral control. She does not fully masticate solid foods, although she does clear the pieces of food that aren't chewed. There is reduced posterior transit and decreased bolus cohesion with varying amounts of mild-moderate lingual residue across different boluses, aspiration of thin liquids with inconsistent sensation.       SLP Plan  Continue with current plan of care       Recommendations  Diet recommendations: Dysphagia 3 (mechanical soft);Nectar-thick liquid Liquids provided via: Cup;No straw Medication Administration: Whole meds with puree (start and follow with liquids) Supervision: Staff to assist with self feeding Compensations: Slow rate;Small sips/bites Postural Changes and/or Swallow Maneuvers: Seated upright 90 degrees                Follow up Recommendations: Inpatient Rehab SLP Visit Diagnosis: Dysphagia, oropharyngeal phase (R13.12) Plan: Continue with current plan of care       GO                Chales Abrahams 08/19/2019, 8:04 AM   Rolena Infante, MS [facility] SLP Acute Rehab Services Office [phone]

## 2019-08-19 NOTE — Progress Notes (Signed)
Physical Therapy Treatment Patient Details Name: Caitlin Vasquez MRN: [id number] DOB: April 30, 1945 Today's Date: 08/19/2019    History of Present Illness 74 y.o. 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 Pt was discharged to inpatient rehab 7/6 and return to floor 7/7, where yesterday evening she developed acute onset shortness of breath with oxygen sats dropping to 75% on 3 L nasal cannula.  Her BP was elevated at 210/85. A stat CT scan of the head was obtained which showed expected evolutionary changes in the large right PCA infarct and stable appearance of the left cerebellar hematoma without significant hydrocephalus or ventriculomegaly.     PT Comments    Patient received in bed, pleasant and willing to work with therapies but fatigued this morning. See below for physical assist levels/mobility. Continues to demonstrate considerable left lean with absent corrective or protective reactions when support for balance is removed or challenged. In general needed MinA to maintain upright at EOB; worked on reaching right to correct midline sitting but unable to consistently maintain. Able to stand briefly with maxAx2 in stedy but only long enough to place/remove seat pedals due to fatigue. SOB but SpO2 WNL on nasal cannula/supplemental O2. Returned to bed and positioned to comfort at EOS. Left in bed with all needs met, bed alarm active. Continue to feel she will benefit from CIR.    Follow Up Recommendations  CIR;Supervision/Assistance - 24 hour     Equipment Recommendations  Wheelchair (measurements PT);Wheelchair cushion (measurements PT);Hospital bed;Other (comment)    Recommendations for Other Services       Precautions / Restrictions Precautions Precautions: Fall Precaution Comments: supplemental O2, L sided inattention, L  visual field deficits Restrictions Weight Bearing Restrictions: No    Mobility  Bed Mobility Overal bed mobility: Needs Assistance Bed Mobility: Rolling;Sidelying to Sit;Sit to Sidelying Rolling: Mod assist Sidelying to sit: Max assist     Sit to sidelying: Max assist General bed mobility comments: cues for rolling and technique to progress LEs off of bed and push trunk up to sitting; now leaning to left but not really pushing; MinA to maintain balance with cues to promote postural alignment  Transfers Overall transfer level: Needs assistance Equipment used: Ambulation equipment used Transfers: Sit to/from Stand Sit to Stand: Max assist;+2 physical assistance         General transfer comment: able to briefly stand in stedy for placement of stedy seat pads but unable to manitain standing for significant amount of time  Ambulation/Gait             General Gait Details: unable- fatigue   Stairs             Wheelchair Mobility    Modified Rankin (Stroke Patients Only)       Balance Overall balance assessment: Needs assistance Sitting-balance support: Bilateral upper extremity supported Sitting balance-Leahy Scale: Poor Sitting balance - Comments: left lean, absent postural correction skills, ModA/min uces to reach to R to assist in correcting balance Postural control: Left lateral lean Standing balance support: Bilateral upper extremity supported Standing balance-Leahy Scale: Zero Standing balance comment: reliant on external support of stedy/therapists                            Cognition Arousal/Alertness: Awake/alert Behavior During Therapy: Flat affect Overall Cognitive Status: Impaired/Different from baseline Area of Impairment:  Attention;Following commands;Memory;Safety/judgement;Awareness;Problem solving                   Current Attention Level: Sustained Memory: Decreased recall of precautions Following Commands: Follows  one step commands consistently;Follows multi-step commands inconsistently;Follows one step commands with increased time Safety/Judgement: Decreased awareness of safety;Decreased awareness of deficits Awareness: Emergent Problem Solving: Slow processing;Decreased initiation;Difficulty sequencing;Requires verbal cues;Requires tactile cues General Comments: poor awareness of deficits or ability to correct balance/posture, impaired sequencing      Exercises      General Comments        Pertinent Vitals/Pain Pain Assessment: Faces Faces Pain Scale: Hurts a little bit Pain Location: generalized Pain Descriptors / Indicators: Aching;Discomfort Pain Intervention(s): Limited activity within patient's tolerance;Monitored during session    Home Living                      Prior Function            PT Goals (current goals can now be found in the care plan section) Acute Rehab PT Goals Patient Stated Goal: to be able to eat, get back to CIR and then home PT Goal Formulation: With patient Time For Goal Achievement: 08/29/19 Potential to Achieve Goals: Fair Progress towards PT goals: Progressing toward goals    Frequency    Min 4X/week      PT Plan Current plan remains appropriate    Co-evaluation PT/OT/SLP Co-Evaluation/Treatment: Yes Reason for Co-Treatment: Complexity of the patient's impairments (multi-system involvement);For patient/therapist safety;To address functional/ADL transfers PT goals addressed during session: Mobility/safety with mobility;Balance;Strengthening/ROM        AM-PAC PT "6 Clicks" Mobility   Outcome Measure  Help needed turning from your back to your side while in a flat bed without using bedrails?: A Lot Help needed moving from lying on your back to sitting on the side of a flat bed without using bedrails?: A Lot Help needed moving to and from a bed to a chair (including a wheelchair)?: Total Help needed standing up from a chair using  your arms (e.g., wheelchair or bedside chair)?: Total Help needed to walk in hospital room?: Total Help needed climbing 3-5 steps with a railing? : Total 6 Click Score: 8    End of Session Equipment Utilized During Treatment: Oxygen Activity Tolerance: Patient tolerated treatment well;Patient limited by fatigue Patient left: in bed;with call bell/phone within reach;with bed alarm set;with family/visitor present Nurse Communication: Mobility status PT Visit Diagnosis: Hemiplegia and hemiparesis;Muscle weakness (generalized) (M62.81) Hemiplegia - Right/Left: Left Hemiplegia - dominant/non-dominant: Non-dominant Hemiplegia - caused by: Cerebral infarction;Nontraumatic intracerebral hemorrhage     Time: 2951-8841 PT Time Calculation (min) (ACUTE ONLY): 23 min  Charges:  $Therapeutic Activity: 8-22 mins (co-tx with OT)                     Windell Norfolk, DPT, PN1   Supplemental Physical Therapist [place]    Pager [phone] Acute Rehab Office [phone]

## 2019-08-19 NOTE — Discharge Instructions (Signed)
Pulmonary Edema Pulmonary edema is unusual buildup of fluid in the lungs. It makes it hard for a person to breathe. This condition is an emergency. Follow these instructions at home: Medicines  Take over-the-counter and prescription medicines only as told by your doctor.  If you were prescribed an antibiotic medicine, take it as told by your doctor. Do not stop taking the antibiotic even if you start to feel better.  Ask your doctor to help you write a plan with information about each medicine you take. This may include: ? Why you are taking it. ? Possible side effects. ? Best time of day to take it. ? Foods to take with it, or foods to avoid. ? When to stop taking it.  Make a list of each medicine, vitamin, or herbal supplement you take. ? Keep the list with you at all times. ? Show the list to your doctor at each visit and before starting a new medicine. ? Keep the list up to date. Lifestyle   Do regular exercise as told by your doctor. It is important to do it safely. You can do this by: ? Asking your doctor what exercises and activities are good and safe for you to do. ? Pacing your activities. This will prevent shortness of breath or chest pain. ? Resting for at least 1 hour before and after meals. ? Asking about cardiac rehabilitation programs. These may include:  Education.  Exercise plans.  Counseling.  Eat a heart-healthy diet that is low in salt, saturated fat, and cholesterol. Your doctor may suggest foods that are high in fiber, such as: ? Fresh fruits and vegetables. ? Whole grains. ? Beans.  Do not use any products that contain nicotine or tobacco, such as cigarettes and e-cigarettes. If you need help quitting, ask your doctor. General instructions  Keep a record of your weight. ? Record your hospital or clinic weight. When you get home, compare it to your scale and record your weight. ? Weigh yourself first thing in the morning each day, and record the  weights. You should weigh yourself every morning after you pee and before you eat breakfast. Wear the same amount of clothing each time you weigh yourself. ? Share your weight record with your doctor. These can help your doctor see if your body is holding extra fluid. ? Tell your doctor right away if you have gained weight quickly, or if you have gained weight as told by your doctor. Your medicines may need to be adjusted.  Check your blood pressure as often as told by your doctor. ? Buy a home blood pressure cuff at your drugstore. ? Record your blood pressure readings. Bring them with you for your clinic visits.  Stay at a healthy weight. Ask your doctor what weight is healthy for you.  Think about doing therapy or being a part of a support group.  Keep all follow-up visits as told by your doctor. This is important. Contact a doctor if:  You have questions about your medicines.  You miss a dose of your medicine. Get help right away if:  You have very bad chest pain, especially if the pain is crushing or pressure-like and spreads to the arms, back, neck, or jaw.  You have more swelling in your hands, feet, ankles, or belly (abdomen).  You feel sick to your stomach (nauseous).  You have strange sweating.  Your skin turns blue or pale.  Your shortness of breath gets worse.  You feel dizzy or   unsteady.  Your vision is blurry.  You have a headache.  You cough up bloody split.  You cannot sleep because it is hard to breathe.  You start to feel a "jumping" or "fluttering" sensation (palpitations) in the chest that is unusual for you.  You feel like you cannot get enough air.  You gain weight rapidly. These symptoms may be an emergency. Do not wait to see if the symptoms will go away. Get medical help right away. Call your local emergency services (911 in the [place] Do not drive yourself to the hospital. Summary  Pulmonary edema is unusual fluid buildup in the lungs that  can make it hard to breathe.  This condition is an emergency.  Keep a record of your weight. Call your doctor if you gain weight rapidly. This information is not intended to replace advice given to you by your health care provider. Make sure you discuss any questions you have with your health care provider. Document Revised: 01/06/2017 Document Reviewed: 04/26/2016 [employer]vier Patient Education  2020 [employer]  

## 2019-08-19 NOTE — Discharge Summary (Signed)
Physician Discharge Summary  CEDRICKA SACKRIDER ZOX:[id number] DOB: [birth year] DOA: 08/14/2019  PCP: [name], MD  Admit date: 08/14/2019 Discharge date: 08/19/2019  Admitted From: CIR Disposition: CIR  Recommendations for Outpatient Follow-up:  1. Follow up with PCP in 1-2 weeks 2. Please obtain BMP/CBC in one week 3. Please follow up with your PCP on the following pending results: Unresulted Labs (From admission, onward) Comment         None       Home Health: None Equipment/Devices: None  Discharge Condition: Stable CODE STATUS: Full code Diet recommendation: Cardiac  Subjective: Seen and examined.  She has no complaints.  Brief/Interim Summary: Patient is a 74 year old female with history of COPD on home oxygen, A. 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 fib, not on anticoagulation presented and readmitted for respiratory distress in CIR.  On her previous hospitalization, she had hypertensive emergency and was treated with Cleviprex and started on Coreg and Norvasc.She was just discharged to [facility] by neurology a day before admission here.  She developed acute respiratory distress in CIR and desaturated to 75% on 3 L oxygen nasal cannula.  BP was high.  Chest x-ray was concerning for pulmonary edema.  She was admitted to medical floor under hospitalist service.  She received Lasix and Nitropaste.  Respiratory status has improved.  She was found to have severely elevated troponin.  Denied any chest pain.  EKG showed ST changes on the lateral lead so cardiology was consulted and echocardiogram done on 08/11/19 showed normal ejection fraction with grade 1 diastolic dysfunction.  Cardiology recommended medical management and opined that her elevated troponin was likely due  to demand ischemia.  Cardiology added HCTZ to her regimen.  She was also diagnosed with UTI. Culture of urine sent on 08/12/2019 showed 2 organisms: E. coli and Streptococcus Gallolyticus. started on Rocephin initially and switched to cefdinir and completed course of antibiotics.  Despite of the regime that she was on, patient's blood pressure was still slightly elevated so lisinopril 10 mg was added to her regimen her blood pressure is much better now.  She has remained stable otherwise or the course of last 2 to 3 days waiting for CIR bed which could not be arranged due to weekend.  Now the bed is arranged and she is stable and requiring 2 to 3 L of oxygen which is her baseline so she is going to be discharged.  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 - d/t AF not on [facility] Active Problems:   COPD with chronic bronchitis and emphysema (HCC)   On home oxygen therapy   Atrial fibrillation (HCC)   Hypertensive emergency   E-coli UTI   Dyslipidemia   Acute pulmonary edema (HCC)    Discharge Instructions   Allergies as of 08/19/2019      Reactions   Clonidine Derivatives Other (See Comments)   hypotension      Medication List    STOP taking these medications   cefTRIAXone 1 g in sodium chloride 0.9 % 100 mL     TAKE these medications   acetaminophen 500 MG tablet Commonly known as: TYLENOL Take 500 mg by mouth 2 (two) times daily as needed for mild pain.   albuterol  108 (90 Base) MCG/ACT inhaler Commonly known as: VENTOLIN HFA Inhale 2 puffs into the lungs every 6 (six) hours as needed for wheezing or shortness of breath.   amLODipine 5 MG tablet Commonly known as: NORVASC Take 1 tablet (5 mg total) by mouth daily.   aspirin 81 MG EC tablet Take 1 tablet (81 mg total) by mouth daily.   atorvastatin 80 MG tablet Commonly known as: LIPITOR Take 1 tablet (80 mg total) by  mouth daily.   carvedilol 12.5 MG tablet Commonly known as: COREG Take 1 tablet (12.5 mg total) by mouth 2 (two) times daily with a meal.   enoxaparin 40 MG/0.4ML injection Commonly known as: LOVENOX Inject 0.4 mLs (40 mg total) into the skin daily.   hydrochlorothiazide 12.5 MG capsule Commonly known as: MICROZIDE Take 1 capsule (12.5 mg total) by mouth daily. Start taking on: August 20, 2019   lisinopril 10 MG tablet Commonly known as: ZESTRIL Take 1 tablet (10 mg total) by mouth daily. Start taking on: August 20, 2019   Resource ThickenUp Clear Powd Take 120 g by mouth as needed (nectar thick liquids).   senna-docusate 8.6-50 MG tablet Commonly known as: Senokot-S Take 1 tablet by mouth 2 (two) times daily.   sodium chloride 0.9 % infusion Inject 50 mLs into the vein continuous.       Follow-up Information    [name], MD Follow up in 1 week(s).   Specialty: Cardiology Contact information: [address] [place] [address] [phone]              Allergies  Allergen Reactions  . Clonidine Derivatives Other (See Comments)    hypotension    Consultations: Cardiology   Procedures/Studies: CT ANGIO HEAD W OR WO CONTRAST  Result Date: 08/11/2019 CLINICAL DATA:  74 year old female code stroke presentation yesterday with acute left cerebellar hemorrhage. Ataxia. And MRI reveals superimposed acute right PCA territory infarct. Motion degraded CTA head and neck at [facility] yesterday. EXAM: CT ANGIOGRAPHY HEAD AND NECK TECHNIQUE: Multidetector CT imaging of the head and neck was performed using the standard protocol during bolus administration of intravenous contrast. Multiplanar CT image reconstructions and MIPs were obtained to evaluate the vascular anatomy. Carotid stenosis measurements (when applicable) are obtained utilizing NASCET criteria, using the distal internal carotid diameter as the denominator. CONTRAST:  50mL OMNIPAQUE IOHEXOL 350  MG/ML SOLN COMPARISON:  Brain MRI, head CT, and CTA head and neck 08/10/2019. FINDINGS: CTA NECK Skeleton: Most of the dentition is absent. Multilevel cervical spine degeneration with some levels of posterior element ankylosis. Mild T5 compression fracture appears nonacute. No acute osseous abnormality identified. Upper chest: Centrilobular emphysema throughout the visible lungs. No superior mediastinal lymphadenopathy. Other neck: Negative. Aortic arch: Extensive Calcified aortic atherosclerosis. Including a small area of ulcerated plaque at the distal arch on series 5, image 155, stable. Three vessel arch configuration. Right carotid system: Brachiocephalic and right CCA soft and calcified plaque without stenosis. Calcified plaque at the right ICA origin and bulb without stenosis. However, there is a focal 65 % stenosis just distal to the bulb best seen on series 8, image 92 which appears related to either soft plaque or perhaps a carotid web. The right ICA remains patent to the skull base. Left carotid system: Abundant soft and calcified plaque at the proximal left CCA with less than 50% stenosis. Complex calcified plaque at the left ICA origin, and at the distal bulb a short segment high-grade radiographic string sign stenosis occurs  as seen on series 9, image 95. The left ICA remains patent to the skull base. Vertebral arteries: Proximal right subclavian artery plaque without stenosis. Dominant right vertebral artery with calcified plaque near the origin but no stenosis. Mild right V1 through V3 segment calcified plaque and tortuosity but no significant stenosis to the skull base. Proximal left subclavian artery soft and calcified plaque without significant stenosis. Non dominant left vertebral artery with severe origin stenosis (series 8, image 125) and tandem severe stenosis in the proximal V2 segment. Additional severe left V3 segment stenosis (series 8, image 118). The vessel is diminutive and functionally  terminates or is occluded at the dura. CTA HEAD Posterior circulation: Right vertebral artery supplies the basilar with V4 segment calcification resulting in up to moderate stenosis on series 7, image 138, which is proximal to the patent right PICA origin. Patent vertebrobasilar junction with probable retrograde supply to the left PICA which remains patent. Area of left cerebellar intra-axial hemorrhage on series 7, image 138. No CTA spot sign. No abnormal vascularity. Patent basilar artery without stenosis. Probable infundibulum rather than 3 mm saccular aneurysm at the left basilar tip, origin of the left SCA. Right SCA and left PCA origins are within normal limits. Posterior communicating arteries are diminutive or absent. However, just past the right PCA origin there is severe right PCA stenosis, and a diminutive appearance of the remainder of the vessel. No right PCA occlusion. Left PCA branches are mildly irregular. Anterior circulation: Both ICA siphons are patent but heavily calcified. On the left there is mild stenosis, but a superimposed 3-4 mm saccular aneurysm directed inferiorly from the distal left ICA best seen on series 9, image 84. On the right there is mild to moderate siphon stenosis in the cavernous and supraclinoid segments. Patent carotid termini. Patent MCA and ACA origins. Moderate stenosis of the right ACA A1 segment (series 11, image 19). Anterior communicating artery within normal limits. Median artery of the corpus callosum (normal variant). Mild bilateral distal ACA branch irregularity (series 12, image 16). Left MCA M1 segment and bifurcation are patent, but there is a severe stenosis at the origin of the superior left M2 division (series 9, image 102 and series 11, image 119). However, no right MCA branch occlusion is identified. Left MCA M1 segment bifurcates early without stenosis. Right MCA branches are mildly irregular. Venous sinuses: Patent. Anatomic variants: Dominant right  vertebral artery which supplies the basilar. Review of the MIP images confirms the above findings IMPRESSION: 1. Negative for CTA spot sign or abnormal vascularity in the region of the left cerebellar parenchymal hemorrhage. 2. 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 3. Positive also for severe atherosclerosis throughout the head and neck. 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 - high-grade stenosis at the Left ICA distal bulb resulting in a short segment radiographic string sign stenosis. - 65% stenosis at the Right ICA distal bulb related to either soft plaque versus carotid web. - mild to moderate bilateral ICA siphon stenosis greater on the right. - Severe stenosis of the proximal Right PCA which is diminutive but does not occlude. - moderate stenosis of the dominant Right Vertebral Artery V4 segment which supplies the  Basilar. - moderate stenosis Right ACA A1 segment. - Severe stenosis Right MCA M2 superior division origin. 4. Aortic Atherosclerosis (ICD10-I70.0) and Emphysema (ICD10-J43.9). Electronically Signed   By: Odessa Fleming M.D.   On: 08/11/2019 12:33   CT HEAD WO CONTRAST  Result Date: 08/14/2019 CLINICAL DATA:  Intraparenchymal hemorrhage, acute, progressive altered mental status EXAM: CT HEAD WITHOUT CONTRAST TECHNIQUE: Contiguous axial images were obtained from the base of the skull through the vertex without intravenous contrast. COMPARISON:  MRI 08/10/2019, CT 08/10/2019 FINDINGS: Brain: The intraparenchymal hematoma seen within the inferior left cerebellar hemisphere is unchanged measuring 9 mm x 12 mm. A small amount of  surrounding vasogenic edema is again identified, stable since prior examination. Since the prior examination, there has been evolution of the infarct involving the right PCA territory involving the parasagittal right temporal lobe and right occipital lobe. Mild associated mass effect is present with effacement of the overlying sulci. No superimposed acute hemorrhage in this region. Additionally, there is evolution of the a patchy infarcts within the right basal ganglia, primarily involving the right thalamus and medial lentiform nucleus. There has, however, developed a tiny focus of hemorrhage within the right globus pallidus. No significant midline shift. No extra-axial fluid collection. Ventricular size is normal. Vascular: Extensive atherosclerotic calcification noted within the terminal right vertebral artery and carotid siphons. Skull: Intact Sinuses/Orbits: Paranasal sinuses are clear. Orbits are unremarkable. Other: Mastoid air cells and middle ear cavities are clear. IMPRESSION: Interval evolution of large right PCA territory infarct involving the parasagittal temporal lobe, occipital lobe, and right basal ganglia. Tiny focus of probable petechial hemorrhage now evident within the right globus pallidus. Mild mass effect without midline shift. Stable intraparenchymal hematoma and associated edema involving the left cerebellar lobe. Electronically Signed   By: Helyn Numbers MD   On: 08/14/2019 11:02   CT Head Wo Contrast  Result Date: 08/10/2019 CLINICAL DATA:  Ataxia, stroke suspected EXAM: CT HEAD WITHOUT CONTRAST TECHNIQUE: Contiguous axial images were obtained from the base of the skull through the vertex without intravenous contrast. COMPARISON:  CT and CT angiography 08/10/2019 FINDINGS: Brain: Redemonstration of the parenchymal hemorrhage within the paramedian inferior left cerebellar hemisphere with associated adjacent subarachnoid hemorrhage into the fourth ventricle and extending laterally into  the basal cisterns. Overall extent of this hemorrhage is not significantly changed from the comparison CT. No new areas of hyperdense hemorrhage are seen. No significant resulting mass effect. No CT evidence of a large acute vascular territory or cortically based infarct. Several stable hypoattenuating foci in the bilateral basal ganglia likely reflect sequela of remote lacunar infarcts. Benign senescent mineralization of the basal ganglia, similar to prior. Symmetric prominence of the ventricles, cisterns and sulci compatible with parenchymal volume loss. Patchy areas of white matter hypoattenuation are most compatible with chronic microvascular angiopathy. Vascular: Extensive calcification within the carotid siphons and right intradural vertebral artery. Skull: No calvarial fracture or suspicious osseous lesion. No scalp swelling or hematoma. Sinuses/Orbits: Paranasal sinuses and mastoid air cells are predominantly clear. Included orbital structures are unremarkable. Other: Absence of the maxillary dentition with the mandibular bridge. IMPRESSION: 1. Redemonstrated parenchymal hemorrhage within the paramedian inferior left cerebellum with associated subarachnoid hemorrhage slightly redistributed from prior without significant interval increase in the size of the hemorrhage or new areas of hemorrhage identified. 2. Stable parenchymal volume loss and chronic microvascular angiopathy. 3. Remote lacunar infarcts in the bilateral basal ganglia. Electronically Signed   By: Kreg Shropshire M.D.   On: 08/10/2019 20:08  CT ANGIO NECK W OR WO CONTRAST  Result Date: 08/11/2019 CLINICAL DATA:  74 year old female code stroke presentation yesterday with acute left cerebellar hemorrhage. Ataxia. And MRI reveals superimposed acute right PCA territory infarct. Motion degraded CTA head and neck at [facility] yesterday. EXAM: CT ANGIOGRAPHY HEAD AND NECK TECHNIQUE: Multidetector CT imaging of the head and neck was performed  using the standard protocol during bolus administration of intravenous contrast. Multiplanar CT image reconstructions and MIPs were obtained to evaluate the vascular anatomy. Carotid stenosis measurements (when applicable) are obtained utilizing NASCET criteria, using the distal internal carotid diameter as the denominator. CONTRAST:  50mL OMNIPAQUE IOHEXOL 350 MG/ML SOLN COMPARISON:  Brain MRI, head CT, and CTA head and neck 08/10/2019. FINDINGS: CTA NECK Skeleton: Most of the dentition is absent. Multilevel cervical spine degeneration with some levels of posterior element ankylosis. Mild T5 compression fracture appears nonacute. No acute osseous abnormality identified. Upper chest: Centrilobular emphysema throughout the visible lungs. No superior mediastinal lymphadenopathy. Other neck: Negative. Aortic arch: Extensive Calcified aortic atherosclerosis. Including a small area of ulcerated plaque at the distal arch on series 5, image 155, stable. Three vessel arch configuration. Right carotid system: Brachiocephalic and right CCA soft and calcified plaque without stenosis. Calcified plaque at the right ICA origin and bulb without stenosis. However, there is a focal 65 % stenosis just distal to the bulb best seen on series 8, image 92 which appears related to either soft plaque or perhaps a carotid web. The right ICA remains patent to the skull base. Left carotid system: Abundant soft and calcified plaque at the proximal left CCA with less than 50% stenosis. Complex calcified plaque at the left ICA origin, and at the distal bulb a short segment high-grade radiographic string sign stenosis occurs as seen on series 9, image 95. The left ICA remains patent to the skull base. Vertebral arteries: Proximal right subclavian artery plaque without stenosis. Dominant right vertebral artery with calcified plaque near the origin but no stenosis. Mild right V1 through V3 segment calcified plaque and tortuosity but no significant  stenosis to the skull base. Proximal left subclavian artery soft and calcified plaque without significant stenosis. Non dominant left vertebral artery with severe origin stenosis (series 8, image 125) and tandem severe stenosis in the proximal V2 segment. Additional severe left V3 segment stenosis (series 8, image 118). The vessel is diminutive and functionally terminates or is occluded at the dura. CTA HEAD Posterior circulation: Right vertebral artery supplies the basilar with V4 segment calcification resulting in up to moderate stenosis on series 7, image 138, which is proximal to the patent right PICA origin. Patent vertebrobasilar junction with probable retrograde supply to the left PICA which remains patent. Area of left cerebellar intra-axial hemorrhage on series 7, image 138. No CTA spot sign. No abnormal vascularity. Patent basilar artery without stenosis. Probable infundibulum rather than 3 mm saccular aneurysm at the left basilar tip, origin of the left SCA. Right SCA and left PCA origins are within normal limits. Posterior communicating arteries are diminutive or absent. However, just past the right PCA origin there is severe right PCA stenosis, and a diminutive appearance of the remainder of the vessel. No right PCA occlusion. Left PCA branches are mildly irregular. Anterior circulation: Both ICA siphons are patent but heavily calcified. On the left there is mild stenosis, but a superimposed 3-4 mm saccular aneurysm directed inferiorly from the distal left ICA best seen on series 9, image 84. On the right there is mild  to moderate siphon stenosis in the cavernous and supraclinoid segments. Patent carotid termini. Patent MCA and ACA origins. Moderate stenosis of the right ACA A1 segment (series 11, image 19). Anterior communicating artery within normal limits. Median artery of the corpus callosum (normal variant). Mild bilateral distal ACA branch irregularity (series 12, image 16). Left MCA M1 segment  and bifurcation are patent, but there is a severe stenosis at the origin of the superior left M2 division (series 9, image 102 and series 11, image 119). However, no right MCA branch occlusion is identified. Left MCA M1 segment bifurcates early without stenosis. Right MCA branches are mildly irregular. Venous sinuses: Patent. Anatomic variants: Dominant right vertebral artery which supplies the basilar. Review of the MIP images confirms the above findings IMPRESSION: 1. Negative for CTA spot sign or abnormal vascularity in the region of the left cerebellar parenchymal hemorrhage. 2. 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 3. Positive also for severe atherosclerosis throughout the head and neck. 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 - high-grade stenosis at the Left ICA distal bulb resulting in a short segment radiographic string sign stenosis. - 65% stenosis at the Right ICA distal bulb related to either soft plaque versus carotid web. - mild to moderate bilateral ICA siphon stenosis greater on the right. - Severe stenosis of the proximal Right PCA which is diminutive but does not occlude. - moderate stenosis of the dominant Right Vertebral Artery V4 segment which supplies the Basilar. - moderate stenosis Right ACA A1 segment. - Severe stenosis Right MCA M2 superior division origin. 4. Aortic Atherosclerosis (ICD10-I70.0) and Emphysema (ICD10-J43.9). Electronically Signed   By: Odessa Fleming M.D.   On: 08/11/2019 12:33   MR BRAIN W WO CONTRAST  Result Date: 08/11/2019 CLINICAL DATA:  Follow-up examination for acute intracranial hemorrhage,  stroke. EXAM: MRI HEAD WITHOUT AND WITH CONTRAST TECHNIQUE: Multiplanar, multiecho pulse sequences of the brain and surrounding structures were obtained without and with intravenous contrast. CONTRAST:  5.56mL GADAVIST GADOBUTROL 1 MMOL/ML IV SOLN COMPARISON:  Prior CTs from earlier the same day. FINDINGS: Brain: Examination moderately to severely degraded by motion artifact. Diffuse prominence of the CSF containing spaces compatible with moderately advanced age-related cerebral atrophy. Patchy and confluent T2/FLAIR hyperintensity within the periventricular deep white matter both cerebral hemispheres most consistent with chronic small vessel ischemic disease, moderate nature. Scattered superimposed remote lacunar infarcts present about the bilateral basal ganglia and thalami. Patchy and confluent areas of restricted diffusion involving the parasagittal right temporal occipital region including the right hippocampal formation, consistent with an acute right PCA territory infarct. Patchy involvement of the right thalamus noted. No associated hemorrhage or mass effect. Previously identified parenchymal hemorrhage involving the inferior left cerebellum again seen, not significantly changed in size measuring 1.4 x 1.2 x 1.5 cm (series 10, image 5). Mild surrounding vasogenic edema without significant regional mass effect. Associated small volume subarachnoid hemorrhage within the adjacent posterior fossa, better appreciated on prior CT. No appreciable underlying mass lesion, vascular abnormality, or abnormal enhancement, although evaluation degraded by motion. No other evidence for acute or subacute infarct. Gray-white matter differentiation otherwise maintained. No encephalomalacia to suggest chronic cortical infarction elsewhere within the brain. No other  evidence for acute or chronic intracranial hemorrhage. No mass lesion, midline shift or mass effect. No hydrocephalus or extra-axial fluid collection. Pituitary gland  grossly within normal limits. Midline structures intact. No other abnormal enhancement. Vascular: Major intracranial vascular flow voids are maintained. Skull and upper cervical spine: Craniocervical junction grossly within normal limits. Bone marrow signal intensity within normal limits. No scalp soft tissue abnormality. Sinuses/Orbits: Globes and orbital soft tissues within normal limits. Paranasal sinuses are largely clear. No mastoid effusion. Inner ear structures grossly normal. Other: None. IMPRESSION: 1. Technically limited exam due to extensive motion artifact. 2. No significant interval change in size of small intraparenchymal hemorrhage involving the inferior left cerebellum. Surrounding mild vasogenic edema without significant regional mass effect. Associated small volume subarachnoid hemorrhage better evaluated on prior CT. No appreciable underlying mass lesion or other abnormality identified. 3. 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 4. Underlying moderately advanced cerebral atrophy with chronic microvascular ischemic disease, with multiple remote lacunar infarcts about the deep gray nuclei bilaterally. Electronically Signed   By: Rise Mu M.D.   On: 08/11/2019 00:30   DG Chest Port 1 View  Result Date: 08/14/2019 CLINICAL DATA:  Acute respiratory distress EXAM: PORTABLE CHEST 1 VIEW COMPARISON:  08/10/2019 FINDINGS: Diffuse interstitial opacity which is new from before. There is asymmetry to the left at the apex which could be related to underlying emphysema. There may be pleural fluid given the hazy appearance at the left lung base. Normal heart size and stable mediastinal contours. No evidence of air leak IMPRESSION: Bilateral interstitial opacity, likely edema superimposed on the patient's emphysema. Electronically Signed   By: Marnee Spring M.D.   On: 08/14/2019 04:17   DG Chest Port 1 View  Result Date:  08/10/2019 CLINICAL DATA:  COPD. EXAM: PORTABLE CHEST 1 VIEW COMPARISON:  Radiograph earlier this day at [facility] FINDINGS: The cardiomediastinal contours are normal. Aortic atherosclerosis. Chronic interstitial coarsening and emphysema. Pulmonary vasculature is normal. No consolidation, pleural effusion, or pneumothorax. No acute osseous abnormalities are seen. IMPRESSION: 1. No acute abnormality. 2. Chronic interstitial coarsening and emphysema. Aortic Atherosclerosis (ICD10-I70.0). Electronically Signed   By: Narda Rutherford M.D.   On: 08/10/2019 23:31   DG Swallowing Func-Speech Pathology  Result Date: 08/12/2019 Objective Swallowing Evaluation: Type of Study: MBS-Modified Barium Swallow Study  Patient Details Name: SHALIE SCHREMP MRN: [id number] Date of Birth: 09-10-1945 Today's Date: 08/12/2019 Time: SLP Start Time (ACUTE ONLY): 1345 -SLP Stop Time (ACUTE ONLY): 1407 SLP Time Calculation (min) (ACUTE ONLY): 22 min Past Medical History: Past Medical History: Diagnosis Date . COPD (chronic obstructive pulmonary disease) (HCC)  . High cholesterol  . On home oxygen therapy   "2L; all the time" (04/06/2016) . Pneumonia   "when I was a kid"  . Urinary hesitancy  Past Surgical History: Past Surgical History: Procedure Laterality Date . ABDOMINAL AORTOGRAM N/A 04/08/2016  Procedure: Abdominal Aortogram;  Surgeon: Sherren Kerns, MD;  Location: [facility] INVASIVE CV LAB;  Service: Cardiovascular;  Laterality: N/A; . CARDIAC CATHETERIZATION N/A 11/28/2014  Procedure: Right/Left Heart Cath and Coronary Angiography;  Surgeon: [name], MD;  Location: [facility] INVASIVE CV LAB;  Service: Cardiovascular;  Laterality: N/A; . CARDIAC CATHETERIZATION  08/2006  Hattie Perch 08/25/2006 . DRESSING CHANGE UNDER ANESTHESIA Left 04/12/2016  Procedure: DRESSING CHANGE LEFT HIP AND LEFT HEEL;  Surgeon: Fransisco Hertz, MD;  Location: [facility] OR;  Service: Vascular;  Laterality: Left; . FEMORAL-POPLITEAL BYPASS GRAFT Left 04/12/2016  Procedure: LEFT  [facility]  BYPASS GRAFT;  Surgeon: Fransisco Hertz, MD;  Location: [facility] OR;  Service: Vascular;  Laterality: Left; . JOINT REPLACEMENT   . LOWER EXTREMITY ANGIOGRAPHY Bilateral 04/08/2016  Procedure: Lower Extremity Angiography;  Surgeon: Sherren Kerns, MD;  Location: [facility] INVASIVE CV LAB;  Service: Cardiovascular;  Laterality: Bilateral; . PERIPHERAL VASCULAR INTERVENTION Bilateral 04/08/2016  Procedure: Peripheral Vascular Intervention;  Surgeon: Sherren Kerns, MD;  Location: [facility] INVASIVE CV LAB;  Service: Cardiovascular;  Laterality: Bilateral;  common iliac . TOTAL HIP ARTHROPLASTY Left 01/29/2016 . VEIN HARVEST Left 04/12/2016  Procedure: LEFT GREATER SAPHENOUS VEIN HARVEST;  Surgeon: Fransisco Hertz, MD;  Location: [facility] OR;  Service: Vascular;  Laterality: Left; HPI: 74 y.o. female past medical history of advanced COPD, hypercholesterolemia, atrial fibrillation not on anticoagulation, presents to the emergency room as a transfer from [facility] for evaluation of cerebellar bleed. CT head demonstrates acute hemorrhage within the paramedian inferior left cerebellar hemisphere with adjacent small volume acute subarachnoid hemorrhage-likely extension of some subarachnoid bleed into the fourth ventricle.  Subjective: upright, cooperative, needs cues Assessment / Plan / Recommendation CHL IP CLINICAL IMPRESSIONS 08/12/2019 Clinical Impression Pt has a mild oropharyngeal dysphagia due to impaired coordination, timing, and oral control. She does not fully masticate solid foods, although she does clear the pieces of food that aren't chewed. There is reduced posterior transit and decreased bolus cohesion with varying amounts of mild-moderate lingual residue across different boluses. She spontaneously pushes oral residue back to her pharynx but then it stays int he valleculae without triggering a second swallow. Boluses also spill back into the pharynx as she is trying to perform her initial swallow. Impaired  timing as her pyriform sinuses begin to fill with thin liquids results in aspiration with inconsistent sensation. This is not improved with cup vs straw sips or with use of chin tuck. Recommend Dys 3 diet and nectar thick liquids.  SLP Visit Diagnosis Dysphagia, oropharyngeal phase (R13.12) Attention and concentration deficit following -- Frontal lobe and executive function deficit following -- Impact on safety and function Mild aspiration risk   CHL IP TREATMENT RECOMMENDATION 08/12/2019 Treatment Recommendations Therapy as outlined in treatment plan below   Prognosis 08/12/2019 Prognosis for Safe Diet Advancement Good Barriers to Reach Goals Cognitive deficits Barriers/Prognosis Comment -- CHL IP DIET RECOMMENDATION 08/12/2019 SLP Diet Recommendations Dysphagia 3 (Mech soft) solids;Nectar thick liquid Liquid Administration via Cup;Straw Medication Administration Whole meds with puree Compensations Slow rate;Small sips/bites Postural Changes Seated upright at 90 degrees   CHL IP OTHER RECOMMENDATIONS 08/12/2019 Recommended Consults -- Oral Care Recommendations Oral care BID Other Recommendations Order thickener from pharmacy;Prohibited food (jello, ice cream, thin soups);Remove water pitcher   CHL IP FOLLOW UP RECOMMENDATIONS 08/12/2019 Follow up Recommendations Inpatient Rehab   CHL IP FREQUENCY AND DURATION 08/12/2019 Speech Therapy Frequency (ACUTE ONLY) min 2x/week Treatment Duration 2 weeks      CHL IP ORAL PHASE 08/12/2019 Oral Phase Impaired Oral - Pudding Teaspoon -- Oral - Pudding Cup -- Oral - Honey Teaspoon -- Oral - Honey Cup -- Oral - Nectar Teaspoon -- Oral - Nectar Cup Reduced posterior propulsion;Lingual/palatal residue;Delayed oral transit;Decreased bolus cohesion Oral - Nectar Straw Reduced posterior propulsion;Lingual/palatal residue;Delayed oral transit;Decreased bolus cohesion Oral - Thin Teaspoon -- Oral - Thin Cup Reduced posterior propulsion;Lingual/palatal residue;Delayed oral transit;Decreased bolus  cohesion;Premature spillage Oral - Thin Straw Reduced posterior propulsion;Lingual/palatal residue;Delayed oral transit;Decreased bolus cohesion;Premature spillage Oral - Puree Reduced posterior propulsion;Lingual/palatal residue;Delayed oral transit;Decreased bolus cohesion Oral - Mech  Soft Reduced posterior propulsion;Lingual/palatal residue;Delayed oral transit;Decreased bolus cohesion;Impaired mastication Oral - Regular -- Oral - Multi-Consistency -- Oral - Pill -- Oral Phase - Comment --  CHL IP PHARYNGEAL PHASE 08/12/2019 Pharyngeal Phase Impaired Pharyngeal- Pudding Teaspoon -- Pharyngeal -- Pharyngeal- Pudding Cup -- Pharyngeal -- Pharyngeal- Honey Teaspoon -- Pharyngeal -- Pharyngeal- Honey Cup -- Pharyngeal -- Pharyngeal- Nectar Teaspoon -- Pharyngeal -- Pharyngeal- Nectar Cup Delayed swallow initiation-vallecula Pharyngeal -- Pharyngeal- Nectar Straw Delayed swallow initiation-vallecula Pharyngeal -- Pharyngeal- Thin Teaspoon -- Pharyngeal -- Pharyngeal- Thin Cup Delayed swallow initiation-pyriform sinuses;Penetration/Aspiration before swallow Pharyngeal Material enters airway, passes BELOW cords without attempt by patient to eject out (silent aspiration);Material enters airway, passes BELOW cords and not ejected out despite cough attempt by patient Pharyngeal- Thin Straw Delayed swallow initiation-pyriform sinuses;Penetration/Aspiration before swallow Pharyngeal Material enters airway, passes BELOW cords without attempt by patient to eject out (silent aspiration);Material enters airway, passes BELOW cords and not ejected out despite cough attempt by patient Pharyngeal- Puree Delayed swallow initiation-vallecula Pharyngeal -- Pharyngeal- Mechanical Soft Delayed swallow initiation-vallecula Pharyngeal -- Pharyngeal- Regular -- Pharyngeal -- Pharyngeal- Multi-consistency -- Pharyngeal -- Pharyngeal- Pill -- Pharyngeal -- Pharyngeal Comment --  CHL IP CERVICAL ESOPHAGEAL PHASE 08/12/2019 Cervical Esophageal  Phase WFL Pudding Teaspoon -- Pudding Cup -- Honey Teaspoon -- Honey Cup -- Nectar Teaspoon -- Nectar Cup -- Nectar Straw -- Thin Teaspoon -- Thin Cup -- Thin Straw -- Puree -- Mechanical Soft -- Regular -- Multi-consistency -- Pill -- Cervical Esophageal Comment -- Mahala Menghini., M.A. CCC-SLP Acute Rehabilitation Services Pager [phone] Office [phone] 08/12/2019, 3:02 PM              ECHOCARDIOGRAM COMPLETE  Result Date: 08/13/2019    ECHOCARDIOGRAM REPORT   Patient Name:   KAMEELAH MINISH [facility] Date of Exam: 08/11/2019 Medical Rec #:  [id number]      Height:       64.0 in Accession #:    [id number]     Weight:       120.0 lb Date of Birth:  [birth year]     BSA:          1.575 m Patient Age:    73 years       BP:           114/98 mmHg Patient Gender: F              HR:           85 bpm. Exam Location:  Inpatient Procedure: 2D Echo, Cardiac Doppler and Color Doppler Indications:     Stroke  History:         Patient has prior history of Echocardiogram examinations, most                  recent 04/07/2016. CHF, COPD; Risk Factors:Hypertension and                  Former Smoker. DOE.  Sonographer:     Ross Ludwig RDCS (AE) Referring Phys:  [id number] ASHISH ARORA Diagnosing Phys: [name] MD IMPRESSIONS  1. Left ventricular ejection fraction, by estimation, is 55 to 60%. The left ventricle has normal function. The left ventricle has no regional wall motion abnormalities. There is mild concentric left ventricular hypertrophy of the anterior and posterior  segments. Left ventricular diastolic parameters are consistent with Grade I diastolic dysfunction (impaired relaxation).  2. Right ventricular systolic function is normal. The right ventricular size is normal. There is moderately elevated pulmonary artery systolic pressure.  3. Left atrial size was mildly dilated.  4. The mitral valve is degenerative. Mild mitral valve regurgitation.  5. The aortic valve is tricuspid. Aortic valve regurgitation is not visualized. Mild to  moderate aortic valve sclerosis/calcification is present, without any evidence of aortic stenosis.  6. There is Moderate (Grade III) atheroma plaque involving the ascending aorta.  7. The inferior vena cava is normal in size with <50% respiratory variability, suggesting right atrial pressure of 8 mmHg. FINDINGS  Left Ventricle: Left ventricular ejection fraction, by estimation, is 55 to 60%. The left ventricle has normal function. The left ventricle has no regional wall motion abnormalities. The left ventricular internal cavity size was normal in size. There is  mild concentric left ventricular hypertrophy of the anterior and posterior segments. Left ventricular diastolic parameters are consistent with Grade I diastolic dysfunction (impaired relaxation). Right Ventricle: The right ventricular size is normal. No increase in right ventricular wall thickness. Right ventricular systolic function is normal. There is moderately elevated pulmonary artery systolic pressure. The tricuspid regurgitant velocity is 3.31 m/s, and with an assumed right atrial pressure of 3 mmHg, the estimated right ventricular systolic pressure is 46.8 mmHg. Left Atrium: Left atrial size was mildly dilated. Right Atrium: Right atrial size was normal in size. Pericardium: There is no evidence of pericardial effusion. Mitral Valve: The mitral valve is degenerative in appearance. There is mild calcification of the mitral valve leaflet(s). Mild to moderate mitral annular calcification. Mild mitral valve regurgitation. MV peak gradient, 7.0 mmHg. The mean mitral valve gradient is 2.0 mmHg. Tricuspid Valve: The tricuspid valve is normal in structure. Tricuspid valve regurgitation is mild. Aortic Valve: The aortic valve is tricuspid. . There is mild thickening and mild calcification of the aortic valve. Aortic valve regurgitation is not visualized. Mild to moderate aortic valve sclerosis/calcification is present, without any evidence of aortic stenosis.  Mild aortic valve annular calcification. There is mild thickening of the aortic valve. There is mild calcification of the aortic valve. Aortic valve mean gradient measures 4.0 mmHg. Aortic valve peak gradient measures 7.2 mmHg. Aortic valve area, by VTI measures 1.98 cm. Pulmonic Valve: The pulmonic valve was not well visualized. Pulmonic valve regurgitation is not visualized. Aorta: The aortic root is normal in size and structure. There is moderate (Grade III) atheroma plaque involving the ascending aorta. Venous: The inferior vena cava is normal in size with less than 50% respiratory variability, suggesting right atrial pressure of 8 mmHg. IAS/Shunts: The interatrial septum was not assessed.  LEFT VENTRICLE PLAX 2D LVIDd:         3.80 cm  Diastology LVIDs:         2.80 cm  LV e' lateral:   5.55 cm/s LV PW:         1.30 cm  LV E/e' lateral: 16.1 LV IVS:        1.30 cm  LV e' medial:    4.90 cm/s LVOT diam:     1.90 cm  LV E/e' medial:  18.2 LV SV:         54 LV SV Index:   34 LVOT Area:     2.84 cm  RIGHT VENTRICLE             IVC RV Basal diam:  2.60 cm     IVC diam: 1.40 cm RV S prime:     11.90 cm/s TAPSE (M-mode): 2.1 cm LEFT ATRIUM             Index  RIGHT ATRIUM           Index LA diam:        3.10 cm 1.97 cm/m  RA Area:     11.80 cm LA Vol (A2C):   38.6 ml 24.51 ml/m RA Volume:   24.90 ml  15.81 ml/m LA Vol (A4C):   31.2 ml 19.81 ml/m LA Biplane Vol: 37.6 ml 23.88 ml/m  AORTIC VALVE AV Area (Vmax):    2.62 cm AV Area (Vmean):   2.06 cm AV Area (VTI):     1.98 cm AV Vmax:           134.00 cm/s AV Vmean:          98.800 cm/s AV VTI:            0.273 m AV Peak Grad:      7.2 mmHg AV Mean Grad:      4.0 mmHg LVOT Vmax:         124.00 cm/s LVOT Vmean:        71.900 cm/s LVOT VTI:          0.191 m LVOT/AV VTI ratio: 0.70  AORTA Ao Root diam: 2.90 cm MITRAL VALVE                TRICUSPID VALVE MV Area (PHT): 4.63 cm     TR Peak grad:   43.8 mmHg MV Peak grad:  7.0 mmHg     TR Vmax:        331.00  cm/s MV Mean grad:  2.0 mmHg MV Vmax:       1.32 m/s     SHUNTS MV Vmean:      70.3 cm/s    Systemic VTI:  0.19 m MV Decel Time: 164 msec     Systemic Diam: 1.90 cm MV E velocity: 89.20 cm/s MV A velocity: 129.00 cm/s MV E/A ratio:  0.69 Orpah CobbAjay Kadakia MD Electronically signed by Orpah CobbAjay Kadakia MD Signature Date/Time: 08/13/2019/2:37:55 PM    Final       Discharge Exam: Vitals:   08/19/19 0615 08/19/19 0906  BP: (!) 161/59 (!) 148/69  Pulse:  64  Resp:  (!) 23  Temp:  98 F (36.7 C)  SpO2:  93%   Vitals:   08/18/19 2324 08/19/19 0345 08/19/19 0615 08/19/19 0906  BP: (!) 128/56 (!) 146/62 (!) 161/59 (!) 148/69  Pulse: 72   64  Resp: 18 18  (!) 23  Temp: 99.1 F (37.3 C) 98.2 F (36.8 C)  98 F (36.7 C)  TempSrc: Oral Oral  Oral  SpO2: 93% 90%  93%  Weight:      Height:        General: Pt is alert, awake, not in acute distress Cardiovascular: RRR, S1/S2 +, no rubs, no gallops Respiratory: CTA bilaterally, no wheezing, no rhonchi Abdominal: Soft, NT, ND, bowel sounds + Extremities: no edema, no cyanosis    The results of significant diagnostics from this hospitalization (including imaging, microbiology, ancillary and laboratory) are listed below for reference.     Microbiology: Recent Results (from the past 240 hour(s))  SARS Coronavirus 2 by RT PCR (hospital order, performed in [facility] hospital lab)     Status: None   Collection Time: 08/10/19  9:06 PM  Result Value Ref Range Status   SARS Coronavirus 2 NEGATIVE NEGATIVE Final    Comment: (NOTE) SARS-CoV-2 target nucleic acids are NOT DETECTED.  The SARS-CoV-2 RNA is generally detectable in upper and lower  respiratory specimens during the acute phase of infection. The lowest concentration of SARS-CoV-2 viral copies this assay can detect is 250 copies / mL. A negative result does not preclude SARS-CoV-2 infection and should not be used as the sole basis for treatment or other patient management decisions.  A negative  result may occur with improper specimen collection / handling, submission of specimen other than nasopharyngeal swab, presence of viral mutation(s) within the areas targeted by this assay, and inadequate number of viral copies (<250 copies / mL). A negative result must be combined with clinical observations, patient history, and epidemiological information.  Fact Sheet for Patients:   BoilerBrush.com.cy  Fact Sheet for Healthcare Providers: https://pope.com/  This test is not yet approved or  cleared by the [place] FDA and has been authorized for detection and/or diagnosis of SARS-CoV-2 by FDA under an Emergency Use Authorization (EUA).  This EUA will remain in effect (meaning this test can be used) for the duration of the COVID-19 declaration under Section 564(b)(1) of the Act, 21 U.S.C. section 360bbb-3(b)(1), unless the authorization is terminated or revoked sooner.  Performed at [facility], [address], [place], [place] [address]   MRSA PCR Screening     Status: None   Collection Time: 08/10/19 11:05 PM   Specimen: Nasal Mucosa; Nasopharyngeal  Result Value Ref Range Status   MRSA by PCR NEGATIVE NEGATIVE Final    Comment:        The GeneXpert MRSA Assay (FDA approved for NASAL specimens only), is one component of a comprehensive MRSA colonization surveillance program. It is not intended to diagnose MRSA infection nor to guide or monitor treatment for MRSA infections. Performed at [facility], [address] [place], [place] [address]   Culture, blood (routine x 2)     Status: None   Collection Time: 08/11/19 12:10 AM   Specimen: BLOOD LEFT HAND  Result Value Ref Range Status   Specimen Description BLOOD LEFT HAND  Final   Special Requests   Final    BOTTLES DRAWN AEROBIC AND ANAEROBIC Blood Culture adequate volume   Culture   Final    NO GROWTH 5 DAYS Performed at [facility],  [address] [place], [place] [address]    Report Status 08/16/2019 FINAL  Final  Culture, blood (routine x 2)     Status: None   Collection Time: 08/11/19 12:10 AM   Specimen: BLOOD RIGHT HAND  Result Value Ref Range Status   Specimen Description BLOOD RIGHT HAND  Final   Special Requests   Final    BOTTLES DRAWN AEROBIC ONLY Blood Culture adequate volume   Culture   Final    NO GROWTH 5 DAYS Performed at [facility], [address], [place], [place] [address]    Report Status 08/16/2019 FINAL  Final  Urine Culture     Status: Abnormal   Collection Time: 08/12/19 10:35 AM   Specimen: Urine, Catheterized  Result Value Ref Range Status   Specimen Description URINE, CATHETERIZED  Final   Special Requests   Final    NONE Performed at [facility], [address] [place], [place] [address]    Culture (A)  Final    >=100,000 COLONIES/mL ESCHERICHIA COLI 40,000 COLONIES/mL STREPTOCOCCUS GALLOLYTICUS    Report Status 08/14/2019 FINAL  Final   Organism ID, Bacteria ESCHERICHIA COLI (A)  Final   Organism ID, Bacteria STREPTOCOCCUS GALLOLYTICUS (A)  Final  Susceptibility   Escherichia coli - MIC*    AMPICILLIN 4 SENSITIVE Sensitive     CEFAZOLIN <=4 SENSITIVE Sensitive     CEFTRIAXONE <=0.25 SENSITIVE Sensitive     CIPROFLOXACIN <=0.25 SENSITIVE Sensitive     GENTAMICIN <=1 SENSITIVE Sensitive     IMIPENEM <=0.25 SENSITIVE Sensitive     NITROFURANTOIN <=16 SENSITIVE Sensitive     TRIMETH/SULFA <=20 SENSITIVE Sensitive     AMPICILLIN/SULBACTAM <=2 SENSITIVE Sensitive     PIP/TAZO <=4 SENSITIVE Sensitive     * >=100,000 COLONIES/mL ESCHERICHIA COLI   Streptococcus gallolyticus - MIC*    PENICILLIN <=0.06 SENSITIVE Sensitive     CEFTRIAXONE <=0.12 SENSITIVE Sensitive     ERYTHROMYCIN >=8 RESISTANT Resistant     LEVOFLOXACIN >=16 RESISTANT Resistant     VANCOMYCIN <=0.12 SENSITIVE Sensitive     * 40,000 COLONIES/mL STREPTOCOCCUS GALLOLYTICUS     Labs: BNP (last  3 results) No results for input(s): BNP in the last 8760 hours. Basic Metabolic Panel: Recent Labs  Lab 08/13/19 0602 08/14/19 0524 08/15/19 0820 08/16/19 0413 08/17/19 0406  NA 139 141 142 140 141  K 3.4* 5.1 3.5 3.5 4.3  CL 105 104 106 101 102  CO2 24 24 26 27 28   GLUCOSE 103* 137* 112* 107* 114*  BUN 12 11 11 10 12   CREATININE 0.98 1.09* 0.93 0.98 0.79  CALCIUM 8.0* 8.6* 8.2* 8.5* 8.6*  MG  --  2.0  --  2.2 1.9   Liver Function Tests: Recent Labs  Lab 08/14/19 0524  AST 37  ALT 15  ALKPHOS 88  BILITOT 0.9  PROT 6.8  ALBUMIN 3.0*   No results for input(s): LIPASE, AMYLASE in the last 168 hours. No results for input(s): AMMONIA in the last 168 hours. CBC: Recent Labs  Lab 08/13/19 0602 08/14/19 0524 08/15/19 0820 08/15/19 1632  WBC 7.8 21.8* 9.3 11.1*  NEUTROABS  --  19.3* 7.2  --   HGB 10.6* 12.8 12.2 12.5  HCT 33.4* 40.5 38.4 38.7  MCV 89.3 90.8 90.8 90.4  PLT 210 261 269 275   Cardiac Enzymes: No results for input(s): CKTOTAL, CKMB, CKMBINDEX, TROPONINI in the last 168 hours. BNP: Invalid input(s): POCBNP CBG: Recent Labs  Lab 08/18/19 1151 08/18/19 1640 08/18/19 2129 08/19/19 0614 08/19/19 0911  GLUCAP 107* 146* 135* 110* 126*   D-Dimer No results for input(s): DDIMER in the last 72 hours. Hgb A1c No results for input(s): HGBA1C in the last 72 hours. Lipid Profile No results for input(s): CHOL, HDL, LDLCALC, TRIG, CHOLHDL, LDLDIRECT in the last 72 hours. Thyroid function studies No results for input(s): TSH, T4TOTAL, T3FREE, THYROIDAB in the last 72 hours.  Invalid input(s): FREET3 Anemia work up No results for input(s): VITAMINB12, FOLATE, FERRITIN, TIBC, IRON, RETICCTPCT in the last 72 hours. Urinalysis    Component Value Date/Time   COLORURINE YELLOW 08/10/2019 2137   APPEARANCEUR CLEAR 08/10/2019 2137   LABSPEC 1.020 08/10/2019 2137   PHURINE 7.0 08/10/2019 2137   GLUCOSEU NEGATIVE 08/10/2019 2137   HGBUR TRACE (A) 08/10/2019  2137   BILIRUBINUR NEGATIVE 08/10/2019 2137   KETONESUR NEGATIVE 08/10/2019 2137   PROTEINUR >300 (A) 08/10/2019 2137   NITRITE POSITIVE (A) 08/10/2019 2137   LEUKOCYTESUR NEGATIVE 08/10/2019 2137   Sepsis Labs Invalid input(s): PROCALCITONIN,  WBC,  LACTICIDVEN Microbiology Recent Results (from the past 240 hour(s))  SARS Coronavirus 2 by RT PCR (hospital order, performed in [facility] hospital lab)     Status: None  Collection Time: 08/10/19  9:06 PM  Result Value Ref Range Status   SARS Coronavirus 2 NEGATIVE NEGATIVE Final    Comment: (NOTE) SARS-CoV-2 target nucleic acids are NOT DETECTED.  The SARS-CoV-2 RNA is generally detectable in upper and lower respiratory specimens during the acute phase of infection. The lowest concentration of SARS-CoV-2 viral copies this assay can detect is 250 copies / mL. A negative result does not preclude SARS-CoV-2 infection and should not be used as the sole basis for treatment or other patient management decisions.  A negative result may occur with improper specimen collection / handling, submission of specimen other than nasopharyngeal swab, presence of viral mutation(s) within the areas targeted by this assay, and inadequate number of viral copies (<250 copies / mL). A negative result must be combined with clinical observations, patient history, and epidemiological information.  Fact Sheet for Patients:   BoilerBrush.com.cy  Fact Sheet for Healthcare Providers: https://pope.com/  This test is not yet approved or  cleared by the [place] FDA and has been authorized for detection and/or diagnosis of SARS-CoV-2 by FDA under an Emergency Use Authorization (EUA).  This EUA will remain in effect (meaning this test can be used) for the duration of the COVID-19 declaration under Section 564(b)(1) of the Act, 21 U.S.C. section 360bbb-3(b)(1), unless the authorization is terminated  or revoked sooner.  Performed at [facility], [address] [place], [place] [address]   MRSA PCR Screening     Status: None   Collection Time: 08/10/19 11:05 PM   Specimen: Nasal Mucosa; Nasopharyngeal  Result Value Ref Range Status   MRSA by PCR NEGATIVE NEGATIVE Final    Comment:        The GeneXpert MRSA Assay (FDA approved for NASAL specimens only), is one component of a comprehensive MRSA colonization surveillance program. It is not intended to diagnose MRSA infection nor to guide or monitor treatment for MRSA infections. Performed at [facility], [address] [place], [place] [address]   Culture, blood (routine x 2)     Status: None   Collection Time: 08/11/19 12:10 AM   Specimen: BLOOD LEFT HAND  Result Value Ref Range Status   Specimen Description BLOOD LEFT HAND  Final   Special Requests   Final    BOTTLES DRAWN AEROBIC AND ANAEROBIC Blood Culture adequate volume   Culture   Final    NO GROWTH 5 DAYS Performed at [facility], [address], [place], [place] [address]    Report Status 08/16/2019 FINAL  Final  Culture, blood (routine x 2)     Status: None   Collection Time: 08/11/19 12:10 AM   Specimen: BLOOD RIGHT HAND  Result Value Ref Range Status   Specimen Description BLOOD RIGHT HAND  Final   Special Requests   Final    BOTTLES DRAWN AEROBIC ONLY Blood Culture adequate volume   Culture   Final    NO GROWTH 5 DAYS Performed at [facility], [address], [place], [place] [address]    Report Status 08/16/2019 FINAL  Final  Urine Culture     Status: Abnormal   Collection Time: 08/12/19 10:35 AM   Specimen: Urine, Catheterized  Result Value Ref Range Status   Specimen Description URINE, CATHETERIZED  Final   Special Requests   Final    NONE Performed at [facility], [address] [place], [place] [address]    Culture (A)  Final    >=  100,000 COLONIES/mL ESCHERICHIA COLI 40,000 COLONIES/mL STREPTOCOCCUS  GALLOLYTICUS    Report Status 08/14/2019 FINAL  Final   Organism ID, Bacteria ESCHERICHIA COLI (A)  Final   Organism ID, Bacteria STREPTOCOCCUS GALLOLYTICUS (A)  Final      Susceptibility   Escherichia coli - MIC*    AMPICILLIN 4 SENSITIVE Sensitive     CEFAZOLIN <=4 SENSITIVE Sensitive     CEFTRIAXONE <=0.25 SENSITIVE Sensitive     CIPROFLOXACIN <=0.25 SENSITIVE Sensitive     GENTAMICIN <=1 SENSITIVE Sensitive     IMIPENEM <=0.25 SENSITIVE Sensitive     NITROFURANTOIN <=16 SENSITIVE Sensitive     TRIMETH/SULFA <=20 SENSITIVE Sensitive     AMPICILLIN/SULBACTAM <=2 SENSITIVE Sensitive     PIP/TAZO <=4 SENSITIVE Sensitive     * >=100,000 COLONIES/mL ESCHERICHIA COLI   Streptococcus gallolyticus - MIC*    PENICILLIN <=0.06 SENSITIVE Sensitive     CEFTRIAXONE <=0.12 SENSITIVE Sensitive     ERYTHROMYCIN >=8 RESISTANT Resistant     LEVOFLOXACIN >=16 RESISTANT Resistant     VANCOMYCIN <=0.12 SENSITIVE Sensitive     * 40,000 COLONIES/mL STREPTOCOCCUS GALLOLYTICUS     Time coordinating discharge: Over 30 minutes  SIGNED:   Hughie Closs, MD  [facility] 08/19/2019, 10:49 AM  If 7PM-7AM, please contact night-coverage [email or web address]

## 2019-08-19 NOTE — Progress Notes (Signed)
Inpatient Rehabilitation-Admissions Coordinator   I have received medical clearance from Dr. Jacqulyn Bath for admit to CIR today. Pt and her son Sharia Reeve notified and have accepted bed offer today. I have reviewed insurance benefits letter and consent forms with her son with her permission. All questions answered. RN and [facility] team notified of plan for admit today.   Please call if questions.   Cheri Rous, OTR/L  Rehab Admissions Coordinator  [phone] 08/19/2019 11:22 AM

## 2019-08-19 NOTE — PMR Pre-admission (Signed)
PMR Admission Coordinator Pre-Admission Assessment  Patient: Caitlin Vasquez is an 74 y.o., female MRN: [id number] DOB: 12-16-1945 Height: 5' 4"  (162.6 cm) Weight: 62.6 kg  Insurance Information HMO:     PPO:      PCP:      IPA:      80/20: yes     OTHER:  PRIMARY: Medicare A and B      Policy#:[id number]       Subscriber: patient CM Name:       Phone#:      Fax#:  Pre-Cert#:       Employer:  Benefits:  Phone #: online     Name: verified eligibility online via [employer] on 08/19/19 Eff. Date: Part A and B effective 01/07/2009     Deduct: $1,484      Out of Pocket Max: NA      Life Max: NA CIR: Covered per Medicare guidelines once yearly deductible has been met      SNF: days 1-20, 100%, days 21-100, 80% Outpatient: 80%     Co-Pay: 20% Home Health: 100%      Co-Pay:  DME: 80%     Co-Pay: 20% Providers: Pt's choice SECONDARY: [employer]       Policy#: [id number]     Phone#: [phone]  Financial Counselor:       Phone#:   The "Data Collection Information Summary" for patients in Inpatient Rehabilitation Facilities with attached "Privacy Act [place] Records" was provided and verbally reviewed with: Family  Emergency Contact Information Contact Information    Name Relation Home Work Mobile   Bellotti,Justin Son [phone]  [phone]   Destina, Mantei   [phone]      Current Medical History  Patient Admitting Diagnosis: Cerebellar hemorrhage complicated by respiratory failure   History of Present Illness:  ESRAA SERES is a 74 y.o. female with history of CAF-no AC, COPD--has been off oxygen for years,  PAD who was admitted via [facility] on 08/10/19 with HA, left inattention and difficulty walking due to  acute left cerebellar hemorrhage with intraventricular extension. 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 MRI brain showed no change in small IPH left cerebellum with surrounding mass effect, small amount SAH and moderate ischemic infarct R-PCA with moderately advanced cerebral atrophy. Dr. Joaquim Nam consulted for input and felt that no surgical intervention needed and recommended MRI w/wo contrast when able to rule out underlying mass. She was started on Cleveprex for BP control and 2 D echo done for work up.  She has had urinary retention requiring I/O caths. Therapy evaluations completed revealing deficits in attention and memory, significant left inattention with ataxia affecting functional status. CIR was recommended due to functional deficits. 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 Pt was admitted to CIR initially on 7/6 but unfortunately required readmit back to acute on 7/7 due to acute respiratory distress/acute on chronic hypoxic respiratory failure and hypertensive emergency. Her respiratory status has improved after Lasix, nitropaste and she is currently on 3L . Since return to acute a head CT showed interval evolution of large right PCA territory infarct. Cardiology consulted due to elevated HS-troponin 1; recommendations were to add HCTZ and potassium for BP and pulmonary edema. Pt was not deemed to be a candidate for cardiac interventions at this  time. On 7/8 pt showed 20 beat V-tach with recommendation for IV magnesium was given. VS stable and BP improved. Therapy re-evaluated since return to acute with recommendations for CIR. Pt is to admit to CIR on 7/12.   Complete NIHSS TOTAL: 13  Patient's medical record from [facility] has been reviewed by the rehabilitation admission coordinator and physician.  Past Medical History  Past Medical History:  Diagnosis Date  . COPD (chronic obstructive pulmonary disease) ([place])   . High cholesterol   . On home oxygen therapy    "2L;  all the time" (04/06/2016)  . Pneumonia    "when I was a kid"   . Urinary hesitancy     Family History   family history includes Heart attack in her father.  Prior Rehab/Hospitalizations Has the patient had prior rehab or hospitalizations prior to admission? Yes  Has the patient had major surgery during 100 days prior to admission? No   Current Medications  Current Facility-Administered Medications:  .  acetaminophen (TYLENOL) tablet 650 mg, 650 mg, Oral, Q6H PRN, 650 mg at 08/17/19 2132 **OR** acetaminophen (TYLENOL) suppository 650 mg, 650 mg, Rectal, Q6H PRN, Karmen Bongo, MD .  albuterol (PROVENTIL) (2.5 MG/3ML) 0.083% nebulizer solution 3 mL, 3 mL, Inhalation, Q6H PRN, Karmen Bongo, MD .  amLODipine (NORVASC) tablet 5 mg, 5 mg, Oral, Daily, Dixie Dials, MD, 5 mg at 08/19/19 0959 .  atorvastatin (LIPITOR) tablet 80 mg, 80 mg, Oral, Daily, Karmen Bongo, MD, 80 mg at 08/19/19 1002 .  bisacodyl (DULCOLAX) EC tablet 5 mg, 5 mg, Oral, Daily PRN, Karmen Bongo, MD .  carvedilol (COREG) tablet 12.5 mg, 12.5 mg, Oral, BID WC, Karmen Bongo, MD, 12.5 mg at 08/19/19 0615 .  Chlorhexidine Gluconate Cloth 2 % PADS 6 each, 6 each, Topical, Daily, Karmen Bongo, MD, 6 each at 08/19/19 0959 .  hydrALAZINE (APRESOLINE) injection 10 mg, 10 mg, Intravenous, Q6H PRN, Pahwani, Ravi, MD .  hydrochlorothiazide (MICROZIDE) capsule 12.5 mg, 12.5 mg, Oral, Daily, Dixie Dials, MD, 12.5 mg at 08/19/19 0958 .  HYDROcodone-acetaminophen (NORCO/VICODIN) 5-325 MG per tablet 1-2 tablet, 1-2 tablet, Oral, Q4H PRN, Karmen Bongo, MD, 2 tablet at 08/18/19 1941 .  insulin aspart (novoLOG) injection 0-9 Units, 0-9 Units, Subcutaneous, TID WC, Karmen Bongo, MD, 1 Units at 08/18/19 1647 .  labetalol (NORMODYNE) injection 10 mg, 10 mg, Intravenous, Q2H PRN, Shelly Coss, MD, 10 mg at 08/17/19 1205 .  lisinopril (ZESTRIL) tablet 10 mg, 10 mg, Oral, Daily, Pahwani, Ravi, MD, 10 mg at 08/19/19  0958 .  morphine 2 MG/ML injection 2 mg, 2 mg, Intravenous, Q2H PRN, Karmen Bongo, MD, 2 mg at 08/14/19 1512 .  ondansetron (ZOFRAN) tablet 4 mg, 4 mg, Oral, Q6H PRN, 4 mg at 08/17/19 1239 **OR** ondansetron (ZOFRAN) injection 4 mg, 4 mg, Intravenous, Q6H PRN, Karmen Bongo, MD, 4 mg at 08/19/19 0522 .  polyethylene glycol (MIRALAX / GLYCOLAX) packet 17 g, 17 g, Oral, Daily PRN, Karmen Bongo, MD .  potassium chloride (KLOR-CON) CR tablet 10 mEq, 10 mEq, Oral, BID, Dixie Dials, MD, 10 mEq at 08/19/19 0958 .  Resource ThickenUp Clear 120 g, 1 Can, Oral, PRN, Karmen Bongo, MD .  senna-docusate (Senokot-S) tablet 1 tablet, 1 tablet, Oral, BID, Karmen Bongo, MD, 1 tablet at 08/19/19 0959 .  zolpidem (AMBIEN) tablet 5 mg, 5 mg, Oral, QHS PRN, Karmen Bongo, MD, 5 mg at 08/18/19 1941  Patients Current Diet:  Diet Order  DIET DYS 3 Room service appropriate? Yes with Assist; Fluid consistency: Nectar Thick  Diet effective now                 Precautions / Restrictions Precautions Precautions: Fall Precaution Comments: supplemental O2, L sided inattention, L visual field deficits Restrictions Weight Bearing Restrictions: No LLE Weight Bearing: Non weight bearing   Has the patient had 2 or more falls or a fall with injury in the past year? No  Prior Activity Level Limited Community (1-2x/wk): was not working or driivng PTA but Independent PTA. using cane for household mobility and had an aide assist with bathing (1x/week?). Managed medications independently   Prior Functional Level Self Care: Did the patient need help bathing, dressing, using the toilet or eating? Independent  Indoor Mobility: Did the patient need assistance with walking from room to room (with or without device)? Independent  Stairs: Did the patient need assistance with internal or external stairs (with or without device)? Independent  Functional Cognition: Did the patient need help planning  regular tasks such as shopping or remembering to take medications? Independent  Home Assistive Devices / Equipment Home Assistive Devices/Equipment: Other (Comment) (Need eval from PT and family needs) Home Equipment: Cane - single point  Prior Device Use: Indicate devices/aids used by the patient prior to current illness, exacerbation or injury? cane  Current Functional Level Cognition  Arousal/Alertness: Awake/alert Overall Cognitive Status: Impaired/Different from baseline Current Attention Level: Sustained Orientation Level: Oriented X4 Following Commands: Follows one step commands consistently, Follows multi-step commands inconsistently, Follows one step commands with increased time Safety/Judgement: Decreased awareness of safety, Decreased awareness of deficits General Comments: poor awareness of deficits or ability to correct balance/posture, impaired sequencing Attention: Focused, Sustained Focused Attention: Appears intact Sustained Attention: Impaired Sustained Attention Impairment: Verbal complex Memory: Impaired Memory Impairment: Storage deficit, Retrieval deficit, Decreased recall of new information (Immediate: 5/5; delayed: 3/5) Awareness: Impaired Awareness Impairment: Intellectual impairment Problem Solving: Impaired Problem Solving Impairment: Verbal complex Executive Function: Organizing, Sequencing Sequencing: Impaired Sequencing Impairment: Verbal complex (clock drawing: 0/4) Organizing: Impaired Organizing Impairment: Verbal complex (Backward digit span: 1/3)    Extremity Assessment (includes Sensation/Coordination)  Upper Extremity Assessment: Generalized weakness, RUE deficits/detail, LUE deficits/detail RUE Deficits / Details: less impacted than L side, but with impaired gross motor coordination, 3+/5 strength RUE Sensation: WNL RUE Coordination: decreased gross motor, decreased fine motor LUE Deficits / Details: Severely impaired coordination (gross &  fine motor), insensate LUE Sensation: decreased light touch, decreased proprioception LUE Coordination: decreased fine motor, decreased gross motor  Lower Extremity Assessment: RLE deficits/detail, LLE deficits/detail RLE Deficits / Details: mild incoordination, +rebound when executing LE AROM. ~3/5 for MMT LE, not formally tested given incoordination LLE Deficits / Details: max incoordination and ataxic movement, + flexor synergy during mobility esp WB. ~3/5 for MMT, not formally tested given incoordination and + rebound during LE AROM LLE Sensation: decreased light touch, decreased proprioception LLE Coordination: decreased gross motor, decreased fine motor    ADLs  Overall ADL's : Needs assistance/impaired Eating/Feeding: Moderate assistance, Bed level Grooming: Minimal assistance, Brushing hair, Sitting Grooming Details (indicate cue type and reason): Min A for brushing hair sitting EOB. Assistance needed to maintain sitting balance EOB. Pt able to brush L side of hair without verbal cues needed. Min A needed for throughness due to tangled hair Upper Body Bathing: Maximal assistance, Bed level Lower Body Bathing: Total assistance, Bed level Upper Body Dressing : Maximal assistance, Bed level Upper Body Dressing Details (indicate cue  type and reason): Max A to don hospital gown bed level, high distractability and decreased coordination  Lower Body Dressing: Total assistance, Bed level Toilet Transfer: Total assistance, +2 for physical assistance Toileting- Clothing Manipulation and Hygiene: Total assistance, Bed level General ADL Comments: Pt requires extensive assist for all ADLs due to expansion of CVA with impairments in coordination, strength, cognition, vision, and endurance (secondary to inclusion of COPD diagnosis).     Mobility  Overal bed mobility: Needs Assistance Bed Mobility: Rolling, Sidelying to Sit, Sit to Sidelying Rolling: Mod assist Sidelying to sit: Max assist Supine  to sit: Max assist, +2 for physical assistance Sit to supine: Total assist, +2 for physical assistance Sit to sidelying: Max assist General bed mobility comments: cues for rolling and technique to progress LEs off of bed and push trunk up to sitting; now leaning to left but not really pushing; MinA to maintain balance with cues to promote postural alignment    Transfers  Overall transfer level: Needs assistance Equipment used: Ambulation equipment used Transfer via Lift Equipment: Stedy Transfers: Sit to/from Stand Sit to Stand: Max assist, +2 physical assistance General transfer comment: able to briefly stand in stedy for placement of stedy seat pads but unable to manitain standing for significant amount of time    Ambulation / Gait / Stairs / Emergency planning/management officer  Ambulation/Gait General Gait Details: unable- fatigue    Posture / Balance Dynamic Sitting Balance Sitting balance - Comments: left lean, absent postural correction skills, ModA/min uces to reach to R to assist in correcting balance Balance Overall balance assessment: Needs assistance Sitting-balance support: Bilateral upper extremity supported Sitting balance-Leahy Scale: Poor Sitting balance - Comments: left lean, absent postural correction skills, ModA/min uces to reach to R to assist in correcting balance Postural control: Left lateral lean Standing balance support: Bilateral upper extremity supported Standing balance-Leahy Scale: Zero Standing balance comment: reliant on external support of stedy/therapists    Special needs/care consideration Oxygen: yes, 3L/Min nasal cannular   Skin: ecchymosis to right, left arm, MASD to bilateral buttocks, skin tear to Left arm, wound (open or dehisced) to arm anterior, left, lower, proximal; wound (open or dehisced) to left rib, lower under breast rash; wound (open or dehisced) to buttocks mid unstageable.    and Designated visitor: Larkin Ina and Merrily Pew (sons)   Previous Recruitment consultant (from acute therapy documentation) Living Arrangements: Children  Lives With: Son Available Help at Discharge: Family, Available 24 hours/day (lives with son who can provide physical assist, also has other son and daughter-in-law as needed) Type of Home: House Home Layout: Two level, Able to live on main level with bedroom/bathroom Home Access: Stairs to enter Entrance Stairs-Rails: Right, Left Entrance Stairs-Number of Steps: 3 Bathroom Shower/Tub: Chiropodist: Rennert: Yes  Discharge Living Setting Plans for Discharge Living Setting: Patient's home, Lives with (comment) (lives with son Merrily Pew ) Type of Home at Discharge: House Discharge Home Layout: Two level, Able to live on main level with bedroom/bathroom Alternate Level Stairs-Rails: None (NA) Alternate Level Stairs-Number of Steps: NA Discharge Home Access: Stairs to enter Entrance Stairs-Rails: Can reach both Entrance Stairs-Number of Steps: 3 Discharge Bathroom Shower/Tub: Other (comment) (pt sponge bathes) Discharge Bathroom Toilet: Standard Discharge Bathroom Accessibility: Yes How Accessible: Accessible via walker Does the patient have any problems obtaining your medications?: No  Social/Family/Support Systems Patient Roles: Other (Comment) (lives with son Merrily Pew) Sport and exercise psychologist Information: Josh: [phone]; other son Larkin Ina: [phone] Anticipated Caregiver: Merrily Pew and Larkin Ina  Anticipated  Caregiver's Contact Information: see above Ability/Limitations of Caregiver: Mod A per Josh Caregiver Availability: 24/7 Discharge Plan Discussed with Primary Caregiver: Yes (pt and Josh) Is Caregiver In Agreement with Plan?: Yes Does Caregiver/Family have Issues with Lodging/Transportation while Pt is in Rehab?: No  Goals Patient/Family Goal for Rehab: PT/OT: Mod A; SLP: Supervision Expected length of stay: 16-20 days Cultural Considerations: NA Pt/Family Agrees to Admission and  willing to participate: Yes Program Orientation Provided & Reviewed with Pt/Caregiver Including Roles  & Responsibilities: Yes  Barriers to Discharge: Home environment access/layout, Incontinence  Barriers to Discharge Comments: stairs to enter home; will need to be a one person assist to return home safely.   Decrease burden of Care through IP rehab admission: NA  Possible need for SNF placement upon discharge: Not anticipated; pt has great family support at DC from her sons who are committed to her care and understand the anticipated DC level of Mod A.   Patient Condition: I have reviewed medical records from [facility], spoken with MD and RN, and patient and son. I met with patient at the bedside for inpatient rehabilitation assessment.  Patient will benefit from ongoing PT, OT and SLP, can actively participate in 3 hours of therapy a day 5 days of the week, and can make measurable gains during the admission.  Patient will also benefit from the coordinated team approach during an Inpatient Acute Rehabilitation admission.  The patient will receive intensive therapy as well as Rehabilitation physician, nursing, social worker, and care management interventions.  Due to bladder management, bowel management, safety, skin/wound care, disease management, medication administration, pain management and patient education the patient requires 24 hour a day rehabilitation nursing.  The patient is currently Max A +2 to total A +2 for bed mobility no tranfers since back to acute yet and Min A to Max A for basic ADLs.  Discharge setting and therapy post discharge at home with home health is anticipated.  Patient has agreed to participate in the Acute Inpatient Rehabilitation Program and will admit today.  Preadmission Screen Completed By:  Raechel Ache, 08/19/2019 11:40 AM ______________________________________________________________________   Discussed status with Dr. Ranell Patrick on 08/19/19 at  10:58AM and received approval for admission today.  Admission Coordinator:  Raechel Ache, OT, time 10:58AM/Date 08/19/19.   Assessment/Plan: Diagnosis: Bilateral cerebral embolic infarctions 1. Does the need for close, 24 hr/day Medical supervision in concert with the patient's rehab needs make it unreasonable for this patient to be served in a less intensive setting? Yes 2. Co-Morbidities requiring supervision/potential complications: Hypertensive emergency, Acute on chronic hypoxic respiratory failure, elevated troponin, COPD. Paroxysmal afib, V tach, UTI, HLD, hyperglycemia, acute urinary retention 3. Due to bladder management, bowel management, safety, skin/wound care, disease management, medication administration, pain management and patient education, does the patient require 24 hr/day rehab nursing? Yes 4. Does the patient require coordinated care of a physician, rehab nurse, PT, OT, and SLP to address physical and functional deficits in the context of the above medical diagnosis(es)? Yes Addressing deficits in the following areas: balance, endurance, locomotion, strength, transferring, bowel/bladder control, bathing, dressing, feeding, grooming, toileting and psychosocial support 5. Can the patient actively participate in an intensive therapy program of at least 3 hrs of therapy 5 days a week? Yes 6. The potential for patient to make measurable gains while on inpatient rehab is excellent 7. Anticipated functional outcomes upon discharge from inpatient rehab: min assist PT, min assist OT, modified independent SLP 8. Estimated rehab length of  stay to reach the above functional goals is: 14-18 days 9. Anticipated discharge destination: Home 10. Overall Rehab/Functional Prognosis: good   MD Signature: Leeroy Cha, MD

## 2019-08-19 NOTE — H&P (Signed)
Physical Medicine and Rehabilitation Admission H&P    CC: Stroke with functional deficits.    HPI: Caitlin Vasquez is a 74 year old female with history of COPD, A fib- no AC, PAD, anxiety d/o who was originally admitted 08/10/19 via [facility] with HA, left inattention and difficulty walking due to acute hemorrhage in paramedian left cerebellar hemisphere with small volume SAH UDS positive for opiates. 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 Dr. Maurice Small consulted for input and felt that no surgical intervention needed but to repeat MRI brain w/ w/o contrast when able due to uncommon location of hemorrhage.   She was started on Cleveprex for BP management and noted to have leucocytosis at admission with WBC-18.2.  She developed fever later that evening due to E coli UTI and treated with IV Rocephin. She also had issues with urinary retention requiring I/O caths. MBS done revealing aspiration of thins and she was started on dysphagia 3, nectar liquids. BP medications were slowly being resumed and she was cleared for intensive rehab program on 07/06. BP continued to be labile and patient reported worsening of headache.  She developed hypoxia early am of 07/08 with stats in 70's due to fluid overload and had improvement with IV diuresis. She was noted to be lethargic and has increase in weakness with decrease in mentation.   CT head was ordered due to concerns of extension and showed evolution of R-PCA infarct involving right parasagittal temporal lobe and right occipital lobe with mild associated mass effect and unchanged inferior L-IPH in left cerebellar hemisphere with small amount of vasogenic edema.  She was transferred to acute floor for workup and monitoring. She was found to have rise in troponin's to 920 and EKG showed  ST changes in lateral leads felt to bed due to NSTEMI. Patient without chest pain and not felt to be a candidate for intervention. HCTZ added to manage pulmonary edema per Dr. Algie Coffer. Electrolyte abnormalities have resolved with IV magnesium as well as addition of Kdur. Dr. Pearlean Brownie felt that patient with expected evolutionary changes and that  Hypoxia with hypertensive emergency accounted for increase in left sided deficits--and to would improve as parameters optimized.    She was started on IV ceftriaxone and transitioned to cefdinir to complete 7 total day antibiotic regimen. Medical issues have been optimized and she was cleared to resume CIR course.  Therapy has been resumed and patient with deficits in awareness, memory, attention and executive function.  Noted to have left lean without ability to self correct and needs min assist to sit EOB. Limited by fatigue and SOB and able to stand in [place] with max assist X 2.   Review of Systems  Constitutional: Negative for chills and fever.  HENT: Negative for hearing loss and tinnitus.   Eyes: Negative for blurred vision.  Respiratory: Positive for cough and shortness of breath.   Cardiovascular: Negative for chest pain and palpitations.  Gastrointestinal: Negative for abdominal pain, heartburn and nausea.  Musculoskeletal: Negative for myalgias.  Skin: Negative for rash.  Neurological: Positive for sensory change, speech change and focal weakness. Negative for dizziness and headaches.  Psychiatric/Behavioral: The patient is nervous/anxious (wants to jump out of her skin) and has insomnia.    Past Medical History:  Diagnosis Date  . COPD (chronic obstructive pulmonary  disease) (HCC)   . High cholesterol   . On home oxygen therapy    "2L; all the time" (04/06/2016)  . Pneumonia    "when I was a kid"   . Urinary hesitancy     Past Surgical History:  Procedure Laterality Date  . ABDOMINAL AORTOGRAM N/A 04/08/2016   Procedure: Abdominal  Aortogram;  Surgeon: Sherren Kerns, MD;  Location: [facility] INVASIVE CV LAB;  Service: Cardiovascular;  Laterality: N/A;  . CARDIAC CATHETERIZATION N/A 11/28/2014   Procedure: Right/Left Heart Cath and Coronary Angiography;  Surgeon: [name], MD;  Location: [facility] INVASIVE CV LAB;  Service: Cardiovascular;  Laterality: N/A;  . CARDIAC CATHETERIZATION  08/2006   Hattie Perch 08/25/2006  . DRESSING CHANGE UNDER ANESTHESIA Left 04/12/2016   Procedure: DRESSING CHANGE LEFT HIP AND LEFT HEEL;  Surgeon: Fransisco Hertz, MD;  Location: [facility] OR;  Service: Vascular;  Laterality: Left;  . FEMORAL-POPLITEAL BYPASS GRAFT Left 04/12/2016   Procedure: LEFT FEMORAL-POPITEAL  BYPASS GRAFT;  Surgeon: Fransisco Hertz, MD;  Location: [facility] OR;  Service: Vascular;  Laterality: Left;  . JOINT REPLACEMENT    . LOWER EXTREMITY ANGIOGRAPHY Bilateral 04/08/2016   Procedure: Lower Extremity Angiography;  Surgeon: Sherren Kerns, MD;  Location: [facility] INVASIVE CV LAB;  Service: Cardiovascular;  Laterality: Bilateral;  . PERIPHERAL VASCULAR INTERVENTION Bilateral 04/08/2016   Procedure: Peripheral Vascular Intervention;  Surgeon: Sherren Kerns, MD;  Location: [facility] INVASIVE CV LAB;  Service: Cardiovascular;  Laterality: Bilateral;  common iliac  . TOTAL HIP ARTHROPLASTY Left 01/29/2016  . VEIN HARVEST Left 04/12/2016   Procedure: LEFT GREATER SAPHENOUS VEIN HARVEST;  Surgeon: Fransisco Hertz, MD;  Location: [facility] OR;  Service: Vascular;  Laterality: Left;    Family History  Problem Relation Age of Onset  . Heart attack Father     Social History:  reports that she quit smoking about 5 years ago. Her smoking use included cigarettes. She has a 55.00 pack-year smoking history. She has never used smokeless tobacco. She reports that she does not drink alcohol and does not use drugs.    Allergies  Allergen Reactions  . Clonidine Derivatives Other (See Comments)    hypotension    Medications Prior to Admission  Medication Sig Dispense Refill  .  acetaminophen (TYLENOL) 500 MG tablet Take 500 mg by mouth 2 (two) times daily as needed for mild pain.    Marland Kitchen albuterol (PROVENTIL HFA;VENTOLIN HFA) 108 (90 BASE) MCG/ACT inhaler Inhale 2 puffs into the lungs every 6 (six) hours as needed for wheezing or shortness of breath.    Marland Kitchen amLODipine (NORVASC) 5 MG tablet Take 1 tablet (5 mg total) by mouth daily.    Marland Kitchen aspirin 81 MG EC tablet Take 1 tablet (81 mg total) by mouth daily. 30 tablet 3  . atorvastatin (LIPITOR) 80 MG tablet Take 1 tablet (80 mg total) by mouth daily.    . carvedilol (COREG) 12.5 MG tablet Take 1 tablet (12.5 mg total) by mouth 2 (two) times daily with a meal. (Patient not taking: Reported on 08/10/2019) 60 tablet 3  . cefTRIAXone 1 g in sodium chloride 0.9 % 100 mL Inject 1 g into the vein daily.    Marland Kitchen enoxaparin (LOVENOX) 40 MG/0.4ML injection Inject 0.4 mLs (40 mg total) into the skin daily. 0 mL   . Maltodextrin-Xanthan Gum (RESOURCE THICKENUP CLEAR) POWD Take 120 g by mouth as needed (nectar thick liquids).    . senna-docusate (SENOKOT-S) 8.6-50 MG tablet Take 1 tablet by  mouth 2 (two) times daily.    . sodium chloride 0.9 % infusion Inject 50 mLs into the vein continuous. 1000 mL 0    Drug Regimen Review  Drug regimen was reviewed and remains appropriate with no significant issues identified  Home: Home Living Family/patient expects to be discharged to:: Private residence Living Arrangements: Children Available Help at Discharge: Family, Available 24 hours/day (lives with son who can provide physical assist, also has other son and daughter-in-law as needed) Type of Home: House Home Access: Stairs to enter Secretary/administrator of Steps: 3 Entrance Stairs-Rails: Right, Left Home Layout: Two level, Able to live on main level with bedroom/bathroom Bathroom Shower/Tub: Engineer, manufacturing systems: Standard Home Equipment: Medical laboratory scientific officer - single point  Lives With: Son   Functional History: Prior Function Level of  Independence: Needs assistance Gait / Transfers Assistance Needed: pt reports ambulating independently for short household distances with use of cane ADL's / Homemaking Assistance Needed: pt reports requiring assistance for all IADLs, takes a sponge bath, performs ADLs independently  Functional Status:  Mobility: Bed Mobility Overal bed mobility: Needs Assistance Bed Mobility: Rolling, Sidelying to Sit, Sit to Sidelying Rolling: Mod assist Sidelying to sit: Max assist Supine to sit: Max assist, +2 for physical assistance Sit to supine: Total assist, +2 for physical assistance Sit to sidelying: Max assist General bed mobility comments: cues for rolling and technique to progress LEs off of bed and push trunk up to sitting; now leaning to left but not really pushing; MinA to maintain balance with cues to promote postural alignment Transfers Overall transfer level: Needs assistance Equipment used: Ambulation equipment used Transfer via Lift Equipment: Stedy Transfers: Sit to/from Stand Sit to Stand: Max assist, +2 physical assistance General transfer comment: able to briefly stand in stedy for placement of stedy seat pads but unable to manitain standing for significant amount of time Ambulation/Gait General Gait Details: unable- fatigue   ADL: ADL Overall ADL's : Needs assistance/impaired Eating/Feeding: Moderate assistance, Bed level Grooming: Minimal assistance, Brushing hair, Sitting Grooming Details (indicate cue type and reason): Min A for brushing hair sitting EOB. Assistance needed to maintain sitting balance EOB. Pt able to brush L side of hair without verbal cues needed. Min A needed for throughness due to tangled hair Upper Body Bathing: Maximal assistance, Bed level Lower Body Bathing: Total assistance, Bed level Upper Body Dressing : Maximal assistance, Bed level Upper Body Dressing Details (indicate cue type and reason): Max A to don hospital gown bed level, high  distractability and decreased coordination  Lower Body Dressing: Total assistance, Bed level Toilet Transfer: Total assistance, +2 for physical assistance Toileting- Clothing Manipulation and Hygiene: Total assistance, Bed level General ADL Comments: Pt requires extensive assist for all ADLs due to expansion of CVA with impairments in coordination, strength, cognition, vision, and endurance (secondary to inclusion of COPD diagnosis).   Cognition: Cognition Overall Cognitive Status: Impaired/Different from baseline Arousal/Alertness: Awake/alert Orientation Level: Oriented X4 Attention: Focused, Sustained Focused Attention: Appears intact Sustained Attention: Impaired Sustained Attention Impairment: Verbal complex Memory: Impaired Memory Impairment: Storage deficit, Retrieval deficit, Decreased recall of new information (Immediate: 5/5; delayed: 3/5) Awareness: Impaired Awareness Impairment: Intellectual impairment Problem Solving: Impaired Problem Solving Impairment: Verbal complex Executive Function: Organizing, Sequencing Sequencing: Impaired Sequencing Impairment: Verbal complex (clock drawing: 0/4) Organizing: Impaired Organizing Impairment: Verbal complex (Backward digit span: 1/3) Cognition Arousal/Alertness: Awake/alert Behavior During Therapy: Flat affect Overall Cognitive Status: Impaired/Different from baseline Area of Impairment: Attention, Following commands, Memory, Safety/judgement, Awareness, Problem solving Current  Attention Level: Sustained Memory: Decreased recall of precautions Following Commands: Follows one step commands consistently, Follows multi-step commands inconsistently, Follows one step commands with increased time Safety/Judgement: Decreased awareness of safety, Decreased awareness of deficits Awareness: Emergent Problem Solving: Slow processing, Decreased initiation, Difficulty sequencing, Requires verbal cues, Requires tactile cues General  Comments: poor awareness of deficits or ability to correct balance/posture, impaired sequencing  Physical Exam: Blood pressure (!) 148/69, pulse 64, temperature 98 F (36.7 C), temperature source Oral, resp. rate (!) 23, height  (1.626 m), weight 62.6 kg, SpO2 93 %. Physical Exam Vitals and nursing note reviewed.  Constitutional:      General: She is not in acute distress.    Appearance: She is underweight.     Interventions: Nasal cannula in place.  Eyes:     Comments: Crusty drainage on both eyelids.  Neck: Supple without JVD or lymphadenopathy Heart: Reg rate and rhythm. No murmurs rubs or gallops Pulmonary:     Breath sounds: Rhonchi present.     Comments: Intermittent congested cough noted.  Musculoskeletal:     Cervical back: No rigidity.  Neurological:     Mental Status: She is oriented to person, place, and time and easily aroused. She is lethargic.     Comments: Needed cues to stay awake--"tired". Left facial weakness with raspy, congested voice. Right gaze preference with left inattention and was unable to move eyes beyond midline. Needed max cues to attend to left and had poor awareness of deficits. Left sided weakness with left sensory deficits.   Psych: Pt's affect is lethargic. Pt is cooperative  Results for orders placed or performed during the hospital encounter of 08/14/19 (from the past 48 hour(s))  Glucose, capillary     Status: Abnormal   Collection Time: 08/17/19 11:52 AM  Result Value Ref Range   Glucose-Capillary 129 (H) 70 - 99 mg/dL    Comment: Glucose reference range applies only to samples taken after fasting for at least 8 hours.  Glucose, capillary     Status: Abnormal   Collection Time: 08/17/19  4:04 PM  Result Value Ref Range   Glucose-Capillary 146 (H) 70 - 99 mg/dL    Comment: Glucose reference range applies only to samples taken after fasting for at least 8 hours.  Glucose, capillary     Status: Abnormal   Collection Time: 08/17/19  9:21 PM   Result Value Ref Range   Glucose-Capillary 141 (H) 70 - 99 mg/dL    Comment: Glucose reference range applies only to samples taken after fasting for at least 8 hours.  Glucose, capillary     Status: Abnormal   Collection Time: 08/18/19  6:36 AM  Result Value Ref Range   Glucose-Capillary 110 (H) 70 - 99 mg/dL    Comment: Glucose reference range applies only to samples taken after fasting for at least 8 hours.  Glucose, capillary     Status: Abnormal   Collection Time: 08/18/19  8:20 AM  Result Value Ref Range   Glucose-Capillary 129 (H) 70 - 99 mg/dL    Comment: Glucose reference range applies only to samples taken after fasting for at least 8 hours.  Glucose, capillary     Status: Abnormal   Collection Time: 08/18/19 11:51 AM  Result Value Ref Range   Glucose-Capillary 107 (H) 70 - 99 mg/dL    Comment: Glucose reference range applies only to samples taken after fasting for at least 8 hours.  Glucose, capillary     Status: Abnormal  Collection Time: 08/18/19  4:40 PM  Result Value Ref Range   Glucose-Capillary 146 (H) 70 - 99 mg/dL    Comment: Glucose reference range applies only to samples taken after fasting for at least 8 hours.  Glucose, capillary     Status: Abnormal   Collection Time: 08/18/19  9:29 PM  Result Value Ref Range   Glucose-Capillary 135 (H) 70 - 99 mg/dL    Comment: Glucose reference range applies only to samples taken after fasting for at least 8 hours.  Glucose, capillary     Status: Abnormal   Collection Time: 08/19/19  6:14 AM  Result Value Ref Range   Glucose-Capillary 110 (H) 70 - 99 mg/dL    Comment: Glucose reference range applies only to samples taken after fasting for at least 8 hours.  Glucose, capillary     Status: Abnormal   Collection Time: 08/19/19  9:11 AM  Result Value Ref Range   Glucose-Capillary 126 (H) 70 - 99 mg/dL    Comment: Glucose reference range applies only to samples taken after fasting for at least 8 hours.   No results  found.  Medical Problem List and Plan: 1.  Impaired mobility and ADLs secondary to   -patient may shower  -ELOS/Goals: 14-18 days  -Admit to CIR 2.  Antithrombotics: -DVT/anticoagulation:  Mechanical: Sequential compression devices, below knee Bilateral lower extremities  -antiplatelet therapy: N/a due to bleed.  3. Pain Management: Back pain resolved with treatment of UTI. Discontinue hydrocodone and will use tramadol or tylenol prn for now.  4. Anxiety disorder/Mood: Per patient/son was using ? Medication for anxiety.   -antipsychotic agents: N/a 5. Neuropsych: This patient is not fully capable of making decisions on her own behalf. 6. Skin/Wound Care:  Routine pressure relief measures.  7. Fluids/Electrolytes/Nutrition: Strict I/O. Check labs in am. 8. NSTEMI with fluid overload: Treated medically with Coreg, Lisinopril and Lipitor.  9. Severe COPD: Encourage pulmonary hygiene--will order flutter. Wean oxygen to off as able as has not use it for past year per son. Albuterol MDI prn. Will order CXR as continues to have SOB with activity.   10. E coli UTI: Has completed 7 day antibiotic course on 07/11.  11. HTN: SBP goal <160. Monitor BP tid--on Norvasc and HCTZ. BP is much better controlled now off Labetalol. 12. Lethargy: More lethargic than when I last saw her. Multifactorial? Received hydrocodone X 2 yesterday and Ambien last night. Will change 13. Urinary retention: Continue monitor voiding with PVR checks/bladder scans 4-6 hours and I/O caht to keep volumes <300 cc.  14. A fib: Not AC candidate due to hemorrhage.  15. Dysphagia: On dysphagia 3 diet with nectar liquids.  16. Tachypnea: RR is up to 23 right now- need to monitor closely.   Jacquelynn Creeamela S Love, PA-C 08/19/2019   I have personally performed a face to face diagnostic evaluation, including, but not limited to relevant history and physical exam findings, of this patient and developed relevant assessment and plan.  Additionally,  I have reviewed and concur with the physician assistant's documentation above.  Sula SodaKrutika Ione Sandusky, MD

## 2019-08-19 NOTE — Progress Notes (Deleted)
[name], MD  Physician  Physical Medicine and Rehabilitation  Consult Note      Signed  Date of Service:  08/12/2019  8:16 AM      Related encounter: ED to Hosp-Admission (Discharged) from 08/10/2019 in [facility] NEURO/TRAUMA/SURGICAL ICU      Signed      Expand All Collapse All  Show:Clear all [x] Manual[x] Template[] Copied  Added by: [x] Love, Evlyn Kanner, PA-C[x] Raulkar, Drema Pry, MD  [] Hover for details          Physical Medicine and Rehabilitation Consult     Reason for Consult: Cerebellar IPH with functional deficits.  Referring Physician: Dr. Pearlean Brownie.      HPI: Caitlin Vasquez is a 74 y.o. 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 She was transferred to 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 MRI brain showed no change in small IPH left cerebellum with surrounding mass effect, small amount SAH and moderate ischemic infarct R-PCA with moderately advanced cerebral atrophy. Dr. Marcia Brash consulted for input and felt that no surgical intervention needed and recommended MRI w/wo contrast when able to rule out underlying mass. She was started on Cleveprex for BP control and 2 D echo done for work up.  She has had urinary retention requiring I/O caths. Therapy evaluations completed revealing deficits in attention and memory, significant left inattention with ataxia affecting functional status. CIR was recommended due to functional deficits. 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      Review of Systems  Constitutional: Negative for chills and fever.  HENT:  Negative for hearing loss and tinnitus.   Eyes: Negative for blurred vision and double vision.  Respiratory: Negative for cough and shortness of breath.   Cardiovascular: Negative for chest pain and palpitations.       Leg cramps that keep her up at nights  Gastrointestinal: Positive for nausea. Negative for constipation, heartburn and vomiting.  Genitourinary: Negative for dysuria and urgency.  Musculoskeletal: Positive for back pain (since admission.). Negative for falls, joint pain and myalgias.  Skin: Negative for rash.  Neurological: Positive for dizziness and weakness. Negative for headaches.  Psychiatric/Behavioral: The patient is not nervous/anxious and does not have insomnia.             Past Medical History:  Diagnosis Date   COPD (chronic obstructive pulmonary disease) (HCC)     High cholesterol     On home oxygen therapy      "2L; all the time" (04/06/2016)   Pneumonia      "when I was a kid"    Urinary hesitancy             Past Surgical History:  Procedure Laterality Date   ABDOMINAL AORTOGRAM N/A 04/08/2016    Procedure: Abdominal Aortogram;  Surgeon: Sherren Kerns, MD;  Location: [facility] INVASIVE CV LAB;  Service: Cardiovascular;  Laterality: N/A;   CARDIAC CATHETERIZATION N/A 11/28/2014    Procedure: Right/Left Heart Cath and Coronary Angiography;  Surgeon: [name], MD;  Location: [facility] INVASIVE CV LAB;  Service: Cardiovascular;  Laterality: N/A;   CARDIAC CATHETERIZATION   08/2006    Hattie Perch 08/25/2006  DRESSING CHANGE UNDER ANESTHESIA Left 04/12/2016    Procedure: DRESSING CHANGE LEFT HIP AND LEFT HEEL;  Surgeon: Fransisco Hertz, MD;  Location: [facility] OR;  Service: Vascular;  Laterality: Left;   FEMORAL-POPLITEAL BYPASS GRAFT Left 04/12/2016    Procedure: LEFT FEMORAL-POPITEAL  BYPASS GRAFT;  Surgeon: Fransisco Hertz, MD;  Location: [facility] OR;  Service: Vascular;  Laterality: Left;   JOINT REPLACEMENT       LOWER EXTREMITY ANGIOGRAPHY Bilateral 04/08/2016    Procedure:  Lower Extremity Angiography;  Surgeon: Sherren Kerns, MD;  Location: [facility] INVASIVE CV LAB;  Service: Cardiovascular;  Laterality: Bilateral;   PERIPHERAL VASCULAR INTERVENTION Bilateral 04/08/2016    Procedure: Peripheral Vascular Intervention;  Surgeon: Sherren Kerns, MD;  Location: [facility] INVASIVE CV LAB;  Service: Cardiovascular;  Laterality: Bilateral;  common iliac   TOTAL HIP ARTHROPLASTY Left 01/29/2016   VEIN HARVEST Left 04/12/2016    Procedure: LEFT GREATER SAPHENOUS VEIN HARVEST;  Surgeon: Fransisco Hertz, MD;  Location: [facility] OR;  Service: Vascular;  Laterality: Left;           Family History  Problem Relation Age of Onset   Heart attack Father        Social History:  Lives with son (who manges home/cooks-does not work).  Sedentary with limited mobility--lives in her recliner. Uses cane for ambulation. She reports that she quit smoking about 5 years ago. Her smoking use included cigarettes. She has a 55.00 pack-year smoking history. She uses E- cigarettes daily. She reports that she does not drink alcohol and does not use drugs.     Allergies  Allergen Reactions   Clonidine Derivatives Other (See Comments)      hypotension            Medications Prior to Admission  Medication Sig Dispense Refill   acetaminophen (TYLENOL) 500 MG tablet Take 500 mg by mouth 2 (two) times daily as needed for mild pain.       albuterol (PROVENTIL HFA;VENTOLIN HFA) 108 (90 BASE) MCG/ACT inhaler Inhale 2 puffs into the lungs every 6 (six) hours as needed for wheezing or shortness of breath.       aspirin EC 81 MG EC tablet Take 1 tablet (81 mg total) by mouth daily. 30 tablet 3   atorvastatin (LIPITOR) 20 MG tablet Take 1 tablet (20 mg total) by mouth 3 (three) times a week. (Patient not taking: Reported on 08/10/2019) 30 tablet 3   carvedilol (COREG) 12.5 MG tablet Take 1 tablet (12.5 mg total) by mouth 2 (two) times daily with a meal. (Patient not taking: Reported on 08/10/2019) 60 tablet 3    clopidogrel (PLAVIX) 75 MG tablet Take 1 tablet (75 mg total) by mouth daily. (Patient not taking: Reported on 08/10/2019) 30 tablet 3   collagenase (SANTYL) ointment Apply topically daily. Apply to left heel daily and cover with dry gauze. Use gauze wrap on top to keep in place. (Patient not taking: Reported on 08/10/2019) 15 g 2   docusate sodium (COLACE) 100 MG capsule Take 1 capsule (100 mg total) by mouth daily. (Patient not taking: Reported on 08/10/2019) 30 capsule 3   feeding supplement, ENSURE ENLIVE, (ENSURE ENLIVE) LIQD Take 237 mLs by mouth 2 (two) times daily between meals. (Patient not taking: Reported on 08/10/2019) 60 Bottle 12   ferrous sulfate 325 (65 FE) MG tablet Take 1 tablet (325 mg total) by mouth 2 (two) times daily with a meal. (Patient not taking: Reported on 08/10/2019) 60 tablet  3   mupirocin ointment (BACTROBAN) 2 % Place 1 application into the nose 2 (two) times daily. (Patient not taking: Reported on 08/10/2019) 22 g 0   pantoprazole (PROTONIX) 40 MG tablet Take 1 tablet (40 mg total) by mouth daily. (Patient not taking: Reported on 08/10/2019) 30 tablet 3   potassium chloride SA (K-DUR,KLOR-CON) 10 MEQ tablet Take 1 tablet (10 mEq total) by mouth daily. (Patient not taking: Reported on 08/10/2019) 30 tablet 3      Home: Home Living Family/patient expects to be discharged to:: Private residence Living Arrangements: Children Available Help at Discharge: Family, Available 24 hours/day Type of Home: House Home Access: Stairs to enter [employer] of Steps: 3 Entrance Stairs-Rails: Right, Left Home Layout: Two level, Able to live on main level with bedroom/bathroom Bathroom Shower/Tub:  (pt performs bird baths) Bathroom Toilet: Standard Bathroom Accessibility: No Home Equipment: Gilmer Mor - single point  Lives With: Son  Functional History: Prior Function Level of Independence: Needs assistance Gait / Transfers Assistance Needed: pt reports ambulating independently  for short household distances with use of cane ADL's / Homemaking Assistance Needed: pt reports requiring assistance for all IADLs, takes a sponge bath, performs ADLs independently Functional Status:  Mobility: Bed Mobility Overal bed mobility: Needs Assistance Bed Mobility: Supine to Sit Supine to sit: Mod assist General bed mobility comments: modA to scoot forward to edge, able to independently elevate trunk up from bed Transfers Overall transfer level: Needs assistance Equipment used: 1 person hand held assist Transfers: Sit to/from Stand, Stand Pivot Transfers Sit to Stand: Mod assist Stand pivot transfers: Mod assist General transfer comment: pt with ataxic motion of LLE, requires PT physical assistance to mobilize and facilitate turning with LLE   ADL:   Cognition: Cognition Overall Cognitive Status: Impaired/Different from baseline Arousal/Alertness: Awake/alert Orientation Level: Oriented X4 Attention: Sustained Sustained Attention: Impaired Sustained Attention Impairment: Verbal basic Memory: Impaired Memory Impairment: Decreased recall of new information Awareness: Impaired Awareness Impairment: Intellectual impairment Problem Solving: Appears intact Behaviors: Restless Cognition Arousal/Alertness: Awake/alert Behavior During Therapy: WFL for tasks assessed/performed Overall Cognitive Status: Impaired/Different from baseline Area of Impairment: Attention, Memory, Following commands, Safety/judgement, Awareness, Problem solving Current Attention Level: Sustained Memory: Decreased recall of precautions, Decreased short-term memory Following Commands: Follows one step commands with increased time Safety/Judgement: Decreased awareness of safety, Decreased awareness of deficits Awareness: Intellectual Problem Solving: Slow processing, Requires verbal cues     Blood pressure (!) 144/58, pulse 82, temperature 99.2 F (37.3 C), temperature source Oral, resp. rate 13,  SpO2 94 %.   Physical Exam   General: Working with PT. On commode.  HEENT: Head is normocephalic, atraumatic, PERRLA, EOMI, sclera anicteric, oral mucosa pink and moist, dentition intact, ext ear canals clear,  Neck: Supple without JVD or lymphadenopathy Heart: Reg rate and rhythm. No murmurs rubs or gallops Chest: Wheezing (occasional auditory sounds) present.  Abdomen: Soft, non-tender, non-distended, bowel sounds positive. Extremities: No clubbing, cyanosis, or edema. Pulses are 2+ Skin: Skin is dry. IV in place. Onychomycotic toenails. Dry flaky skin bilateral feet.   Neuro: Mental Status: She is oriented to person, place, and time.     Comments: Left inattention with tendency to slump to the left. Left visual field deficits but able to scan to the left with cues. Left sided weakness with ataxia.  4/5 strength LUE, strength is otherwise intact. Difficulty with finger-to-nose on left Psych: pleasant affect, cooperative   Lab Results Last 24 Hours  No results found for this or any  previous visit (from the past 24 hour(s)).    Imaging Results (Last 48 hours)  CT ANGIO HEAD W OR WO CONTRAST   Result Date: 08/11/2019 CLINICAL DATA:  74 year old female code stroke presentation yesterday with acute left cerebellar hemorrhage. Ataxia. And MRI reveals superimposed acute right PCA territory infarct. Motion degraded CTA head and neck at [facility] yesterday. EXAM: CT ANGIOGRAPHY HEAD AND NECK TECHNIQUE: Multidetector CT imaging of the head and neck was performed using the standard protocol during bolus administration of intravenous contrast. Multiplanar CT image reconstructions and MIPs were obtained to evaluate the vascular anatomy. Carotid stenosis measurements (when applicable) are obtained utilizing NASCET criteria, using the distal internal carotid diameter as the denominator. CONTRAST:  50mL OMNIPAQUE IOHEXOL 350 MG/ML SOLN COMPARISON:  Brain MRI, head CT, and CTA head and neck  08/10/2019. FINDINGS: CTA NECK Skeleton: Most of the dentition is absent. Multilevel cervical spine degeneration with some levels of posterior element ankylosis. Mild T5 compression fracture appears nonacute. No acute osseous abnormality identified. Upper chest: Centrilobular emphysema throughout the visible lungs. No superior mediastinal lymphadenopathy. Other neck: Negative. Aortic arch: Extensive Calcified aortic atherosclerosis. Including a small area of ulcerated plaque at the distal arch on series 5, image 155, stable. Three vessel arch configuration. Right carotid system: Brachiocephalic and right CCA soft and calcified plaque without stenosis. Calcified plaque at the right ICA origin and bulb without stenosis. However, there is a focal 65 % stenosis just distal to the bulb best seen on series 8, image 92 which appears related to either soft plaque or perhaps a carotid web. The right ICA remains patent to the skull base. Left carotid system: Abundant soft and calcified plaque at the proximal left CCA with less than 50% stenosis. Complex calcified plaque at the left ICA origin, and at the distal bulb a short segment high-grade radiographic string sign stenosis occurs as seen on series 9, image 95. The left ICA remains patent to the skull base. Vertebral arteries: Proximal right subclavian artery plaque without stenosis. Dominant right vertebral artery with calcified plaque near the origin but no stenosis. Mild right V1 through V3 segment calcified plaque and tortuosity but no significant stenosis to the skull base. Proximal left subclavian artery soft and calcified plaque without significant stenosis. Non dominant left vertebral artery with severe origin stenosis (series 8, image 125) and tandem severe stenosis in the proximal V2 segment. Additional severe left V3 segment stenosis (series 8, image 118). The vessel is diminutive and functionally terminates or is occluded at the dura. CTA HEAD Posterior  circulation: Right vertebral artery supplies the basilar with V4 segment calcification resulting in up to moderate stenosis on series 7, image 138, which is proximal to the patent right PICA origin. Patent vertebrobasilar junction with probable retrograde supply to the left PICA which remains patent. Area of left cerebellar intra-axial hemorrhage on series 7, image 138. No CTA spot sign. No abnormal vascularity. Patent basilar artery without stenosis. Probable infundibulum rather than 3 mm saccular aneurysm at the left basilar tip, origin of the left SCA. Right SCA and left PCA origins are within normal limits. Posterior communicating arteries are diminutive or absent. However, just past the right PCA origin there is severe right PCA stenosis, and a diminutive appearance of the remainder of the vessel. No right PCA occlusion. Left PCA branches are mildly irregular. Anterior circulation: Both ICA siphons are patent but heavily calcified. On the left there is mild stenosis, but a superimposed 3-4 mm saccular aneurysm directed inferiorly from  the distal left ICA best seen on series 9, image 84. On the right there is mild to moderate siphon stenosis in the cavernous and supraclinoid segments. Patent carotid termini. Patent MCA and ACA origins. Moderate stenosis of the right ACA A1 segment (series 11, image 19). Anterior communicating artery within normal limits. Median artery of the corpus callosum (normal variant). Mild bilateral distal ACA branch irregularity (series 12, image 16). Left MCA M1 segment and bifurcation are patent, but there is a severe stenosis at the origin of the superior left M2 division (series 9, image 102 and series 11, image 119). However, no right MCA branch occlusion is identified. Left MCA M1 segment bifurcates early without stenosis. Right MCA branches are mildly irregular. Venous sinuses: Patent. Anatomic variants: Dominant right vertebral artery which supplies the basilar. Review of the MIP  images confirms the above findings IMPRESSION: 1. Negative for CTA spot sign or abnormal vascularity in the region of the left cerebellar parenchymal hemorrhage. 2. 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 3. Positive also for severe atherosclerosis throughout the head and neck. 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 - high-grade stenosis at the Left ICA distal bulb resulting in a short segment radiographic string sign stenosis. - 65% stenosis at the Right ICA distal bulb related to either soft plaque versus carotid web. - mild to moderate bilateral ICA siphon stenosis greater on the right. - Severe stenosis of the proximal Right PCA which is diminutive but does not occlude. - moderate stenosis of the dominant Right Vertebral Artery V4 segment which supplies the Basilar. - moderate stenosis Right ACA A1 segment. - Severe stenosis Right MCA M2 superior division origin. 4. Aortic Atherosclerosis (ICD10-I70.0) and Emphysema (ICD10-J43.9). Electronically Signed   By: Odessa Fleming M.D.   On: 08/11/2019 12:33    CT Head Wo Contrast   Result Date: 08/10/2019 CLINICAL DATA:  Ataxia, stroke suspected EXAM: CT HEAD WITHOUT CONTRAST TECHNIQUE: Contiguous axial images were obtained from the base of the skull through the vertex without intravenous contrast. COMPARISON:  CT and CT angiography 08/10/2019 FINDINGS: Brain: Redemonstration of the parenchymal hemorrhage within the paramedian inferior left cerebellar hemisphere with associated adjacent subarachnoid hemorrhage into the fourth ventricle and extending laterally into the basal cisterns. Overall  extent of this hemorrhage is not significantly changed from the comparison CT. No new areas of hyperdense hemorrhage are seen. No significant resulting mass effect. No CT evidence of a large acute vascular territory or cortically based infarct. Several stable hypoattenuating foci in the bilateral basal ganglia likely reflect sequela of remote lacunar infarcts. Benign senescent mineralization of the basal ganglia, similar to prior. Symmetric prominence of the ventricles, cisterns and sulci compatible with parenchymal volume loss. Patchy areas of white matter hypoattenuation are most compatible with chronic microvascular angiopathy. Vascular: Extensive calcification within the carotid siphons and right intradural vertebral artery. Skull: No calvarial fracture or suspicious osseous lesion. No scalp swelling or hematoma. Sinuses/Orbits: Paranasal sinuses and mastoid air cells are predominantly clear. Included orbital structures are unremarkable. Other: Absence of the maxillary dentition with the mandibular bridge. IMPRESSION: 1. Redemonstrated parenchymal hemorrhage within the paramedian inferior left cerebellum with associated subarachnoid hemorrhage slightly redistributed from prior without significant  interval increase in the size of the hemorrhage or new areas of hemorrhage identified. 2. Stable parenchymal volume loss and chronic microvascular angiopathy. 3. Remote lacunar infarcts in the bilateral basal ganglia. Electronically Signed   By: Kreg ShropshirePrice  DeHay M.D.   On: 08/10/2019 20:08    CT ANGIO NECK W OR WO CONTRAST   Result Date: 08/11/2019 CLINICAL DATA:  74 year old female code stroke presentation yesterday with acute left cerebellar hemorrhage. Ataxia. And MRI reveals superimposed acute right PCA territory infarct. Motion [facility]. EXAM: CT ANGIOGRAPHY HEAD AND NECK TECHNIQUE: Multidetector CT imaging of the head and neck was performed using the standard  protocol during bolus administration of intravenous contrast. Multiplanar CT image reconstructions and MIPs were obtained to evaluate the vascular anatomy. Carotid stenosis measurements (when applicable) are obtained utilizing NASCET criteria, using the distal internal carotid diameter as the denominator. CONTRAST:  50mL OMNIPAQUE IOHEXOL 350 MG/ML SOLN COMPARISON:  Brain MRI, head CT, and CTA head and neck 08/10/2019. FINDINGS: CTA NECK Skeleton: Most of the dentition is absent. Multilevel cervical spine degeneration with some levels of posterior element ankylosis. Mild T5 compression fracture appears nonacute. No acute osseous abnormality identified. Upper chest: Centrilobular emphysema throughout the visible lungs. No superior mediastinal lymphadenopathy. Other neck: Negative. Aortic arch: Extensive Calcified aortic atherosclerosis. Including a small area of ulcerated plaque at the distal arch on series 5, image 155, stable. Three vessel arch configuration. Right carotid system: Brachiocephalic and right CCA soft and calcified plaque without stenosis. Calcified plaque at the right ICA origin and bulb without stenosis. However, there is a focal 65 % stenosis just distal to the bulb best seen on series 8, image 92 which appears related to either soft plaque or perhaps a carotid web. The right ICA remains patent to the skull base. Left carotid system: Abundant soft and calcified plaque at the proximal left CCA with less than 50% stenosis. Complex calcified plaque at the left ICA origin, and at the distal bulb a short segment high-grade radiographic string sign stenosis occurs as seen on series 9, image 95. The left ICA remains patent to the skull base. Vertebral arteries: Proximal right subclavian artery plaque without stenosis. Dominant right vertebral artery with calcified plaque near the origin but no stenosis. Mild right V1 through V3 segment calcified plaque and tortuosity but no significant stenosis to the  skull base. Proximal left subclavian artery soft and calcified plaque without significant stenosis. Non dominant left vertebral artery with severe origin stenosis (series 8, image 125) and tandem severe stenosis in the proximal V2 segment. Additional severe left V3 segment stenosis (series 8, image 118). The vessel is diminutive and functionally terminates or is occluded at the dura. CTA HEAD Posterior circulation: Right vertebral artery supplies the basilar with V4 segment calcification resulting in up to moderate stenosis on series 7, image 138, which is proximal to the patent right PICA origin. Patent vertebrobasilar junction with probable retrograde supply to the left PICA which remains patent. Area of left cerebellar intra-axial hemorrhage on series 7, image 138. No CTA spot sign. No abnormal vascularity. Patent basilar artery without stenosis. Probable infundibulum rather than 3 mm saccular aneurysm at the left basilar tip, origin of the left SCA. Right SCA and left PCA origins are within normal limits. Posterior communicating arteries are diminutive or absent. However, just past the right PCA origin there is severe right PCA stenosis, and a diminutive appearance of the remainder of the vessel. No right PCA occlusion.  Left PCA branches are mildly irregular. Anterior circulation: Both ICA siphons are patent but heavily calcified. On the left there is mild stenosis, but a superimposed 3-4 mm saccular aneurysm directed inferiorly from the distal left ICA best seen on series 9, image 84. On the right there is mild to moderate siphon stenosis in the cavernous and supraclinoid segments. Patent carotid termini. Patent MCA and ACA origins. Moderate stenosis of the right ACA A1 segment (series 11, image 19). Anterior communicating artery within normal limits. Median artery of the corpus callosum (normal variant). Mild bilateral distal ACA branch irregularity (series 12, image 16). Left MCA M1 segment and bifurcation  are patent, but there is a severe stenosis at the origin of the superior left M2 division (series 9, image 102 and series 11, image 119). However, no right MCA branch occlusion is identified. Left MCA M1 segment bifurcates early without stenosis. Right MCA branches are mildly irregular. Venous sinuses: Patent. Anatomic variants: Dominant right vertebral artery which supplies the basilar. Review of the MIP images confirms the above findings IMPRESSION: 1. Negative for CTA spot sign or abnormal vascularity in the region of the left cerebellar parenchymal hemorrhage. 2. 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 3. Positive also for severe atherosclerosis throughout the head and neck. 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 - high-grade stenosis at the Left ICA distal bulb resulting in a short segment radiographic string sign stenosis. - 65% stenosis at the Right ICA distal bulb related to either soft plaque versus carotid web. - mild to moderate bilateral ICA siphon stenosis greater on the right. - Severe stenosis of the proximal Right PCA which is diminutive but does not occlude. - moderate stenosis of the dominant Right Vertebral Artery V4 segment which supplies the Basilar. - moderate stenosis Right ACA A1 segment. - Severe stenosis Right MCA M2 superior division origin. 4. Aortic Atherosclerosis (ICD10-I70.0) and Emphysema (ICD10-J43.9). Electronically Signed   By: Odessa Fleming M.D.   On: 08/11/2019 12:33    MR BRAIN W WO CONTRAST   Result Date: 08/11/2019 CLINICAL DATA:  Follow-up examination for acute intracranial hemorrhage, stroke. EXAM:  MRI HEAD WITHOUT AND WITH CONTRAST TECHNIQUE: Multiplanar, multiecho pulse sequences of the brain and surrounding structures were obtained without and with intravenous contrast. CONTRAST:  5.66mL GADAVIST GADOBUTROL 1 MMOL/ML IV SOLN COMPARISON:  Prior CTs from earlier the same day. FINDINGS: Brain: Examination moderately to severely degraded by motion artifact. Diffuse prominence of the CSF containing spaces compatible with moderately advanced age-related cerebral atrophy. Patchy and confluent T2/FLAIR hyperintensity within the periventricular deep white matter both cerebral hemispheres most consistent with chronic small vessel ischemic disease, moderate nature. Scattered superimposed remote lacunar infarcts present about the bilateral basal ganglia and thalami. Patchy and confluent areas of restricted diffusion involving the parasagittal right temporal occipital region including the right hippocampal formation, consistent with an acute right PCA territory infarct. Patchy involvement of the right thalamus noted. No associated hemorrhage or mass effect. Previously identified parenchymal hemorrhage involving the inferior left cerebellum again seen, not significantly changed in size measuring 1.4 x 1.2 x 1.5 cm (series 10, image 5). Mild surrounding vasogenic edema without significant regional mass effect. Associated small volume subarachnoid  hemorrhage within the adjacent posterior fossa, better appreciated on prior CT. No appreciable underlying mass lesion, vascular abnormality, or abnormal enhancement, although evaluation degraded by motion. No other evidence for acute or subacute infarct. Gray-white matter differentiation otherwise maintained. No encephalomalacia to suggest chronic cortical infarction elsewhere within the brain. No other evidence for acute or chronic intracranial hemorrhage. No mass lesion, midline shift or mass effect. No hydrocephalus or extra-axial fluid collection. Pituitary gland grossly  within normal limits. Midline structures intact. No other abnormal enhancement. Vascular: Major intracranial vascular flow voids are maintained. Skull and upper cervical spine: Craniocervical junction grossly within normal limits. Bone marrow signal intensity within normal limits. No scalp soft tissue abnormality. Sinuses/Orbits: Globes and orbital soft tissues within normal limits. Paranasal sinuses are largely clear. No mastoid effusion. Inner ear structures grossly normal. Other: None. IMPRESSION: 1. Technically limited exam due to extensive motion artifact. 2. No significant interval change in size of small intraparenchymal hemorrhage involving the inferior left cerebellum. Surrounding mild vasogenic edema without significant regional mass effect. Associated small volume subarachnoid hemorrhage better evaluated on prior CT. No appreciable underlying mass lesion or other abnormality identified. 3. 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 4. Underlying moderately advanced cerebral atrophy with chronic microvascular ischemic disease, with multiple remote lacunar infarcts about the deep gray nuclei bilaterally. Electronically Signed   By: Rise Mu M.D.   On: 08/11/2019 00:30    DG Chest Port 1 View   Result Date: 08/10/2019 CLINICAL DATA:  COPD. EXAM: PORTABLE CHEST 1 VIEW COMPARISON:  Radiograph earlier this day at [facility] FINDINGS: The cardiomediastinal contours are normal. Aortic atherosclerosis. Chronic interstitial coarsening and emphysema. Pulmonary vasculature is normal. No consolidation, pleural effusion, or pneumothorax. No acute osseous abnormalities are seen. IMPRESSION: 1. No acute abnormality. 2. Chronic interstitial coarsening and emphysema. Aortic Atherosclerosis (ICD10-I70.0). Electronically Signed   By: Narda Rutherford M.D.   On: 08/10/2019 23:31         Assessment/Plan: Diagnosis: Cerebellar  hemorrhage 1. Does the need for close, 24 hr/day medical supervision in concert with the patient's rehab needs make it unreasonable for this patient to be served in a less intensive setting? Yes 2. 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 Due to bladder management, bowel management, safety, skin/wound care, disease management, medication administration, pain management and patient education, does the patient require 24 hr/day rehab nursing? Yes 4. Does the patient require coordinated care of a physician, rehab nurse, therapy disciplines of PT, OT, SLP to address physical and functional deficits in the context of the above medical diagnosis(es)? Yes Addressing deficits in the following areas: balance, endurance, locomotion, strength, transferring, bowel/bladder control, bathing, dressing, feeding, grooming, toileting and psychosocial support, cognition 5. Can the patient actively participate in an intensive therapy program of at least 3 hrs of therapy per day at least 5 days per week? Yes 6. The potential for patient to make measurable gains while on inpatient rehab is good 7. Anticipated functional outcomes upon discharge from inpatient rehab are min assist  with PT, min assist with OT, supervision with SLP. 8. Estimated rehab length of stay to reach the above functional goals is: 12-16 days 9. Anticipated discharge destination: Home 10. Overall Rehab/Functional Prognosis: good   RECOMMENDATIONS: This patient's condition is appropriate for continued rehabilitative care in the following setting: CIR Patient has agreed to participate in recommended program.  Yes Note that insurance prior authorization may be required for reimbursement for recommended care.   Comment: Caitlin Vasquez would be an excellent CIR candidate if  family support can be confirmed as she will likely require supervision upon discharge from CIR.    Jacquelynn Cree, PA-C 08/12/2019    I have personally performed a face to face diagnostic evaluation, including, but not limited to relevant history and physical exam findings, of this patient and developed relevant assessment and plan.  Additionally, I have reviewed and concur with the physician assistant's documentation above.   [name], MD        Revision History                     Routing History           Note Details  Author Carlis Abbott, Drema Pry, MD File Time 08/12/2019 11:36 AM  Author Type Physician Status Signed  Last Editor [name], MD Service Physical Medicine and Rehabilitation  Hospital Acct # [id number] Admit Date 08/19/2019

## 2019-08-19 NOTE — H&P (Addendum)
Physical Medicine and Rehabilitation Admission H&P    CC: Stroke with functional deficits.    HPI: Caitlin Vasquez is a 74 year old female with history of COPD, A fib- no AC, PAD, anxiety d/o who was originally admitted 08/10/19 via [facility] with HA, left inattention and difficulty walking due to acute hemorrhage in paramedian left cerebellar hemisphere with small volume SAH UDS positive for opiates. 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 Dr. Maurice Small consulted for input and felt that no surgical intervention needed but to repeat MRI brain w/ w/o contrast when able due to uncommon location of hemorrhage.   She was started on Cleveprex for BP management and noted to have leucocytosis at admission with WBC-18.2.  She developed fever later that evening due to E coli UTI and treated with IV Rocephin. She also had issues with urinary retention requiring I/O caths. MBS done revealing aspiration of thins and she was started on dysphagia 3, nectar liquids. BP medications were slowly being resumed and she was cleared for intensive rehab program on 07/06. BP continued to be labile and patient reported worsening of headache.  She developed hypoxia early am of 07/08 with stats in 70's due to fluid overload and had improvement with IV diuresis. She was noted to be lethargic and has increase in weakness with decrease in mentation.   CT head was ordered due to concerns of extension and showed evolution of R-PCA infarct involving right parasagittal temporal lobe and right occipital lobe with mild associated mass effect and unchanged inferior L-IPH in left cerebellar hemisphere with small amount of vasogenic edema.  She was transferred to acute floor for workup and monitoring. She was found to have rise in troponin's to 920 and EKG showed  ST changes in lateral leads felt to bed due to NSTEMI. Patient without chest pain and not felt to be a candidate for intervention. HCTZ added to manage pulmonary edema per Dr. Algie Coffer. Electrolyte abnormalities have resolved with IV magnesium as well as addition of Kdur. Dr. Pearlean Brownie felt that patient with expected evolutionary changes and that  Hypoxia with hypertensive emergency accounted for increase in left sided deficits--and to would improve as parameters optimized.    She was started on IV ceftriaxone and transitioned to cefdinir to complete 7 total day antibiotic regimen. Medical issues have been optimized and she was cleared to resume CIR course.  Therapy has been resumed and patient with deficits in awareness, memory, attention and executive function.  Noted to have left lean without ability to self correct and needs min assist to sit EOB. Limited by fatigue and SOB and able to stand in [place] with max assist X 2.   Review of Systems  Constitutional: Negative for chills and fever.  HENT: Negative for hearing loss and tinnitus.   Eyes: Negative for blurred vision.  Respiratory: Positive for cough and shortness of breath.   Cardiovascular: Negative for chest pain and palpitations.  Gastrointestinal: Negative for abdominal pain, heartburn and nausea.  Musculoskeletal: Negative for myalgias.  Skin: Negative for rash.  Neurological: Positive for sensory change, speech change and focal weakness. Negative for dizziness and headaches.  Psychiatric/Behavioral: The patient is nervous/anxious (wants to jump out of her skin) and has insomnia.    Past Medical History:  Diagnosis Date  . COPD (chronic obstructive pulmonary  disease) (HCC)   . High cholesterol   . On home oxygen therapy    "2L; all the time" (04/06/2016)  . Pneumonia    "when I was a kid"   . Urinary hesitancy     Past Surgical History:  Procedure Laterality Date  . ABDOMINAL AORTOGRAM N/A 04/08/2016   Procedure: Abdominal  Aortogram;  Surgeon: Sherren Kernsharles E Fields, MD;  [facility];  Service: Cardiovascular;  Laterality: N/A;  . CARDIAC CATHETERIZATION N/A 11/28/2014   Procedure: Right/Left Heart Cath and Coronary Angiography;  Surgeon: Orpah CobbAjay Kadakia, MD;  Location: [facility] INVASIVE CV LAB;  Service: Cardiovascular;  Laterality: N/A;  . CARDIAC CATHETERIZATION  08/2006   Hattie Perch/notes 08/25/2006  . DRESSING CHANGE UNDER ANESTHESIA Left 04/12/2016   Procedure: DRESSING CHANGE LEFT HIP AND LEFT HEEL;  Surgeon: Fransisco HertzBrian L Chen, MD;  [facility];  Service: Vascular;  Laterality: Left;  . FEMORAL-POPLITEAL BYPASS GRAFT Left 04/12/2016   Procedure: LEFT FEMORAL-POPITEAL  BYPASS GRAFT;  Surgeon: Fransisco HertzBrian L Chen, MD;  [facility];  Service: Vascular;  Laterality: Left;  . JOINT REPLACEMENT    . LOWER EXTREMITY ANGIOGRAPHY Bilateral 04/08/2016   Procedure: Lower Extremity Angiography;  Surgeon: Sherren Kernsharles E Fields, MD;  [facility];  Service: Cardiovascular;  Laterality: Bilateral;  . PERIPHERAL VASCULAR INTERVENTION Bilateral 04/08/2016   Procedure: Peripheral Vascular Intervention;  Surgeon: Sherren Kernsharles E Fields, MD;  [facility];  Service: Cardiovascular;  Laterality: Bilateral;  common iliac  . TOTAL HIP ARTHROPLASTY Left 01/29/2016  . VEIN HARVEST Left 04/12/2016   Procedure: LEFT GREATER SAPHENOUS VEIN HARVEST;  Surgeon: Fransisco HertzBrian L Chen, MD;  [facility];  Service: Vascular;  Laterality: Left;    Family History  Problem Relation Age of Onset  . Heart attack Father     Social History:  reports that she quit smoking about 5 years ago. Her smoking use included cigarettes. She has a 55.00 pack-year smoking history. She has never used smokeless tobacco. She reports that she does not drink alcohol and does not use drugs.    Allergies  Allergen Reactions  . Clonidine Derivatives Other (See Comments)    hypotension    Medications Prior to Admission  Medication Sig Dispense Refill  .  acetaminophen (TYLENOL) 500 MG tablet Take 500 mg by mouth 2 (two) times daily as needed for mild pain.    Marland Kitchen. albuterol (PROVENTIL HFA;VENTOLIN HFA) 108 (90 BASE) MCG/ACT inhaler Inhale 2 puffs into the lungs every 6 (six) hours as needed for wheezing or shortness of breath.    Marland Kitchen. amLODipine (NORVASC) 5 MG tablet Take 1 tablet (5 mg total) by mouth daily.    Marland Kitchen. aspirin 81 MG EC tablet Take 1 tablet (81 mg total) by mouth daily. 30 tablet 3  . atorvastatin (LIPITOR) 80 MG tablet Take 1 tablet (80 mg total) by mouth daily.    . carvedilol (COREG) 12.5 MG tablet Take 1 tablet (12.5 mg total) by mouth 2 (two) times daily with a meal. (Patient not taking: Reported on 08/10/2019) 60 tablet 3  . enoxaparin (LOVENOX) 40 MG/0.4ML injection Inject 0.4 mLs (40 mg total) into the skin daily. 0 mL   . [START ON 08/20/2019] hydrochlorothiazide (MICROZIDE) 12.5 MG capsule Take 1 capsule (12.5 mg total) by mouth daily. 90 capsule 0  . [START ON 08/20/2019] lisinopril (ZESTRIL) 10 MG tablet Take 1 tablet (10 mg total) by mouth daily. 90 tablet 0  . Maltodextrin-Xanthan Gum (RESOURCE THICKENUP CLEAR) POWD  Take 120 g by mouth as needed (nectar thick liquids).    . senna-docusate (SENOKOT-S) 8.6-50 MG tablet Take 1 tablet by mouth 2 (two) times daily.    . sodium chloride 0.9 % infusion Inject 50 mLs into the vein continuous. 1000 mL 0    Drug Regimen Review  Drug regimen was reviewed and remains appropriate with no significant issues identified  Home: Home Living Family/patient expects to be discharged to:: Private residence Living Arrangements: Children Available Help at Discharge: Family, Available 24 hours/day (lives with son who can provide physical assist, also has other son and daughter-in-law as needed) Type of Home: House Home Access: Stairs to enter Secretary/administrator of Steps: 3 Entrance Stairs-Rails: Right, Left Home Layout: Two level, Able to live on main level with bedroom/bathroom Bathroom  Shower/Tub: Engineer, manufacturing systems: Standard Home Equipment: Medical laboratory scientific officer - single point  Lives With: Son   Functional History: Prior Function Level of Independence: Needs assistance Gait / Transfers Assistance Needed: pt reports ambulating independently for short household distances with use of cane ADL's / Homemaking Assistance Needed: pt reports requiring assistance for all IADLs, takes a sponge bath, performs ADLs independently  Functional Status:  Mobility: Bed Mobility Overal bed mobility: Needs Assistance Bed Mobility: Rolling, Sidelying to Sit, Sit to Sidelying Rolling: Mod assist Sidelying to sit: Max assist Supine to sit: Max assist, +2 for physical assistance Sit to supine: Total assist, +2 for physical assistance Sit to sidelying: Max assist General bed mobility comments: cues for rolling and technique to progress LEs off of bed and push trunk up to sitting; now leaning to left but not really pushing; MinA to maintain balance with cues to promote postural alignment Transfers Overall transfer level: Needs assistance Equipment used: Ambulation equipment used Transfer via Lift Equipment: Stedy Transfers: Sit to/from Stand Sit to Stand: Max assist, +2 physical assistance General transfer comment: able to briefly stand in stedy for placement of stedy seat pads but unable to manitain standing for significant amount of time Ambulation/Gait General Gait Details: unable- fatigue ADL: ADL Overall ADL's : Needs assistance/impaired Eating/Feeding: Moderate assistance, Bed level Grooming: Minimal assistance, Brushing hair, Sitting Grooming Details (indicate cue type and reason): Min A for brushing hair sitting EOB. Assistance needed to maintain sitting balance EOB. Pt able to brush L side of hair without verbal cues needed. Min A needed for throughness due to tangled hair Upper Body Bathing: Maximal assistance, Bed level Lower Body Bathing: Total assistance, Bed level Upper  Body Dressing : Maximal assistance, Bed level Upper Body Dressing Details (indicate cue type and reason): Max A to don hospital gown bed level, high distractability and decreased coordination  Lower Body Dressing: Total assistance, Bed level Toilet Transfer: Total assistance, +2 for physical assistance Toileting- Clothing Manipulation and Hygiene: Total assistance, Bed level General ADL Comments: Pt requires extensive assist for all ADLs due to expansion of CVA with impairments in coordination, strength, cognition, vision, and endurance (secondary to inclusion of COPD diagnosis).   Cognition: Cognition Overall Cognitive Status: Impaired/Different from baseline Arousal/Alertness: Awake/alert Orientation Level: Oriented X4 Attention: Focused, Sustained Focused Attention: Appears intact Sustained Attention: Impaired Sustained Attention Impairment: Verbal complex Memory: Impaired Memory Impairment: Storage deficit, Retrieval deficit, Decreased recall of new information (Immediate: 5/5; delayed: 3/5) Awareness: Impaired Awareness Impairment: Intellectual impairment Problem Solving: Impaired Problem Solving Impairment: Verbal complex Executive Function: Organizing, Sequencing Sequencing: Impaired Sequencing Impairment: Verbal complex (clock drawing: 0/4) Organizing: Impaired Organizing Impairment: Verbal complex (Backward digit span: 1/3) Cognition Arousal/Alertness: Awake/alert Behavior During  Therapy: Flat affect Overall Cognitive Status: Impaired/Different from baseline Area of Impairment: Attention, Following commands, Memory, Safety/judgement, Awareness, Problem solving Current Attention Level: Sustained Memory: Decreased recall of precautions Following Commands: Follows one step commands consistently, Follows multi-step commands inconsistently, Follows one step commands with increased time Safety/Judgement: Decreased awareness of safety, Decreased awareness of deficits Awareness:  Emergent Problem Solving: Slow processing, Decreased initiation, Difficulty sequencing, Requires verbal cues, Requires tactile cues General Comments: poor awareness of deficits or ability to correct balance/posture, impaired sequencing   Physical Exam: Blood pressure (!) 150/56, pulse 66, temperature 98.3 F (36.8 C), resp. rate 17, SpO2 98 %. Physical Exam Vitals and nursing note reviewed.  Constitutional:      General: She is not in acute distress.    Appearance: She is underweight.     Interventions: Nasal cannula in place.  Eyes:     Comments: Crusty drainage on both eyelids.  Neck: Supple without JVD or lymphadenopathy Heart: Reg rate and rhythm. No murmurs rubs or gallops Pulmonary:     Breath sounds: Rhonchi present.     Comments: Intermittent congested cough noted.  Musculoskeletal:     Cervical back: No rigidity.  Neurological:     Mental Status: She is oriented to person, place, and time and easily aroused. She is lethargic.     Comments: Needed cues to stay awake--"tired". Left facial weakness with raspy, congested voice. Right gaze preference with left inattention and was unable to move eyes beyond midline. Needed max cues to attend to left and had poor awareness of deficits. Left sided weakness with left sensory deficits.   Psych: Pt's affect is lethargic. Pt is cooperative  Results for orders placed or performed during the hospital encounter of 08/14/19 (from the past 48 hour(s))  Glucose, capillary     Status: Abnormal   Collection Time: 08/17/19  9:21 PM  Result Value Ref Range   Glucose-Capillary 141 (H) 70 - 99 mg/dL    Comment: Glucose reference range applies only to samples taken after fasting for at least 8 hours.  Glucose, capillary     Status: Abnormal   Collection Time: 08/18/19  6:36 AM  Result Value Ref Range   Glucose-Capillary 110 (H) 70 - 99 mg/dL    Comment: Glucose reference range applies only to samples taken after fasting for at least 8 hours.   Glucose, capillary     Status: Abnormal   Collection Time: 08/18/19  8:20 AM  Result Value Ref Range   Glucose-Capillary 129 (H) 70 - 99 mg/dL    Comment: Glucose reference range applies only to samples taken after fasting for at least 8 hours.  Glucose, capillary     Status: Abnormal   Collection Time: 08/18/19 11:51 AM  Result Value Ref Range   Glucose-Capillary 107 (H) 70 - 99 mg/dL    Comment: Glucose reference range applies only to samples taken after fasting for at least 8 hours.  Glucose, capillary     Status: Abnormal   Collection Time: 08/18/19  4:40 PM  Result Value Ref Range   Glucose-Capillary 146 (H) 70 - 99 mg/dL    Comment: Glucose reference range applies only to samples taken after fasting for at least 8 hours.  Glucose, capillary     Status: Abnormal   Collection Time: 08/18/19  9:29 PM  Result Value Ref Range   Glucose-Capillary 135 (H) 70 - 99 mg/dL    Comment: Glucose reference range applies only to samples taken after fasting for at least  8 hours.  Glucose, capillary     Status: Abnormal   Collection Time: 08/19/19  6:14 AM  Result Value Ref Range   Glucose-Capillary 110 (H) 70 - 99 mg/dL    Comment: Glucose reference range applies only to samples taken after fasting for at least 8 hours.  Glucose, capillary     Status: Abnormal   Collection Time: 08/19/19  9:11 AM  Result Value Ref Range   Glucose-Capillary 126 (H) 70 - 99 mg/dL    Comment: Glucose reference range applies only to samples taken after fasting for at least 8 hours.  Glucose, capillary     Status: Abnormal   Collection Time: 08/19/19  1:05 PM  Result Value Ref Range   Glucose-Capillary 119 (H) 70 - 99 mg/dL    Comment: Glucose reference range applies only to samples taken after fasting for at least 8 hours.   No results found.  Medical Problem List and Plan: 1.  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  Antithrombotics: -DVT/anticoagulation:  Mechanical: Sequential compression devices, below knee Bilateral lower extremities  -antiplatelet therapy: N/a due to bleed.  3. Pain Management: Back pain resolved with treatment of UTI. Discontinue hydrocodone and will use tramadol or tylenol prn for now.  4. Anxiety disorder/Mood: Per patient/son was using ? Medication for anxiety.   -antipsychotic agents: N/a 5. Neuropsych: This patient is not fully capable of making decisions on her own behalf. 6. Skin/Wound Care:  Routine pressure relief measures.  7. Fluids/Electrolytes/Nutrition: Strict I/O. Check labs in am. 8. NSTEMI with fluid overload: Treated medically with Coreg, Lisinopril and Lipitor.  9. Severe COPD: Encourage pulmonary hygiene--will order flutter. Wean oxygen to off as able as has not use it for past year per son. Albuterol MDI prn. Will order CXR as continues to have SOB with activity.   10. E coli UTI: Has completed 7 day antibiotic course on 07/11.  11. HTN: SBP goal <160. Monitor BP tid--on Norvasc and HCTZ. BP is much better controlled now off Labetalol. 12. Lethargy: More lethargic than when I last saw her. Multifactorial? Received hydrocodone X 2 yesterday and Ambien last night. Will change 13. Urinary retention: Continue monitor voiding with PVR checks/bladder scans 4-6 hours and I/O caht to keep volumes <300 cc.  14. A fib: Not AC candidate due to hemorrhage.  15. Dysphagia: On dysphagia 3 diet with nectar liquids.  16. Tachypnea: RR is up to 23 right now- need to monitor closely.   Jacquelynn Cree, PA-C 08/19/2019    I have personally performed a face to face diagnostic evaluation, including, but not limited to relevant history and physical exam findings, of this patient and developed relevant assessment and plan.  Additionally, I have reviewed and concur with the physician assistant's documentation above.  [name], MD

## 2019-08-19 NOTE — Progress Notes (Signed)
Inpatient Rehabilitation Medication Review by a Pharmacist  A complete drug regimen review was completed for this patient to identify any potential clinically significant medication issues.  Clinically significant medication issues were identified:  no  Pharmacist comments: Confirmed with Dr. Carlis Abbott - discharge summary included aspirin and lovenox prophylaxis, but she will not be resuming these medications on transition to rehab at this time.  Time spent performing this drug regimen review (minutes):  10 minutes   Katheran James 08/19/2019 4:33 PM

## 2019-08-19 NOTE — Progress Notes (Signed)
Patient arrived on unit, oriented to unit. Reviewed medications, therapy schedule, rehab routine and plan of care. States an understanding of information reviewed. AX4. No complications noted.  Meredeth Ide Celie Desrochers

## 2019-08-20 ENCOUNTER — Inpatient Hospital Stay (HOSPITAL_COMMUNITY): Payer: Medicare Other | Admitting: Physical Therapy

## 2019-08-20 ENCOUNTER — Inpatient Hospital Stay (HOSPITAL_COMMUNITY): Payer: Medicare Other

## 2019-08-20 ENCOUNTER — Inpatient Hospital Stay (HOSPITAL_COMMUNITY): Payer: Medicare Other | Admitting: Occupational Therapy

## 2019-08-20 DIAGNOSIS — I614 Nontraumatic intracerebral hemorrhage in cerebellum: Secondary | ICD-10-CM

## 2019-08-20 LAB — COMPREHENSIVE METABOLIC PANEL
ALT: 18 U/L (ref 0–44)
AST: 20 U/L (ref 15–41)
Albumin: 2.8 g/dL — ABNORMAL LOW (ref 3.5–5.0)
Alkaline Phosphatase: 70 U/L (ref 38–126)
Anion gap: 11 (ref 5–15)
BUN: 24 mg/dL — ABNORMAL HIGH (ref 8–23)
CO2: 28 mmol/L (ref 22–32)
Calcium: 8.8 mg/dL — ABNORMAL LOW (ref 8.9–10.3)
Chloride: 98 mmol/L (ref 98–111)
Creatinine, Ser: 1.06 mg/dL — ABNORMAL HIGH (ref 0.44–1.00)
GFR calc Af Amer: 59 mL/min — ABNORMAL LOW (ref >60)
GFR calc non Af Amer: 51 mL/min — ABNORMAL LOW (ref >60)
Glucose, Bld: 109 mg/dL — ABNORMAL HIGH (ref 70–99)
Potassium: 4.3 mmol/L (ref 3.5–5.1)
Sodium: 137 mmol/L (ref 135–145)
Total Bilirubin: 0.7 mg/dL (ref 0.3–1.2)
Total Protein: 6.5 g/dL (ref 6.5–8.1)

## 2019-08-20 LAB — CBC WITH DIFFERENTIAL/PLATELET
Abs Immature Granulocytes: 0.03 10*3/uL (ref 0.00–0.07)
Basophils Absolute: 0 10*3/uL (ref 0.0–0.1)
Basophils Relative: 0 %
Eosinophils Absolute: 0.2 10*3/uL (ref 0.0–0.5)
Eosinophils Relative: 2 %
HCT: 41.4 % (ref 36.0–46.0)
Hemoglobin: 13.3 g/dL (ref 12.0–15.0)
Immature Granulocytes: 0 %
Lymphocytes Relative: 17 %
Lymphs Abs: 1.6 10*3/uL (ref 0.7–4.0)
MCH: 29 pg (ref 26.0–34.0)
MCHC: 32.1 g/dL (ref 30.0–36.0)
MCV: 90.2 fL (ref 80.0–100.0)
Monocytes Absolute: 0.7 10*3/uL (ref 0.1–1.0)
Monocytes Relative: 7 %
Neutro Abs: 7.1 10*3/uL (ref 1.7–7.7)
Neutrophils Relative %: 74 %
Platelets: 312 10*3/uL (ref 150–400)
RBC: 4.59 MIL/uL (ref 3.87–5.11)
RDW: 13.7 % (ref 11.5–15.5)
WBC: 9.6 10*3/uL (ref 4.0–10.5)
nRBC: 0 % (ref 0.0–0.2)

## 2019-08-20 LAB — GLUCOSE, CAPILLARY
Glucose-Capillary: 99 mg/dL (ref 70–99)
Glucose-Capillary: 135 mg/dL — ABNORMAL HIGH (ref 70–99)
Glucose-Capillary: 104 mg/dL — ABNORMAL HIGH (ref 70–99)
Glucose-Capillary: 119 mg/dL — ABNORMAL HIGH (ref 70–99)

## 2019-08-20 MED ORDER — POTASSIUM CHLORIDE CRYS ER 10 MEQ PO TBCR
10.0000 meq | EXTENDED_RELEASE_TABLET | Freq: Every day | ORAL | Status: DC
  Administered 2019-08-21 – 2019-09-09 (×20): 10 meq via ORAL
  Filled 2019-08-20 (×20): qty 1

## 2019-08-20 MED ORDER — SENNA 8.6 MG PO TABS
1.0000 | ORAL_TABLET | Freq: Every day | ORAL | Status: DC
  Administered 2019-08-21 – 2019-08-26 (×6): 8.6 mg via ORAL
  Filled 2019-08-20 (×6): qty 1

## 2019-08-20 MED ORDER — BLOOD PRESSURE CONTROL BOOK
Freq: Once | Status: AC
  Filled 2019-08-20: qty 1

## 2019-08-20 MED ORDER — GERHARDT'S BUTT CREAM
1.0000 "application " | TOPICAL_CREAM | Freq: Three times a day (TID) | CUTANEOUS | Status: DC
  Administered 2019-08-20 – 2019-09-12 (×63): 1 via TOPICAL
  Filled 2019-08-20 (×3): qty 1

## 2019-08-20 MED ORDER — HYDROCORTISONE (PERIANAL) 2.5 % EX CREA
1.0000 "application " | TOPICAL_CREAM | Freq: Three times a day (TID) | CUTANEOUS | Status: DC
  Administered 2019-08-20 – 2019-08-27 (×15): 1 via RECTAL
  Filled 2019-08-20 (×2): qty 28.35

## 2019-08-20 NOTE — Evaluation (Signed)
Speech Language Pathology Assessment and Plan  Patient Details  Name: Caitlin Vasquez MRN: [id number] Date of Birth: [birth year]  SLP Diagnosis: Cognitive Impairments;Dysphagia  Rehab Potential: Good ELOS: 16-20 days    Today's Date: 08/20/2019 SLP Individual Time: 4239-5320 SLP Individual Time Calculation (min): 69 min   Hospital Problem: Principal Problem:   Cerebellar hemorrhage ([place])  Past Medical History:  Past Medical History:  Diagnosis Date  . COPD (chronic obstructive pulmonary disease) ([place])   . High cholesterol   . On home oxygen therapy    "2L; all the time" (04/06/2016)  . Pneumonia    "when I was a kid"   . Urinary hesitancy    Past Surgical History:  Past Surgical History:  Procedure Laterality Date  . ABDOMINAL AORTOGRAM N/A 04/08/2016   Procedure: Abdominal Aortogram;  Surgeon: Elam Dutch, MD;  Location: [place] [facility];  Service: Cardiovascular;  Laterality: N/A;  . CARDIAC CATHETERIZATION N/A 11/28/2014   Procedure: Right/Left Heart Cath and Coronary Angiography;  Surgeon: Dixie Dials, MD;  Location: [place] [facility];  Service: Cardiovascular;  Laterality: N/A;  . CARDIAC CATHETERIZATION  08/2006   Archie Endo 08/25/2006  . DRESSING CHANGE UNDER ANESTHESIA Left 04/12/2016   Procedure: DRESSING CHANGE LEFT HIP AND LEFT HEEL;  Surgeon: Conrad [place], MD;  Location: [place];  Service: Vascular;  Laterality: Left;  . FEMORAL-POPLITEAL BYPASS GRAFT Left 04/12/2016   Procedure: LEFT FEMORAL-POPITEAL  BYPASS GRAFT;  Surgeon: Conrad Crofton, MD;  Location: [facility];  Service: Vascular;  Laterality: Left;  . JOINT REPLACEMENT    . LOWER EXTREMITY ANGIOGRAPHY Bilateral 04/08/2016   Procedure: Lower Extremity Angiography;  Surgeon: Elam Dutch, MD;  Location: [place] [facility];  Service: Cardiovascular;  Laterality: Bilateral;  . PERIPHERAL VASCULAR INTERVENTION Bilateral 04/08/2016   Procedure: Peripheral Vascular Intervention;  Surgeon: Elam Dutch, MD;  Location:  [facility] [facility];  Service: Cardiovascular;  Laterality: Bilateral;  common iliac  . TOTAL HIP ARTHROPLASTY Left 01/29/2016  . VEIN HARVEST Left 04/12/2016   Procedure: LEFT GREATER SAPHENOUS VEIN HARVEST;  Surgeon: Conrad Roseland, MD;  Location: [facility];  Service: Vascular;  Laterality: Left;    Assessment / Plan / Recommendation Clinical Impression Caitlin Vasquez a 74 y.o. 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 MRI brain showed no change in small IPH left cerebellum with surrounding mass effect, small amount SAH and moderate ischemic infarct R-PCA with moderately advanced cerebral atrophy. Dr. Joaquim Nam consulted for input and felt that no surgical intervention needed and recommended MRI w/wo contrast when able to rule out underlying mass. She was started on Cleveprex for BP control and 2 D echo done for work up. She has had urinary retention requiring I/O caths.Therapy evaluations completed revealing deficits in attention and memory, significant left inattention with ataxia affecting functional status. CIR was recommended due to functional deficits. 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 Her  respiratory status has improved after Lasix, nitropaste and she is currently on 3L [place]. Since return to acute a head CT showed interval  evolution of large right PCA territory infarct. Cardiology consulted due to elevated HS-troponin 1; recommendations were to add HCTZ and potassium for BP and pulmonary edema. Pt was not deemed to be a candidate for cardiac interventions at this time. On 7/8 pt showed 20 beat V-tach with recommendation for IV magnesium was given. VS stable and BP improved. Therapy re-evaluated since return to acute with recommendations for CIR. Pt is to admit to CIR on 7/12.  Pt demonstrated moderate oropharyngeal dysphagia. Oral motor examination indicated reduced oral control due to buccal and lingual weakness as well as reduced lingual ROM.  Pt consumed thin liquids trials via cup x2 with immediate weak cough noted. Pt consumed nectar thick liquids via cup and straw, dys 3 texture and regular textured snacks with no overt s/s aspiration. Pt demonstrated reduced bolus cohesions of regular textures, however liquid wash provided by SLP was effective in removing oral residue. SLP recommends continued diet of dsy 3 textures and nectar thick liquids with full supervision A for aid in self-feeding and to ensure oral clearance. Pt will participate in regular texture and thin liquid trials at beside, requiring repeat MBS prior to liquid advancement due to inconsistent aspiration noted on most recent MBS. Pt demonstrated severe cognitive impairments, pertaining to intellectual awareness and left inattention. Pt demonstrated moderate deficits in sustained attention and basic problem solving and mild deficits in short term recall. Pt scored 15 out 30 (n=>20 reflecting education level) on SLUMS. SLP communicated with pt's son , Caitlin Vasquez, via phone. Caitlin Vasquez supports pt being near cognitive baseline except novel left inattention but recognizes pt might of required increased cognitive assistance prior to admission; pt was non-compliant with medication and attending medical appointments. No speech impairments noted. Pt would benefit from skills ST  services in order to maximize functional independence and reduce burden of care, requiring 24 hour supervision and continued ST services.   Skilled Therapeutic Interventions          Skilled ST services focused on cognitive skills. SLP facilitated cognitive linguistic assessment, provided education on current deficits and plan for treatment. All questions answered to satisfaction. Pt was left in room with call bell within reach and bed alarm set. ST recommends to continue skilled ST services.  SLP Assessment  Patient will need skilled Speech Lanaguage Pathology Services during CIR admission    Recommendations  SLP Diet Recommendations: Dysphagia 3 (Mech soft);Nectar Liquid Administration via: Cup;Straw Medication Administration: Whole meds with puree Supervision: Staff to assist with self feeding;Full supervision/cueing for compensatory strategies Compensations: Slow rate;Small sips/bites Postural Changes and/or Swallow Maneuvers: Seated upright 90 degrees Oral Care Recommendations: Oral care BID Patient destination: Home Follow up Recommendations: 24 hour supervision/assistance;Home Health SLP;Outpatient SLP Equipment Recommended: None recommended by SLP    SLP Frequency 3 to 5 out of 7 days   SLP Duration  SLP Intensity  SLP Treatment/Interventions 16-20 days  Minumum of 1-2 x/day, 30 to 90 minutes  Cognitive remediation/compensation;Cueing hierarchy;Dysphagia/aspiration precaution training;Patient/family education;Internal/external aids;Functional tasks    Pain Pain Assessment Pain Score: 0-No pain  Prior Functioning Cognitive/Linguistic Baseline: Baseline deficits Baseline deficit details: Pt's son reported mild difficulty with memory at baseline and noncomplaince with medication/medical appiontments Type of Home: House  Lives With: Son Available Help at Discharge: Family;Available 24 hours/day Education: 9th grade Vocation: Retired  Programmer, systems Overall  Cognitive Status: Impaired/Different from baseline  Arousal/Alertness: Awake/alert Orientation Level: Oriented to place;Oriented to situation;Disoriented to time Attention: Focused;Sustained Focused Attention: Appears intact Sustained Attention: Impaired Sustained Attention Impairment: Functional basic;Verbal basic Memory: Impaired Memory Impairment: Decreased recall of new information Awareness: Impaired Awareness Impairment: Intellectual impairment Problem Solving: Impaired Problem Solving Impairment: Functional basic Safety/Judgment: Impaired  Comprehension Auditory Comprehension Overall Auditory Comprehension: Appears within functional limits for tasks assessed Expression Expression Primary Mode of Expression: Verbal Verbal Expression Overall Verbal Expression: Appears within functional limits for tasks assessed Oral Motor Oral Motor/Sensory Function Overall Oral Motor/Sensory Function: Mild impairment Facial ROM: Reduced left;Suspected CN VII (facial) dysfunction Facial Symmetry: Abnormal symmetry left;Suspected CN VII (facial) dysfunction Facial Strength: Reduced left;Suspected CN VII (facial) dysfunction Facial Sensation: Within Functional Limits Lingual ROM: Reduced left;Suspected CN XII (hypoglossal) dysfunction Lingual Symmetry: Within Functional Limits Lingual Strength: Reduced;Suspected CN XII (hypoglossal) dysfunction Lingual Sensation: Within Functional Limits Velum: Within Functional Limits Mandible: Within Functional Limits Motor Speech Overall Motor Speech: Appears within functional limits for tasks assessed Respiration: Within functional limits Phonation: Normal Resonance: Within functional limits Articulation: Within functional limitis Intelligibility: Intelligible Motor Planning: Witnin functional limits Motor Speech Errors: Not applicable   PMSV Assessment  PMSV Trial Intelligibility: Intelligible  Bedside Swallowing Assessment General Date of  Onset: 08/10/19 Previous Swallow Assessment: 7/5 dys 3 and nectar thick liquids Diet Prior to this Study: Dysphagia 3 (soft);Nectar-thick liquids Respiratory Status: Supplemental O2 delivered via (comment) History of Recent Intubation: No Behavior/Cognition: Cooperative;Requires cueing Oral Cavity - Dentition: Dentures, top;Dentures, bottom Self-Feeding Abilities: Needs assist Vision: Impaired for self-feeding Patient Positioning: Upright in bed Baseline Vocal Quality: Normal Volitional Cough: Weak Volitional Swallow: Able to elicit  Oral Care Assessment Does patient have any of the following "high(er) risk" factors?: None of the above Does patient have any of the following "at risk" factors?: Oxygen therapy - cannula, mask, simple oxygen devices Patient is AT RISK: Order set for Adult Oral Care Protocol initiated -  "At Risk Patients" option selected (see row information) Patient is LOW RISK: Follow universal precautions (see row information) Ice Chips Ice chips: Not tested Thin Liquid Thin Liquid: Impaired Presentation: Cup;Spoon Pharyngeal  Phase Impairments: Cough - Immediate Nectar Thick Nectar Thick Liquid: Within functional limits Presentation: Cup;Straw Honey Thick Honey Thick Liquid: Not tested Puree Puree: Not tested Solid Solid: Impaired Presentation: Self Fed Oral Phase Impairments: Impaired mastication Oral Phase Functional Implications: Oral residue BSE Assessment Risk for Aspiration Impact on safety and function: Mild aspiration risk Other Related Risk Factors: History of dysphagia  Short Term Goals: Week 1: SLP Short Term Goal 1 (Week 1): Pt will scan left of midline during functional tasks with mod A verbal and visual cues. SLP Short Term Goal 2 (Week 1): Pt will demonstrate sustained attention in 10 minute intervals during functional tasks with mod A verbal cues for redirection, SLP Short Term Goal 3 (Week 1): Pt will idenitfy 1 swallow or cognitive and 1  phsyical deficits from acute injury with max A verbal cues. SLP Short Term Goal 4 (Week 1): Pt will consume dys 3 textures and nectar thick liquids with supervision A verbal cues for swallow strategies. SLP Short Term Goal 5 (Week 1): Pt will participate in trials of thin liquids with minimal s/s aspirtation and vital signs remaining WFL prior to repeat instrumental swallow study. SLP Short Term Goal 6 (Week 1): Pt will consume regular textures with appropriate oral clearance given mod A verbal cues for use of swallow strategies.  Refer to Care Plan for Long Term Goals  Recommendations for other services: None   Discharge Criteria: Patient will be discharged from SLP if patient refuses treatment 3 consecutive times without medical reason, if treatment goals not met, if there is a change in medical status, if patient makes no progress towards goals or if patient is discharged from hospital.  The above assessment, treatment plan, treatment alternatives and goals were discussed and mutually agreed upon: by patient and by family  Reg Bircher  [facility] 08/20/2019, 3:53 PM

## 2019-08-20 NOTE — Plan of Care (Signed)
  Problem: Consults Goal: RH STROKE PATIENT EDUCATION Description: See Patient Education module for education specifics  Outcome: Progressing   Problem: RH BOWEL ELIMINATION Goal: RH STG MANAGE BOWEL WITH ASSISTANCE Description: STG Manage Bowel with mod Assistance. Outcome: Progressing Goal: RH STG MANAGE BOWEL W/MEDICATION W/ASSISTANCE Description: STG Manage Bowel with Medication with mod Assistance. Outcome: Progressing   Problem: RH BLADDER ELIMINATION Goal: RH STG MANAGE BLADDER WITH ASSISTANCE Description: STG Manage Bladder With mod Assistance Outcome: Progressing   Problem: RH SKIN INTEGRITY Goal: RH STG SKIN FREE OF INFECTION/BREAKDOWN Description: No additional breakdown will occur while iin rehab with mod assistance  Outcome: Progressing Goal: RH STG MAINTAIN SKIN INTEGRITY WITH ASSISTANCE Description: STG Maintain Skin Integrity With mod Assistance. Outcome: Progressing   Problem: RH SAFETY Goal: RH STG ADHERE TO SAFETY PRECAUTIONS W/ASSISTANCE/DEVICE Description: STG Adhere to Safety Precautions With mod Assistance/Device. Outcome: Progressing   Problem: RH PAIN MANAGEMENT Goal: RH STG PAIN MANAGED AT OR BELOW PT'S PAIN GOAL Description: Pt will report pain of less than 4 with min assistance Outcome: Progressing   Problem: RH KNOWLEDGE DEFICIT Goal: RH STG INCREASE KNOWLEDGE OF HYPERTENSION Description: Family will review stroke book and be able to identify factors related to hypertension with min assist Outcome: Progressing Goal: RH STG INCREASE KNOWLEDGE OF STROKE PROPHYLAXIS Description: Family will be able to identify stroke prophylaxis with min assist Outcome: Progressing

## 2019-08-20 NOTE — Progress Notes (Signed)
Patient ID: Caitlin Vasquez, female   DOB: 10-05-1945, 74 y.o.   MRN: [id number]   Sw attempted to call patient son, no response. Will continue to attempt.

## 2019-08-20 NOTE — Progress Notes (Signed)
Inpatient Rehabilitation  Patient information reviewed and entered into eRehab system by Daphna Lafuente M. Jojuan Champney, M.A., CCC/SLP, PPS Coordinator.  Information including medical coding, functional ability and quality indicators will be reviewed and updated through discharge.    

## 2019-08-20 NOTE — Evaluation (Signed)
Physical Therapy Assessment and Plan  Patient Details  Name: Caitlin Vasquez MRN: [id number] Date of Birth: 05-15-1945  PT Diagnosis: Abnormal posture, Abnormality of gait, Cognitive deficits, Coordination disorder, Difficulty walking, Hemiparesis non-dominant, Hypotonia, Impaired cognition, Impaired sensation and Muscle weakness Rehab Potential: Fair ELOS: ~4weeks   Today's Date: 08/20/2019 PT Individual Time: 1453-1550 PT Individual Time Calculation (min): 57 min    Hospital Problem: Principal Problem:   Cerebellar hemorrhage ([place])   Past Medical History:  Past Medical History:  Diagnosis Date  . COPD (chronic obstructive pulmonary disease) ([place])   . High cholesterol   . On home oxygen therapy    "2L; all the time" (04/06/2016)  . Pneumonia    "when I was a kid"   . Urinary hesitancy    Past Surgical History:  Past Surgical History:  Procedure Laterality Date  . ABDOMINAL AORTOGRAM N/A 04/08/2016   Procedure: Abdominal Aortogram;  Surgeon: Elam Dutch, MD;  Location: [place] [facility];  Service: Cardiovascular;  Laterality: N/A;  . CARDIAC CATHETERIZATION N/A 11/28/2014   Procedure: Right/Left Heart Cath and Coronary Angiography;  Surgeon: Dixie Dials, MD;  Location: [place] [facility];  Service: Cardiovascular;  Laterality: N/A;  . CARDIAC CATHETERIZATION  08/2006   Archie Endo 08/25/2006  . DRESSING CHANGE UNDER ANESTHESIA Left 04/12/2016   Procedure: DRESSING CHANGE LEFT HIP AND LEFT HEEL;  Surgeon: Conrad [place], MD;  Location: [place];  Service: Vascular;  Laterality: Left;  . FEMORAL-POPLITEAL BYPASS GRAFT Left 04/12/2016   Procedure: LEFT FEMORAL-POPITEAL  BYPASS GRAFT;  Surgeon: Conrad McRae-Helena, MD;  Location: [place];  Service: Vascular;  Laterality: Left;  . JOINT REPLACEMENT    . LOWER EXTREMITY ANGIOGRAPHY Bilateral 04/08/2016   Procedure: Lower Extremity Angiography;  Surgeon: Elam Dutch, MD;  Location: [place] [facility];  Service: Cardiovascular;  Laterality:  Bilateral;  . PERIPHERAL VASCULAR INTERVENTION Bilateral 04/08/2016   Procedure: Peripheral Vascular Intervention;  Surgeon: Elam Dutch, MD;  Location: [place] [facility];  Service: Cardiovascular;  Laterality: Bilateral;  common iliac  . TOTAL HIP ARTHROPLASTY Left 01/29/2016  . VEIN HARVEST Left 04/12/2016   Procedure: LEFT GREATER SAPHENOUS VEIN HARVEST;  Surgeon: Conrad Maxwell, MD;  Location: [facility] OR;  Service: Vascular;  Laterality: Left;    Assessment & Plan Clinical Impression: Patient is a 74 y.o. year old female with history of COPD, A fib- no AC, PAD, anxiety d/o who was originally admitted 08/10/19 via [facility] with HA, left inattention and difficulty walking due to acute hemorrhage in paramedian left cerebellar hemisphere with small volume SAH UDS positive for opiates. 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 Dr. Zada Finders consulted for input and felt that no surgical intervention needed but to repeat MRI brain w/ w/o contrast when able due to uncommon location of hemorrhage.   She was started on Cleveprex for BP management and noted to have leucocytosis at admission with WBC-18.2.  She developed fever later that evening due to E coli UTI and treated with IV Rocephin. She also had issues with urinary retention requiring I/O caths. MBS done revealing aspiration of thins and she was started on dysphagia 3, nectar liquids. BP medications were slowly being resumed and she was cleared for intensive rehab program on 07/06. BP continued to be labile and patient reported worsening  of headache.  She developed hypoxia early am of 07/08 with stats in 70's due to fluid overload and had improvement with IV diuresis. She was noted to be lethargic and has increase in weakness with decrease in mentation.   CT head was ordered due to  concerns of extension and showed evolution of R-PCA infarct involving right parasagittal temporal lobe and right occipital lobe with mild associated mass effect and unchanged inferior L-IPH in left cerebellar hemisphere with small amount of vasogenic edema.  She was transferred to acute floor for workup and monitoring. She was found to have rise in troponin's to 920 and EKG showed ST changes in lateral leads felt to bed due to NSTEMI. Patient without chest pain and not felt to be a candidate for intervention. HCTZ added to manage pulmonary edema per Dr. Doylene Canard. Electrolyte abnormalities have resolved with IV magnesium as well as addition of Kdur. Dr. Leonie Man felt that patient with expected evolutionary changes and that  Hypoxia with hypertensive emergency accounted for increase in left sided deficits--and to would improve as parameters optimized.    She was started on IV ceftriaxone and transitioned to cefdinir to complete 7 total day antibiotic regimen. Medical issues have been optimized and she was cleared to resume CIR course.  Therapy has been resumed and patient with deficits in awareness, memory, attention and executive function.  Noted to have left lean without ability to self correct and needs assist to sit EOB. Limited by fatigue and SOB and able to stand in [place] with max assist X 2. Patient transferred to CIR on 08/19/2019 .   Patient currently requires +2 total assist with mobility secondary to muscle weakness and muscle joint tightness, decreased cardiorespiratoy endurance and decreased oxygen support, impaired timing and sequencing, abnormal tone, unbalanced muscle activation, motor apraxia, decreased coordination and decreased motor planning, decreased visual perceptual skills and decreased visual motor skills, decreased attention to left and left side neglect, decreased initiation, decreased attention, decreased awareness, decreased problem solving, decreased safety awareness, decreased memory and  delayed processing and decreased sitting balance, decreased standing balance, decreased postural control and decreased balance strategies.  Prior to hospitalization, patient was modified independent  with mobility and lived with Son in a House home.  Home access is 3Stairs to enter.  Patient will benefit from skilled PT intervention to maximize safe functional mobility, minimize fall risk and decrease caregiver burden for planned discharge home with 24 hour assist.  Anticipate patient will benefit from follow up [facility] at discharge.  PT - End of Session Activity Tolerance: Tolerates 30+ min activity with multiple rests Endurance Deficit: Yes Endurance Deficit Description: pt lethargic and requesting to return to supine and rest/sleep PT Assessment Rehab Potential (ACUTE/IP ONLY): Fair PT Barriers to Discharge: [place] home environment;Home environment access/layout;Incontinence;Neurogenic Bowel & Bladder PT Patient demonstrates impairments in the following area(s): Balance;Perception;Behavior;Safety;Edema;Sensory;Skin Integrity;Endurance;Motor;Nutrition;Pain PT Transfers Functional Problem(s): Bed Mobility;Bed to Chair;Car;Furniture PT Locomotion Functional Problem(s): Ambulation;Wheelchair Mobility;Stairs PT Plan PT Intensity: Minimum of 1-2 x/day ,45 to 90 minutes PT Frequency: 5 out of 7 days PT Duration Estimated Length of Stay: ~4weeks PT Treatment/Interventions: Ambulation/gait training;Community reintegration;DME/adaptive equipment instruction;Neuromuscular re-education;Psychosocial support;Stair training;UE/LE Strength taining/ROM;Wheelchair propulsion/positioning;Balance/vestibular training;Discharge planning;Pain management;Functional electrical stimulation;Skin care/wound management;Therapeutic Activities;UE/LE Coordination activities;Cognitive remediation/compensation;Disease management/prevention;Functional mobility training;Patient/family education;Splinting/orthotics;Therapeutic  Exercise;Visual/perceptual remediation/compensation PT Transfers Anticipated Outcome(s): min assist PT Locomotion Anticipated Outcome(s): TBD if pt wil be a functional ambulator - will update LTGs as appropriat PT Recommendation Follow Up Recommendations: Home health PT;24 hour supervision/assistance Patient destination: Home Equipment Recommended: To  be determined  Skilled Therapeutic Intervention Evaluation completed (see details above and below) with education on PT POC and goals and individual treatment initiated with focus on bed mobility, midline orientation, L attention, motor planning, trunk control/sitting balance, and progression to standing as well as pt education regarding daily therapy schedule, weekly team meetings, purpose of PT evaluation, and other CIR information. Pt received in a deep sleep, supine in bed requiring increased time and loud verbal stimulus to awaken. Upon awakening agreeable to therapy session but then repeatedly states during session she wants to go back to sleep and rest - able to be redirected and agreeable to continue participating. Pt received and maintained on 3L of O2 via nasal cannula during session. Demos significant R gaze preference only able to get pt to track to midline. Supine>sitting R EOB with max assist for L hemibody management and trunk upright - requires max cuing for sequencing and for increased independence. Sitting EOB, demos significant L anterior trunk lean/LOB with pt demonstrating some pusher tendencies with R UE extension and R hip/pelvic elevation - requires total assist to maintain upright - able to perform R trunk lean onto forearm support and maintain that static position with mod assist for trunk control. Attempted sitting EOB trunk control/midline orientation via visual feedback to align trunk with therapist but pt unable to correct continuing to require total assist to prevent L anterior LOB. Sit>supine with max assist for L hemibody  management and LE management into bed. Therapist went to retrieve wheelchair but no TIS w/c available at this time that is an appropriate size for patient - retrieved Roho cushion for once able to transfer OOB to promote increased pressure relief and improved upright, OOB activity tolerance. Upon return to room pt still awake and agreeable to continue session. Supine>sitting R EOB as described above. Sit<>stand EOB<>R HHA and L UE over therapist's shoulders 2x with +2 max assist for lifting into standing - max assist of 1 & mod assist of 2nd person for static standing balance - once in standing therapist blocking L knee buckle (demos some quad activation) and multimodal cuing for increased trunk/hip extension to improve upright posture as pt stays slightly flexed forward. Returned to supine as described above - left in bed with needs in reach, [facility] elevated >30degrees, and bed alarm on.  PT Evaluation Precautions/Restrictions Precautions Precautions: Fall;Other (comment) Precaution Comments: supplemental O2, L side inattention, L visual field deficits, left side sensory deficits, left hemiparesis, pusher Restrictions Weight Bearing Restrictions: No Pain Pain Assessment Pain Scale: 0-10 Pain Score: 0-No pain Home Living/Prior Functioning Home Living Available Help at Discharge: Family;Available 24 hours/day Type of Home: House Home Access: Stairs to enter [employer] of Steps: 3 Entrance Stairs-Rails: Right;Left Home Layout: Two level;Able to live on main level with bedroom/bathroom;1/2 bath on main level Bathroom Shower/Tub: Tub/shower unit (upstairs, pt only sponge bathed) Bathroom Toilet: Standard Bathroom Accessibility: No  Lives With: Son Prior Function Level of Independence: Requires assistive device for independence  Able to Take Stairs?: Yes Driving: No Vocation: Retired Pension scheme manager Range of Motion: Impaired-to be further tested in  functional context (decreased tracking to the left of midline) Alignment/Gaze Preference: Gaze right Perception Perception: Impaired Inattention/Neglect: Does not attend to left side of body;Does not attend to left visual field Spatial Orientation: impaired midline orientation with pusher tendencies Praxis Praxis: Impaired Praxis Impairment Details: Ideomotor;Motor planning Praxis-Other Comments: Pt with decreased ability to complete digit flexion and extension to command when therapist asked  even though some of these two movements are available  Cognition Overall Cognitive Status: Impaired/Different from baseline Arousal/Alertness: Lethargic Orientation Level: Oriented X4 Attention: Focused;Sustained Focused Attention: Impaired Sustained Attention: Impaired Awareness: Impaired Problem Solving: Impaired Safety/Judgment: Impaired Sensation Sensation Light Touch: Impaired Detail Light Touch Impaired Details: Absent LUE;Absent LLE Hot/Cold: Not tested Proprioception: Impaired Detail Proprioception Impaired Details: Absent LUE;Absent LLE Stereognosis: Not tested Stereognosis Impaired Details: Absent LUE Additional Comments: Pt unable to detect light touch, deep pressure, or proprioception in the left arm or leg Coordination Gross Motor Movements are Fluid and Coordinated: No Fine Motor Movements are Fluid and Coordinated: No Coordination and Movement Description: impaired due to L hemipareiss, absent L UE and L LE sensation, impaired midline orientaiton, poor trunk control, and L inattention Heel Shin Test: impaired on L LE due to paresis and impaired proprioception Motor  Motor Motor: Hemiplegia;Abnormal postural alignment and control;Motor apraxia;Other (comment) Motor - Skilled Clinical Observations: LUE and LLE hemiparesis with decreased coordination noted with attempted functional use as well as abesnt sensation  Mobility Bed Mobility Bed Mobility: Rolling Right;Rolling  Left;Right Sidelying to Sit;Sit to Sidelying Right Rolling Right: Maximal Assistance - Patient 25-49% Rolling Left: Moderate Assistance - Patient 50-74% Right Sidelying to Sit: Maximal Assistance - Patient 25-49%;Total Assistance - Patient < 25% Sit to Sidelying Right: Maximal Assistance - Patient 25-49% Transfers Transfers: Stand to Sit;Sit to Stand Sit to Stand: 2 Helpers (+2 max assist) Stand to Sit: 2 Helpers (+2 max assist) Transfer (Assistive device): None Locomotion  Gait Ambulation: No Gait Gait: No Stairs / Additional Locomotion Stairs: No Wheelchair Mobility Wheelchair Mobility: No  Trunk/Postural Assessment  Cervical Assessment Cervical Assessment: Exceptions to [facility] (excessive cervical fleixon, excessive R cervical rotation with R gaze preference) Thoracic Assessment Thoracic Assessment: Exceptions to [facility] (significant thoracic kyphosis) Lumbar Assessment Lumbar Assessment: Exceptions to [facility] (posterior pelvic tilt in sitting) Postural Control Postural Control: Deficits on evaluation Trunk Control: impaired midline orientation and poor trunk control with pusher tendencies causing strong L anterior lean/LOB Righting Reactions: absent Protective Responses: significantly impaired and inadequate Postural Limitations: significantly decreased  Balance Balance Balance Assessed: Yes Static Sitting Balance Static Sitting - Balance Support: Feet supported;Right upper extremity supported Static Sitting - Level of Assistance: 1: +1 Total assist Dynamic Sitting Balance Dynamic Sitting - Balance Support: Feet supported Dynamic Sitting - Level of Assistance: 1: +1 Total assist Static Standing Balance Static Standing - Balance Support: Right upper extremity supported Static Standing - Level of Assistance: 1: +2 Total assist Dynamic Standing Balance Dynamic Standing - Balance Support: During functional activity Dynamic Standing - Level of Assistance: 1: +2 Total  assist Extremity Assessment      RLE Assessment RLE Assessment: Exceptions to [facility] Active Range of Motion (AROM) Comments: WFL General Strength Comments: pt with difficulty following commands to perform strength assessment - demonstrates at least the below MMT scores RLE Strength Right Hip Flexion: 3+/5 Right Knee Flexion: 3+/5 Right Knee Extension: 4-/5 Right Ankle Dorsiflexion: 3+/5 Right Ankle Plantar Flexion: 3+/5 LLE Assessment LLE Assessment: Exceptions to [facility] General Strength Comments: difficulty following commands for strength assessment LLE Strength Left Hip Flexion: 3-/5 Left Knee Flexion: 2+/5 Left Knee Extension: 3-/5 Left Ankle Dorsiflexion: 2+/5 Left Ankle Plantar Flexion: 2+/5 LLE Tone LLE Tone: Hypotonic;Mild    Refer to Care Plan for Long Term Goals  Recommendations for other services: None at this time  Discharge Criteria: Patient will be discharged from PT if patient refuses treatment 3 consecutive times without medical reason, if treatment goals  not met, if there is a change in medical status, if patient makes no progress towards goals or if patient is discharged from hospital.  The above assessment, treatment plan, treatment alternatives and goals were discussed and mutually agreed upon: by patient  Tawana Scale , PT, DPT, CSRS  08/20/2019, 12:13 PM

## 2019-08-20 NOTE — Progress Notes (Signed)
Inpatient Rehabilitation Care Coordinator Assessment and Plan  Patient Details  Name: Caitlin Vasquez MRN: [id number] Date of Birth: 09-10-1945  Today's Date: 08/20/2019  Problem List:  Patient Active Problem List   Diagnosis Date Noted  . Acute pulmonary edema (HCC) 08/19/2019  . Extension of stroke (HCC) 08/15/2019  . Altered mental status 08/15/2019  . Hyperglycemia 08/14/2019  . Dyslipidemia 08/14/2019  . On home oxygen therapy 08/13/2019  . Atrial fibrillation (HCC) 08/13/2019  . Hypertensive emergency 08/13/2019  . E-coli UTI 08/13/2019  . Hypokalemia 08/13/2019  . Aortic atherosclerosis (HCC) 08/13/2019  . Cerebellar hemorrhage (HCC) 08/13/2019  . 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 Atherosclerosis of native artery of left lower extremity with gangrene (HCC) 05/20/2016  . Malnutrition of moderate degree 04/11/2016  . Acute left systolic heart failure (HCC) 04/06/2016  . Exertional dyspnea 11/28/2014  . COPD with chronic bronchitis and emphysema (HCC) 11/28/2014  . Chest pain at rest 11/28/2014  . Essential hypertension 11/28/2014   Past Medical History:  Past Medical History:  Diagnosis Date  . COPD (chronic obstructive pulmonary disease) (HCC)   . High cholesterol   . On home oxygen therapy    "2L; all the time" (04/06/2016)  . Pneumonia    "when I was a kid"   . Urinary hesitancy    Past Surgical History:  Past Surgical History:  Procedure Laterality Date  . ABDOMINAL AORTOGRAM N/A 04/08/2016   Procedure: Abdominal Aortogram;  Surgeon: Sherren Kerns, MD;  [facility] INVAS[facility] CV LAB;  Service: Cardiovascular;  Laterality: N/A;  . CARDIAC CATHETERIZATION N/A 11/28/2014   Procedure: Right/Left Heart Cath and Coronary Angiography;  Surgeon: [name], MD;  Location: [facility] INVASIVE CV LAB;  Service: Cardiovascular;  Laterality: N/A;  . CARDIAC CATHETERIZATION   08/2006   Hattie Perch 08/25/2006  . DRESSING CHANGE UNDER ANESTHESIA Left 04/12/2016   Procedure: DRESSING CHANGE LEFT HIP AND LEFT HEEL;  Surgeon: Fransisco Hertz, MD;  Location: [facility] OR;  Service: Vascular;  Laterality: Left;  . FEMORAL-POPLITEAL BYPASS GRAFT Left 04/12/2016   Procedure: LEFT FEMORAL-POPITEAL  BYPASS GRAFT;  Surgeon: Fransisco Hertz, MD;  Location: [facility] OR;  Service: Vascular;  Laterality: Left;  . JOINT REPLACEMENT    . LOWER EXTREMITY ANGIOGRAPHY Bilateral 04/08/2016   Procedure: Lower Extremity Angiography;  Surgeon: Sherren Kerns, MD;  Location: [facility] INVASIVE CV LAB;  Service: Cardiovascular;  Laterality: Bilateral;  . PERIPHERAL VASCULAR INTERVENTION Bilateral 04/08/2016   Procedure: Peripheral Vascular Intervention;  Surgeon: Sherren Kerns, MD;  Location: [facility] INVASIVE CV LAB;  Service: Cardiovascular;  Laterality: Bilateral;  common iliac  . TOTAL HIP ARTHROPLASTY Left 01/29/2016  . VEIN HARVEST Left 04/12/2016   Procedure: LEFT GREATER SAPHENOUS VEIN HARVEST;  Surgeon: Fransisco Hertz, MD;  Location: [facility] OR;  Service: Vascular;  Laterality: Left;   Social History:  reports that she quit smoking about 5 years ago. Her smoking use included cigarettes. She has a 55.00 pack-year smoking history. She has never used smokeless tobacco. She reports that she does not drink alcohol and does not use drugs.  Family / Support Systems Marital Status: Single Children: 2 sons (josh and justin) Anticipated Caregiver: Lives with Retail buyer and Daughter in [place] Ability/Limitations of Caregiver: none Caregiver Availability: 24/7  Social History Preferred language: English Religion: Quaker Read: Yes Write: Yes   Abuse/Neglect Abuse/Neglect Assessment Can Be Completed: Yes Physical Abuse: Denies  Verbal Abuse: Denies Sexual Abuse: Denies Exploitation of patient/patient's resources: Denies Self-Neglect: Denies  Emotional Status Pt's affect, behavior and adjustment status: no Recent Psychosocial Issues:  no Psychiatric History: no Substance Abuse History: no  Patient / Family Perceptions, Expectations & Goals Pt/Family understanding of illness & functional limitations: yes- patient states son is aware Pt/family expectations/goals: Goal to dishcharge home with son  [employer]: None Premorbid Home Care/DME Agencies: None Transportation available at discharge: family able to transport  Discharge Planning Living Arrangements: Children Support Systems: Children Type of Residence: Private residence (2 level home. Able to stay on main floor with bedroom and bath. 3 steps to enter) Insurance Resources: Medicare Living Expenses: Lives with family Money Management: Patient, Family Does the patient have any problems obtaining your medications?: No Care Coordinator Anticipated Follow Up Needs: HH/OP  Clinical Impression Sw entered room, introduced self, explain role and process. Patient would like information provided to son, sw followed up- no answer. Will continue to attempt son. Sw will continue to follow up with questions and concerns,  Andria Rhein 08/20/2019, 1:36 PM

## 2019-08-20 NOTE — Plan of Care (Signed)
  Problem: RH BOWEL ELIMINATION Goal: RH STG MANAGE BOWEL WITH ASSISTANCE Description: STG Manage Bowel with mod Assistance. Outcome: Progressing Goal: RH STG MANAGE BOWEL W/MEDICATION W/ASSISTANCE Description: STG Manage Bowel with Medication with mod I Assistance. Outcome: Progressing   Problem: RH BLADDER ELIMINATION Goal: RH STG MANAGE BLADDER WITH ASSISTANCE Description: STG Manage Bladder With mod Assistance Outcome: Progressing

## 2019-08-20 NOTE — Evaluation (Signed)
Occupational Therapy Assessment and Plan  Patient Details  Name: Caitlin Vasquez MRN: [id number] Date of Birth: 1945-09-30  OT Diagnosis: abnormal posture, cognitive deficits, disturbance of vision, hemiplegia affecting non-dominant side and muscle weakness (generalized) Rehab Potential: Rehab Potential (ACUTE ONLY): Good ELOS: 24-28 days   Today's Date: 08/20/2019 OT Individual Time: 5573-2202 OT Individual Time Calculation (min): 66 min     Hospital Problem: Principal Problem:   Cerebellar hemorrhage ([place])   Past Medical History:  Past Medical History:  Diagnosis Date  . COPD (chronic obstructive pulmonary disease) ([place])   . High cholesterol   . On home oxygen therapy    "2L; all the time" (04/06/2016)  . Pneumonia    "when I was a kid"   . Urinary hesitancy    Past Surgical History:  Past Surgical History:  Procedure Laterality Date  . ABDOMINAL AORTOGRAM N/A 04/08/2016   Procedure: Abdominal Aortogram;  Surgeon: Elam Dutch, MD;  Location: [facility];  Service: Cardiovascular;  Laterality: N/A;  . CARDIAC CATHETERIZATION N/A 11/28/2014   Procedure: Right/Left Heart Cath and Coronary Angiography;  Surgeon: Dixie Dials, MD;  Location: [facility];  Service: Cardiovascular;  Laterality: N/A;  . CARDIAC CATHETERIZATION  08/2006   Archie Endo 08/25/2006  . DRESSING CHANGE UNDER ANESTHESIA Left 04/12/2016   Procedure: DRESSING CHANGE LEFT HIP AND LEFT HEEL;  Surgeon: Conrad Winfield, MD;  Location: [facility];  Service: Vascular;  Laterality: Left;  . FEMORAL-POPLITEAL BYPASS GRAFT Left 04/12/2016   Procedure: LEFT FEMORAL-POPITEAL  BYPASS GRAFT;  Surgeon: Conrad Taft [place], MD;  Location: [facility];  Service: Vascular;  Laterality: Left;  . JOINT REPLACEMENT    . LOWER EXTREMITY ANGIOGRAPHY Bilateral 04/08/2016   Procedure: Lower Extremity Angiography;  Surgeon: Elam Dutch, MD;  Location: [facility];  Service: Cardiovascular;  Laterality: Bilateral;  . PERIPHERAL VASCULAR  INTERVENTION Bilateral 04/08/2016   Procedure: Peripheral Vascular Intervention;  Surgeon: Elam Dutch, MD;  Location: [facility];  Service: Cardiovascular;  Laterality: Bilateral;  common iliac  . TOTAL HIP ARTHROPLASTY Left 01/29/2016  . VEIN HARVEST Left 04/12/2016   Procedure: LEFT GREATER SAPHENOUS VEIN HARVEST;  Surgeon: Conrad Sholes, MD;  Location: [facility];  Service: Vascular;  Laterality: Left;    Assessment & Plan Clinical Impression: Patient is a 74 y.o. year old female with recent admission to the hospital on  08/10/19 via [facility] with HA, left inattention and difficulty walking due to acute hemorrhage in paramedian left cerebellar hemisphere with small volume SAH UDS positive for opiates. 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 Dr. Zada Finders consulted for input and felt that no surgical intervention needed but to repeat MRI brain w/ w/o contrast when able due to uncommon location of hemorrhage.  She was started on Cleveprex for BP management and noted to have leucocytosis at admission with WBC-18.2.  She developed fever later that evening due to E coli UTI and treated with IV Rocephin. She also had issues with urinary retention requiring I/O caths. MBS done revealing aspiration of thins and she was started on dysphagia 3, nectar liquids. BP medications were slowly being resumed and she was cleared for intensive rehab program on 07/06. BP continued to be labile and patient reported worsening of headache.  She developed hypoxia early am  of 07/08 with stats in 70's due to fluid overload and had improvement with IV diuresis. She was noted to be lethargic and has increase in weakness with decrease in mentation. CT head was ordered due to concerns of extension and showed evolution of R-PCA infarct involving right  parasagittal temporal lobe and right occipital lobe with mild associated mass effect and unchanged inferior L-IPH in left cerebellar hemisphere with small amount of vasogenic edema.    Patient transferred to CIR on 08/19/2019 .    Patient currently requires total with basic self-care skills secondary to muscle weakness, decreased cardiorespiratoy endurance, impaired timing and sequencing, unbalanced muscle activation, motor apraxia, decreased coordination and decreased motor planning, decreased visual motor skills and field cut, decreased midline orientation, decreased attention to left, left side neglect and decreased motor planning, decreased attention, decreased awareness, decreased problem solving, decreased safety awareness and decreased memory and decreased sitting balance, decreased standing balance, decreased postural control, hemiplegia and decreased balance strategies.  Prior to hospitalization, patient could complete ADLs with modified independent .  Patient will benefit from skilled intervention to decrease level of assist with basic self-care skills and increase independence with basic self-care skills prior to discharge home with care partner.  Anticipate patient will require moderate physical assestance and follow up home health.  OT - End of Session Activity Tolerance: Improving Endurance Deficit: Yes Endurance Deficit Description: Pt fatigued, wanting to lay back down after sitting up for less than 5 mins EOB. OT Assessment Rehab Potential (ACUTE ONLY): Good OT Patient demonstrates impairments in the following area(s): Balance;Cognition;Endurance;Motor;Vision;Sensory;Safety;Perception OT Basic ADL's Functional Problem(s): Eating;Grooming;Bathing;Toileting;Dressing OT Transfers Functional Problem(s): Toilet OT Additional Impairment(s): Fuctional Use of Upper Extremity OT Plan OT Intensity: Minimum of 1-2 x/day, 45 to 90 minutes OT Frequency: 5 out of 7 days OT Duration/Estimated  Length of Stay: 24-28 days OT Treatment/Interventions: Balance/vestibular training;Cognitive remediation/compensation;Community reintegration;DME/adaptive equipment instruction;Disease mangement/prevention;Discharge planning;Functional electrical stimulation;Functional mobility training;Neuromuscular re-education;Psychosocial support;Patient/family education;Pain management;Self Care/advanced ADL retraining;Splinting/orthotics;UE/LE Strength taining/ROM;Therapeutic Exercise;Therapeutic Activities;UE/LE Coordination activities;Visual/perceptual remediation/compensation;Wheelchair propulsion/positioning OT Self Feeding Anticipated Outcome(s): supervision OT Basic Self-Care Anticipated Outcome(s): min to mod assist OT Toileting Anticipated Outcome(s): min assist OT Bathroom Transfers Anticipated Outcome(s): min asssit OT Recommendation Recommendations for Other Services: Neuropsych consult;Therapeutic Recreation consult Therapeutic Recreation Interventions: Stress management Patient destination: Home Follow Up Recommendations: Home health OT;24 hour supervision/assistance Equipment Recommended: To be determined   Skilled Therapeutic Intervention Pt worked on selfcare retraining EOB with son present to assist with session.  She needed max assist for supine to sit as well as total assist for sitting balance during selfcare tasks secondary to severe left and forward lean with inability to correct without total physical assist.  When therapist would help her to sit back upright, she would still continually be flexing her trunk forward so that her legs would come up into flexion slightly with trunk correction to upright posture.  Decreased overall endurance as well with pt requesting to transfer back to supine after sitting approximately 3-5 mins.  She needed max instructional cueing throughout bathing task for scanning to midline and slightly left as her head and gaze were to the right.  Transitioned back to  supine with rolling for LB selfcare after short interval of standing with total assist +2 (pt 30%) to step up closer to the [facility].  Total assist in supine for doffing brief, washing buttocks, and donning brief and pants.  Educated pt's son and pt on expectations of rehab with overall min to mod assist goals likely and 24 hr  assist.  He states that he will be able to provide this.  Also educated pt and son on LUE positioning for protection on pillows when in the bed.   OT Evaluation Precautions/Restrictions  Precautions Precautions: Fall Precaution Comments: supplemental O2, L sided inattention, L visual field deficits, left side sensory deficits, left hemiparesis Restrictions Weight Bearing Restrictions: No  Pain Pain Assessment Pain Score: 0-No pain Home Living/Prior Functioning Home Living Family/patient expects to be discharged to:: Private residence Living Arrangements: Children Available Help at Discharge: Family, Available 24 hours/day Type of Home: House Home Access: Stairs to enter Technical brewer of Steps: 3 Entrance Stairs-Rails: Right, Left Home Layout: Two level, Able to live on main level with bedroom/bathroom, 1/2 bath on main level Bathroom Shower/Tub: Tub/shower unit (upstairs, pt only sponge bathed) Bathroom Toilet: Standard Bathroom Accessibility: No  Lives With: Son IADL History Homemaking Responsibilities: No Current License: No Education: 9th grade Occupation: Retired Prior Function Level of Independence: Requires assistive device for independence Driving: No Vocation: Retired ADL ADL Eating: Minimal assistance Where Assessed-Eating: Bed level Grooming: Minimal assistance Where Assessed-Grooming: Bed level Upper Body Bathing: Maximal assistance Where Assessed-Upper Body Bathing: Edge of bed Lower Body Bathing: Dependent Where Assessed-Lower Body Bathing: Edge of bed, Bed level Upper Body Dressing: Dependent Where Assessed-Upper Body Dressing:  Edge of bed Lower Body Dressing: Dependent Where Assessed-Lower Body Dressing: Edge of bed Toileting: Dependent Where Assessed-Toileting: Bedside Commode Toilet Transfer: Dependent Toilet Transfer Method: Engineer, water: Bedside commode Vision Baseline Vision/History: Wears glasses Wears Glasses: At all times Patient Visual Report: No change from baseline Vision Assessment?: Vision impaired- to be further tested in functional context Ocular Range of Motion: Impaired-to be further tested in functional context (decreased tracking to the left of midline) Alignment/Gaze Preference: Gaze right Visual Fields: Left visual field deficit Perception  Perception: Impaired Inattention/Neglect: Does not attend to left side of body;Does not attend to left visual field Praxis Praxis: Impaired Praxis Impairment Details: Ideomotor Praxis-Other Comments: Pt with decreased ability to complete digit flexion and extension to command when therapist asked even though some of these two movements are available Cognition Overall Cognitive Status: Impaired/Different from baseline Arousal/Alertness: Awake/alert Orientation Level: Person;Place;Situation Person: Oriented Place: Oriented Situation: Oriented Year: 2021 Month: July Day of Week: Incorrect (thursday) Memory: Impaired Memory Impairment: Decreased recall of new information Immediate Memory Recall: Sock;Blue;Bed Memory Recall Sock: Without Cue Memory Recall Blue: Without Cue Memory Recall Bed: Without Cue Attention: Focused;Sustained Focused Attention: Appears intact Sustained Attention: Impaired Sustained Attention Impairment: Verbal basic;Functional basic Awareness: Impaired Awareness Impairment: Intellectual impairment Problem Solving: Impaired Problem Solving Impairment: Functional basic;Verbal basic Behaviors: Restless Safety/Judgment: Impaired Sensation Sensation Light Touch: Impaired Detail Light Touch  Impaired Details: Absent LUE;Absent LLE Hot/Cold: Not tested Proprioception: Impaired Detail Proprioception Impaired Details: Absent LUE;Absent LLE Stereognosis: Impaired Detail Stereognosis Impaired Details: Absent LUE Additional Comments: Pt unable to detect light touch, deep pressure, or proprioception in the left arm. Coordination Gross Motor Movements are Fluid and Coordinated: No Fine Motor Movements are Fluid and Coordinated: No Coordination and Movement Description: Pt with Brunnstrum stage IV movements in the left arm and hand, but needs mod to max assist for functional use with bathing tasks secondary to decreased coordination as well as lack of sensation. Motor  Motor Motor: Hemiplegia;Abnormal postural alignment and control;Motor apraxia Motor - Skilled Clinical Observations: LUE and LLE hemiparesis with decreased coordination noted with attempted functional use as well as decreased sensory feedback. Mobility  Bed Mobility Bed Mobility: Rolling Right;Rolling Left;Right  Sidelying to Sit;Sit to Sidelying Right Rolling Right: Maximal Assistance - Patient 25-49% Rolling Left: Minimal Assistance - Patient > 75% Right Sidelying to Sit: Maximal Assistance - Patient 25-49% Sit to Sidelying Right: Maximal Assistance - Patient 25-49% Transfers Sit to Stand: 2 Helpers  Trunk/Postural Assessment  Cervical Assessment Cervical Assessment: Exceptions to [facility] (flexed head with rotation to the right) Thoracic Assessment Thoracic Assessment: Exceptions to [facility] (severe thoracic kyphosis) Lumbar Assessment Lumbar Assessment: Exceptions to [facility] (increased lumbar flexion in sitting and standing with posterior pelvic tilt as well) Postural Control Postural Control: Deficits on evaluation Trunk Control: LOB forward and to the left in sitting  Balance Balance Balance Assessed: Yes Static Sitting Balance Static Sitting - Balance Support: Feet supported;Right upper extremity supported Static  Sitting - Level of Assistance: 1: +1 Total assist Dynamic Sitting Balance Dynamic Sitting - Balance Support: Feet supported;During functional activity Dynamic Sitting - Level of Assistance: 1: +1 Total assist Static Standing Balance Static Standing - Balance Support: Right upper extremity supported Static Standing - Level of Assistance: 1: +1 Total assist Dynamic Standing Balance Dynamic Standing - Balance Support: During functional activity Dynamic Standing - Level of Assistance: 1: +2 Total assist Extremity/Trunk Assessment RUE Assessment RUE Assessment: Within Functional Limits Active Range of Motion (AROM) Comments: WFLS General Strength Comments: AROM WFLs with strength not formally tested during eval.  She was able to use the UE [facility] for selfcare tasks. LUE Assessment LUE Assessment: Exceptions to [facility] Passive Range of Motion (PROM) Comments: PROM WFLS for all joints with slight flexor tone noted initially in the digits General Strength Comments: Pt currently with Brunnstrum stage IV movement in the arm and hand.  Decreased control noted with max assist needed for integrated use into bathing tasks secondary to sensory loss and apraxia.     Refer to Care Plan for Long Term Goals  Recommendations for other services: Neuropsych and Therapeutic Recreation  Stress management   Discharge Criteria: Patient will be discharged from OT if patient refuses treatment 3 consecutive times without medical reason, if treatment goals not met, if there is a change in medical status, if patient makes no progress towards goals or if patient is discharged from hospital.  The above assessment, treatment plan, treatment alternatives and goals were discussed and mutually agreed upon: by patient and by family  Geniene List OTR/L  08/20/2019, 4:36 PM

## 2019-08-20 NOTE — Care Management (Signed)
Patient ID: Caitlin Vasquez, female   DOB: 06/06/1945, 74 y.o.   MRN: [id number]  Met with the patient to review role of CM and collaboration with SW Margreta Journey) to facilitate preparation for discharge. Reviewed recent stroke and secondary stroke prevention interventions including management of HLD, HTN. Reviewed hx of urinary issues, and currently requiring I+O caths for retention. Also currently using O2 @ 3l/min [place] with congested breathing. Reported son does food prep and reports they eat take out or tacos, sausage biscuit with gravy, etc.. Discussed low sodium, heart healthy options.  Patient has MASD and skin issues. No other concerns noted at present.  Margarito Liner

## 2019-08-20 NOTE — Progress Notes (Addendum)
[facility] PHYSICAL MEDICINE & REHABILITATION PROGRESS NOTE   Subjective/Complaints: Mrs. Winkles has no complaints this morning. She appears more alert today. Feels fatigued Does not remember working with therapy yesterday.   ROS: denies pain, constipation, and insomnia.  Objective:   DG Chest 2 View  Result Date: 08/19/2019 CLINICAL DATA:  74 year old female with shortness of breath. History of COPD. Former smoker. EXAM: CHEST - 2 VIEW COMPARISON:  Chest radiograph dated 08/14/2019. FINDINGS: There is background of emphysema, chronic interstitial coarsening and bronchitic changes. No focal consolidation, pleural effusion, or pneumothorax. The cardiac silhouette is within limits. Atherosclerotic calcification of the aorta. Osteopenia with degenerative changes of the spine. No acute osseous pathology. Several radiopaque foci in the left abdomen, likely diverticuliths. IMPRESSION: No active cardiopulmonary disease. Electronically Signed   By: Elgie Collard M.D.   On: 08/19/2019 19:55   Recent Labs    08/20/19 0535  WBC 9.6  HGB 13.3  HCT 41.4  PLT 312   Recent Labs    08/20/19 0535  NA 137  K 4.3  CL 98  CO2 28  GLUCOSE 109*  BUN 24*  CREATININE 1.06*  CALCIUM 8.8*    Intake/Output Summary (Last 24 hours) at 08/20/2019 1424 Last data filed at 08/20/2019 0815 Gross per 24 hour  Intake 150 ml  Output 1800 ml  Net -1650 ml     Physical Exam: Vital Signs Blood pressure 128/61, pulse 69, temperature 98.2 F (36.8 C), temperature source Oral, resp. rate 20, SpO2 97 %.  General: No apparent distress Nose: [place] in place Eyes:     Comments: Crusty drainage on both eyelids.  Neck: Supple without JVD or lymphadenopathy Heart: Reg rate and rhythm. No murmurs rubs or gallops Pulmonary:     Breath sounds: Rhonchi present.     Comments: Intermittent congested cough noted.  Musculoskeletal:     Cervical back: No rigidity.  Neurological:     Mental Status: She is oriented  to person, place, and time and easily aroused. She is more alert today, but still fatigued    Comments: Needed cues to stay awake--"tired". Left facial weakness with raspy, congested voice. Right gaze preference with left inattention and was unable to move eyes beyond midline. Needed max cues to attend to left and had poor awareness of deficits. Left sided weakness with left sensory deficits.   Psych: Pt's affect is more alert today. Pt is cooperative  Assessment/Plan: 1. Functional deficits secondary to cerebellar hemorrhage which require 3+ hours per day of interdisciplinary therapy in a comprehensive inpatient rehab setting.  Physiatrist is providing close team supervision and 24 hour management of active medical problems listed below.  Physiatrist and rehab team continue to assess barriers to discharge/monitor patient progress toward functional and medical goals  Care Tool:  Bathing              Bathing assist       Upper Body Dressing/Undressing Upper body dressing        Upper body assist      Lower Body Dressing/Undressing Lower body dressing            Lower body assist       Toileting Toileting    Toileting assist Assist for toileting: Dependent - Patient 0%     Transfers Chair/bed transfer  Transfers assist           Locomotion Ambulation   Ambulation assist  Walk 10 feet activity   Assist           Walk 50 feet activity   Assist           Walk 150 feet activity   Assist           Walk 10 feet on uneven surface  activity   Assist           Wheelchair     Assist               Wheelchair 50 feet with 2 turns activity    Assist            Wheelchair 150 feet activity     Assist          Blood pressure 128/61, pulse 69, temperature 98.2 F (36.8 C), temperature source Oral, resp. rate 20, SpO2 97 %.  Medical Problem List and Plan: 1.  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  Antithrombotics: -DVT/anticoagulation:  Mechanical: Sequential compression devices, below knee Bilateral lower extremities             -antiplatelet therapy: N/a due to bleed.  3. Pain Management: Back pain resolved with treatment of UTI. Discontinue hydrocodone and will use tramadol or tylenol prn for now.  4. Anxiety disorder/Mood: Per patient/son was using ? Medication for anxiety.              -antipsychotic agents: N/a 5. Neuropsych: This patient is not fully capable of making decisions on her own behalf. 6. Skin/Wound Care:  Routine pressure relief measures.  7. Fluids/Electrolytes/Nutrition: Strict I/O. Check labs in am. 8. NSTEMI with fluid overload: Treated medically with Coreg, Lisinopril and Lipitor.  9. Severe COPD: Encourage pulmonary hygiene--will order flutter. Wean oxygen to off as able as has not use it for past year per son. Albuterol MDI prn. Will order CXR as continues to have SOB with activity.   10. E coli UTI: Has completed 7 day antibiotic course on 07/11.  11. HTN: SBP goal <160. Monitor BP tid--on Norvasc and HCTZ. BP continues to be much better controlled now off Labetalol. 12. Lethargy: Multifactorial? Received hydrocodone X 2 yesterday and Ambien last night. Will change. More alert today 13. Urinary retention: Continue monitor voiding with PVR checks/bladder scans 4-6 hours and I/O caht to keep volumes <300 cc.  14. A fib: Not AC candidate due to hemorrhage. Rate controlled 15. Dysphagia: On dysphagia 3 diet with nectar liquids.  16. Tachypnea: RR down to 20. CXR reviewed and stable.      LOS: 1 days A FACE TO FACE EVALUATION WAS PERFORMED  Drema Pry Chereese Cilento 08/20/2019, 2:24 PM

## 2019-08-20 NOTE — Progress Notes (Signed)
[facility]patient Rehabilitation Center Individual Statement of Services  Patient Name:  Caitlin Vasquez  Date:  08/20/2019  Welcome to the Inpatient Rehabilitation Center.  Our goal is to provide you with an individualized program based on your diagnosis and situation, designed to meet your specific needs.  With this comprehensive rehabilitation program, you will be expected to participate in at least 3 hours of rehabilitation therapies Monday-Friday, with modified therapy programming on the weekends.  Your rehabilitation program will include the following services:  Physical Therapy (PT), Occupational Therapy (OT), Speech Therapy (ST), 24 hour per day rehabilitation nursing, Therapeutic Recreaction (TR), Neuropsychology, Care Coordinator, Rehabilitation Medicine, Nutrition Services, Pharmacy Services and Other  Weekly team conferences will be held on Wednesdays to discuss your progress.  Your Inpatient Rehabilitation Care Coordinator will talk with you frequently to get your input and to update you on team discussions.  Team conferences with you and your family in attendance may also be held.  Expected length of stay: 16-20 Days  Overall anticipated outcome: MOD A to Supervision  Depending on your progress and recovery, your program may change. Your Inpatient Rehabilitation Care Coordinator will coordinate services and will keep you informed of any changes. Your Inpatient Rehabilitation Care Coordinator's name and contact numbers are listed  below.  The following services may also be recommended but are not provided by the Inpatient Rehabilitation Center:    Home Health Rehabiltiation Services  Outpatient Rehabilitation Services    Arrangements will be made to provide these services after discharge if needed.  Arrangements include referral to agencies that provide these services.  Your insurance has been verified to be:  Medicare Your primary doctor is:  [name], MD  Pertinent information  will be shared with your doctor and your insurance company.  Inpatient Rehabilitation Care Coordinator:  Lavera Guise, [place] [phone] or [phone]  Information discussed with and copy given to patient by: Andria Rhein, 08/20/2019, 11:17 AM

## 2019-08-21 ENCOUNTER — Inpatient Hospital Stay (HOSPITAL_COMMUNITY): Payer: Medicare Other

## 2019-08-21 ENCOUNTER — Inpatient Hospital Stay (HOSPITAL_COMMUNITY): Payer: Medicare Other | Admitting: Occupational Therapy

## 2019-08-21 ENCOUNTER — Inpatient Hospital Stay (HOSPITAL_COMMUNITY): Payer: Medicare Other | Admitting: Physical Therapy

## 2019-08-21 ENCOUNTER — Inpatient Hospital Stay (HOSPITAL_COMMUNITY): Payer: Medicare Other | Admitting: Speech Pathology

## 2019-08-21 LAB — GLUCOSE, CAPILLARY
Glucose-Capillary: 102 mg/dL — ABNORMAL HIGH (ref 70–99)
Glucose-Capillary: 134 mg/dL — ABNORMAL HIGH (ref 70–99)
Glucose-Capillary: 89 mg/dL (ref 70–99)
Glucose-Capillary: 130 mg/dL — ABNORMAL HIGH (ref 70–99)

## 2019-08-21 MED ORDER — BUSPIRONE HCL 5 MG PO TABS
5.0000 mg | ORAL_TABLET | Freq: Two times a day (BID) | ORAL | Status: DC
  Administered 2019-08-21 – 2019-08-24 (×7): 5 mg via ORAL
  Filled 2019-08-21 (×7): qty 1

## 2019-08-21 MED ORDER — MIRTAZAPINE 15 MG PO TABS
7.5000 mg | ORAL_TABLET | Freq: Every day | ORAL | Status: DC
  Administered 2019-08-21 – 2019-09-11 (×22): 7.5 mg via ORAL
  Filled 2019-08-21 (×22): qty 1

## 2019-08-21 MED ORDER — TAMSULOSIN HCL 0.4 MG PO CAPS
0.4000 mg | ORAL_CAPSULE | Freq: Every day | ORAL | Status: DC
  Administered 2019-08-21 – 2019-09-11 (×22): 0.4 mg via ORAL
  Filled 2019-08-21 (×22): qty 1

## 2019-08-21 NOTE — Progress Notes (Signed)
Oral care provided by NT after breakfast. Crack was noted in bottom dentures. This nurse asked the patient about the crack in them. Patient stated "her dentures been cracked for years and they're a mess." Kalman Shan, LPN

## 2019-08-21 NOTE — Progress Notes (Signed)
Physical Therapy Session Note  Patient Details  Name: Caitlin Vasquez MRN: [id number] Date of Birth: 1946-01-30  Today's Date: 08/21/2019 PT Individual Time: 1106-1200 PT Individual Time Calculation (min): 54 min   Short Term Goals: Week 1:  PT Short Term Goal 1 (Week 1): Pt will perform supine<>sit with mod assist PT Short Term Goal 2 (Week 1): Pt will be able to maintain midline orientation in sitting for 6mnute with no more than min assist of 1 PT Short Term Goal 3 (Week 1): Pt will perform sit<>stand with +2 mod assist PT Short Term Goal 4 (Week 1): Pt will perform bed<>chair transfers with +2 max assist  Skilled Therapeutic Interventions/Progress Updates: Pt presented in bed with son present. Pt sleeping and required extensive stimulation including sternal rub and use of iced washcloth to arouse pt. Pt required total A to roll to R and maxA supine to sit at EOB. Pt noted to have heavy lean to due to pushing. PTA was able to place pt's hand on her lap and pt was able to sustain midline with minA for 2-3 seconds then placed hand back on  and total A to transfer to w/c. Pt transported to ortho gym and participated in Dynav[employer] Mode A with pt averaging 29 sec with max multimodal cues. Pt attempted again for 5 min duration however after approx 3 min unable to sustain attention and required redirection to task. Pt demonstrated increased lethargy during session thus pt transported to rehab gym. PTA attempted to have pt scan card and required max cues to have pt open eyes. Pt stating the card was 2 diamonds (was 3 diamonds). Pt then falling asleep. PTA transported pt back to room total A. Performed squat pivot transfer back to bed maxA x 2 due to pt's lethargy. Pt repositioned to comfort and left in bed sleeping with alarm on, call bell within reach and needs met.      Therapy Documentation Precautions:  Precautions Precautions: Fall, Other (comment) Precaution Comments: supplemental O2, L side  inattention, L visual field deficits, left side sensory deficits, left hemiparesis, pusher Restrictions Weight Bearing Restrictions: No LLE Weight Bearing: Non weight bearing General:   Vital Signs: Therapy Vitals Temp: 98.3 F (36.8 C) Pulse Rate: 64 Resp: (!) 24 BP: (!) 123/57 Patient Position (if appropriate): Lying Oxygen Therapy SpO2: 99 % O2 Device: Nasal Cannula O2 Flow Rate (L/min): 2 L/min Pain: Pain Assessment Pain Scale: 0-10 Pain Score: 0-No pain Mobility:   Locomotion :    Trunk/Postural Assessment :    Balance:   Exercises:   Other Treatments:      Therapy/Group: Individual Therapy  Rahmir Beever 08/21/2019, 5:16 PM

## 2019-08-21 NOTE — Plan of Care (Signed)
  Problem: Consults Goal: RH STROKE PATIENT EDUCATION Description: See Patient Education module for education specifics  Outcome: Progressing   Problem: RH BOWEL ELIMINATION Goal: RH STG MANAGE BOWEL WITH ASSISTANCE Description: STG Manage Bowel with mod Assistance. Outcome: Progressing Goal: RH STG MANAGE BOWEL W/MEDICATION W/ASSISTANCE Description: STG Manage Bowel with Medication with mod I Assistance. Outcome: Progressing   Problem: RH BLADDER ELIMINATION Goal: RH STG MANAGE BLADDER WITH ASSISTANCE Description: STG Manage Bladder With mod Assistance Outcome: Progressing   Problem: RH SKIN INTEGRITY Goal: RH STG SKIN FREE OF INFECTION/BREAKDOWN Description: No additional breakdown will occur while iin rehab with mod assistance  Outcome: Progressing Goal: RH STG MAINTAIN SKIN INTEGRITY WITH ASSISTANCE Description: STG Maintain Skin Integrity With mod Assistance. Outcome: Progressing   Problem: RH SAFETY Goal: RH STG ADHERE TO SAFETY PRECAUTIONS W/ASSISTANCE/DEVICE Description: STG Adhere to Safety Precautions With mod Assistance/Device. Outcome: Progressing   Problem: RH PAIN MANAGEMENT Goal: RH STG PAIN MANAGED AT OR BELOW PT'S PAIN GOAL Description: Pt will report pain of less than 4 with min assistance Outcome: Progressing   Problem: RH KNOWLEDGE DEFICIT Goal: RH STG INCREASE KNOWLEDGE OF HYPERTENSION Description: Family will review stroke book and be able to identify factors related to hypertension with min assist Outcome: Progressing Goal: RH STG INCREASE KNOWLEDGE OF STROKE PROPHYLAXIS Description: Family will be able to identify stroke prophylaxis with min assist Outcome: Progressing   Problem: RH KNOWLEDGE DEFICIT Goal: RH STG INCREASE KNOWLEGDE OF HYPERLIPIDEMIA Description: Family will be able to identify factors related to HLD  and secondary stroke, dietary restrictions and medication management for with min assist  Outcome: Progressing   Problem: RH  Vision Goal: RH LTG Vision (Specify) Outcome: Progressing   

## 2019-08-21 NOTE — Progress Notes (Signed)
[facility] PHYSICAL MEDICINE & REHABILITATION PROGRESS NOTE   Subjective/Complaints: No issues overnite except poor sleep.  Required ICP.    ROS: denies pain, constipation, and insomnia.  Objective:   DG Chest 2 View  Result Date: 08/19/2019 CLINICAL DATA:  74 year old female with shortness of breath. History of COPD. Former smoker. EXAM: CHEST - 2 VIEW COMPARISON:  Chest radiograph dated 08/14/2019. FINDINGS: There is background of emphysema, chronic interstitial coarsening and bronchitic changes. No focal consolidation, pleural effusion, or pneumothorax. The cardiac silhouette is within limits. Atherosclerotic calcification of the aorta. Osteopenia with degenerative changes of the spine. No acute osseous pathology. Several radiopaque foci in the left abdomen, likely diverticuliths. IMPRESSION: No active cardiopulmonary disease. Electronically Signed   By: Elgie Collard M.D.   On: 08/19/2019 19:55   Recent Labs    08/20/19 0535  WBC 9.6  HGB 13.3  HCT 41.4  PLT 312   Recent Labs    08/20/19 0535  NA 137  K 4.3  CL 98  CO2 28  GLUCOSE 109*  BUN 24*  CREATININE 1.06*  CALCIUM 8.8*    Intake/Output Summary (Last 24 hours) at 08/21/2019 0805 Last data filed at 08/21/2019 0650 Gross per 24 hour  Intake 460 ml  Output 1500 ml  Net -1040 ml     Physical Exam: Vital Signs Blood pressure (!) 142/72, pulse 75, temperature 97.8 F (36.6 C), temperature source Oral, resp. rate 18, SpO2 100 %.  General: No apparent distress Nose: [place] in place Eyes:     sclera clear  General: No acute distress Mood and affect are appropriate Heart: Regular rate and rhythm no rubs murmurs or extra sounds Lungs: Clear to auscultation, breathing unlabored, no rales or wheezes Abdomen: Positive bowel sounds, soft nontender to palpation, nondistended Extremities: No clubbing, cyanosis, or edema Skin: No evidence of breakdown, no evidence of rash Neurologic: Cranial nerves II through XII  intact, motor strength is 5/5 in right , 4/5 left  deltoid, bicep, tricep, grip, hip flexor, knee extensors, ankle dorsiflexor and plantar flexor Sensory exam normal sensation to light touch and proprioception in right and absent left  upper and lower extremities Cerebellar exam normal finger to nose to finger as well as heel to shin in bilateral upper and lower extremities Musculoskeletal: Full range of motion in all 4 extremities. No joint swelling  Assessment/Plan: 1. Functional deficits secondary to cerebellar hemorrhage which require 3+ hours per day of interdisciplinary therapy in a comprehensive inpatient rehab setting.  Physiatrist is providing close team supervision and 24 hour management of active medical problems listed below.  Physiatrist and rehab team continue to assess barriers to discharge/monitor patient progress toward functional and medical goals  Care Tool:  Bathing    Body parts bathed by patient: Chest, Abdomen, Front perineal area, Left arm, Right upper leg, Left upper leg, Face (sit to supine)   Body parts bathed by helper: Right lower leg, Left lower leg, Right arm, Buttocks     Bathing assist Assist Level: 2 Helpers     Upper Body Dressing/Undressing Upper body dressing Upper body dressing/undressing activity did not occur (including orthotics): N/A What is the patient wearing?: Pull over shirt    Upper body assist Assist Level: Total Assistance - Patient < 25%    Lower Body Dressing/Undressing Lower body dressing    Lower body dressing activity did not occur: N/A What is the patient wearing?: Incontinence brief, Pants     Lower body assist Assist for lower body dressing:  Total Assistance - Patient < 25%     Toileting Toileting    Toileting assist Assist for toileting: Dependent - Patient 0%     Transfers Chair/bed transfer  Transfers assist  Chair/bed transfer activity did not occur: N/A        Locomotion Ambulation   Ambulation  assist   Ambulation activity did not occur: Safety/medical concerns          Walk 10 feet activity   Assist  Walk 10 feet activity did not occur: Safety/medical concerns        Walk 50 feet activity   Assist Walk 50 feet with 2 turns activity did not occur: Safety/medical concerns         Walk 150 feet activity   Assist Walk 150 feet activity did not occur: Safety/medical concerns         Walk 10 feet on uneven surface  activity   Assist Walk 10 feet on uneven surfaces activity did not occur: Safety/medical concerns         Wheelchair     Assist Will patient use wheelchair at discharge?:  (TBD)             Wheelchair 50 feet with 2 turns activity    Assist            Wheelchair 150 feet activity     Assist          Blood pressure (!) 142/72, pulse 75, temperature 97.8 F (36.6 C), temperature source Oral, resp. rate 18, SpO2 100 %.  Medical Problem List and Plan: 1.  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  Antithrombotics: -DVT/anticoagulation:  Mechanical: Sequential compression devices, below knee Bilateral lower extremities             -antiplatelet therapy: N/a due to bleed.  3. Pain Management: Back pain resolved with treatment of UTI. Discontinue hydrocodone and will use tramadol or tylenol prn for now.  4. Anxiety disorder/Mood: Per patient/son was using ? Medication for anxiety.              -antipsychotic agents: N/a 5. Neuropsych: This patient is not fully capable of making decisions on her own behalf. 6. Skin/Wound Care:  Routine pressure relief measures.  7. Fluids/Electrolytes/Nutrition: Strict I/O. Check labs in am. 8. NSTEMI with fluid overload: Treated medically with Coreg, Lisinopril and Lipitor.  9. Severe COPD: Encourage pulmonary hygiene--will order  flutter. Wean oxygen to off as able as has not use it for past year per son. Albuterol MDI prn. Will order CXR as continues to have SOB with activity.   10. E coli UTI: Has completed 7 day antibiotic course on 07/11.  11. HTN: SBP goal <160. Monitor BP tid--on Norvasc and HCTZ. BP continues to be much better controlled now off Labetalol. Vitals:   08/20/19 2001 08/21/19 0632  BP: (!) 115/56 (!) 142/72  Pulse: 67 75  Resp: 20 18  Temp: 97.8 F (36.6 C) 97.8 F (36.6 C)  SpO2: 99% 100%  trial flomax for urinary retention, monitor BP 12. Lethargy: Multifactorial? Received hydrocodone X 2 yesterday and Ambien last night. Will change. More alert today 13. Urinary retention: Continue monitor voiding with PVR checks/bladder  scans 4-6 hours and I/O caht to keep volumes <300 cc.  14. A fib: Not AC candidate due to hemorrhage. Rate controlled 15. Dysphagia: On dysphagia 3 diet with nectar liquids.  16. Tachypnea: RR down to 20. CXR reviewed and stable.      LOS: 2 days A FACE TO FACE EVALUATION WAS PERFORMED  Erick Colace 08/21/2019, 8:05 AM

## 2019-08-21 NOTE — IPOC Note (Signed)
Overall Plan of Care [facility]) Patient Details Name: KOBI ALLER MRN: [id number] DOB: 1945-09-09  Admitting Diagnosis: Cerebellar hemorrhage [facility])  Hospital Problems: Principal Problem:   Cerebellar hemorrhage (HCC)     Functional Problem List: Nursing Bladder, Bowel, Endurance, Skin Integrity  PT Balance, Perception, Behavior, Safety, Edema, Sensory, Skin Integrity, Endurance, Motor, Nutrition, Pain  OT Balance, Cognition, Endurance, Motor, Vision, Radiation protection practitioner, Perception  SLP Cognition  TR         Basic ADL's: OT Eating, Grooming, Bathing, Toileting, Dressing     Advanced  ADL's: OT       Transfers: PT Bed Mobility, Bed to Chair, Car, Oncologist: PT Ambulation, Psychologist, prison and probation services, Stairs     Additional Impairments: OT Fuctional Use of Upper Extremity  SLP Swallowing, Social Cognition   Attention, Awareness  TR      Anticipated Outcomes Item Anticipated Outcome  Self Feeding supervision  Swallowing  Supervision A   Basic self-care  min to mod assist  Toileting  min assist   Bathroom Transfers min asssit  Bowel/Bladder  Pt will regain continence of bladder and bowel while in rehab  Transfers  min assist  Locomotion  TBD if pt wil be a functional ambulator - will update LTGs as appropriat  Communication     Cognition  Supervision A  Pain  pain will remain less than 4  Safety/Judgment  pt will be free from falls   Therapy Plan: PT Intensity: Minimum of 1-2 x/day ,45 to 90 minutes PT Frequency: 5 out of 7 days PT Duration Estimated Length of Stay: ~4weeks OT Intensity: Minimum of 1-2 x/day, 45 to 90 minutes OT Frequency: 5 out of 7 days OT Duration/Estimated Length of Stay: 24-28 days SLP Intensity: Minumum of 1-2 x/day, 30 to 90 minutes SLP Frequency: 3 to 5 out of 7 days SLP Duration/Estimated Length of Stay: 16-20 days   Due to the current state of emergency, patients may not be receiving their 3-hours of  Medicare-mandated therapy.   Team Interventions: Nursing Interventions Patient/Family Education, Skin Care/Wound Management, Discharge Planning, Bladder Management, Bowel Management  PT interventions Ambulation/gait training, Community reintegration, DME/adaptive equipment instruction, Neuromuscular re-education, Psychosocial support, Stair training, UE/LE Strength taining/ROM, Wheelchair propulsion/positioning, Warden/ranger, Discharge planning, Pain management, Functional electrical stimulation, Skin care/wound management, Therapeutic Activities, UE/LE Coordination activities, Cognitive remediation/compensation, Disease management/prevention, Functional mobility training, Patient/family education, Splinting/orthotics, Therapeutic Exercise, Visual/perceptual remediation/compensation  OT Interventions Warden/ranger, Cognitive remediation/compensation, Community reintegration, Fish farm manager, Disease mangement/prevention, Discharge planning, Functional electrical stimulation, Functional mobility training, Neuromuscular re-education, Psychosocial support, Patient/family education, Pain management, Self Care/advanced ADL retraining, Splinting/orthotics, UE/LE Strength taining/ROM, Therapeutic Exercise, Therapeutic Activities, UE/LE Coordination activities, Visual/perceptual remediation/compensation, Wheelchair propulsion/positioning  SLP Interventions Cognitive remediation/compensation, Financial trader, Dysphagia/aspiration precaution training, Patient/family education, Internal/external aids, Functional tasks  TR Interventions    SW/CM Interventions Discharge Planning, Psychosocial Support, Patient/Family Education   Barriers to Discharge MD  Medical stability and New oxygen  Nursing      PT Inaccessible home environment, Home environment access/layout, Incontinence, Neurogenic Bowel & Bladder    OT      SLP      SW       Team Discharge  Planning: Destination: PT-Home ,OT- Home , SLP-Home Projected Follow-up: PT-Home health PT, 24 hour supervision/assistance, OT-  Home health OT, 24 hour supervision/assistance, SLP-24 hour supervision/assistance, Home Health SLP, Outpatient SLP Projected Equipment Needs: PT-To be determined, OT- To be determined, SLP-None recommended by SLP Equipment Details:  PT- , OT-  Patient/family involved in discharge planning: PT- Patient,  OT-Patient, Family member/caregiver, SLP-Patient, Family member/caregiver  MD ELOS: 14-18d Medical Rehab Prognosis:  Good Assessment:  74 year old female with history of COPD, A fib- no AC, PAD, anxiety d/o who was originally admitted 08/10/19 via RH with HA, left inattention and difficulty walking due to acute hemorrhage in paramedian left cerebellar hemisphere with small volume SAH UDS positive for opiates. 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 Dr. Maurice Small consulted for input and felt that no surgical intervention needed but to repeat MRI brain w/ w/o contrast when able due to uncommon location of hemorrhage.   She was started on Cleveprex for BP management and noted to have leucocytosis at admission with WBC-18.2.  She developed fever later that evening due to E coli UTI and treated with IV Rocephin. She also had issues with urinary retention requiring I/O caths. MBS done revealing aspiration of thins and she was started on dysphagia 3, nectar liquids. BP medications were slowly being resumed and she was cleared for intensive rehab program on 07/06. BP continued to be labile and patient reported worsening of headache.  She developed hypoxia early am of 07/08 with stats in 70's due to fluid overload and had improvement with IV diuresis. She was noted to be lethargic and has increase in  weakness with decrease in mentation.   CT head was ordered due to concerns of extension and showed evolution of R-PCA infarct involving right parasagittal temporal lobe and right occipital lobe with mild associated mass effect and unchanged inferior L-IPH in left cerebellar hemisphere with small amount of vasogenic edema.  She was transferred to acute floor for workup and monitoring. She was found to have rise in troponin's to 920 and EKG showed ST changes in lateral leads felt to bed due to NSTEMI. Patient without chest pain and not felt to be a candidate for intervention. HCTZ added to manage pulmonary edema per Dr. Algie Coffer. Electrolyte abnormalities have resolved with IV magnesium as well as addition of Kdur. Dr. Pearlean Brownie felt that patient with expected evolutionary changes and that  Hypoxia with hypertensive emergency accounted for increase in left sided deficits--and to would improve as parameters optimized.    She was started on IV ceftriaxone and transitioned to cefdinir to complete 7 total day antibiotic regimen. Medical issues have been optimized and she was cleared to resume CIR course   See Team Conference Notes for weekly updates to the plan of care  Now requiring 24/7 Rehab RN,MD, as well as CIR level PT, OT and SLP.  Treatment team will focus on ADLs and mobility with goals set at [facility]

## 2019-08-21 NOTE — Progress Notes (Signed)
Occupational Therapy Session Note  Patient Details  Name: Caitlin Vasquez MRN: [id number] Date of Birth: 12-16-1945  Today's Date: 08/21/2019 OT Individual Time: 8242-3536 OT Individual Time Calculation (min): 62 min    Short Term Goals: Week 1:  OT Short Term Goal 1 (Week 1): Pt will maintain static sitting balance for 3 mins EOB with min assist in preparation for selfcare tasks. OT Short Term Goal 2 (Week 1): Pt will complete UB bathing in supported sitting with no more than min assist for two consecutive sessions. OT Short Term Goal 3 (Week 1): Pt will locate grooming and bathing items placed left of midline with no more than mod instructional cueing. OT Short Term Goal 4 (Week 1): Pt will donn a pullover shirt with mod assist following hemi dressing techniques.  Skilled Therapeutic Interventions/Progress Updates:    Pt in bed to start session, agreeable to work on therapy this am.  Began with donning of pants in supine rolling side to side.  She was able to complete rolling to the right side with min assist and then needed max assist to roll to the right side with max instructional cueing for sequencing task.  She needed total assist for transfer from sidelying to sitting with total assist for static sitting balance.  She completed squat pivot transfer to the wheelchair to the left with total assist.  Had her work on grooming tasks at the sink for combing hair and applying her dentures.  Mod assist with max instructional cueing for thoroughness with combing her hair.  Incorporated visual scanning to the left side for location of dentures in therapist's hand in order to place in her mouth with supervision. Completed sit to stand and standing at the sink with total assist as well with increased pushing to the left and increased trunk and cervical flexion.  She was unable to maintain for more than 3-5 seconds.  Finished session with transfer from the wheelchair to the bed with total assist to the  right.  Finished session with pt in sidelying on her left side and call button and phone in reach.  Bed alarm in place as well.   Therapy Documentation Precautions:  Precautions Precautions: Fall, Other (comment) Precaution Comments: supplemental O2, L side inattention, L visual field deficits, left side sensory deficits, left hemiparesis, pusher Restrictions Weight Bearing Restrictions: No LLE Weight Bearing: Non weight bearing  Pain: Pain Assessment Pain Scale: Faces Faces Pain Scale: Hurts a little bit Pain Type: Chronic pain Pain Location: Back Pain Orientation: Lower Pain Descriptors / Indicators: Discomfort Pain Onset: With Activity ADL: See Care Tool Section for some details of mobility and selfcare  Therapy/Group: Individual Therapy  Aquila Menzie OTR/L 08/21/2019, 10:21 AM

## 2019-08-21 NOTE — Patient Care Conference (Signed)
Inpatient RehabilitationTeam Conference and Plan of Care Update Date: 08/21/2019   Time: 2:01 PM    Patient Name: Caitlin Vasquez      Medical Record Number: [id number]  Date of Birth: 28-Jun-1945 Sex: Female         Room/Bed: 4W16C/4W16C-01 Payor Info: Payor: MEDICARE / Plan: MEDICARE PART A AND B / Product Type: *No Product type* /    Admit Date/Time:  08/19/2019  3:23 PM  Primary Diagnosis:  Cerebellar hemorrhage [facility])  Hospital Problems: Principal Problem:   Cerebellar hemorrhage [facility])    Expected Discharge Date: Expected Discharge Date: 09/12/19  Team Members Present: Physician leading conference: Dr. Claudette Laws Care Coodinator Present: Chana Bode, RN, BSN, CRRN;Christina Vita Barley, BSW Nurse Present: Doran Durand, LPN PT Present: Casimiro Needle, PT OT Present: Perrin Maltese, OT SLP Present: Suzzette Righter, CF-SLP PPS Coordinator present : Fae Pippin, SLP     Current Status/Progress Goal Weekly Team Focus  Bowel/Bladder   Pt is incontinent of bowel and has the inability to urinate.  To become continent with bowel and regain the ability to urinate.  Assess tolieting needs at least every 2 hours.   Swallow/Nutrition/ Hydration   Dys 3 and nectar thick liquids, Full supervision A to assist with set up/locating items and check for oral clearance/pocketing  Supervision A  swallow startegies, trials of thin liquids and regular textures   ADL's   Total assist for unsupported UB selfcare EOB.  Total assist for LB selfcare supine to sit.  total assist +2 for standing as well as for toileting tasks  min to mod assist  selfcare retraining, transfer training, neuromuscular re-education, balance retraining, visual compensation treatment, pt/family education, DME education   Mobility   heavy max assist supine<>sit, total assist sitting balance EOB due to L anterior trunk lean with pusher tendencies, heavy +2 max assist for sit<>stands  min assist overall  evaluation, pt  education, bed mobility, sitting balance/trunk control, L attention, midline orientation, sit<>stand transfer training   Communication             Safety/Cognition/ Behavioral Observations  Total A intellectual awareness, Max A left inattention, sustained attention in 10 minute intervals, max-mod A basic problem solving  Supervision A  intellectual awareness, left inattention/scanning during functional tasks and sustained attention   Pain   No complaints of pain  To remain pain free.  Assess pain q shift or prn.   Skin   Has unstageable to buttocks, has medicated cream to apply. Has skin tear to left elbow area with foam over, and has some redness to the left lower leg anteriorly.  To promote healing and prevent further breakdown.  Assess skin q shift or prn.     Team Discussion:  Discharge Planning/Teaching Needs:  Goal to discharge home with son and DTR in law  Will schedule with family   Current Update: on target  Current Barriers to Discharge:  Decreased caregiver support and Incontinence  Requiring intermittent catheterizations   Possible Resolutions to Barriers: Intermittent catheterizations Toileting protocol Skin care regimen for MASD to buttocks  Patient on target to meet rehab goals: yes, poor endurance left neglect and poor proprioception limits progress.  *See Care Plan and progress notes for long and short-term goals.   Revisions to Treatment Plan:  Changed to 15/7 therapy schedule SLP downgraded goals from supervision level    Medical Summary Current Status: pulm status stable, hx opioid use, anxiety mainly  at noc Weekly Focus/Goal: trial of meds for anxiety, will  try to avoid benzos given hx of non prescribed opioid use         Continued Need for Acute Rehabilitation Level of Care: The patient requires daily medical management by a physician with specialized training in physical medicine and rehabilitation for the following reasons: Direction of a  multidisciplinary physical rehabilitation program to maximize functional independence : Yes Medical management of patient stability for increased activity during participation in an intensive rehabilitation regime.: Yes Analysis of laboratory values and/or radiology reports with any subsequent need for medication adjustment and/or medical intervention. : Yes   I attest that I was present, lead the team conference, and concur with the assessment and plan of the team.   Chana Bode B 08/21/2019, 2:01 PM

## 2019-08-21 NOTE — Progress Notes (Signed)
Speech Language Pathology Daily Session Note  Patient Details  Name: Caitlin Vasquez MRN: [id number] Date of Birth: Aug 04, 1945  Today's Date: 08/21/2019 SLP Individual Time: 1415-1455 SLP Individual Time Calculation (min): 40 min  Short Term Goals: Week 1: SLP Short Term Goal 1 (Week 1): Pt will scan left of midline during functional tasks with mod A verbal and visual cues. SLP Short Term Goal 2 (Week 1): Pt will demonstrate sustained attention in 10 minute intervals during functional tasks with mod A verbal cues for redirection, SLP Short Term Goal 3 (Week 1): Pt will idenitfy 1 swallow or cognitive and 1 phsyical deficits from acute injury with max A verbal cues. SLP Short Term Goal 4 (Week 1): Pt will consume dys 3 textures and nectar thick liquids with supervision A verbal cues for swallow strategies. SLP Short Term Goal 5 (Week 1): Pt will participate in trials of thin liquids with minimal s/s aspirtation and vital signs remaining WFL prior to repeat instrumental swallow study. SLP Short Term Goal 6 (Week 1): Pt will consume regular textures with appropriate oral clearance given mod A verbal cues for use of swallow strategies.  Skilled Therapeutic Interventions: Pt was seen for skilled ST targeting cognitive goals. SLP facilitated session with functional tasks targeting visual scanning, sustained attention, and basic problem solving. Pt required Min A verbal cues for redirection to tasks. She did require rest breaks due to fatigue throughout session. She required Mod A verbal and visual cues for problem solving and visual scanning to read and interpret information from a basic weekly weather forecast. Use of a visual anchor and tactile assistance to use pt's finger to track along was somewhat helpful with visual scanning. She could not locate therapist sitting on left side of environment. Pt demonstrated how to use call bell to request assistance, but Moderate question cues to enhance awareness  and recall safety precautions required for her to verbalize situations in which she may need to request assistance. Pt also required Mod A verbal and visual cues for problem solving functional use of her TV remote function. Pt left laying in bed with alarm set and needs within reach. Continue per current plan of care.        Pain Pain Assessment Pain Scale: 0-10 Pain Score: 0-No pain  Therapy/Group: Individual Therapy  Caitlin Vasquez 08/21/2019, 7:15 AM

## 2019-08-21 NOTE — Progress Notes (Signed)
Patient ID: Caitlin Vasquez, female   DOB: 1945-07-15, 74 y.o.   MRN: [id number]  Sw attempted to call son to provide team conference updates for the week. No answer, will continue to follow up

## 2019-08-22 ENCOUNTER — Inpatient Hospital Stay (HOSPITAL_COMMUNITY): Payer: Medicare Other | Admitting: Occupational Therapy

## 2019-08-22 ENCOUNTER — Inpatient Hospital Stay (HOSPITAL_COMMUNITY): Payer: Medicare Other | Admitting: Physical Therapy

## 2019-08-22 ENCOUNTER — Inpatient Hospital Stay (HOSPITAL_COMMUNITY): Payer: Medicare Other

## 2019-08-22 ENCOUNTER — Inpatient Hospital Stay (HOSPITAL_COMMUNITY): Payer: Medicare Other | Admitting: Speech Pathology

## 2019-08-22 DIAGNOSIS — I614 Nontraumatic intracerebral hemorrhage in cerebellum: Secondary | ICD-10-CM

## 2019-08-22 LAB — GLUCOSE, CAPILLARY
Glucose-Capillary: 117 mg/dL — ABNORMAL HIGH (ref 70–99)
Glucose-Capillary: 111 mg/dL — ABNORMAL HIGH (ref 70–99)
Glucose-Capillary: 112 mg/dL — ABNORMAL HIGH (ref 70–99)
Glucose-Capillary: 107 mg/dL — ABNORMAL HIGH (ref 70–99)

## 2019-08-22 MED ORDER — LEVALBUTEROL HCL 0.63 MG/3ML IN NEBU
0.6300 mg | INHALATION_SOLUTION | Freq: Three times a day (TID) | RESPIRATORY_TRACT | Status: DC
  Administered 2019-08-23: 0.63 mg via RESPIRATORY_TRACT
  Filled 2019-08-22: qty 3

## 2019-08-22 NOTE — Progress Notes (Signed)
Speech Language Pathology Daily Session Note  Patient Details  Name: Caitlin Vasquez MRN: [id number] Date of Birth: [birth year]  Today's Date: 08/22/2019 SLP Individual Time: 4098-1191 SLP Individual Time Calculation (min): 43 min  Short Term Goals: Week 1: SLP Short Term Goal 1 (Week 1): Pt will scan left of midline during functional tasks with mod A verbal and visual cues. SLP Short Term Goal 2 (Week 1): Pt will demonstrate sustained attention in 10 minute intervals during functional tasks with mod A verbal cues for redirection, SLP Short Term Goal 3 (Week 1): Pt will idenitfy 1 swallow or cognitive and 1 phsyical deficits from acute injury with max A verbal cues. SLP Short Term Goal 4 (Week 1): Pt will consume dys 3 textures and nectar thick liquids with supervision A verbal cues for swallow strategies. SLP Short Term Goal 5 (Week 1): Pt will participate in trials of thin liquids with minimal s/s aspirtation and vital signs remaining WFL prior to repeat instrumental swallow study. SLP Short Term Goal 6 (Week 1): Pt will consume regular textures with appropriate oral clearance given mod A verbal cues for use of swallow strategies.  Skilled Therapeutic Interventions: Pt was seen for skilled ST targeting dysphagia and cognitive goals. SLP provided trials of upgraded thin H2O after oral care; pt consumed ~4oz with 1 immediate cough response and occasionally multiple swallows were noted. During consumption of addition 4 oz of nectar thick juice, no overt s/sx aspiration observed. Would recommend continue current diet for now and ST will continue thin trials to prepare for repeat MBSS likely to occur next week. Interventions for memory focused on recall of functional daily information. Pt required Max A verbal cues to recall whether or not she had taken anxiety medicine this morning; required multiple cues throughout session to recall this information. Mod A question cues to recall activities of earlier  PT session. During a basic color sorting task (6 total colors) pt required Max A mainly verbal cues to locate colored items left of midline. She sorted them with 90% accuracy with Min A verbal and visual cues provided for error awareness. Pt left in bed with alarm set and needs within reach. Continue per current plan of care.           Pain Pain Assessment Pain Scale: 0-10 Pain Score: 0-No pain  Therapy/Group: Individual Therapy  Little Ishikawa 08/22/2019, 7:24 AM

## 2019-08-22 NOTE — Plan of Care (Signed)
Plan of Care  Pt's plan of care adjusted to 15/7 after speaking with care team and discussed with MD in team conference as pt unable to tolerate current therapy schedule with OT, PT, and SLP.   Casimiro Needle, PT, DPT, CSRS

## 2019-08-22 NOTE — Progress Notes (Signed)
VASCULAR LAB PRELIMINARY  PRELIMINARY  PRELIMINARY  PRELIMINARY  Bilateral lower extremity venous duplex completed.    Preliminary report:  See CV proc for preliminary results.  Izora Benn, RVT 08/22/2019, 6:13 PM

## 2019-08-22 NOTE — Progress Notes (Signed)
Physical Therapy Session Note  Patient Details  Name: Caitlin Vasquez MRN: [id number] Date of Birth: 1945/11/12  Today's Date: 08/22/2019 PT Individual Time: 8242-3536 and 1710-1733 PT Individual Time Calculation (min): 58 min and 23 min  Short Term Goals: Week 1:  PT Short Term Goal 1 (Week 1): Pt will perform supine<>sit with mod assist PT Short Term Goal 2 (Week 1): Pt will be able to maintain midline orientation in sitting for with no more than min assist of 1 PT Short Term Goal 3 (Week 1): Pt will perform sit<>stand with +2 mod assist PT Short Term Goal 4 (Week 1): Pt will perform bed<>chair transfers with +2 max assist  Skilled Therapeutic Interventions/Progress Updates:    Session 1: Pt received supine in bed with NT present to assist pt with breakfast. Pt agreeable to therapy session. Pt received and maintained on 1L of O2 via nasal cannula. Therapist assumed care of patient and incorporated visual scanning and L attention into eating task via having pt place drink, napkins, etc. on the L side then visually scan to relocate those items when needed - requires max/total cuing to reorient to L side. Consumed meal with max cuing for alternating solids/liquids as needed and no signs of aspiration. RN in/out for morning medications and pt noted to have difficulty swallowing larger pills whole in puree [name], SLP notified. Supine>sitting R EOB with mod/max assist for L hemibody management and trunk upright with pt demoing improved initiation of task and increased L hemibody activation during task. Pt presents with increased L UE extensor tone sitting EOB today. Sitting EOB demos improving trunk control (decreased L anterior lean) requiring mod assist of 1 to position R UE in abducted position to decrease pushing then is able to maintain midline orientation with improved upright trunk posture, min assist for ~26minutes before requiring mod assist to reposition - visual and verbal cuing for  midline orientation. Pt occasionally will spontaneously initiate R sidelying due to fatigue requesting to rest. While sitting EOB +2 assist performed hair brushing that resulted in very slight internal perturbations as pt rotated her head causing minor trunk sway as a balance challenge for patient requiring min/mod assist. MD in/out for morning assessment - noted pt with increased labored breathing throughout this session - SpO2 99% per pulse oximeter. Sitting>Rsidelying>supine with max assist for LE management into bed. Pt repositioned in supine - left with needs in reach and bed alarm on.   Session 2: Pt received supine in bed in a deep sleep requiring increased time and verbal/tactile stimulation to awaken. Pt received and maintained on 1L of O2 via nasal cannula. RN in for medication administration. Supine>sitting R EOB with total assist to increase pt alertness and increase safety with swallowing medicine. Pt agreeable to therapy session. Initially requires max/total assist to prevent L anterior trunk LOB but therapist repositioned R UE and cued for midline orientation and pt progressed to requiring mod assist. Demonstrates decreased trunk control this session compared to AM session. Pt tolerated sitting EOB ~52minutes working on midline orientation and L visual scanning prior to spontaneously starting to lie on R side stating she wanted to lie down. Max assist for B LE management into bed. Total assist supine scoot towards HOB. Pt requesting to eat banana pudding - focused on L attention and L UE NMR via manually facilitating pt holding bowel with L hand and visually scanning L to eat using R hand - continues to demonstrate significant visual/perceptual impairments. Pt left with  HOB elevated >30degrees, needs in reach, and bed alarm on with NT arriving shortly to assist with completing meal.  Therapy Documentation Precautions:  Precautions Precautions: Fall, Other (comment) Precaution Comments:  supplemental O2, L side inattention, L visual field deficits, left side sensory deficits, left hemiparesis, pusher Restrictions Weight Bearing Restrictions: No LLE Weight Bearing: Non weight bearing  Pain:   Session 1: No reports of pain throughout session.  Session 2: No reports of pain throughout session.   Therapy/Group: Individual Therapy  Ginny Forth , PT, DPT, CSRS  08/22/2019, 7:54 AM

## 2019-08-22 NOTE — Plan of Care (Signed)
  Problem: RH BOWEL ELIMINATION Goal: RH STG MANAGE BOWEL WITH ASSISTANCE Description: STG Manage Bowel with mod Assistance. Outcome: Not Progressing; incontinence   Problem: RH BLADDER ELIMINATION Goal: RH STG MANAGE BLADDER WITH ASSISTANCE Description: STG Manage Bladder With mod Assistance Outcome: Not Progressing; I and O cath

## 2019-08-22 NOTE — Progress Notes (Signed)
Occupational Therapy Session Note  Patient Details  Name: Caitlin Vasquez MRN: [id number] Date of Birth: 1945-08-12  Today's Date: 08/22/2019 OT Individual Time: 4098-1191 OT Individual Time Calculation (min): 69 min    Short Term Goals: Week 1:  OT Short Term Goal 1 (Week 1): Pt will maintain static sitting balance for 3 mins EOB with min assist in preparation for selfcare tasks. OT Short Term Goal 2 (Week 1): Pt will complete UB bathing in supported sitting with no more than min assist for two consecutive sessions. OT Short Term Goal 3 (Week 1): Pt will locate grooming and bathing items placed left of midline with no more than mod instructional cueing. OT Short Term Goal 4 (Week 1): Pt will donn a pullover shirt with mod assist following hemi dressing techniques.  Skilled Therapeutic Interventions/Progress Updates:    Pt completed supine to sit EOB with max assist to start session in order to attempt toilet transfer and toileting on the [facility].  She needed total assist for stand pivot transfer to the [facility] on the left side.  Once she was lined up with the 3:1, she needed total assist +2 (pt 25%) for clothing management.  Caitlin Vasquez was successful with a small bowel movement but needed total assist +2 (pt 25%) for toilet hygiene before transferring around to the side of the bed with total assist.  Worked on bathing and dressing from the EOB to supine for remainder of session.  She still demonstrates severe pushing to the left side and forward, requiring total assist for sitting balance.  Gave her a target on the right side to move toward to assist with decreasing pushing.  Also placed the wash pan on the right side to incorporate slight weightshift to the right as well when reaching with the RUE.  She needed mod assist for UB bathing in supported sitting.  She needed max assist for donning pullover shirt as well.  Total assist was needed for washing her legs and peri area sit to supine for safety.  She  was able to roll to the right side in supine with mod assist and max demonstrational cueing.  Rolling to the left side was completed with min guard for LB dressing which was total assist.  She continues to need max instructional cueing to sequence through bathing and dressing tasks with decreased sustained attention.  For example when working on UB bathing, instead of completing the task, she kept perseverating on trying to find her deodorant to put on.  She also needed max hand over hand throughout session to complete use of the LUE as a gross assist with holding the deodorant or wash cloth.  Mod instructional cueing was needed for maintaining visual attention on the LUE as well as for scanning left of midline for locating bathing items.  Finished session with pt in the bed resting and call button and phone in reach.    Therapy Documentation Precautions:  Precautions Precautions: Fall, Other (comment) Precaution Comments: supplemental O2, L side inattention, L visual field deficits, left side sensory deficits, left hemiparesis, pusher Restrictions Weight Bearing Restrictions: No LLE Weight Bearing: Weight bearing as tolerated  Pain: Pain Assessment Pain Scale: Faces Pain Score: 0-No pain ADL: See Care Tool Section for some details of mobility and selfcare  Therapy/Group: Individual Therapy  Caitlin Vasquez OTR/L 08/22/2019, 3:57 PM

## 2019-08-22 NOTE — Progress Notes (Signed)
[place] PHYSICAL MEDICINE & REHABILITATION PROGRESS NOTE   Subjective/Complaints: Tried calling son , left msg   PT notes tachypnea, pt denies CP   ROS: denies pain, constipation, and insomnia.  Objective:   No results found. Recent Labs    08/20/19 0535  WBC 9.6  HGB 13.3  HCT 41.4  PLT 312   Recent Labs    08/20/19 0535  NA 137  K 4.3  CL 98  CO2 28  GLUCOSE 109*  BUN 24*  CREATININE 1.06*  CALCIUM 8.8*    Intake/Output Summary (Last 24 hours) at 08/22/2019 0840 Last data filed at 08/22/2019 0630 Gross per 24 hour  Intake 220 ml  Output 950 ml  Net -730 ml     Physical Exam: Vital Signs Blood pressure (!) 124/99, pulse 61, temperature 98.6 F (37 C), resp. rate (!) 24, SpO2 100 %.  General: No apparent distress  General: No acute distress Mood and affect are appropriate Heart: Regular rate and rhythm no rubs murmurs or extra sounds Lungs: Clear to auscultation, breathing unlabored, no rales or wheezes Abdomen: Positive bowel sounds, soft nontender to palpation, nondistended Extremities: No clubbing, cyanosis, or edema Skin: No evidence of breakdown, no evidence of rash  Neurologic: Cranial nerves II through XII intact, motor strength is 5/5 in right , 4/5 left  deltoid, bicep, tricep, grip, hip flexor, knee extensors, ankle dorsiflexor and plantar flexor Sensory exam normal sensation to light touch and proprioception in right and absent left  upper and lower extremities Cerebellar exam normal finger to nose to finger as well as heel to shin in bilateral upper and lower extremities Musculoskeletal: Full range of motion in all 4 extremities. No joint swelling  Assessment/Plan: 1. Functional deficits secondary to cerebellar hemorrhage which require 3+ hours per day of interdisciplinary therapy in a comprehensive inpatient rehab setting.  Physiatrist is providing close team supervision and 24 hour management of active medical problems listed  below.  Physiatrist and rehab team continue to assess barriers to discharge/monitor patient progress toward functional and medical goals  Care Tool:  Bathing    Body parts bathed by patient: Chest, Abdomen, Front perineal area, Left arm, Right upper leg, Left upper leg, Face (sit to supine)   Body parts bathed by helper: Right lower leg, Left lower leg, Right arm, Buttocks     Bathing assist Assist Level: 2 Helpers     Upper Body Dressing/Undressing Upper body dressing   What is the patient wearing?: Pull over shirt    Upper body assist Assist Level: Total Assistance - Patient < 25%    Lower Body Dressing/Undressing Lower body dressing      What is the patient wearing?: Pants     Lower body assist Assist for lower body dressing: Total Assistance - Patient < 25%     Toileting Toileting    Toileting assist Assist for toileting: Dependent - Patient 0%     Transfers Chair/bed transfer  Transfers assist  Chair/bed transfer activity did not occur: Safety/medical concerns  Chair/bed transfer assist level: Total Assistance - Patient < 25% (squat pivot)     Locomotion Ambulation   Ambulation assist   Ambulation activity did not occur: Safety/medical concerns          Walk 10 feet activity   Assist  Walk 10 feet activity did not occur: Safety/medical concerns        Walk 50 feet activity   Assist Walk 50 feet with 2 turns activity did not occur:  Safety/medical concerns         Walk 150 feet activity   Assist Walk 150 feet activity did not occur: Safety/medical concerns         Walk 10 feet on uneven surface  activity   Assist Walk 10 feet on uneven surfaces activity did not occur: Safety/medical concerns         Wheelchair     Assist Will patient use wheelchair at discharge?: No (No Long term goals set by PT)             Wheelchair 50 feet with 2 turns activity    Assist            Wheelchair 150 feet  activity     Assist          Blood pressure (!) 124/99, pulse 61, temperature 98.6 F (37 C), resp. rate (!) 24, SpO2 100 %.  Medical Problem List and Plan: 1.  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  Antithrombotics: -DVT/anticoagulation:  Mechanical: Sequential compression devices, below knee Bilateral lower extremities             -antiplatelet therapy: N/a due to bleed.  3. Pain Management: Back pain resolved with treatment of UTI. Discontinue hydrocodone and will use tramadol or tylenol prn for now.  4. Anxiety disorder/Mood: Per patient/son was using ? Medication for anxiety.              -antipsychotic agents: N/a 5. Neuropsych: This patient is not fully capable of making decisions on her own behalf. 6. Skin/Wound Care:  Routine pressure relief measures.  7. Fluids/Electrolytes/Nutrition: Strict I/O. Check labs in am. 8. NSTEMI with fluid overload: Treated medically with Coreg, Lisinopril and Lipitor.  9. Severe COPD: Encourage pulmonary hygiene--will order flutter. Wean oxygen to off as able as has not use it for past year per son. Albuterol MDI prn. 10. E coli UTI: Has completed 7 day antibiotic course on 07/11.  11. HTN: SBP goal <160. Monitor BP tid--on Norvasc and HCTZ. BP continues to be much better controlled now off Labetalol. Vitals:   08/21/19 1954 08/22/19 0508  BP: (!) 144/73 (!) 124/99  Pulse: 79 61  Resp:    Temp: (!) 97.3 F (36.3 C) 98.6 F (37 C)  SpO2: 93% 100%  trial flomax for urinary retention, monitor BP 12. Lethargy: Multifactorial? Received hydrocodone X 2 yesterday and Ambien last night. Will change. More alert today 13. Urinary retention: Continue monitor voiding with PVR checks/bladder scans 4-6 hours and I/O caht to keep volumes <300 cc.  14. A fib: Not AC candidate due to hemorrhage.  Rate controlled 15. Dysphagia: On dysphagia 3 diet with nectar liquids.  16. Tachypnea: RR down to 20. CXR reviewed and stable. Lung exam with reduced BS at bases as expected no wheezes will start Xopenex BID      LOS: 3 days A FACE TO FACE EVALUATION WAS PERFORMED  Erick Colace 08/22/2019, 8:40 AM

## 2019-08-22 NOTE — Plan of Care (Signed)
°  Problem: RH Problem Solving Goal: LTG Patient will demonstrate problem solving for (SLP) Description: LTG:  Patient will demonstrate problem solving for basic/complex daily situations with cues  (SLP) Flowsheets (Taken 08/22/2019 1128) LTG: Patient will demonstrate problem solving for (SLP): Basic daily situations LTG Patient will demonstrate problem solving for: Supervision Note: Goal initiated 7/15 to address deficits noted in tx

## 2019-08-23 ENCOUNTER — Inpatient Hospital Stay (HOSPITAL_COMMUNITY): Payer: Medicare Other | Admitting: Occupational Therapy

## 2019-08-23 ENCOUNTER — Inpatient Hospital Stay (HOSPITAL_COMMUNITY): Payer: Medicare Other

## 2019-08-23 ENCOUNTER — Inpatient Hospital Stay (HOSPITAL_COMMUNITY): Payer: Medicare Other | Admitting: Speech Pathology

## 2019-08-23 DIAGNOSIS — I10 Essential (primary) hypertension: Secondary | ICD-10-CM

## 2019-08-23 DIAGNOSIS — R339 Retention of urine, unspecified: Secondary | ICD-10-CM

## 2019-08-23 DIAGNOSIS — R0989 Other specified symptoms and signs involving the circulatory and respiratory systems: Secondary | ICD-10-CM

## 2019-08-23 DIAGNOSIS — R0682 Tachypnea, not elsewhere classified: Secondary | ICD-10-CM

## 2019-08-23 LAB — GLUCOSE, CAPILLARY
Glucose-Capillary: 79 mg/dL (ref 70–99)
Glucose-Capillary: 130 mg/dL — ABNORMAL HIGH (ref 70–99)
Glucose-Capillary: 100 mg/dL — ABNORMAL HIGH (ref 70–99)
Glucose-Capillary: 119 mg/dL — ABNORMAL HIGH (ref 70–99)

## 2019-08-23 NOTE — Progress Notes (Signed)
[facility] PHYSICAL MEDICINE & REHABILITATION PROGRESS NOTE   Subjective/Complaints: Patient seen laying in bed this AM.  She states she slept well overnight.  She is still sleepy this morning.  Limited engagement.  ROS: Limited due to lethargy  Objective:   VAS [place] LOWER EXTREMITY VENOUS (DVT)  Result Date: 08/22/2019  Lower Venous DVT Study Indications: Immobility, rehabilitation.  Limitations: Altered mental status, immobility. Comparison Study: Prior study from 04/07/16 is available for comparison Performing Technologist: Sherren Kerns RVS  Examination Guidelines: A complete evaluation includes B-mode imaging, spectral Doppler, color Doppler, and power Doppler as needed of all accessible portions of each vessel. Bilateral testing is considered an integral part of a complete examination. Limited examinations for reoccurring indications may be performed as noted. The reflux portion of the exam is performed with the patient in reverse Trendelenburg.  +---------+---------------+---------+-----------+----------+--------------+ RIGHT    CompressibilityPhasicitySpontaneityPropertiesThrombus Aging +---------+---------------+---------+-----------+----------+--------------+ CFV      Full           Yes      Yes                                 +---------+---------------+---------+-----------+----------+--------------+ SFJ      Full                                                        +---------+---------------+---------+-----------+----------+--------------+ FV Prox  Full                                                        +---------+---------------+---------+-----------+----------+--------------+ FV Mid   Full                                                        +---------+---------------+---------+-----------+----------+--------------+ FV DistalFull                                                         +---------+---------------+---------+-----------+----------+--------------+ PFV      Full                                                        +---------+---------------+---------+-----------+----------+--------------+ POP      Full           Yes      Yes                                 +---------+---------------+---------+-----------+----------+--------------+ PTV      Full                                                        +---------+---------------+---------+-----------+----------+--------------+  PERO     Full                                                        +---------+---------------+---------+-----------+----------+--------------+   +---------+---------------+---------+-----------+----------+--------------+ LEFT     CompressibilityPhasicitySpontaneityPropertiesThrombus Aging +---------+---------------+---------+-----------+----------+--------------+ CFV      Full           Yes      Yes                                 +---------+---------------+---------+-----------+----------+--------------+ SFJ      Full                                                        +---------+---------------+---------+-----------+----------+--------------+ FV Prox  Full                                                        +---------+---------------+---------+-----------+----------+--------------+ FV Mid   Full                                                        +---------+---------------+---------+-----------+----------+--------------+ FV DistalFull                                                        +---------+---------------+---------+-----------+----------+--------------+ PFV      Full                                                        +---------+---------------+---------+-----------+----------+--------------+ POP                     Yes      Yes                                  +---------+---------------+---------+-----------+----------+--------------+ PTV      Full                                                        +---------+---------------+---------+-----------+----------+--------------+ PERO     Full                                                        +---------+---------------+---------+-----------+----------+--------------+  Summary: BILATERAL: - No evidence of deep vein thrombosis seen in the lower extremities, bilaterally. -   *See table(s) above for measurements and observations.    Preliminary    No results for input(s): WBC, HGB, HCT, PLT in the last 72 hours. No results for input(s): NA, K, CL, CO2, GLUCOSE, BUN, CREATININE, CALCIUM in the last 72 hours.  Intake/Output Summary (Last 24 hours) at 08/23/2019 1134 Last data filed at 08/23/2019 0815 Gross per 24 hour  Intake 556 ml  Output 900 ml  Net -344 ml     Physical Exam: Vital Signs Blood pressure (!) 158/64, pulse 61, temperature 97.7 F (36.5 C), resp. rate 20, SpO2 98 %. Constitutional: No distress . Vital signs reviewed. HENT: Normocephalic.  Atraumatic. Eyes: EOMI. No discharge. Cardiovascular: No JVD. Respiratory: Normal effort.  No stridor.  + [place]. GI: Non-distended. Skin: Warm and dry.  Intact. Psych: Normal mood.  Normal behavior. Musc: No edema in extremities.  No tenderness in extremities. Neurologic: Somnolent/lethargic Motor: Limited due to participation, but appears to be 4/5 LUE/LLE  Assessment/Plan: 1. Functional deficits secondary to cerebellar hemorrhage which require 3+ hours per day of interdisciplinary therapy in a comprehensive inpatient rehab setting.  Physiatrist is providing close team supervision and 24 hour management of active medical problems listed below.  Physiatrist and rehab team continue to assess barriers to discharge/monitor patient progress toward functional and medical goals  Care Tool:  Bathing    Body parts bathed by patient:  Left arm, Chest, Abdomen, Front perineal area, Right upper leg, Left upper leg, Face   Body parts bathed by helper: Right lower leg, Left lower leg, Right arm, Buttocks     Bathing assist Assist Level: 2 Helpers     Upper Body Dressing/Undressing Upper body dressing   What is the patient wearing?: Pull over shirt    Upper body assist Assist Level: Total Assistance - Patient < 25%    Lower Body Dressing/Undressing Lower body dressing      What is the patient wearing?: Pants     Lower body assist Assist for lower body dressing: Total Assistance - Patient < 25%     Toileting Toileting    Toileting assist Assist for toileting: 2 Helpers     Transfers Chair/bed transfer  Transfers assist  Chair/bed transfer activity did not occur: Safety/medical concerns  Chair/bed transfer assist level: Total Assistance - Patient < 25% (stand pivot)     Locomotion Ambulation   Ambulation assist   Ambulation activity did not occur: Safety/medical concerns          Walk 10 feet activity   Assist  Walk 10 feet activity did not occur: Safety/medical concerns        Walk 50 feet activity   Assist Walk 50 feet with 2 turns activity did not occur: Safety/medical concerns         Walk 150 feet activity   Assist Walk 150 feet activity did not occur: Safety/medical concerns         Walk 10 feet on uneven surface  activity   Assist Walk 10 feet on uneven surfaces activity did not occur: Safety/medical concerns         Wheelchair     Assist Will patient use wheelchair at discharge?: No (No Long term goals set by PT)             Wheelchair 50 feet with 2 turns activity    Assist  Wheelchair 150 feet activity     Assist          Blood pressure (!) 158/64, pulse 61, temperature 97.7 F (36.5 C), resp. rate 20, SpO2 98 %.  Medical Problem List and Plan: 1.  Impaired mobility and ADLs secondary to Nontruamatic  intracerbral hemorrhage in cerebellum  Continue CIR 2.  Antithrombotics: -DVT/anticoagulation:  Mechanical: Sequential compression devices, below knee Bilateral lower extremities  Vascular ultrasound negative for DVT             -antiplatelet therapy: N/a due to bleed.  3. Pain Management: Back pain resolved with treatment of UTI. Discontinue hydrocodone and will use tramadol or tylenol prn for now.  4. Anxiety disorder/Mood: Per patient/son was using ? Medication for anxiety.              -antipsychotic agents: N/a 5. Neuropsych: This patient is not fully capable of making decisions on her own behalf. 6. Skin/Wound Care:  Routine pressure relief measures.  7. Fluids/Electrolytes/Nutrition: Strict I/O.  8. NSTEMI with fluid overload: Treated medically with Coreg, Lisinopril and Lipitor.  9. Severe COPD: Encourage pulmonary hygiene--ordered flutter. Wean oxygen to off as able as has not use it for past year per son. Albuterol MDI prn. 10. E coli UTI: Has completed 7 day antibiotic course on 07/11.  11. HTN: SBP goal <160. Monitor BP tid--on Norvasc and HCTZ.   BP continues to be much better controlled now off Labetalol. Vitals:   08/23/19 0342 08/23/19 0533  BP: (!) 158/64   Pulse: 61   Resp: 20   Temp: 97.7 F (36.5 C)   SpO2: 100% 98%   Labile on 7/16, monitor for pattern 12. Lethargy: Multifactorial?  13. Urinary retention: Continue monitor voiding with PVR checks/bladder scans 4-6 hours and I/O caht to keep volumes <300 cc.   Flomax started  Continue to monitor 14. A fib: Not AC candidate due to hemorrhage.  Rate controlled. 15. Dysphagia: On dysphagia 3 nectars.  Advance diet as tolerated 16. Tachypnea: Start Xopenex BID   Improving  LOS: 4 days A FACE TO FACE EVALUATION WAS PERFORMED  Kalee Mcclenathan Karis Juba 08/23/2019, 11:34 AM

## 2019-08-23 NOTE — Progress Notes (Signed)
Physical Therapy Session Note  Patient Details  Name: Caitlin Vasquez MRN: [id number] Date of Birth: 21-Jun-1945  Today's Date: 08/23/2019 PT Individual Time: 1405-1505 PT Individual Time Calculation (min): 60 min   Short Term Goals: Week 1:  PT Short Term Goal 1 (Week 1): Pt will perform supine<>sit with mod assist PT Short Term Goal 2 (Week 1): Pt will be able to maintain midline orientation in sitting for with no more than min assist of 1 PT Short Term Goal 3 (Week 1): Pt will perform sit<>stand with +2 mod assist PT Short Term Goal 4 (Week 1): Pt will perform bed<>chair transfers with +2 max assist Week 2:    Week 3:     Skilled Therapeutic Interventions/Progress Updates:    PAIN pt denies pain Pt initially supine and agreeable to treatment session.  Pt rolls to R w/mod assist, cued to assist w/LUE but very poor control and significant overshoot.  R side to side on elbow w/mod assist, max assist to complete transition then pushes heavily to L w/RUE.  Worked on midline sitting balance/orientation via the following activities: R hand in lap and using therapist in front for visual orientation - continues to lean stronly to L Sit to side on elbow to R, maintain up to 30 sec and return to upright - continues to overshoot/lean to L Repeated sit to side on elbow to sit - midline improves w/repeated efforts but is shortlived/returns to pushing w/RUE Reaching to R w/RUE, transferring items from post R to ant R.  Orientation briefly improves.  STS from edge of bed w/ max assist, therapist stabilizing LLE, HHA on R but w/arm in full extension to minimize pushing.  Pt stands 5-10 sec and returns impulsively to sitting.  Posture is moderately flexed which increases to max.  Repeated STS x 7 reps w/rest and sitting balance tasks between efforts.  Sit to supine w/max assist and cues for sequencing.  Pt rolls to L w/min assist, to R w/max assist and cues, hand over hand facilitation or  LUE/tactile cues LLE.  Pt noted to be incontenent of bowels.  Nursing notified.  Pt left supine w/rails up x 4, alarm set, bed in lowest position, and needs in reach.  Therapy Documentation Precautions:  Precautions Precautions: Fall, Other (comment) Precaution Comments: supplemental O2, L side inattention, L visual field deficits, left side sensory deficits, left hemiparesis, pusher to the left Restrictions Weight Bearing Restrictions: No LLE Weight Bearing: Weight bearing as tolerated    Therapy/Group: Individual Therapy  [name], PT   [name] 08/23/2019, 3:34 PM

## 2019-08-23 NOTE — Progress Notes (Signed)
Occupational Therapy Session Note  Patient Details  Name: Caitlin Vasquez MRN: [id number] Date of Birth: 08-23-1945  Today's Date: 08/23/2019 OT Individual Time: 2094-7096 OT Individual Time Calculation (min): 54 min    Short Term Goals: Week 1:  OT Short Term Goal 1 (Week 1): Pt will maintain static sitting balance for 3 mins EOB with min assist in preparation for selfcare tasks. OT Short Term Goal 2 (Week 1): Pt will complete UB bathing in supported sitting with no more than min assist for two consecutive sessions. OT Short Term Goal 3 (Week 1): Pt will locate grooming and bathing items placed left of midline with no more than mod instructional cueing. OT Short Term Goal 4 (Week 1): Pt will donn a pullover shirt with mod assist following hemi dressing techniques.  Skilled Therapeutic Interventions/Progress Updates:    Pt in bed to start session, agreeable to participation in OT.  She completed supine to sit EOB with mod assist and max instructional cueing for sequencing rolling to the right side.  Once sitting she needed max assist to maintain static sitting balance secondary to increased pushing and LOB to the left.  Had therapy tech sit on her right side for target to bring her left shoulder to in order to decrease pushing.  Worked on cleaning of bowel incontinence and brief from the EOB to start.  She was able to complete sit to stand with max assist X2 but needed total assist +2(pt 25%) to complete toilet hygiene and donning new brief.  Had her transfer stand pivot with max assist to the left to the wheelchair in order to go over to the sink to work on donning her pants, as some of the stool had gotten on her sheets.  She needed max assist for donning pants following hemi technique with decreased ability to perceptually orient the pants secondary to severe left visual field cut.  She stood with max assist at the sink for pulling the garments over her hips.  Next, she worked on brushing out her  mouth and re-applying her dentures with overall min assist.  She primarily used only the RUE for task secondary to not being able to consistently maintain visual fixation on the left hand for functional use.  Finished session with pt in the wheelchair and LUE positioned on the half lap tray.  Call button and phone in reach with safety belt alarm in place and son present.  Therapy Documentation Precautions:  Precautions Precautions: Fall, Other (comment) Precaution Comments: supplemental O2, L side inattention, L visual field deficits, left side sensory deficits, left hemiparesis, pusher to the left Restrictions Weight Bearing Restrictions: No LLE Weight Bearing: Weight bearing as tolerated  Pain: Pain Assessment Pain Scale: 0-10 Pain Score: 0-No pain ADL: See Care Tool Section for some details of mobility and selfcare  Therapy/Group: Individual Therapy  Micahel Omlor OTR/L 08/23/2019, 12:26 PM

## 2019-08-23 NOTE — Progress Notes (Signed)
Speech Language Pathology Daily Session Note  Patient Details  Name: Caitlin Vasquez MRN: [id number] Date of Birth: 1945-10-08  Today's Date: 08/23/2019 SLP Individual Time: 2725-3664 SLP Individual Time Calculation (min): 45 min  Short Term Goals: Week 1: SLP Short Term Goal 1 (Week 1): Pt will scan left of midline during functional tasks with mod A verbal and visual cues. SLP Short Term Goal 2 (Week 1): Pt will demonstrate sustained attention in 10 minute intervals during functional tasks with mod A verbal cues for redirection, SLP Short Term Goal 3 (Week 1): Pt will idenitfy 1 swallow or cognitive and 1 phsyical deficits from acute injury with max A verbal cues. SLP Short Term Goal 4 (Week 1): Pt will consume dys 3 textures and nectar thick liquids with supervision A verbal cues for swallow strategies. SLP Short Term Goal 5 (Week 1): Pt will participate in trials of thin liquids with minimal s/s aspirtation and vital signs remaining WFL prior to repeat instrumental swallow study. SLP Short Term Goal 6 (Week 1): Pt will consume regular textures with appropriate oral clearance given mod A verbal cues for use of swallow strategies.  Skilled Therapeutic Interventions: Pt was seen for skilled ST targeting dysphagia and cognitive goals. Upon arrival, pt required Mod A multimodal stimulation and extra time to arouse appropriately for participation. Once alert, she consumed current diet Dys 3 (mech soft) texture breakfast tray with nectar thick liquids with Min A verbal cues for use of strategies for oral clearance of very minimal left buccal pocketing of solids. One cough noted after bite of eggs; once condiment added pt reported increased ease of intake and no more coughing noted. Please send ketchup with eggs on breakfast tray. Recommend continue current diet.  SLP targeted functional visual scanning and problem solving with Moderate A verbal and visual cues required for locating items on left side of  breakfast tray and problem solving tray/item set up. Moderate cues also required for awareness and correction of left lean when sitting upright. One verbal cue required for redirection to self-feeding. SLP also initiated memory notebook to assist with daily recall and carryover of therapy techniques/strategies. Also of note, pt recalled having an unusual dreams overnight. Pt left laying in bed with alarm set and needs within reach. Continue per current plan of care.        Pain Pain Assessment Pain Scale: 0-10 Pain Score: 0-No pain  Therapy/Group: Individual Therapy  Little Ishikawa 08/23/2019, 7:17 AM

## 2019-08-24 ENCOUNTER — Inpatient Hospital Stay (HOSPITAL_COMMUNITY): Payer: Medicare Other | Admitting: Speech Pathology

## 2019-08-24 ENCOUNTER — Inpatient Hospital Stay (HOSPITAL_COMMUNITY): Payer: Medicare Other | Admitting: Physical Therapy

## 2019-08-24 LAB — GLUCOSE, CAPILLARY
Glucose-Capillary: 93 mg/dL (ref 70–99)
Glucose-Capillary: 126 mg/dL — ABNORMAL HIGH (ref 70–99)
Glucose-Capillary: 110 mg/dL — ABNORMAL HIGH (ref 70–99)
Glucose-Capillary: 116 mg/dL — ABNORMAL HIGH (ref 70–99)

## 2019-08-24 MED ORDER — BUSPIRONE HCL 5 MG PO TABS
2.5000 mg | ORAL_TABLET | Freq: Two times a day (BID) | ORAL | Status: DC
  Administered 2019-08-24 – 2019-08-27 (×5): 2.5 mg via ORAL
  Filled 2019-08-24 (×8): qty 1

## 2019-08-24 NOTE — Progress Notes (Signed)
Physical Therapy Session Note  Patient Details  Name: Caitlin Vasquez MRN: [id number] Date of Birth: 1945-05-14  Today's Date: 08/24/2019 PT Individual Time: 9622-2979 PT Individual Time Calculation (min): 58 min   Short Term Goals: Week 1:  PT Short Term Goal 1 (Week 1): Pt will perform supine<>sit with mod assist PT Short Term Goal 2 (Week 1): Pt will be able to maintain midline orientation in sitting for with no more than min assist of 1 PT Short Term Goal 3 (Week 1): Pt will perform sit<>stand with +2 mod assist PT Short Term Goal 4 (Week 1): Pt will perform bed<>chair transfers with +2 max assist  Skilled Therapeutic Interventions/Progress Updates:    Pt received L sidelying in bed asleep, but easily awakens and agreeable to therapy session. Retrieved TIS w/c for improved OOB activity tolerance and allow pressure relief. Rolling to supine with mod assist for L hemibody management (demos L "alien" arm due to impaired proprioception) then sitting R EOB with mod/max assist for trunk upright - max cuing for sequencing with pt demoing improved initiation of tasks. Sitting EOB initially requires max assist to prevent L anterior LOB but with cuing and manual facilitation to place extended R arm abducted on bed (preventing pushing) able to maintain trunk control with close supervision and intermittent min assist to keep R arm position - cuing for upright trunk posture and midline orientation via having pt align herself with the therapist. L squat pivot EOB>TIS w/c with +2 max assist for lifting/pivoting hips and trunk control - requires max cuing and manual facilitation for R trunk lean as pt starts pushing causing L trunk LOB.  Transported to/from gym in w/c for time management and energy conservation. L squat pivot to EOM again with +2 max assist - continued cuing as above.  Sitting balance EOM >24minutes targeting activity tolerance, midline orientation, upright trunk posture, and L visual  scanning (stool placed under feet to promote LE weightbearing): - immediately after transfer to mat requires max assist to prevent L anterior trunk LOB but once positioned R hand able to maintain that position with close supervision and only intermittent min assist for trunk control when becoming fatigued - L visual scanning task to locate and call out color and number of items - intermittently requires forward trunk lean with R forearm support on knees for seated rest break Sit<>supine with mod/max assist for L hemibody management for supine rest break. Sit<>stand to/from EOM x2 reps with +2 mod assist (R HHA) and max facilitation for L hip/knee extension and trunk upright - pt only able to tolerate standing ~10seconds as pt reports she feels anxious/uncomfortable in standing. R squat pivot EOM>w/c with +2 max assist for lifting/pivoting hips. Transported back to room. Performed sit<>stand to/from stedy to determine if this will be safe with nursing staff - educated Gracie, NT to perform +2 assist transfers using stedy with specific instructions to monitor L anterior lean and L hemibody management for safety during transfer. Pt left seated tilted back in TIS w/c with needs in reach, L UE supported on pillow, and seat belt alarm on.  Therapy Documentation Precautions:  Precautions Precautions: Fall, Other (comment) Precaution Comments: supplemental O2, L side inattention, L visual field deficits, left side sensory deficits, left hemiparesis, pusher to the left Restrictions Weight Bearing Restrictions: No LLE Weight Bearing: Weight bearing as tolerated  Pain: During initial transfer reported low back pain but otherwise no complaints of pain during session.   Therapy/Group: Individual Therapy  Dannon Perlow  Verdell Face , PT, DPT, CSRS  08/24/2019, 7:56 AM

## 2019-08-24 NOTE — Progress Notes (Signed)
Speech Language Pathology Daily Session Note  Patient Details  Name: MAMYE BOLDS MRN: [id number] Date of Birth: 1945-09-07  Today's Date: 08/24/2019 SLP Individual Time: 1255-1340 SLP Individual Time Calculation (min): 45 min  Short Term Goals: Week 1: SLP Short Term Goal 1 (Week 1): Pt will scan left of midline during functional tasks with mod A verbal and visual cues. SLP Short Term Goal 2 (Week 1): Pt will demonstrate sustained attention in 10 minute intervals during functional tasks with mod A verbal cues for redirection, SLP Short Term Goal 3 (Week 1): Pt will idenitfy 1 swallow or cognitive and 1 phsyical deficits from acute injury with max A verbal cues. SLP Short Term Goal 4 (Week 1): Pt will consume dys 3 textures and nectar thick liquids with supervision A verbal cues for swallow strategies. SLP Short Term Goal 5 (Week 1): Pt will participate in trials of thin liquids with minimal s/s aspirtation and vital signs remaining WFL prior to repeat instrumental swallow study. SLP Short Term Goal 6 (Week 1): Pt will consume regular textures with appropriate oral clearance given mod A verbal cues for use of swallow strategies.  Skilled Therapeutic Interventions: Skilled treatment session focused on cognitive goals. Upon arrival, patient was awake while upright in the wheelchair. However, patient with audible rhonchi that appeared to be coming from her upper airway. Patient attempted to clear with cued coughing without success. RN aware and respiratory called. Because of this, trials of thin liquids were not attempted today. SLP facilitated session by providing a calendar to orientation to the date. Patient utilized with Min A verbal cues and extra time with Total A  needed for recall of her memory notebook. Patient also participated in a basic money management task with Max-Total A multimodal cues needed for sorting and counting money. Patient requested to get back to bed due to fatigue,  therefore, patient transferred with +2 assist via the [place]. Patient left upright in bed with alarm on and all needs within reach. Continue with current plan of care.       Pain No/Denies Pain   Therapy/Group: Individual Therapy  Sandar Krinke 08/24/2019, 1:42 PM

## 2019-08-24 NOTE — Progress Notes (Signed)
[place] PHYSICAL MEDICINE & REHABILITATION PROGRESS NOTE   Subjective/Complaints: Had a fair night of sleep. Son at bedside and is concerned that she's a little more sleepy on the buspar  ROS: Limited due to cognitive/behavioral   Objective:   VAS [place] LOWER EXTREMITY VENOUS (DVT)  Result Date: 08/22/2019  Lower Venous DVT Study Indications: Immobility, rehabilitation.  Limitations: Altered mental status, immobility. Comparison Study: Prior study from 04/07/16 is available for comparison Performing Technologist: Sherren Kerns RVS  Examination Guidelines: A complete evaluation includes B-mode imaging, spectral Doppler, color Doppler, and power Doppler as needed of all accessible portions of each vessel. Bilateral testing is considered an integral part of a complete examination. Limited examinations for reoccurring indications may be performed as noted. The reflux portion of the exam is performed with the patient in reverse Trendelenburg.  +---------+---------------+---------+-----------+----------+--------------+ RIGHT    CompressibilityPhasicitySpontaneityPropertiesThrombus Aging +---------+---------------+---------+-----------+----------+--------------+ CFV      Full           Yes      Yes                                 +---------+---------------+---------+-----------+----------+--------------+ SFJ      Full                                                        +---------+---------------+---------+-----------+----------+--------------+ FV Prox  Full                                                        +---------+---------------+---------+-----------+----------+--------------+ FV Mid   Full                                                        +---------+---------------+---------+-----------+----------+--------------+ FV DistalFull                                                         +---------+---------------+---------+-----------+----------+--------------+ PFV      Full                                                        +---------+---------------+---------+-----------+----------+--------------+ POP      Full           Yes      Yes                                 +---------+---------------+---------+-----------+----------+--------------+ PTV      Full                                                        +---------+---------------+---------+-----------+----------+--------------+  PERO     Full                                                        +---------+---------------+---------+-----------+----------+--------------+   +---------+---------------+---------+-----------+----------+--------------+ LEFT     CompressibilityPhasicitySpontaneityPropertiesThrombus Aging +---------+---------------+---------+-----------+----------+--------------+ CFV      Full           Yes      Yes                                 +---------+---------------+---------+-----------+----------+--------------+ SFJ      Full                                                        +---------+---------------+---------+-----------+----------+--------------+ FV Prox  Full                                                        +---------+---------------+---------+-----------+----------+--------------+ FV Mid   Full                                                        +---------+---------------+---------+-----------+----------+--------------+ FV DistalFull                                                        +---------+---------------+---------+-----------+----------+--------------+ PFV      Full                                                        +---------+---------------+---------+-----------+----------+--------------+ POP                     Yes      Yes                                  +---------+---------------+---------+-----------+----------+--------------+ PTV      Full                                                        +---------+---------------+---------+-----------+----------+--------------+ PERO     Full                                                        +---------+---------------+---------+-----------+----------+--------------+  Summary: BILATERAL: - No evidence of deep vein thrombosis seen in the lower extremities, bilaterally. -   *See table(s) above for measurements and observations.    Preliminary    No results for input(s): WBC, HGB, HCT, PLT in the last 72 hours. No results for input(s): NA, K, CL, CO2, GLUCOSE, BUN, CREATININE, CALCIUM in the last 72 hours.  Intake/Output Summary (Last 24 hours) at 08/24/2019 1117 Last data filed at 08/24/2019 1028 Gross per 24 hour  Intake 240 ml  Output 1250 ml  Net -1010 ml     Physical Exam: Vital Signs Blood pressure (!) 131/95, pulse 67, temperature 97.8 F (36.6 C), temperature source Oral, resp. rate 18, weight 54.9 kg, SpO2 98 %. Constitutional: No distress . Vital signs reviewed. HEENT: EOMI, oral membranes moist Neck: supple Cardiovascular: RRR without murmur. No JVD    Respiratory/Chest: CTA Bilaterally without wheezes or rales. Normal effort    GI/Abdomen: BS +, non-tender, non-distended Ext: no clubbing, cyanosis, or edema Psych: pleasant and cooperative Skin: dry/intact Musc: No edema in extremities.  No tenderness in extremities. Neurologic: slow but follows commands. Normal language. Fair awareness Motor: grossly 4/5 LUE/LLE  Assessment/Plan: 1. Functional deficits secondary to cerebellar hemorrhage which require 3+ hours per day of interdisciplinary therapy in a comprehensive inpatient rehab setting.  Physiatrist is providing close team supervision and 24 hour management of active medical problems listed below.  Physiatrist and rehab team continue to assess barriers to  discharge/monitor patient progress toward functional and medical goals  Care Tool:  Bathing    Body parts bathed by patient: Left arm, Chest, Abdomen, Front perineal area, Right upper leg, Left upper leg, Face   Body parts bathed by helper: Right lower leg, Left lower leg, Right arm, Buttocks     Bathing assist Assist Level: 2 Helpers     Upper Body Dressing/Undressing Upper body dressing   What is the patient wearing?: Pull over shirt    Upper body assist Assist Level: Total Assistance - Patient < 25%    Lower Body Dressing/Undressing Lower body dressing      What is the patient wearing?: Pants     Lower body assist Assist for lower body dressing: 2 Helpers (sit to stand)     Editor, commissioning assist Assist for toileting: 2 Helpers     Transfers Chair/bed transfer  Transfers assist  Chair/bed transfer activity did not occur: Safety/medical concerns  Chair/bed transfer assist level: Maximal Assistance - Patient 25 - 49% (stand pivot)     Locomotion Ambulation   Ambulation assist   Ambulation activity did not occur: Safety/medical concerns          Walk 10 feet activity   Assist  Walk 10 feet activity did not occur: Safety/medical concerns        Walk 50 feet activity   Assist Walk 50 feet with 2 turns activity did not occur: Safety/medical concerns         Walk 150 feet activity   Assist Walk 150 feet activity did not occur: Safety/medical concerns         Walk 10 feet on uneven surface  activity   Assist Walk 10 feet on uneven surfaces activity did not occur: Safety/medical concerns         Wheelchair     Assist Will patient use wheelchair at discharge?: No (No Long term goals set by PT)             Wheelchair 50  feet with 2 turns activity    Assist            Wheelchair 150 feet activity     Assist          Blood pressure (!) 131/95, pulse 67, temperature 97.8 F (36.6  C), temperature source Oral, resp. rate 18, weight 54.9 kg, SpO2 98 %.  Medical Problem List and Plan: 1.  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  Antithrombotics: -DVT/anticoagulation:  Mechanical: Sequential compression devices, below knee Bilateral lower extremities  Vascular ultrasound negative for DVT             -antiplatelet therapy: N/a due to bleed.  3. Pain Management: Back pain resolved with treatment of UTI. Discontinue hydrocodone and will use tramadol or tylenol prn for now.  4. Anxiety disorder/Mood: Buspar , decrease to 2.5mg  bid             -antipsychotic agents: N/a 5. Neuropsych: This patient is not fully capable of making decisions on her own behalf. 6. Skin/Wound Care:  Routine pressure relief measures.  7. Fluids/Electrolytes/Nutrition: Strict I/O.  8. NSTEMI with fluid overload: Treated medically with Coreg, Lisinopril and Lipitor.  9. Severe COPD: Encourage pulmonary hygiene--continue flutter. Wean oxygen to off as able as has not use it for past year per son. Albuterol MDI prn. 10. E coli UTI: Has completed 7 day antibiotic course on 07/11.  11. HTN: SBP goal <160. Monitor BP tid--on Norvasc and HCTZ.   BP continues to be much better controlled now off Labetalol. Vitals:   08/23/19 1947 08/24/19 0558  BP: (!) 121/56 (!) 131/95  Pulse: 72 67  Resp: 16 18  Temp: (!) 97.3 F (36.3 C) 97.8 F (36.6 C)  SpO2: 97% 98%   Occasional DBP increase, but overall better control 7/17  12. Lethargy: Multifactorial?   -will decrease buspar to 2.5mg  per discussion with son 66. Urinary retention: Continue monitor voiding with PVR checks/bladder scans 4-6 hours and I/O caht to keep volumes <300 cc.   Flomax started  Continue to monitor 14. A fib: Not AC candidate due to hemorrhage.  Rate controlled. 15. Dysphagia: On dysphagia 3 nectars.  Advance diet as tolerated 16. Tachypnea: Start Xopenex BID    Improving  LOS: 5 days A FACE TO FACE EVALUATION WAS PERFORMED  Ranelle Oyster 08/24/2019, 11:17 AM

## 2019-08-25 LAB — GLUCOSE, CAPILLARY
Glucose-Capillary: 101 mg/dL — ABNORMAL HIGH (ref 70–99)
Glucose-Capillary: 109 mg/dL — ABNORMAL HIGH (ref 70–99)
Glucose-Capillary: 122 mg/dL — ABNORMAL HIGH (ref 70–99)
Glucose-Capillary: 107 mg/dL — ABNORMAL HIGH (ref 70–99)

## 2019-08-25 NOTE — Progress Notes (Signed)
[place] PHYSICAL MEDICINE & REHABILITATION PROGRESS NOTE   Subjective/Complaints: Slept better. Denied pain this morning.   ROS: Limited due to cognitive/behavioral    Objective:   No results found. No results for input(s): WBC, HGB, HCT, PLT in the last 72 hours. No results for input(s): NA, K, CL, CO2, GLUCOSE, BUN, CREATININE, CALCIUM in the last 72 hours.  Intake/Output Summary (Last 24 hours) at 08/25/2019 1103 Last data filed at 08/25/2019 1000 Gross per 24 hour  Intake 500 ml  Output 205 ml  Net 295 ml     Physical Exam: Vital Signs Blood pressure 135/74, pulse 74, temperature 97.7 F (36.5 C), resp. rate 20, weight 55 kg, SpO2 99 %. Constitutional: No distress . Vital signs reviewed. HEENT: EOMI, oral membranes moist Neck: supple Cardiovascular: RRR without murmur. No JVD    Respiratory/Chest: CTA Bilaterally without wheezes or rales. Normal effort    GI/Abdomen: BS +, non-tender, non-distended Ext: no clubbing, cyanosis, or edema Psych: pleasant and cooperative Skin: dry/intact Musc: No edema in extremities.  No tenderness in extremities. Neurologic: reasonably alert. Delayed processing. Normal language. Fair awareness Motor: grossly 4/5 LUE/LLE  Assessment/Plan: 1. Functional deficits secondary to cerebellar hemorrhage which require 3+ hours per day of interdisciplinary therapy in a comprehensive inpatient rehab setting.  Physiatrist is providing close team supervision and 24 hour management of active medical problems listed below.  Physiatrist and rehab team continue to assess barriers to discharge/monitor patient progress toward functional and medical goals  Care Tool:  Bathing    Body parts bathed by patient: Left arm, Chest, Abdomen, Front perineal area, Right upper leg, Left upper leg, Face   Body parts bathed by helper: Right lower leg, Left lower leg, Right arm, Buttocks     Bathing assist Assist Level: 2 Helpers     Upper Body  Dressing/Undressing Upper body dressing   What is the patient wearing?: Pull over shirt    Upper body assist Assist Level: Total Assistance - Patient < 25%    Lower Body Dressing/Undressing Lower body dressing      What is the patient wearing?: Pants     Lower body assist Assist for lower body dressing: 2 Helpers (sit to stand)     Editor, commissioning assist Assist for toileting: 2 Helpers     Transfers Chair/bed transfer  Transfers assist  Chair/bed transfer activity did not occur: Safety/medical concerns  Chair/bed transfer assist level: 2 Helpers (+2 max assist)     Locomotion Ambulation   Ambulation assist   Ambulation activity did not occur: Safety/medical concerns          Walk 10 feet activity   Assist  Walk 10 feet activity did not occur: Safety/medical concerns        Walk 50 feet activity   Assist Walk 50 feet with 2 turns activity did not occur: Safety/medical concerns         Walk 150 feet activity   Assist Walk 150 feet activity did not occur: Safety/medical concerns         Walk 10 feet on uneven surface  activity   Assist Walk 10 feet on uneven surfaces activity did not occur: Safety/medical concerns         Wheelchair     Assist Will patient use wheelchair at discharge?: No (No Long term goals set by PT)             Wheelchair 50 feet with 2 turns activity  Assist            Wheelchair 150 feet activity     Assist          Blood pressure 135/74, pulse 74, temperature 97.7 F (36.5 C), resp. rate 20, weight 55 kg, SpO2 99 %.  Medical Problem List and Plan: 1.  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  Antithrombotics: -DVT/anticoagulation:  Mechanical: Sequential compression devices, below knee Bilateral lower extremities  Vascular ultrasound negative for DVT             -antiplatelet therapy: N/a  due to bleed.  3. Pain Management: Back pain resolved with treatment of UTI. Discontinue hydrocodone and will use tramadol or tylenol prn for now.  4. Anxiety disorder/Mood: Buspar , decreased to 2.5mg  bid on 7/17 d/t concerns over lethargy             -antipsychotic agents: N/a 5. Neuropsych: This patient is not fully capable of making decisions on her own behalf. 6. Skin/Wound Care:  Routine pressure relief measures.  7. Fluids/Electrolytes/Nutrition: Strict I/O.  8. NSTEMI with fluid overload: Treated medically with Coreg, Lisinopril and Lipitor.  9. Severe COPD: Encourage pulmonary hygiene--continue flutter. Wean oxygen to off as able as has not use it for past year per son. Albuterol MDI prn. 10. E coli UTI: Has completed 7 day antibiotic course on 07/11.  11. HTN: SBP goal <160. Monitor BP tid--on Norvasc and HCTZ.   BP continues to be much better controlled now off Labetalol. Vitals:   08/24/19 2009 08/25/19 0547  BP: (!) 123/56 135/74  Pulse: 76 74  Resp: 20 20  Temp: 98.3 F (36.8 C) 97.7 F (36.5 C)  SpO2: 99% 99%   Occasional DBP increase, but overall better control 7/18 12. Lethargy: Multifactorial?   -will decrease buspar to 2.5mg  per discussion with son 32. Urinary retention: Continue monitor voiding with PVR checks/bladder scans 4-6 hours and I/O caht to keep volumes <300 cc.   Flomax started  Some improvement but occasional volumes in 300's 14. A fib: Not AC candidate due to hemorrhage.  Rate controlled. 15. Dysphagia: On dysphagia 3 nectars.  Advance diet as tolerated 16. Tachypnea:   Xopenex BID   Improving  LOS: 6 days A FACE TO FACE EVALUATION WAS PERFORMED  Ranelle Oyster 08/25/2019, 11:03 AM

## 2019-08-26 ENCOUNTER — Inpatient Hospital Stay (HOSPITAL_COMMUNITY): Payer: Medicare Other | Admitting: Occupational Therapy

## 2019-08-26 ENCOUNTER — Inpatient Hospital Stay (HOSPITAL_COMMUNITY): Payer: Medicare Other

## 2019-08-26 ENCOUNTER — Inpatient Hospital Stay (HOSPITAL_COMMUNITY): Payer: Medicare Other | Admitting: Speech Pathology

## 2019-08-26 LAB — CBC
HCT: 43.7 % (ref 36.0–46.0)
Hemoglobin: 13.6 g/dL (ref 12.0–15.0)
MCH: 29 pg (ref 26.0–34.0)
MCHC: 31.1 g/dL (ref 30.0–36.0)
MCV: 93.2 fL (ref 80.0–100.0)
Platelets: 291 10*3/uL (ref 150–400)
RBC: 4.69 MIL/uL (ref 3.87–5.11)
RDW: 13.4 % (ref 11.5–15.5)
WBC: 11.3 10*3/uL — ABNORMAL HIGH (ref 4.0–10.5)
nRBC: 0 % (ref 0.0–0.2)

## 2019-08-26 LAB — GLUCOSE, CAPILLARY
Glucose-Capillary: 105 mg/dL — ABNORMAL HIGH (ref 70–99)
Glucose-Capillary: 143 mg/dL — ABNORMAL HIGH (ref 70–99)
Glucose-Capillary: 107 mg/dL — ABNORMAL HIGH (ref 70–99)
Glucose-Capillary: 115 mg/dL — ABNORMAL HIGH (ref 70–99)

## 2019-08-26 LAB — BASIC METABOLIC PANEL
Anion gap: 15 (ref 5–15)
BUN: 31 mg/dL — ABNORMAL HIGH (ref 8–23)
CO2: 21 mmol/L — ABNORMAL LOW (ref 22–32)
Calcium: 8.9 mg/dL (ref 8.9–10.3)
Chloride: 104 mmol/L (ref 98–111)
Creatinine, Ser: 1.24 mg/dL — ABNORMAL HIGH (ref 0.44–1.00)
GFR calc Af Amer: 49 mL/min — ABNORMAL LOW (ref >60)
GFR calc non Af Amer: 42 mL/min — ABNORMAL LOW (ref >60)
Glucose, Bld: 116 mg/dL — ABNORMAL HIGH (ref 70–99)
Potassium: 3.9 mmol/L (ref 3.5–5.1)
Sodium: 140 mmol/L (ref 135–145)

## 2019-08-26 NOTE — Progress Notes (Signed)
Speech Language Pathology Weekly Progress and Session Note  Patient Details  Name: Caitlin Vasquez MRN: [id number] Date of Birth: 05/17/1945  Beginning of progress report period: August 19, 2019 End of progress report period: August 26, 2019  Today's Date: 08/26/2019 SLP Individual Time: 6734-1937 SLP Individual Time Calculation (min): 43 min  Short Term Goals: Week 1: SLP Short Term Goal 1 (Week 1): Pt will scan left of midline during functional tasks with mod A verbal and visual cues. SLP Short Term Goal 1 - Progress (Week 1): Progressing toward goal SLP Short Term Goal 2 (Week 1): Pt will demonstrate sustained attention in 10 minute intervals during functional tasks with mod A verbal cues for redirection, SLP Short Term Goal 2 - Progress (Week 1): Met SLP Short Term Goal 3 (Week 1): Pt will idenitfy 1 swallow or cognitive and 1 phsyical deficits from acute injury with max A verbal cues. SLP Short Term Goal 3 - Progress (Week 1): Progressing toward goal SLP Short Term Goal 4 (Week 1): Pt will consume dys 3 textures and nectar thick liquids with supervision A verbal cues for swallow strategies. SLP Short Term Goal 4 - Progress (Week 1): Met SLP Short Term Goal 5 (Week 1): Pt will participate in trials of thin liquids with minimal s/s aspirtation and vital signs remaining WFL prior to repeat instrumental swallow study. SLP Short Term Goal 5 - Progress (Week 1): Progressing toward goal SLP Short Term Goal 6 (Week 1):  (not targeted this week) SLP Short Term Goal 6 - Progress (Week 1): Discontinued (comment) - not targeted this week    New Short Term Goals: Week 2: SLP Short Term Goal 1 (Week 2): Pt will scan left of midline during functional tasks with mod A verbal and visual cues. SLP Short Term Goal 2 (Week 2): Pt will demonstrate sustained attention in 10 minute intervals during functional tasks with min A verbal cues for redirection, SLP Short Term Goal 3 (Week 2): Pt will idenitfy 1  swallow or cognitive and 1 phsyical deficits from acute injury with max A verbal cues. SLP Short Term Goal 4 (Week 2): Pt will use compensatory aids or strategies to recall daily information with Mod A cues. SLP Short Term Goal 5 (Week 2): Pt will demonstrate ability to problem solving functional basic situations with Mod A multimodal cues. SLP Short Term Goal 6 (Week 2): Pt will consume trials of thin liquids with minmal overt s/sx aspiration X3 prior to repeat MBSS.  Weekly Progress Updates: Pt has made slow gains this reporting period and met 2 out of 5 targeted short term goals. Pt is currently Mod-Max assist for basic tasks due to cognitive impairments impacting her attention, awareness, problem solving, and short term memory. Pt also has dysphagia and is consuming dysphagia 3 (mech soft) texture diet with nectar thick liquids. She has begun participating in thin liquid trials in preparation for repeat MBSS. Pt and family education is ongoing. Pt would continue to benefit from skilled ST while inpatient in order to maximize functional independence and reduce burden of care prior to discharge. Anticipate that pt will need 24/7 supervision at discharge in addition to [facility] follow up at next level of care.       Intensity: Minumum of 1-2 x/day, 30 to 90 minutes Frequency: 3 to 5 out of 7 days Duration/Length of Stay: 09/12/19 Treatment/Interventions: Cognitive remediation/compensation;Cueing hierarchy;Dysphagia/aspiration precaution training;Patient/family education;Internal/external aids;Functional tasks   Daily Session  Skilled Therapeutic Interventions: Pt was seen for skilled  ST targeting dysphagia and cognition. She was very lethargic upon arrival, requiring Max A multimodal cueing and stimulation in order to arouse appropriately for participation. She also required Mod A for visual scanning to locate the date on a calendar. She required overall Max A for recall and use of memory notebook, but  able to recall earlier OT session focus with Mod A question cues. Mod A provided for verbal problem solving basic medication management questions. After completing oral care, she accepted ice and cup sips of thin H2O with delayed coughing noted X2, as well as wet/gurgly vocal quality prior to cough X1. Recommend continue current diet and additional trials of thin prior to repeat MBSS. Pt left laying in bed with alarm set and needs within reach. Continue per current plan of care.            Pain Pain Assessment Pain Scale: Faces Pain Score: 0-No pain Faces Pain Scale: No hurt  Therapy/Group: Individual Therapy  Arbutus Leas 08/26/2019, 7:12 AM

## 2019-08-26 NOTE — Progress Notes (Signed)
[facility] PHYSICAL MEDICINE & REHABILITATION PROGRESS NOTE   Subjective/Complaints:   Per OT pushing to left side, c/o breathing problems during therapy, no CP  ROS: Limited due to cognitive/behavioral    Objective:   No results found. No results for input(s): WBC, HGB, HCT, PLT in the last 72 hours. No results for input(s): NA, K, CL, CO2, GLUCOSE, BUN, CREATININE, CALCIUM in the last 72 hours.  Intake/Output Summary (Last 24 hours) at 08/26/2019 1005 Last data filed at 08/26/2019 0538 Gross per 24 hour  Intake 120 ml  Output 700 ml  Net -580 ml     Physical Exam: Vital Signs Blood pressure (!) 137/57, pulse 77, temperature 97.7 F (36.5 C), temperature source Oral, resp. rate 20, weight 56 kg, SpO2 99 %.  General: No acute distress Mood and affect are appropriate Heart: Regular rate and rhythm no rubs murmurs or extra sounds Lungs: Clear to auscultation, breathing unlabored, no rales or wheezes Abdomen: Positive bowel sounds, soft nontender to palpation, nondistended Extremities: No clubbing, cyanosis, or edema Skin: No evidence of breakdown, no evidence of rash Motor 4/5 Left side with absent proprioception and LT on left, 5/5 strength on RIght side   Assessment/Plan: 1. Functional deficits secondary to cerebellar hemorrhage which require 3+ hours per day of interdisciplinary therapy in a comprehensive inpatient rehab setting.  Physiatrist is providing close team supervision and 24 hour management of active medical problems listed below.  Physiatrist and rehab team continue to assess barriers to discharge/monitor patient progress toward functional and medical goals  Care Tool:  Bathing    Body parts bathed by patient: Left arm, Chest, Abdomen, Front perineal area, Right upper leg, Left upper leg, Face   Body parts bathed by helper: Right lower leg, Left lower leg, Right arm, Buttocks     Bathing assist Assist Level: 2 Helpers     Upper Body  Dressing/Undressing Upper body dressing   What is the patient wearing?: Pull over shirt    Upper body assist Assist Level: Total Assistance - Patient < 25%    Lower Body Dressing/Undressing Lower body dressing      What is the patient wearing?: Pants     Lower body assist Assist for lower body dressing: 2 Helpers (sit to stand)     Editor, commissioning assist Assist for toileting: 2 Helpers     Transfers Chair/bed transfer  Transfers assist  Chair/bed transfer activity did not occur: Safety/medical concerns  Chair/bed transfer assist level: 2 Helpers (+2 max assist)     Locomotion Ambulation   Ambulation assist   Ambulation activity did not occur: Safety/medical concerns          Walk 10 feet activity   Assist  Walk 10 feet activity did not occur: Safety/medical concerns        Walk 50 feet activity   Assist Walk 50 feet with 2 turns activity did not occur: Safety/medical concerns         Walk 150 feet activity   Assist Walk 150 feet activity did not occur: Safety/medical concerns         Walk 10 feet on uneven surface  activity   Assist Walk 10 feet on uneven surfaces activity did not occur: Safety/medical concerns         Wheelchair     Assist Will patient use wheelchair at discharge?: No (No Long term goals set by PT)  Wheelchair 50 feet with 2 turns activity    Assist            Wheelchair 150 feet activity     Assist          Blood pressure (!) 137/57, pulse 77, temperature 97.7 F (36.5 C), temperature source Oral, resp. rate 20, weight 56 kg, SpO2 99 %.  Medical Problem List and Plan: 1.  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  Antithrombotics: -DVT/anticoagulation:  Mechanical: Sequential compression devices, below knee Bilateral lower extremities  Vascular ultrasound negative for  DVT             -antiplatelet therapy: N/a due to bleed.  3. Pain Management: Back pain resolved with treatment of UTI. Discontinue hydrocodone and will use tramadol or tylenol prn for now.  4. Anxiety disorder/Mood: Buspar , decreased to 2.5mg  bid on 7/17 d/t concerns over lethargy             -antipsychotic agents: N/a 5. Neuropsych: This patient is not fully capable of making decisions on her own behalf. 6. Skin/Wound Care:  Routine pressure relief measures.  7. Fluids/Electrolytes/Nutrition: Strict I/O.  8. NSTEMI with fluid overload: Treated medically with Coreg, Lisinopril and Lipitor.  9. Severe COPD: Encourage pulmonary hygiene--continue flutter. Wean oxygen to off as able as has not use it for past year per son. Albuterol MDI prn. 10. E coli UTI: Has completed 7 day antibiotic course on 07/11.  11. HTN: SBP goal <160. Monitor BP tid--on Norvasc and HCTZ.   BP continues to be much better controlled now off Labetalol. Vitals:   08/25/19 1954 08/26/19 0320  BP: 129/68 (!) 137/57  Pulse: 70 77  Resp: 20 20  Temp: 98.3 F (36.8 C) 97.7 F (36.5 C)  SpO2: 98% 99%   Controlled 7/19 12. Lethargy: Multifactorial?   -will decrease buspar to 2.5mg  per discussion with son 60. Urinary retention: Continue monitor voiding with PVR checks/bladder scans 4-6 hours and I/O caht to keep volumes <300 cc.   Flomax started  Some improvement but occasional volumes in 300's 14. A fib: Not AC candidate due to hemorrhage.  Rate controlled. 15. Dysphagia: On dysphagia 3 nectars.  Advance diet as tolerated 16. Tachypnea:   Xopenex BID   Improving- still with DOE Sat nl on RA, lung exam mainly with reduced BS at bases   LOS: 7 days A FACE TO FACE EVALUATION WAS PERFORMED  Erick Colace 08/26/2019, 10:05 AM

## 2019-08-26 NOTE — Progress Notes (Signed)
Occupational Therapy Weekly Progress Note  Patient Details  Name: Caitlin Vasquez MRN: [id number] Date of Birth: Jan 12, 1946  Beginning of progress report period: August 20, 2019 End of progress report period: August 26, 2019  Today's Date: 08/26/2019 OT Individual Time: 4496-7591 OT Individual Time Calculation (min): 74 min    Patient has met 0 of 4 short term goals.  [name] is making slow progress with OT at this time based on significance of her deficits in relation to the left CVA.  Overall she continues to need mod assist for UB bathing in supported sitting, with total assist +2 (pt 25%) for LB bathing and dressing sit to stand.  Max assist is needed for UB dressing to donn a pullover shirt.  She needs max assist for static sitting balance overall with short periods of up to one minute where she can maintain her balance with min assist.  Dynamic sitting balance is at total assist with pt still demonstrating significant pusher tendencies to the left as well as increased forward trunk and cervical flexion.  LUE functional use is currently at a stabilizer level with max hand over hand assist secondary to not having any sensory feedback in the LUE.  She continues to need total assist for stand pivot transfers to the wheelchair and to the drop arm commode.  Total +2 (pt 25%) was needed for toileting tasks sit to stand as well. Caitlin Vasquez also demonstrates a severe left visual field cut and neglect for which she needed max instructional cueing for scanning left of midline.  Endurance continues to be limited as pt fatigues easily requesting to return back to bed after sitting up with therapy for only a limited amount of time.  Currently, she is tolerating 15/7 therapy schedule.  Recommend continued CIR level therapy at 15/7 to work toward min to mod assist level goals.    Patient continues to demonstrate the following deficits: muscle weakness, impaired timing and sequencing, unbalanced muscle  activation, decreased coordination and decreased motor planning, decreased midline orientation, decreased attention to left, left side neglect and decreased motor planning, decreased awareness, decreased problem solving, decreased safety awareness and decreased memory and decreased sitting balance, decreased standing balance, decreased postural control, hemiplegia and decreased balance strategies and therefore will continue to benefit from skilled OT intervention to enhance overall performance with BADL and Reduce care partner burden.  Patient progressing toward long term goals..  Continue plan of care.  OT Short Term Goals Week 2:  OT Short Term Goal 1 (Week 2): Pt will maintain static sitting balance for 3 mins EOB with min assist in preparation for selfcare tasks. OT Short Term Goal 2 (Week 2): Pt will complete UB bathing in supported sitting with no more than min assist for two consecutive sessions. OT Short Term Goal 3 (Week 2): Pt will locate grooming and bathing items placed left of midline with no more than mod instructional cueing. OT Short Term Goal 4 (Week 2): Pt will donn a pullover shirt with mod assist following hemi dressing techniques.  Skilled Therapeutic Interventions/Progress Updates:    Pt worked on bathing and dressing sit to stand at the sink this session.  She was able to transition from supine to sit EOB with mod assist.  Once sitting, she needed max assist to maintain sitting balance, with increased pushing and LOB to the left noted.  She completed total assist stand pivot transfer to the wheelchair with transition over to the sink for bathing and dressing tasks.  Max instructional cueing for scanning left of midline to locate washcloth and soap with max hand over hand also needed for integration of the LUE.  She was able to complete UB bathing with mod assist in supported sitting.  Total assist was needed for LB bathing sit to stand with decreased ability to maintain standing for  more than 15 seconds.  Increased lean to the left and pushing is noted if she tries to come forward from the back of the chair.  Max assist was needed for donning pullover shirt with total assist +2 (pt 30%) for LB dressing to donn her brief and pants.  Therapist completed donning gripper socks to finish session.  Pt was left sitting up in the tilt in space wheelchair with call button and phone in reach and safety belt in place.  Pt's son present at end of session for supervision as well.    Therapy Documentation Precautions:  Precautions Precautions: Fall, Other (comment) Precaution Comments: supplemental O2, L side inattention, L visual field deficits, left side sensory deficits, left hemiparesis, pusher to the left Restrictions Weight Bearing Restrictions: No LLE Weight Bearing: Weight bearing as tolerated  Pain: Pain Assessment Pain Scale: Faces Pain Score: 0-No pain   ADL: See Care Tool Section for some details of mobility and selfcare  Therapy/Group: Individual Therapy  MCGUIRE,JAMES OTR/L 08/26/2019, 12:59 PM  

## 2019-08-26 NOTE — Progress Notes (Signed)
Patient ID: Caitlin Vasquez, female   DOB: December 03, 1945, 74 y.o.   MRN: [id number]   Sw followed up with patient, son a bedside assisting patient with feeding. Patient or son had no questions or concerns currently.

## 2019-08-26 NOTE — Plan of Care (Signed)
  Problem: Consults Goal: RH STROKE PATIENT EDUCATION Description: See Patient Education module for education specifics  Outcome: Progressing   Problem: RH BOWEL ELIMINATION Goal: RH STG MANAGE BOWEL WITH ASSISTANCE Description: STG Manage Bowel with mod Assistance. Outcome: Progressing Goal: RH STG MANAGE BOWEL W/MEDICATION W/ASSISTANCE Description: STG Manage Bowel with Medication with mod I Assistance. Outcome: Progressing   Problem: RH BLADDER ELIMINATION Goal: RH STG MANAGE BLADDER WITH ASSISTANCE Description: STG Manage Bladder With mod Assistance Outcome: Progressing   Problem: RH SKIN INTEGRITY Goal: RH STG SKIN FREE OF INFECTION/BREAKDOWN Description: No additional breakdown will occur while iin rehab with mod assistance  Outcome: Progressing Goal: RH STG MAINTAIN SKIN INTEGRITY WITH ASSISTANCE Description: STG Maintain Skin Integrity With mod Assistance. Outcome: Progressing   Problem: RH SAFETY Goal: RH STG ADHERE TO SAFETY PRECAUTIONS W/ASSISTANCE/DEVICE Description: STG Adhere to Safety Precautions With mod Assistance/Device. Outcome: Progressing   Problem: RH PAIN MANAGEMENT Goal: RH STG PAIN MANAGED AT OR BELOW PT'S PAIN GOAL Description: Pt will report pain of less than 4 with min assistance Outcome: Progressing   Problem: RH KNOWLEDGE DEFICIT Goal: RH STG INCREASE KNOWLEDGE OF HYPERTENSION Description: Family will review stroke book and be able to identify factors related to hypertension with min assist Outcome: Progressing Goal: RH STG INCREASE KNOWLEDGE OF STROKE PROPHYLAXIS Description: Family will be able to identify stroke prophylaxis with min assist Outcome: Progressing   Problem: RH KNOWLEDGE DEFICIT Goal: RH STG INCREASE KNOWLEGDE OF HYPERLIPIDEMIA Description: Family will be able to identify factors related to HLD  and secondary stroke, dietary restrictions and medication management for with min assist  Outcome: Progressing   Problem: RH  Vision Goal: RH LTG Vision (Specify) Outcome: Progressing

## 2019-08-26 NOTE — Progress Notes (Signed)
Physical Therapy Session Note  Patient Details  Name: Caitlin Vasquez MRN: [id number] Date of Birth: October 26, 1945  Today's Date: 08/26/2019 PT Individual Time: 5809-9833 PT Individual Time Calculation (min): 30 min   Short Term Goals: Week 1:  PT Short Term Goal 1 (Week 1): Pt will perform supine<>sit with mod assist PT Short Term Goal 2 (Week 1): Pt will be able to maintain midline orientation in sitting for with no more than min assist of 1 PT Short Term Goal 3 (Week 1): Pt will perform sit<>stand with +2 mod assist PT Short Term Goal 4 (Week 1): Pt will perform bed<>chair transfers with +2 max assist Week 2:    Week 3:     Skilled Therapeutic Interventions/Progress Updates:    PAIN Denies pain Pt initially supine and agreeable to treament.  Pt supine to side to sit w/mod assist and cues, pushes w/RUE w/strong L lean.  Pt able to self correct w/visual and verbal cues but maintains only briefly. Worked on reaching to R w/return to midline, sit to side on R elbow w/return to midline, finds midline but maintains briefly before leaning heavily to L.  Shoes donned dependently w/mod assist for sitting balance, cues for midline. STS from edge of bed repeated x 5 w/max assist to maintain upright/stabilize LLE, returns to sitting almost immediately.   SPT to wc w/mod assist, pt moves impulsively.  In wc continued w/trunk control activities as well as LLE therex w/multimodal cues to attend to LLE.  Moves ataxically. Pt requests to drink during session, small sips w/cues for upright posture, thickened water.  Pt left oob in wc w/alarm belt set and needs in reach   Therapy Documentation Precautions:  Precautions Precautions: Fall, Other (comment) Precaution Comments: supplemental O2, L side inattention, L visual field deficits, left side sensory deficits, left hemiparesis, pusher to the left Restrictions Weight Bearing Restrictions: No LLE Weight Bearing: Weight bearing as  tolerated    Therapy/Group: Individual Therapy  [name], PT   [name] 08/26/2019, 4:30 PM

## 2019-08-27 ENCOUNTER — Inpatient Hospital Stay (HOSPITAL_COMMUNITY): Payer: Medicare Other | Admitting: Occupational Therapy

## 2019-08-27 ENCOUNTER — Inpatient Hospital Stay (HOSPITAL_COMMUNITY): Payer: Medicare Other | Admitting: Physical Therapy

## 2019-08-27 LAB — GLUCOSE, CAPILLARY
Glucose-Capillary: 93 mg/dL (ref 70–99)
Glucose-Capillary: 126 mg/dL — ABNORMAL HIGH (ref 70–99)
Glucose-Capillary: 104 mg/dL — ABNORMAL HIGH (ref 70–99)
Glucose-Capillary: 110 mg/dL — ABNORMAL HIGH (ref 70–99)

## 2019-08-27 MED ORDER — SODIUM CHLORIDE 0.9 % IV SOLN
INTRAVENOUS | Status: DC
  Filled 2019-08-27 (×4): qty 1000

## 2019-08-27 MED ORDER — SODIUM CHLORIDE 0.9 % IV SOLN: INTRAVENOUS | Status: DC

## 2019-08-27 MED ORDER — SENNA 8.6 MG PO TABS: 1.0000 | ORAL_TABLET | Freq: Every day | ORAL | Status: DC | PRN

## 2019-08-27 NOTE — Progress Notes (Signed)
[place] PHYSICAL MEDICINE & REHABILITATION PROGRESS NOTE   Subjective/Complaints:   No issues with retention but has freq urinary incont, has had bowel incont with loose stools Also with tachypnea but no hypoxia or CP   ROS: Limited due to cognitive/behavioral    Objective:   No results found. Recent Labs    08/26/19 1247  WBC 11.3*  HGB 13.6  HCT 43.7  PLT 291   Recent Labs    08/26/19 1247  NA 140  K 3.9  CL 104  CO2 21*  GLUCOSE 116*  BUN 31*  CREATININE 1.24*  CALCIUM 8.9    Intake/Output Summary (Last 24 hours) at 08/27/2019 0819 Last data filed at 08/26/2019 1900 Gross per 24 hour  Intake 270 ml  Output 300 ml  Net -30 ml     Physical Exam: Vital Signs Blood pressure (!) 163/73, pulse 76, temperature 98 F (36.7 C), resp. rate 20, weight 56 kg, SpO2 100 %.  General: No acute distress Mood and affect are appropriate Heart: Regular rate and rhythm no rubs murmurs or extra sounds Lungs: Clear to auscultation, breathing unlabored, no rales or wheezes Abdomen: Positive bowel sounds, soft nontender to palpation, nondistended Extremities: No clubbing, cyanosis, or edema Skin: No evidence of breakdown, no evidence of rash Motor 4/5 Left side with absent proprioception and LT on left, 5/5 strength on RIght side   Assessment/Plan: 1. Functional deficits secondary to cerebellar hemorrhage which require 3+ hours per day of interdisciplinary therapy in a comprehensive inpatient rehab setting.  Physiatrist is providing close team supervision and 24 hour management of active medical problems listed below.  Physiatrist and rehab team continue to assess barriers to discharge/monitor patient progress toward functional and medical goals  Care Tool:  Bathing    Body parts bathed by patient: Left arm, Chest, Abdomen, Front perineal area, Right upper leg, Left upper leg, Face   Body parts bathed by helper: Right lower leg, Left lower leg, Right arm, Buttocks      Bathing assist Assist Level: 2 Helpers     Upper Body Dressing/Undressing Upper body dressing   What is the patient wearing?: Pull over shirt    Upper body assist Assist Level: Maximal Assistance - Patient 25 - 49%    Lower Body Dressing/Undressing Lower body dressing      What is the patient wearing?: Pants, Incontinence brief     Lower body assist Assist for lower body dressing: 2 Helpers     Toileting Toileting    Toileting assist Assist for toileting: 2 Helpers     Transfers Chair/bed transfer  Transfers assist  Chair/bed transfer activity did not occur: Safety/medical concerns  Chair/bed transfer assist level: Maximal Assistance - Patient 25 - 49%     Locomotion Ambulation   Ambulation assist   Ambulation activity did not occur: Safety/medical concerns          Walk 10 feet activity   Assist  Walk 10 feet activity did not occur: Safety/medical concerns        Walk 50 feet activity   Assist Walk 50 feet with 2 turns activity did not occur: Safety/medical concerns         Walk 150 feet activity   Assist Walk 150 feet activity did not occur: Safety/medical concerns         Walk 10 feet on uneven surface  activity   Assist Walk 10 feet on uneven surfaces activity did not occur: Safety/medical concerns  Wheelchair     Assist Will patient use wheelchair at discharge?: No (No Long term goals set by PT)             Wheelchair 50 feet with 2 turns activity    Assist            Wheelchair 150 feet activity     Assist          Blood pressure (!) 163/73, pulse 76, temperature 98 F (36.7 C), resp. rate 20, weight 56 kg, SpO2 100 %.  Medical Problem List and Plan: 1.  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  Antithrombotics: -DVT/anticoagulation:  Mechanical: Sequential compression devices, below knee  Bilateral lower extremities  Vascular ultrasound negative for DVT             -antiplatelet therapy: N/a due to bleed.  3. Pain Management: Back pain resolved with treatment of UTI. Discontinue hydrocodone and will use tramadol or tylenol prn for now.  4. Anxiety disorder/Mood: Buspar , decreased to 2.5mg  bid on 7/17 d/t concerns over lethargy             -antipsychotic agents: N/a 5. Neuropsych: This patient is not fully capable of making decisions on her own behalf. 6. Skin/Wound Care:  Routine pressure relief measures.  7. Fluids/Electrolytes/Nutrition: Strict I/O.  8. NSTEMI with fluid overload: Treated medically with Coreg, Lisinopril and Lipitor.  9. Severe COPD: Encourage pulmonary hygiene--continue flutter. Wean oxygen to off as able as has not use it for past year per son. Albuterol MDI prn. 10. E coli UTI: Has completed 7 day antibiotic course on 07/11.  11. HTN: SBP goal <160. Monitor BP tid--on Norvasc and HCTZ.   BP continues to be much better controlled now off Labetalol. Vitals:   08/26/19 2006 08/27/19 0324  BP: 125/65 (!) 163/73  Pulse: 75 76  Resp: 20 20  Temp: 98.1 F (36.7 C) 98 F (36.7 C)  SpO2: 98% 100%  elevated this am monitor now on Flomax which may lower BPs  12. Lethargy: Multifactorial?   -will decrease buspar to 2.5mg  per discussion with son 12. Urinary retention: Continue monitor voiding with PVR checks/bladder scans 4-6 hours and I/O caht to keep volumes <300 cc.   Flomax started  Pt now voiding, has urinary frequency , toileting program  14. A fib: Not AC candidate due to hemorrhage.  Rate controlled. 15. Dysphagia: On dysphagia 3 nectars.  Advance diet as tolerated 16. Tachypnea:   Xopenex BID   Improving- still with DOE Sat nl on RA, lung exam mainly with reduced BS at bases IS   17.  Bowel incont with loose BM   LOS: 8 days A FACE TO FACE EVALUATION WAS PERFORMED  Erick Colace 08/27/2019, 8:19 AM

## 2019-08-27 NOTE — Progress Notes (Signed)
Occupational Therapy Session Note  Patient Details  Name: Caitlin Vasquez MRN: [id number] Date of Birth: Dec 30, 1945  Today's Date: 08/27/2019 OT Individual Time: 2426-8341 OT Individual Time Calculation (min): 41 min    Short Term Goals: Week 2:  OT Short Term Goal 1 (Week 2): Pt will maintain static sitting balance for 3 mins EOB with min assist in preparation for selfcare tasks. OT Short Term Goal 2 (Week 2): Pt will complete UB bathing in supported sitting with no more than min assist for two consecutive sessions. OT Short Term Goal 3 (Week 2): Pt will locate grooming and bathing items placed left of midline with no more than mod instructional cueing. OT Short Term Goal 4 (Week 2): Pt will donn a pullover shirt with mod assist following hemi dressing techniques.  Skilled Therapeutic Interventions/Progress Updates:    Session 1: (9622-2979) Pt sitting in the tilt in space wheelchair to begin session.  Took her down to the dayroom for work on sitting balance EOM.  She needed max assist for squat pivot transfer to the therapy mat using [employer].  Placed mirror in front of her for feedback on sitting posture.  She continues to demonstrate severe pusher tendencies to the left with forward trunk flexion present as well.  Had therapy tech sit on the right side to give her a target to bring her right shoulder to in sitting.  Had her scan left of midline to locate colored bean bags and then pick up and place in the container on the right side for promotion of weightshifting to the right toward the pushing side.  She needed overall max assist for dynamic sitting balance and for return back to midline for several repetitions.  Pt voicing fatigue throughout session with request to go back to bed.  Therapist assisted with transfer back to the bed at the end of the session with max assist for stand pivot transfer to the right and max assist for sit to supine.  She was left with the call button and phone  in reach and safety alarm in place.    Session 2: (8921-1941)  Pt completed supine to sit EOB with mod assist.  She needed max assist for sitting balance secondary to increased LOB to the left.  Noted pt with increased bladder and bowel in continence in her brief.  Had her work on sit to stand and standing balance in order to complete peri hygiene as well as donning new brief.  She needed total assist +2 (pt 30%) for completion of toilet hygiene as well as donning new brief.  Increased trunk and cervical flexion in standing noted as well, with 3-4 attempts of standing needed to complete task.  Also had her perform stand pivot transfer to the wheelchair with max assist in order to change her sheets.  Finished session with transfer from the wheelchair back to the bed to supine.  Pt was left with the call button and phone in reach as well as the bed alarm being in place.    Therapy Documentation Precautions:  Precautions Precautions: Fall, Other (comment) Precaution Comments: supplemental O2, L side inattention, L visual field deficits, left side sensory deficits, left hemiparesis, pusher to the left Restrictions Weight Bearing Restrictions: No LLE Weight Bearing: Weight bearing as tolerated  Pain: Pain Assessment Pain Scale: Faces Pain Score: 0-No pain ADL: See Care Tool Section for some details of mobility and selfcare  Therapy/Group: Individual Therapy  Mihail Prettyman OTR/L 08/27/2019, 12:41 PM

## 2019-08-27 NOTE — Progress Notes (Signed)
Physical Therapy Weekly Progress Note  Patient Details  Name: Caitlin Vasquez MRN: [id number] Date of Birth: Aug 04, 1945  Beginning of progress report period: August 20, 2019 End of progress report period: August 27, 2019  Today's Date: 08/27/2019 PT Individual Time: 7902-4097 PT Individual Time Calculation (min): 44 min   Patient has met 3 of 4 short term goals.  Caitlin Vasquez is progressing slowly with therapy due to significant impairments of L inattention, impaired visuospacial awareness, absent proprioception or light touch sensation of L hemibody as well as Pusher tendencies with impaired midline orientation. She currently performs supine<>sit with mod/max assist, sitting balance EOB with min assist for static and max/total assist for dynamic tasks due to L anterior lean/LOB with poor awareness and impaired balance recovery strategies  as well as +2 max assist for sit<>stand and squat pivot transfers. She also demonstrates overall impaired activity tolerance, fatiguing quickly.   Patient continues to demonstrate the following deficits muscle weakness, decreased cardiorespiratoy endurance and decreased oxygen support, impaired timing and sequencing, abnormal tone, unbalanced muscle activation, motor apraxia, decreased coordination and decreased motor planning, decreased visual perceptual skills and decreased visual motor skills, decreased attention to left, decreased initiation, decreased attention, decreased awareness, decreased problem solving, decreased safety awareness, decreased memory and delayed processing and decreased sitting balance, decreased standing balance, decreased postural control and decreased balance strategies and therefore will continue to benefit from skilled PT intervention to increase functional independence with mobility.  Patient progressing toward long term goals..  Continue plan of care.  PT Short Term Goals Week 1:  PT Short Term Goal 1 (Week 1): Pt will perform  supine<>sit with mod assist PT Short Term Goal 1 - Progress (Week 1): Progressing toward goal PT Short Term Goal 2 (Week 1): Pt will be able to maintain midline orientation in sitting for 50mnute with no more than min assist of 1 PT Short Term Goal 2 - Progress (Week 1): Met PT Short Term Goal 3 (Week 1): Pt will perform sit<>stand with +2 mod assist PT Short Term Goal 3 - Progress (Week 1): Met PT Short Term Goal 4 (Week 1): Pt will perform bed<>chair transfers with +2 max assist PT Short Term Goal 4 - Progress (Week 1): Met Week 2:  PT Short Term Goal 1 (Week 2): Pt will perform supine<>sit with mod assist PT Short Term Goal 2 (Week 2): Pt will perform bed<>chair transfers with max assist of 1 consistently PT Short Term Goal 3 (Week 2): Pt will perform sit<>stands with max assist of 1 consistently PT Short Term Goal 4 (Week 2): Pt will tolerate standing position using LRAD at least 30 seconds with max assist of 1  Skilled Therapeutic Interventions/Progress Updates:  Ambulation/gait training;Community reintegration;DME/adaptive equipment instruction;Neuromuscular re-education;Psychosocial support;Stair training;UE/LE Strength taining/ROM;Wheelchair propulsion/positioning;Balance/vestibular training;Discharge planning;Pain management;Functional electrical stimulation;Skin care/wound management;Therapeutic Activities;UE/LE Coordination activities;Cognitive remediation/compensation;Disease management/prevention;Functional mobility training;Patient/family education;Splinting/orthotics;Therapeutic Exercise;Visual/perceptual remediation/compensation   Pt received supine in bed with NT present assisting pt with meal - received and maintained on 3L of O2 via nasal cannula during session. Pt agreeable to therapy session and therapist assumed care of patient. Provided full supervision for meal while focusing on L attention via having pt incorporate L UE into the task and placing items on L side of tray to  promote visual scanning. MD in/out for morning assessment. Supine>sitting R EOB, using bed features, with mod assist for L hemibody management and trunk upright. Once sitting EOB requires max assist to prevent L anterior trunk LOB and max cuing and  manual facilitation for R lateral trunk lean on forearm support to maintain upright. Pt reports feeling urge to urinate. Sit>stand EOB>stedy with max assist for lifting and balance due to strong L anterior lean - pt continues to lack proprioception in L hemibody requiring max manual facilitation for safe positioning of those extremities during this transfer. +2 assist stedy transfer to [facility] as pt requires max assist of 1 for maintaining trunk upright due to strong L anterior lean and managing LUE and LE positioning. Standing in stedy with max assist of 1 while +2 assist performed total assist LB clothing management. Despite attempt pt unable to void on [facility]. +2 assist stedy transfer to [place] wheelchair again requiring max assist for trunk control and manual facilitation for L hemibody positioning for safety. Sitting in w/c donned pants max/total assist. Sit>stand w/c>HHA with +2 max assist to pull pants over hips. Pt left tilted back in TIS w/c with needs in reach, seat belt alarm on, and pillow supporting L UE - focusing on improved upright, OOB activity tolerance and improved cardiopulmonary endurance with plan for assistance back to bed after next therapy session.  Therapy Documentation Precautions:  Precautions Precautions: Fall, Other (comment) Precaution Comments: supplemental O2, L side inattention, L visual field deficits, left side sensory deficits, left hemiparesis, pusher to the left Restrictions Weight Bearing Restrictions: No LLE Weight Bearing: Weight bearing as tolerated  Pain:  No reports of complaints of pain during session.  Therapy/Group: Individual Therapy  Tawana Scale , PT, DPT, CSRS  08/27/2019, 7:49 AM

## 2019-08-27 NOTE — Progress Notes (Signed)
Intake has been poor--did not eat lunch but son fed her some chicken salad per reports and question intake at breakfast. Will order strict I/O and intake poor. Will order IVF at nights for hydration as renal status worsening with variable intake. Will recheck labs in am.

## 2019-08-27 NOTE — Plan of Care (Signed)
  Problem: Consults Goal: RH STROKE PATIENT EDUCATION Description: See Patient Education module for education specifics  Outcome: Progressing   Problem: RH BOWEL ELIMINATION Goal: RH STG MANAGE BOWEL WITH ASSISTANCE Description: STG Manage Bowel with mod Assistance. Outcome: Progressing Goal: RH STG MANAGE BOWEL W/MEDICATION W/ASSISTANCE Description: STG Manage Bowel with Medication with mod I Assistance. Outcome: Progressing   Problem: RH BLADDER ELIMINATION Goal: RH STG MANAGE BLADDER WITH ASSISTANCE Description: STG Manage Bladder With mod Assistance Outcome: Progressing   Problem: RH SKIN INTEGRITY Goal: RH STG SKIN FREE OF INFECTION/BREAKDOWN Description: No additional breakdown will occur while iin rehab with mod assistance  Outcome: Progressing Goal: RH STG MAINTAIN SKIN INTEGRITY WITH ASSISTANCE Description: STG Maintain Skin Integrity With mod Assistance. Outcome: Progressing   Problem: RH SAFETY Goal: RH STG ADHERE TO SAFETY PRECAUTIONS W/ASSISTANCE/DEVICE Description: STG Adhere to Safety Precautions With mod Assistance/Device. Outcome: Progressing   Problem: RH PAIN MANAGEMENT Goal: RH STG PAIN MANAGED AT OR BELOW PT'S PAIN GOAL Description: Pt will report pain of less than 4 with min assistance Outcome: Progressing   Problem: RH KNOWLEDGE DEFICIT Goal: RH STG INCREASE KNOWLEDGE OF HYPERTENSION Description: Family will review stroke book and be able to identify factors related to hypertension with min assist Outcome: Progressing Goal: RH STG INCREASE KNOWLEDGE OF STROKE PROPHYLAXIS Description: Family will be able to identify stroke prophylaxis with min assist Outcome: Progressing   Problem: RH KNOWLEDGE DEFICIT Goal: RH STG INCREASE KNOWLEGDE OF HYPERLIPIDEMIA Description: Family will be able to identify factors related to HLD  and secondary stroke, dietary restrictions and medication management for with min assist  Outcome: Progressing   Problem: RH  Vision Goal: RH LTG Vision (Specify) Outcome: Progressing   

## 2019-08-28 ENCOUNTER — Inpatient Hospital Stay (HOSPITAL_COMMUNITY): Payer: Medicare Other | Admitting: Occupational Therapy

## 2019-08-28 ENCOUNTER — Inpatient Hospital Stay (HOSPITAL_COMMUNITY): Payer: Medicare Other | Admitting: Speech Pathology

## 2019-08-28 ENCOUNTER — Inpatient Hospital Stay (HOSPITAL_COMMUNITY): Payer: Medicare Other | Admitting: Physical Therapy

## 2019-08-28 LAB — BASIC METABOLIC PANEL
Anion gap: 11 (ref 5–15)
BUN: 31 mg/dL — ABNORMAL HIGH (ref 8–23)
CO2: 23 mmol/L (ref 22–32)
Calcium: 8.6 mg/dL — ABNORMAL LOW (ref 8.9–10.3)
Chloride: 104 mmol/L (ref 98–111)
Creatinine, Ser: 1.06 mg/dL — ABNORMAL HIGH (ref 0.44–1.00)
GFR calc Af Amer: 59 mL/min — ABNORMAL LOW (ref >60)
GFR calc non Af Amer: 51 mL/min — ABNORMAL LOW (ref >60)
Glucose, Bld: 99 mg/dL (ref 70–99)
Potassium: 4.1 mmol/L (ref 3.5–5.1)
Sodium: 138 mmol/L (ref 135–145)

## 2019-08-28 LAB — GLUCOSE, CAPILLARY
Glucose-Capillary: 85 mg/dL (ref 70–99)
Glucose-Capillary: 152 mg/dL — ABNORMAL HIGH (ref 70–99)
Glucose-Capillary: 106 mg/dL — ABNORMAL HIGH (ref 70–99)
Glucose-Capillary: 118 mg/dL — ABNORMAL HIGH (ref 70–99)

## 2019-08-28 MED ORDER — MEGESTROL ACETATE 400 MG/10ML PO SUSP
400.0000 mg | Freq: Two times a day (BID) | ORAL | Status: DC
  Administered 2019-08-28 – 2019-09-12 (×31): 400 mg via ORAL
  Filled 2019-08-28 (×31): qty 10

## 2019-08-28 MED ORDER — ALBUTEROL SULFATE (2.5 MG/3ML) 0.083% IN NEBU
2.5000 mg | INHALATION_SOLUTION | Freq: Four times a day (QID) | RESPIRATORY_TRACT | Status: DC
  Administered 2019-08-28 – 2019-08-31 (×13): 2.5 mg via RESPIRATORY_TRACT
  Filled 2019-08-28 (×13): qty 3

## 2019-08-28 NOTE — Progress Notes (Signed)
Dr. Algie Coffer called in order for scheduled nebs.

## 2019-08-28 NOTE — Progress Notes (Signed)
Patient ID: Caitlin Vasquez, female   DOB: 1945-04-03, 74 y.o.   MRN: [id number]  Team Conference Report to Patient/Family  Team Conference discussion was reviewed with the patient and caregiver, including goals, any changes in plan of care and target discharge date.  Patient and caregiver express understanding and are in agreement.  The patient has a target discharge date of 09/12/19.  Andria Rhein 08/28/2019, 1:46 PM

## 2019-08-28 NOTE — Progress Notes (Signed)
SAT 88% on 1L bumped up to 1.5L of O2 via [place], administered PRN neb tx, pt having inspr/exp breath sounds bilaterally, very congested, pt unable to have productive cough, offered flutter valve. Improvement noted to O2 and breath sounds bilaterally.

## 2019-08-28 NOTE — Progress Notes (Signed)
Occupational Therapy Session Note  Patient Details  Name: Caitlin Vasquez MRN: [id number] Date of Birth: 04-Jun-1945  Today's Date: 08/28/2019 OT Individual Time: 4315-4008 OT Individual Time Calculation (min): 49 min    Short Term Goals: Week 2:  OT Short Term Goal 1 (Week 2): Pt will maintain static sitting balance for 3 mins EOB with min assist in preparation for selfcare tasks. OT Short Term Goal 2 (Week 2): Pt will complete UB bathing in supported sitting with no more than min assist for two consecutive sessions. OT Short Term Goal 3 (Week 2): Pt will locate grooming and bathing items placed left of midline with no more than mod instructional cueing. OT Short Term Goal 4 (Week 2): Pt will donn a pullover shirt with mod assist following hemi dressing techniques.  Skilled Therapeutic Interventions/Progress Updates:    Pt in bed to start session sleeping.  She needed max assist to wake up with noted bladder incontinence seen in the brief.  Worked on washing peri area as well as donning new brief and pants supine rolling.  She was able to wash the front peri area with setup and min instructional cueing.  Mod assist for rolling side to side with total assist for donning brief and for donning pants.  Max assist for transition to sitting from right sidelying with increased pushing to the left.  She needed total assist for squat pivot transfer to the wheelchair for self feeding.  She needed mod instructional cueing for scanning left of the tray for location of times.  Mod instructional cueing was also needed for slowing down and smaller bites.  Noted with drinking from cup, liquids would pour out of the left side of her mouth without her demonstrating any awareness of this.  She was able to self feed with the LUE.  Finished session with pt up in the tilt in space wheelchair with safety belt in place and call button and phone in reach.    Therapy Documentation Precautions:  Precautions Precautions:  Fall, Other (comment) Precaution Comments: supplemental O2, L side inattention, L visual field deficits, left side sensory deficits, left hemiparesis, pusher to the left Restrictions Weight Bearing Restrictions: No LLE Weight Bearing: Weight bearing as tolerated  Pain: Pain Assessment Pain Scale: Faces Pain Score: 0-No pain ADL: See Care Tool Section for details of mobility and selfcare  Therapy/Group: Individual Therapy  Jamare Vanatta OTR/L 08/28/2019, 10:38 AM

## 2019-08-28 NOTE — Progress Notes (Signed)
Patient was heard coughing from the hallway. Went in & patient was wheezing & coughing. Patient was sat up & RT was called. RT stated that she was on another floor. Nebulizer treatment was given, patient was still congested after given but oxygen saturation was up to 100% & is coughing less. Cough sounds productive but patient refused oral  suctioning. Informed patients nurse about nebulizer treatment & asked if she could be given her prn robitussin. Patient was instructed to deep breath & cough. She is a mouth breather. Instructed to breath in through her nose & out through her mouth. Patient did so while in the room. No acute distress noted at this time.

## 2019-08-28 NOTE — Patient Care Conference (Signed)
Inpatient RehabilitationTeam Conference and Plan of Care Update Date: 08/28/2019   Time: 1:57 PM    Patient Name: Caitlin Vasquez      Medical Record Number: [id number]  Date of Birth: 1945/04/11 Sex: Female         Room/Bed: 4W16C/4W16C-01 Payor Info: Payor: MEDICARE / Plan: MEDICARE PART A AND B / Product Type: *No Product type* /    Admit Date/Time:  08/19/2019  3:23 PM  Primary Diagnosis:  Cerebellar hemorrhage [facility])  Hospital Problems: Principal Problem:   Cerebellar hemorrhage (HCC) Active Problems:   Tachypnea   Urinary retention   Labile blood pressure    Expected Discharge Date: Expected Discharge Date: 09/12/19  Team Members Present: Care Coodinator Present: Chana Bode, RN, BSN, CRRN;Christina Vita Barley, BSW Nurse Present: Other (comment) Roseanne Reno , RN) PT Present: Casimiro Needle, PT OT Present: Perrin Maltese, OT SLP Present: Suzzette Righter, CF-SLP PPS Coordinator present : Fae Pippin, SLP     Current Status/Progress Goal Weekly Team Focus  Bowel/Bladder   Incontinent of bowel and bladder, Still requiring Q6 PVRs  Regain continence of bowel and bladder  Toileting Q2 while awake, assess q shift and PRN   Swallow/Nutrition/ Hydration   Dys 3, nectar, participating in thin trials, full supervision  Supervision A  independence with use swallow strategies, MBS, regular trials as appropriate   ADL's   Mod assist for UB bathing support with max assist for UB dressing.  total assist to total +2 for LB selfcare sit to stand as well as toileting.  Transfers are max assist squat pivot.  Pt still with left neglect and left visual field deficit.  Needs max hand over hand assist with the LUE secondary to increased apraxia and lack of sensation  min to mod assist  selfcare retraining, transfer training, neuromuscular re-education, therapeutic activites, DME education, balance retraining   Mobility   mod/max assist supine<>sit, static sitting balance with min/mod assist  and total assist dynamic sitting balance due to strong L anterior lean/LOB with pusher tendencies, +2 max assist for sit<>stands and squat pivot transfers  min assist overall  activity tolerance, midline orientation, L attention, sitting balance/trunk control, sit<>stand and bed<>chair transfer training, pt education   Communication             Safety/Cognition/ Behavioral Observations  Max left inattention and recall, Mod-Max basic problem solving, attention, intellectual awareness  Supervision A  visual scanning, functional problem solving, attention, awareness, recall with aids   Pain   No complaints of pain  to remain pain free  assess q shift and prn   Skin   MASD to buttocks, applying barrier cream  promote healing, prevent further breakdown and maintain skin integrity  assess q shift and prn     Team Discussion:  Discharge Planning/Teaching Needs:  Goal to discharge home with son Sharia Reeve) and daughter in law to provide care  Will schedule with family if reccomended   Current Update: on target  Current Barriers to Discharge:  Incontinence and Nutritional means   Patient experiencing fatigue; nausea Tachypnea with Oxygen support  Possible Resolutions to Barriers: MD to add Megace and encourage po fluids Toileting protocol continued MD placed order for Incentive Spirometer   Patient on target to meet rehab goals: yes  *See Care Plan and progress notes for long and short-term goals.   Revisions to Treatment Plan:  15/7 therapy schedule Custom wheelchair order placed Plan for several days of family education PTD to review assistance needed and max  assist for scanning left, and intellectual awareness.    Medical Summary Current Status: tachypnea but O2 sats ok, left neglect persists, atelectasis Weekly Focus/Goal: improve urinary cont, appetite  Barriers to Discharge: Medical stability   Possible Resolutions to Barriers: improve fatigue, trial of thin liquids, IS for  atlectasis   Continued Need for Acute Rehabilitation Level of Care: The patient requires daily medical management by a physician with specialized training in physical medicine and rehabilitation for the following reasons: Direction of a multidisciplinary physical rehabilitation program to maximize functional independence : Yes Medical management of patient stability for increased activity during participation in an intensive rehabilitation regime.: Yes Analysis of laboratory values and/or radiology reports with any subsequent need for medication adjustment and/or medical intervention. : Yes   I attest that I was present, lead the team conference, and concur with the assessment and plan of the team.   Chana Bode B 08/28/2019, 1:57 PM

## 2019-08-28 NOTE — Progress Notes (Signed)
Occupational Therapy Session Note  Patient Details  Name: Caitlin Vasquez MRN: [id number] Date of Birth: [birth year]  Today's Date: 08/28/2019 OT Individual Time: 1135-1200 OT Individual Time Calculation (min): 25 min    Short Term Goals: Week 1:  OT Short Term Goal 1 (Week 1): Pt will maintain static sitting balance for 3 mins EOB with min assist in preparation for selfcare tasks. OT Short Term Goal 1 - Progress (Week 1): Not met OT Short Term Goal 2 (Week 1): Pt will complete UB bathing in supported sitting with no more than min assist for two consecutive sessions. OT Short Term Goal 2 - Progress (Week 1): Not met OT Short Term Goal 3 (Week 1): Pt will locate grooming and bathing items placed left of midline with no more than mod instructional cueing. OT Short Term Goal 3 - Progress (Week 1): Not met OT Short Term Goal 4 (Week 1): Pt will donn a pullover shirt with mod assist following hemi dressing techniques. OT Short Term Goal 4 - Progress (Week 1): Not met  Skilled Therapeutic Interventions/Progress Updates:    Pt received in wc with her son in the room.  +2 A today.  Pt sat upright in wc to work on postural control in the chair.  Placed B elbows on table in front of her and worked on midline control with cues to lean her R towards the rehab tech helping.  With elbows on table, worked on a/arom of L elbow with moving her scrunchie on table.  Pt needed frequent cues to attend to task.  Tactile cues to keep head elevated and sit upright.  Pt requested to lay down but lunch was about to arrive,  Instead positioned chair into recline and positioned her L arm and head with pillows.  Pt in chair with all needs met and alarm belt on.   Therapy Documentation Precautions:  Precautions Precautions: Fall, Other (comment) Precaution Comments: supplemental O2, L side inattention, L visual field deficits, left side sensory deficits, left hemiparesis, pusher to the left Restrictions Weight Bearing  Restrictions: No LLE Weight Bearing: Weight bearing as tolerated  Pain: Pain Assessment Pain Scale: Faces Pain Score: 0-No pain Faces Pain Scale: No hurt    Therapy/Group: Individual Therapy  [place] 08/28/2019, 12:32 PM

## 2019-08-28 NOTE — Progress Notes (Signed)
Physical Therapy Session Note  Patient Details  Name: Caitlin Vasquez MRN: [id number] Date of Birth: 10-06-1945  Today's Date: 08/28/2019 PT Individual Time: 3785-8850 PT Individual Time Calculation (min): 42 min   Short Term Goals: Week 2:  PT Short Term Goal 1 (Week 2): Pt will perform supine<>sit with mod assist PT Short Term Goal 2 (Week 2): Pt will perform bed<>chair transfers with max assist of 1 consistently PT Short Term Goal 3 (Week 2): Pt will perform sit<>stands with max assist of 1 consistently PT Short Term Goal 4 (Week 2): Pt will tolerate standing position using LRAD at least 30 seconds with max assist of 1  Skilled Therapeutic Interventions/Progress Updates:   Pt received tilted back in TIS w/c with her son, Sharia Reeve, present and pt requesting to lay down but with minimal encouragement agreeable to therapy session with plan to assist pt back to bed at end. Therapist educated pt's son on plan for hands-on training/education closer to discharge date as well as potential need for custom wheelchair evaluation pending pt's progress. Pt received and maintained on 3L of O2 via nasal cannula during session. Transported to/from gym in w/c for time management and energy conservation. L squat pivot w/c>EOM max assist of 1 and min assist of 2nd person for lifting/pivoting hips and trunk control - max cuing for L attention to locate mat then R trunk lean for head/hips relationship. Sitting EOM max assist initially to prevent L anterior trunk lean/LOB - mod/max cuing for R hand placement to improve midline orientation/decrease pushing - therapist facilitating L UE positioning to promote weight bearing for NMR and to assist with trunk control - static sitting with close supervision and intermittent min assist. Tolerated sitting EOM ~57minutes. Participated in L attention and L visual scanning task to identify # of colored lego blocks progressed to cross body reaching with R UE to grasp item then reach  back to R promoting R weight shift (avoiding pushing) to hand it to +2 assist - attempted to grasp item with L hand but due to L inattention, inability to sustain attention, and impaired L UE sensation and strength pt unable to grab lego block. Pt noted to have labored breathing with increased wheezing this afternoon - provided supported seated rest break while assessing SpO2 via dynamap - reading >90% but unable to get consistent wave form that would indicate a valid reading - provided supported seated rest break to decrease symptoms. R squat pivot to w/c max assist of 1 for lifting/pivoting hips. Pt noted to be incontinent of bladder. Transported back to room. R squat pivot w/c>EOB max assist of 1 and +2 min assist lifting/pivoting hips. Sit>supine mod assist B LE management into bed. Rolling R/L in bed mod assist while performing total assist LB clothing management and peri-care - incontinent of bladder and bowels. Repositioned in supine with L UE supported on pillow and RN present to assume care of patient.  Therapy Documentation Precautions:  Precautions Precautions: Fall, Other (comment) Precaution Comments: supplemental O2, L side inattention, L visual field deficits, left side sensory deficits, left hemiparesis, pusher to the left Restrictions Weight Bearing Restrictions: No LLE Weight Bearing: Weight bearing as tolerated  Pain:   No reports of pain throughout session.   Therapy/Group: Individual Therapy  Ginny Forth , PT, DPT, CSRS  08/28/2019, 6:12 PM

## 2019-08-28 NOTE — Progress Notes (Signed)
Speech Language Pathology Daily Session Note  Patient Details  Name: Caitlin Vasquez MRN: [id number] Date of Birth: 02/27/1945  Today's Date: 08/28/2019 SLP Individual Time: 2025-4270 SLP Individual Time Calculation (min): 28 min  Short Term Goals: Week 2: SLP Short Term Goal 1 (Week 2): Pt will scan left of midline during functional tasks with mod A verbal and visual cues. SLP Short Term Goal 2 (Week 2): Pt will demonstrate sustained attention in 10 minute intervals during functional tasks with min A verbal cues for redirection, SLP Short Term Goal 3 (Week 2): Pt will idenitfy 1 swallow or cognitive and 1 phsyical deficits from acute injury with max A verbal cues. SLP Short Term Goal 4 (Week 2): Pt will use compensatory aids or strategies to recall daily information with Mod A cues. SLP Short Term Goal 5 (Week 2): Pt will demonstrate ability to problem solving functional basic situations with Mod A multimodal cues. SLP Short Term Goal 6 (Week 2): Pt will consume trials of thin liquids with minmal overt s/sx aspiration X3 prior to repeat MBSS.  Skilled Therapeutic Interventions: Pt was seen for skilled ST targeting dysphagia and cognitive goals. Pt alert and upright in chair today and her son was present and receptive to education throughout session. After completing oral care via suction toothbrush, she accepted trials of thin H2O from a cup with 1 immediate cough response after a large bolus. Discussed taking smaller sips and slow rate to coordinate breathing and swallowing in context of COPD dx. Recommend repeat MBSS tomorrow to reassess oropharyngeal swallow function with thin liquids. Continue current diet for now. Pt required Mod A verbal and visual cues to use memory notebook to recall functional information from earlier ST sessions. Mod A verbal cues provided during functional conversation targeting intellectual awareness, during which pt identified swallowing as 1 ST goal; also reiterated  cognitive goals of ST with pt and son. Pt left sitting in chair with alarm set and needs within reach, son still present. Continue per current plan of care.          Pain Pain Assessment Pain Scale: Faces Faces Pain Scale: No hurt  Therapy/Group: Individual Therapy  [place] 08/28/2019, 7:11 AM

## 2019-08-28 NOTE — Progress Notes (Signed)
[facility] PHYSICAL MEDICINE & REHABILITATION PROGRESS NOTE   Subjective/Complaints:    ROS: Limited due to cognitive/behavioral    Objective:   No results found. Recent Labs    08/26/19 1247  WBC 11.3*  HGB 13.6  HCT 43.7  PLT 291   Recent Labs    08/26/19 1247 08/28/19 0656  NA 140 138  K 3.9 4.1  CL 104 104  CO2 21* 23  GLUCOSE 116* 99  BUN 31* 31*  CREATININE 1.24* 1.06*  CALCIUM 8.9 8.6*    Intake/Output Summary (Last 24 hours) at 08/28/2019 0826 Last data filed at 08/28/2019 0036 Gross per 24 hour  Intake 220 ml  Output --  Net 220 ml     Physical Exam: Vital Signs Blood pressure (!) 127/112, pulse 72, temperature 98.5 F (36.9 C), resp. rate 20, weight 56 kg, SpO2 96 %.  General: No acute distress Mood and affect are appropriate Heart: Regular rate and rhythm no rubs murmurs or extra sounds Lungs: Clear to auscultation, breathing unlabored, no rales or wheezes Abdomen: Positive bowel sounds, soft nontender to palpation, nondistended Extremities: No clubbing, cyanosis, or edema Skin: No evidence of breakdown, no evidence of rash Motor 4/5 Left side with absent proprioception and LT on left, 5/5 strength on RIght side   Assessment/Plan: 1. Functional deficits secondary to cerebellar hemorrhage which require 3+ hours per day of interdisciplinary therapy in a comprehensive inpatient rehab setting.  Physiatrist is providing close team supervision and 24 hour management of active medical problems listed below.  Physiatrist and rehab team continue to assess barriers to discharge/monitor patient progress toward functional and medical goals  Care Tool:  Bathing    Body parts bathed by patient: Left arm, Chest, Abdomen, Front perineal area, Right upper leg, Left upper leg, Face   Body parts bathed by helper: Right lower leg, Left lower leg, Right arm, Buttocks     Bathing assist Assist Level: 2 Helpers     Upper Body Dressing/Undressing Upper  body dressing   What is the patient wearing?: Pull over shirt    Upper body assist Assist Level: Maximal Assistance - Patient 25 - 49%    Lower Body Dressing/Undressing Lower body dressing      What is the patient wearing?: Pants, Incontinence brief     Lower body assist Assist for lower body dressing: 2 Helpers     Toileting Toileting    Toileting assist Assist for toileting: 2 Helpers     Transfers Chair/bed transfer  Transfers assist  Chair/bed transfer activity did not occur: Safety/medical concerns  Chair/bed transfer assist level: Dependent - mechanical lift (+2 assist with stedy)     Locomotion Ambulation   Ambulation assist   Ambulation activity did not occur: Safety/medical concerns          Walk 10 feet activity   Assist  Walk 10 feet activity did not occur: Safety/medical concerns        Walk 50 feet activity   Assist Walk 50 feet with 2 turns activity did not occur: Safety/medical concerns         Walk 150 feet activity   Assist Walk 150 feet activity did not occur: Safety/medical concerns         Walk 10 feet on uneven surface  activity   Assist Walk 10 feet on uneven surfaces activity did not occur: Safety/medical concerns         Wheelchair     Assist Will patient use wheelchair at discharge?:  No (No Long term goals set by PT)             Wheelchair 50 feet with 2 turns activity    Assist            Wheelchair 150 feet activity     Assist          Blood pressure (!) 127/112, pulse 72, temperature 98.5 F (36.9 C), resp. rate 20, weight 56 kg, SpO2 96 %.  Medical Problem List and Plan: 1.  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 Formulized individual treatment plan  based on medical history, underlying problem and comorbidities. 2.  Antithrombotics: -DVT/anticoagulation:  Mechanical: Sequential compression devices, below knee Bilateral lower extremities  Vascular ultrasound negative for DVT             -antiplatelet therapy: N/a due to bleed.  3. Pain Management: Back pain resolved with treatment of UTI. Discontinue hydrocodone and will use tramadol or tylenol prn for now.  4. Anxiety disorder/Mood: Buspar , decreased to 2.46m bid on 7/17 d/t concerns over lethargy             -antipsychotic agents: N/a 5. Neuropsych: This patient is not fully capable of making decisions on her own behalf. 6. Skin/Wound Care:  Routine pressure relief measures.  7. Fluids/Electrolytes/Nutrition: Strict I/O.  8. NSTEMI with fluid overload: Treated medically with Coreg, Lisinopril and Lipitor.  9. Severe COPD: Encourage pulmonary hygiene--continue flutter. Wean oxygen to off as able as has not use it for past year per son. Albuterol MDI prn. 10. E coli UTI: Has completed 7 day antibiotic course on 07/11.  11. HTN: SBP goal <160. Monitor BP tid--on Norvasc and HCTZ.   BP continues to be much better controlled now off Labetalol. Vitals:   08/27/19 1937 08/28/19 0419  BP: (!) 147/74 (!) 127/112  Pulse: 85 72  Resp: 20 20  Temp: 98.6 F (37 C) 98.5 F (36.9 C)  SpO2: 96%   Elevated diastolic x 1 will monitor for trend  12. Lethargy: Multifactorial?   -will decrease buspar to 2.560mper discussion with son 1332Urinary retention: Continue monitor voiding with PVR checks/bladder scans 4-6 hours and I/O caht to keep volumes <300 cc.   Flomax started  Pt now voiding, has urinary frequency , toileting program  14. A fib: Not AC candidate due to hemorrhage.  Rate controlled. 15. Dysphagia: On dysphagia 3 nectars.  Advance diet as tolerated 16. Tachypnea:   Xopenex BID   Improving- still with DOE Sat nl on RA, lung exam mainly with reduced BS at bases IS  10 puff q 1 hr 17.   Bowel incont with loose BM   LOS: 9 days A FACE TO FACE EVALUATION WAS PERFORMED  AnCharlett Blake/21/2021, 8:26 AM

## 2019-08-29 ENCOUNTER — Inpatient Hospital Stay (HOSPITAL_COMMUNITY): Payer: Medicare Other

## 2019-08-29 ENCOUNTER — Inpatient Hospital Stay (HOSPITAL_COMMUNITY): Payer: Medicare Other | Admitting: Physical Therapy

## 2019-08-29 ENCOUNTER — Encounter (HOSPITAL_COMMUNITY): Payer: Medicare Other | Admitting: Speech Pathology

## 2019-08-29 ENCOUNTER — Inpatient Hospital Stay (HOSPITAL_COMMUNITY): Payer: Medicare Other | Admitting: Occupational Therapy

## 2019-08-29 LAB — GLUCOSE, CAPILLARY
Glucose-Capillary: 104 mg/dL — ABNORMAL HIGH (ref 70–99)
Glucose-Capillary: 140 mg/dL — ABNORMAL HIGH (ref 70–99)
Glucose-Capillary: 106 mg/dL — ABNORMAL HIGH (ref 70–99)
Glucose-Capillary: 93 mg/dL (ref 70–99)

## 2019-08-29 NOTE — Progress Notes (Signed)
Occupational Therapy Session Note  Patient Details  Name: ALIZAH SILLS MRN: [id number] Date of Birth: 1946-02-03  Today's Date: 08/29/2019 OT Individual Time: 7209-4709 OT Individual Time Calculation (min): 30 min    Short Term Goals: Week 1:  OT Short Term Goal 1 (Week 1): Pt will maintain static sitting balance for 3 mins EOB with min assist in preparation for selfcare tasks. OT Short Term Goal 1 - Progress (Week 1): Not met OT Short Term Goal 2 (Week 1): Pt will complete UB bathing in supported sitting with no more than min assist for two consecutive sessions. OT Short Term Goal 2 - Progress (Week 1): Not met OT Short Term Goal 3 (Week 1): Pt will locate grooming and bathing items placed left of midline with no more than mod instructional cueing. OT Short Term Goal 3 - Progress (Week 1): Not met OT Short Term Goal 4 (Week 1): Pt will donn a pullover shirt with mod assist following hemi dressing techniques. OT Short Term Goal 4 - Progress (Week 1): Not met Week 2:  OT Short Term Goal 1 (Week 2): Pt will maintain static sitting balance for 3 mins EOB with min assist in preparation for selfcare tasks. OT Short Term Goal 2 (Week 2): Pt will complete UB bathing in supported sitting with no more than min assist for two consecutive sessions. OT Short Term Goal 3 (Week 2): Pt will locate grooming and bathing items placed left of midline with no more than mod instructional cueing. OT Short Term Goal 4 (Week 2): Pt will donn a pullover shirt with mod assist following hemi dressing techniques.      Skilled Therapeutic Interventions/Progress Updates:    Pt had a late start time due to receiving breathing treatment. She had just been changed by nursing after having a wet brief.   From supine with HOB partially raised, donned pants with total A but pt helping by bridging her hips partially. Donned pt's socks and shoes.Pt sat to EOB with mod A and then was able to maintain sitting balance with  only MIn A.  No leaning to the left.   Completed stand pivot to wc with max A of 2.  Once in w/c pt positioned and had her hair brushed.  Began setting pt up with belt alarm as the session time was over.  Transport arrived to take pt an exam.     Therapy Documentation Precautions:  Precautions Precautions: Fall, Other (comment) Precaution Comments: supplemental O2, L side inattention, L visual field deficits, left side sensory deficits, left hemiparesis, pusher to the left Restrictions Weight Bearing Restrictions: No LLE Weight Bearing: Weight bearing as tolerated    Vital Signs: Oxygen Therapy SpO2: 98 % O2 Device: Nasal Cannula O2 Flow Rate (L/min): 3 L/min Pain: Pain Assessment Pain Scale: 0-10 Pain Score: 0-No pain ADL: ADL Eating: Minimal assistance Where Assessed-Eating: Bed level Grooming: Minimal assistance Where Assessed-Grooming: Bed level Upper Body Bathing: Maximal assistance Where Assessed-Upper Body Bathing: Edge of bed Lower Body Bathing: Dependent Where Assessed-Lower Body Bathing: Edge of bed, Bed level Upper Body Dressing: Dependent Where Assessed-Upper Body Dressing: Edge of bed Lower Body Dressing: Dependent Where Assessed-Lower Body Dressing: Edge of bed Toileting: Dependent Where Assessed-Toileting: Bedside Commode Toilet Transfer: Dependent Toilet Transfer Method: Market researcher Equipment: Bedside commode   Therapy/Group: Individual Therapy  [place] 08/29/2019, 8:43 AM

## 2019-08-29 NOTE — Progress Notes (Signed)
Modified Barium Swallow Progress Note  Patient Details  Name: Caitlin Vasquez MRN: [id number] Date of Birth: April 28, 1945  Today's Date: 08/29/2019  Modified Barium Swallow completed.  Full report located under Chart Review in the Imaging Section.  Brief recommendations include the following:  Clinical Impression  Pt's oropharyngeal swallow function is largely unchanged from previous MBSS on 08/12/19. She demonstrates reduced oral control of liquid boluses as well as silent aspiration. Thin barium from cup and straw was consistently penetrated to the level of the vocal folds and not ejected, and aspirated without sensation X1 (PAS scale 5 and 8). Although piecemeal swallowing and delayed swallow initiation (liquid pools in valleculae and pyriform sinuses at times) occurs with both thin and nectar, pt's airway protection was increased with nectar thick liquids as opposed to thin. Nectar barium did pass below the vocal folds X1 during the swallow, however all material was ejected from vestibule and trachea during the swallow. Pt became increasingly lethargic throughout study and was unable to follow commands for any compensatory strategies, or to self monitor bolus size (previous MBSS also noted compensatory maneuvers did not increase airway protection). Recommend pt continue current Dysphagia 3 (mechanical soft) and nectar thick textures. She should perform extra dry swallows when drinking liquids, although during study she demonstrated ability to sense need for extra dry swallow to clear pharyngeal residue in 90% of opportunities. Please provide full supervision to ensure use of safe swallow strategies and optimal upright positioning during intake. ST will continue to provide skilled interventions to work toward further advancement while inpatient.     Swallow Evaluation Recommendations       SLP Diet Recommendations: Dysphagia 3 (Mech soft) solids;Nectar thick liquid   Liquid Administration via:  Cup   Medication Administration: Whole meds with puree   Supervision: Staff to assist with self feeding   Compensations: Slow rate;Small sips/bites;Multiple dry swallows after each bite/sip   Postural Changes: Remain semi-upright after after feeds/meals (Comment);Seated upright at 90 degrees   Oral Care Recommendations: Oral care BID   Other Recommendations: Order thickener from pharmacy;Prohibited food (jello, ice cream, thin soups);Remove water pitcher;Have oral suction available    [place] 08/29/2019,10:18 AM

## 2019-08-29 NOTE — Progress Notes (Signed)
[facility] [place] PHYSICAL MEDICINE & REHABILITATION PROGRESS NOTE   Subjective/Complaints:  States she had no breathing issues last noc, no sweats or chills Discussed use of IS as well as Flutter valve  ROS: No CP, SOB, N/V/D   Objective:   No results found. Recent Labs    08/26/19 1247  WBC 11.3*  HGB 13.6  HCT 43.7  PLT 291   Recent Labs    08/26/19 1247 08/28/19 0656  NA 140 138  K 3.9 4.1  CL 104 104  CO2 21* 23  GLUCOSE 116* 99  BUN 31* 31*  CREATININE 1.24* 1.06*  CALCIUM 8.9 8.6*    Intake/Output Summary (Last 24 hours) at 08/29/2019 0824 Last data filed at 08/29/2019 0815 Gross per 24 hour  Intake 1194.08 ml  Output 200 ml  Net 994.08 ml     Physical Exam: Vital Signs Blood pressure (!) 152/62, pulse 66, temperature 98.7 F (37.1 C), resp. rate 16, weight 56.4 kg, SpO2 98 %.  General: No acute distress Mood and affect are appropriate Heart: Regular rate and rhythm no rubs murmurs or extra sounds Lungs: Clear to auscultation, breathing unlabored, no rales or wheezes Abdomen: Positive bowel sounds, soft nontender to palpation, nondistended Extremities: No clubbing, cyanosis, or edema Skin: No evidence of breakdown, no evidence of rash Motor 4/5 Left side with absent proprioception and LT on left, 5/5 strength on RIght side   Assessment/Plan: 1. Functional deficits secondary to cerebellar hemorrhage which require 3+ hours per day of interdisciplinary therapy in a comprehensive inpatient rehab setting.  Physiatrist is providing close team supervision and 24 hour management of active medical problems listed below.  Physiatrist and rehab team continue to assess barriers to discharge/monitor patient progress toward functional and medical goals  Care Tool:  Bathing    Body parts bathed by patient: Left arm, Chest, Abdomen, Front perineal area, Right upper leg, Left upper leg, Face   Body parts bathed by helper: Right lower leg, Left lower leg, Right arm,  Buttocks     Bathing assist Assist Level: 2 Helpers     Upper Body Dressing/Undressing Upper body dressing   What is the patient wearing?: Pull over shirt    Upper body assist Assist Level: Maximal Assistance - Patient 25 - 49%    Lower Body Dressing/Undressing Lower body dressing      What is the patient wearing?: Pants, Incontinence brief     Lower body assist Assist for lower body dressing: Total Assistance - Patient < 25% (supine rolling side to side)     Toileting Toileting    Toileting assist Assist for toileting: 2 Helpers     Transfers Chair/bed transfer  Transfers assist  Chair/bed transfer activity did not occur: Safety/medical concerns  Chair/bed transfer assist level: 2 Helpers (max A of 1 and +2 min assist via squat pivot)     Locomotion Ambulation   Ambulation assist   Ambulation activity did not occur: Safety/medical concerns          Walk 10 feet activity   Assist  Walk 10 feet activity did not occur: Safety/medical concerns        Walk 50 feet activity   Assist Walk 50 feet with 2 turns activity did not occur: Safety/medical concerns         Walk 150 feet activity   Assist Walk 150 feet activity did not occur: Safety/medical concerns         Walk 10 feet on uneven surface  activity  Assist Walk 10 feet on uneven surfaces activity did not occur: Safety/medical concerns         Wheelchair     Assist Will patient use wheelchair at discharge?: No (No Long term goals set by PT)             Wheelchair 50 feet with 2 turns activity    Assist            Wheelchair 150 feet activity     Assist          Blood pressure (!) 152/62, pulse 66, temperature 98.7 F (37.1 C), resp. rate 16, weight 56.4 kg, SpO2 98 %.  Medical Problem List and Plan: 1.  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   Antithrombotics: -DVT/anticoagulation:  Mechanical: Sequential compression devices, below knee Bilateral lower extremities  Vascular ultrasound negative for DVT             -antiplatelet therapy: N/a due to bleed.  3. Pain Management: Back pain resolved with treatment of UTI. Discontinue hydrocodone and will use tramadol or tylenol prn for now.  4. Anxiety disorder/Mood:Neuropsych   limit sedating meds           -antipsychotic agents: N/a 5. Neuropsych: This patient is not fully capable of making decisions on her own behalf. 6. Skin/Wound Care:  Routine pressure relief measures.  7. Fluids/Electrolytes/Nutrition: Strict I/O.  8. NSTEMI with fluid overload: Treated medically with Coreg, Lisinopril and Lipitor.  9. Severe COPD: Encourage pulmonary hygiene--continue flutter. Wean oxygen to off as able as has not use it for past year per son. Albuterol MDI prn. 10. E coli UTI: Has completed 7 day antibiotic course on 07/11.  11. HTN: SBP goal <160. Monitor BP tid--on Norvasc and HCTZ.   BP continues to be much better controlled now off Labetalol. Vitals:   08/28/19 2204 08/29/19 0346  BP:  (!) 152/62  Pulse: 77 66  Resp:  16  Temp:  98.7 F (37.1 C)  SpO2: 100% 98%  Elevated diastolic x 1 will monitor for trend  12. Lethargy: Multifactorial?   -will decrease buspar to 2.5mg  per discussion with son 72. Urinary retention: Continue monitor voiding with PVR checks/bladder scans 4-6 hours and I/O caht to keep volumes <300 cc.   Flomax started  Pt now voiding, has urinary frequency , toileting program  14. A fib: Not AC candidate due to hemorrhage.  Rate controlled. 15. Dysphagia: On dysphagia 3 nectars.  Advance diet as tolerated 16. Tachypnea:   Xopenex BID   Improving- still with DOE Sat nl on RA, lung exam mainly with reduced BS at bases IS  10 puff q 1 hr 17.  Bowel incont with loose BM   LOS: 10 days A FACE TO FACE EVALUATION WAS PERFORMED  Erick Colace 08/29/2019, 8:24 AM

## 2019-08-29 NOTE — Progress Notes (Signed)
Physical Therapy Session Note  Patient Details  Name: Caitlin Vasquez MRN: [id number] Date of Birth: Jan 25, 1946  Today's Date: 08/29/2019 PT Individual Time: 4128-7867 PT Individual Time Calculation (min): 54 min   Short Term Goals: Week 2:  PT Short Term Goal 1 (Week 2): Pt will perform supine<>sit with mod assist PT Short Term Goal 2 (Week 2): Pt will perform bed<>chair transfers with max assist of 1 consistently PT Short Term Goal 3 (Week 2): Pt will perform sit<>stands with max assist of 1 consistently PT Short Term Goal 4 (Week 2): Pt will tolerate standing position using LRAD at least 30 seconds with max assist of 1  Skilled Therapeutic Interventions/Progress Updates:    Pt received supine in bed inquiring about when dinner was coming - oriented pt to time of day. Pt agreeable to therapy session. Received and maintained on 3L of O2 via nasal cannula throughout session. Supine>sitting R EOB with mod assist for trunk upright - continues to have anterior trunk lean/slouch once sitting EOB requiring max assist to maintain upright until able to have pt perform R lateral trunk lean onto forearm support to regain balance. Sitting EOB worked on midline orientation and decreased pusher tendencies in preparation for transfers OOB - visual cuing to line up trunk with therapist and manual facilitation for R UE positioning to decrease pushing. While sitting EOB reinforced importance of using incentive spirometry and had pt perform x5 breaths x3 reps with pt only able to raise bar to . Pt reports need to use bathroom. L stand pivot EOB>BSC with +2 max assist for lifting/pivoting hips and trunk control. Sit<>stand to/from [facility] with max assist of 1 and min assist of +2 while +2 assist also performed total assist LB clothing management. Pt unable to void continently on BSC. Sit>stand as above for +2 assist to pull up pants but pt unable to tolerate standing >10seconds therefore required 2 attempts to get  pants up over hips. R stand pivot BSC>EOB +2 mod assist for lifting/pivoting hips. L stand pivot EOB>w/c +2 mod assist for lifting/pivoting hips and trunk control as well as manual facilitation for L knee positioning. Pt noted to be having labored breathing with wheezing and reports symptoms of SOB - Kayla, RN notified and reports pt has a breathing treatment due soon. Pt then states she is still trying to pee being unaware of the fact that she had transferred off [facility] and into w/c - reoriented patient. Transported to/from gym in w/c for time management and energy conservation. Stand pivot transfers w/c<>EOM with +2 mod assist for lifting/pivoting hips and trunk control with cuing to decrease pushing - having pt perform stand pivots as opposed to squat pivots because it increases LE muscle activation to increase pt independence and decrease level of assist required to safely perform transfer. Sitting on mat focusing on activity tolerance, midline orientation (decreased pushing), and trunk control via reaching R towards external target. Swapped pt's foam cushion out for a Roho to provide improved pressure relief for increased upright OOB activity tolerance. Transported back to room. Pt requesting to return to bed and rest. R stand pivot +2 mod assist. Sit>supine mod assist for B LE management into bed and trunk descent. Pt reports she was incontinent of bladder - RN notified. Pt left supine in bed with needs in reach, bed alarm on, and HOB elevated.  Pam, PA notified of pt's labored breathing with wheezing during session today and yesterday.   Therapy Documentation Precautions:  Precautions Precautions: Fall, Other (  comment) Precaution Comments: supplemental O2, L side inattention, L visual field deficits, left side sensory deficits, left hemiparesis, pusher to the left Restrictions Weight Bearing Restrictions: No LLE Weight Bearing: Weight bearing as tolerated  Pain:   Reports generalized aching but  able to participate fully in session.   Therapy/Group: Individual Therapy  Ginny Forth , PT, DPT, CSRS  08/29/2019, 3:19 PM

## 2019-08-30 ENCOUNTER — Inpatient Hospital Stay (HOSPITAL_COMMUNITY): Payer: Medicare Other | Admitting: Speech Pathology

## 2019-08-30 ENCOUNTER — Inpatient Hospital Stay (HOSPITAL_COMMUNITY): Payer: Medicare Other

## 2019-08-30 ENCOUNTER — Inpatient Hospital Stay (HOSPITAL_COMMUNITY): Payer: Medicare Other | Admitting: Occupational Therapy

## 2019-08-30 LAB — GLUCOSE, CAPILLARY
Glucose-Capillary: 88 mg/dL (ref 70–99)
Glucose-Capillary: 111 mg/dL — ABNORMAL HIGH (ref 70–99)
Glucose-Capillary: 120 mg/dL — ABNORMAL HIGH (ref 70–99)
Glucose-Capillary: 141 mg/dL — ABNORMAL HIGH (ref 70–99)

## 2019-08-30 NOTE — Progress Notes (Signed)
[place] PHYSICAL MEDICINE & REHABILITATION PROGRESS NOTE   Subjective/Complaints:  No issues overnite   ROS: No CP, SOB, N/V/D   Objective:   DG Chest 2 View  Result Date: 08/29/2019 CLINICAL DATA:  Wheezing. EXAM: CHEST - 2 VIEW COMPARISON:  Chest x-ray dated August 19, 2019. FINDINGS: The heart size and mediastinal contours are within normal limits. Atherosclerotic calcification of the aortic arch. Normal pulmonary vascularity. Similar emphysematous changes with chronic interstitial coarsening. No focal consolidation, pleural effusion, or pneumothorax. No acute osseous abnormality. IMPRESSION: 1. No acute cardiopulmonary disease. 2. COPD. Electronically Signed   By: Obie Dredge M.D.   On: 08/29/2019 19:20   DG Swallowing Func-Speech Pathology  Result Date: 08/29/2019 Objective Swallowing Evaluation: Type of Study: MBS-Modified Barium Swallow Study  Patient Details Name: Caitlin Vasquez MRN: [id number] Date of Birth: [birth year] Today's Date: 08/29/2019 Past Medical History: Past Medical History: Diagnosis Date . COPD (chronic obstructive pulmonary disease) (HCC)  . High cholesterol  . On home oxygen therapy   "2L; all the time" (04/06/2016) . Pneumonia   "when I was a kid"  . Urinary hesitancy  Past Surgical History: Past Surgical History: Procedure Laterality Date . ABDOMINAL AORTOGRAM N/A 04/08/2016  Procedure: Abdominal Aortogram;  Surgeon: Sherren Kerns, MD;  Location: [facility] INVASIVE CV LAB;  Service: Cardiovascular;  Laterality: N/A; . CARDIAC CATHETERIZATION N/A 11/28/2014  Procedure: Right/Left Heart Cath and Coronary Angiography;  Surgeon: [name], MD;  Location: [facility] INVASIVE CV LAB;  Service: Cardiovascular;  Laterality: N/A; . CARDIAC CATHETERIZATION  08/2006  Hattie Perch 08/25/2006 . DRESSING CHANGE UNDER ANESTHESIA Left 04/12/2016  Procedure: DRESSING CHANGE LEFT HIP AND LEFT HEEL;  Surgeon: Fransisco Hertz, MD;  Location: [facility] OR;  Service: Vascular;  Laterality: Left; . FEMORAL-POPLITEAL  BYPASS GRAFT Left 04/12/2016  Procedure: LEFT FEMORAL-POPITEAL  BYPASS GRAFT;  Surgeon: Fransisco Hertz, MD;  Location: [facility] OR;  Service: Vascular;  Laterality: Left; . JOINT REPLACEMENT   . LOWER EXTREMITY ANGIOGRAPHY Bilateral 04/08/2016  Procedure: Lower Extremity Angiography;  Surgeon: Sherren Kerns, MD;  Location: [facility] INVASIVE CV LAB;  Service: Cardiovascular;  Laterality: Bilateral; . PERIPHERAL VASCULAR INTERVENTION Bilateral 04/08/2016  Procedure: Peripheral Vascular Intervention;  Surgeon: Sherren Kerns, MD;  Location: [facility] INVASIVE CV LAB;  Service: Cardiovascular;  Laterality: Bilateral;  common iliac . TOTAL HIP ARTHROPLASTY Left 01/29/2016 . VEIN HARVEST Left 04/12/2016  Procedure: LEFT GREATER SAPHENOUS VEIN HARVEST;  Surgeon: Fransisco Hertz, MD;  Location: [facility] OR;  Service: Vascular;  Laterality: Left; HPI: Pt is a 74 y.o. female with medical history significant of COPD, Afib; PVD s/p L fem-pop bypass and B CIA stenting; and HLD.  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 She was admitted to rehab on 7/6 with ongoing mild AMS, left inattention, LUE weakness, and tendency to "slump to the left."  She developed acute SOB on 7/7 with sats 75% on 3L [place] O2. CXR 7/7: Bilateral interstitial opacity, likely edema superimposed on the patient's emphysema. CT head: Interval evolution of large right PCA territory infarct involving the parasagittal temporal lobe, occipital lobe, and right basal ganglia. Pt admitted to [facility] 08/19/19.  Assessment / Plan / Recommendation CHL IP CLINICAL IMPRESSIONS 08/29/2019 Clinical Impression Pt's oropharyngeal swallow function is largely unchanged from previous MBSS on 08/12/19. She demonstrates reduced oral control of liquid boluses as well as silent aspiration. Thin barium from cup and straw was consistently penetrated to the level of the vocal  folds and not ejected, and aspirated without sensation X1 (PAS scale 5 and 8).  Although piecemeal swallowing and delayed swallow initiation (liquid pools in valleculae and pyriform sinuses at times) occurs with both thin and nectar, pt's airway protection was increased with nectar thick liquids as opposed to thin. Nectar barium did pass below the vocal folds X1 during the swallow, however all material was ejected from vestibule and trachea during the swallow. Pt became increasingly lethargic throughout study and was unable to follow commands for any compensatory strategies, or to self monitor bolus size (previous MBSS also noted compensatory maneuvers did not increase airway protection). Recommend pt continue current Dysphagia 3 (mechanical soft) and nectar thick textures. She should perform extra dry swallows when drinking liquids, although during study she demonstrated ability to sense need for extra dry swallow to clear pharyngeal residue in 90% of opportunities. Please provide full supervision to ensure use of safe swallow strategies and optimal upright positioning during intake. ST will continue to provide skilled interventions to work toward further advancement while inpatient. SLP Visit Diagnosis Dysphagia, oropharyngeal phase (R13.12) Attention and concentration deficit following -- Frontal lobe and executive function deficit following -- Impact on safety and function Moderate aspiration risk   CHL IP TREATMENT RECOMMENDATION 08/15/2019 Treatment Recommendations Therapy as outlined in treatment plan below   Prognosis 08/15/2019 Prognosis for Safe Diet Advancement Good Barriers to Reach Goals Cognitive deficits Barriers/Prognosis Comment -- CHL IP DIET RECOMMENDATION 08/29/2019 SLP Diet Recommendations Dysphagia 3 (Mech soft) solids;Nectar thick liquid Liquid Administration via Cup Medication Administration Whole meds with puree Compensations Slow rate;Small sips/bites;Multiple dry swallows after each bite/sip Postural Changes Remain semi-upright after after feeds/meals (Comment);Seated  upright at 90 degrees   CHL IP OTHER RECOMMENDATIONS 08/29/2019 Recommended Consults -- Oral Care Recommendations Oral care BID Other Recommendations Order thickener from pharmacy;Prohibited food (jello, ice cream, thin soups);Remove water pitcher;Have oral suction available   CHL IP FOLLOW UP RECOMMENDATIONS 08/19/2019 Follow up Recommendations Inpatient Rehab   CHL IP FREQUENCY AND DURATION 08/15/2019 Speech Therapy Frequency (ACUTE ONLY) min 2x/week Treatment Duration 2 weeks      CHL IP ORAL PHASE 08/29/2019 Oral Phase Impaired Oral - Pudding Teaspoon -- Oral - Pudding Cup -- Oral - Honey Teaspoon -- Oral - Honey Cup -- Oral - Nectar Teaspoon -- Oral - Nectar Cup Lingual/palatal residue;Piecemeal swallowing;Decreased bolus cohesion;Delayed oral transit Oral - Nectar Straw NT Oral - Thin Teaspoon -- Oral - Thin Cup Left anterior bolus loss;Piecemeal swallowing;Delayed oral transit;Premature spillage;Decreased bolus cohesion;Lingual/palatal residue Oral - Thin Straw Delayed oral transit;Premature spillage;Decreased bolus cohesion;Piecemeal swallowing Oral - Puree NT Oral - Mech Soft NT Oral - Regular -- Oral - Multi-Consistency -- Oral - Pill -- Oral Phase - Comment --  CHL IP PHARYNGEAL PHASE 08/29/2019 Pharyngeal Phase Impaired Pharyngeal- Pudding Teaspoon -- Pharyngeal -- Pharyngeal- Pudding Cup -- Pharyngeal -- Pharyngeal- Honey Teaspoon -- Pharyngeal -- Pharyngeal- Honey Cup -- Pharyngeal -- Pharyngeal- Nectar Teaspoon -- Pharyngeal -- Pharyngeal- Nectar Cup Delayed swallow initiation-vallecula;Delayed swallow initiation-pyriform sinuses;Penetration/Aspiration during swallow Pharyngeal Material enters airway, passes BELOW cords then ejected out Pharyngeal- Nectar Straw NT Pharyngeal -- Pharyngeal- Thin Teaspoon -- Pharyngeal -- Pharyngeal- Thin Cup Delayed swallow initiation-vallecula;Delayed swallow initiation-pyriform sinuses;Reduced airway/laryngeal closure;Penetration/Aspiration during swallow Pharyngeal  Material enters airway, CONTACTS cords and not ejected out;Material enters airway, passes BELOW cords without attempt by patient to eject out (silent aspiration) Pharyngeal- Thin Straw Delayed swallow initiation-vallecula;Delayed swallow initiation-pyriform sinuses;Reduced airway/laryngeal closure;Penetration/Aspiration during swallow Pharyngeal Material enters airway, CONTACTS cords and not ejected out  Pharyngeal- Puree NT Pharyngeal -- Pharyngeal- Mechanical Soft NT Pharyngeal -- Pharyngeal- Regular -- Pharyngeal -- Pharyngeal- Multi-consistency -- Pharyngeal -- Pharyngeal- Pill -- Pharyngeal -- Pharyngeal Comment --  CHL IP CERVICAL ESOPHAGEAL PHASE 08/29/2019 Cervical Esophageal Phase WFL Pudding Teaspoon -- Pudding Cup -- Honey Teaspoon -- Honey Cup -- Nectar Teaspoon -- Nectar Cup -- Nectar Straw -- Thin Teaspoon -- Thin Cup -- Thin Straw -- Puree -- Mechanical Soft -- Regular -- Multi-consistency -- Pill -- Cervical Esophageal Comment -- Little Ishikawa 08/29/2019, 10:19 AM              No results for input(s): WBC, HGB, HCT, PLT in the last 72 hours. Recent Labs    08/28/19 0656  NA 138  K 4.1  CL 104  CO2 23  GLUCOSE 99  BUN 31*  CREATININE 1.06*  CALCIUM 8.6*    Intake/Output Summary (Last 24 hours) at 08/30/2019 0814 Last data filed at 08/30/2019 0600 Gross per 24 hour  Intake 528 ml  Output 850 ml  Net -322 ml     Physical Exam: Vital Signs Blood pressure (!) 138/83, pulse 77, temperature 98.3 F (36.8 C), resp. rate 18, weight 55.6 kg, SpO2 98 %.   General: No acute distress Mood and affect are appropriate Heart: Regular rate and rhythm no rubs murmurs or extra sounds Lungs: Clear to auscultation, breathing unlabored, no rales or wheezes Abdomen: Positive bowel sounds, soft nontender to palpation, nondistended Extremities: No clubbing, cyanosis, or edema Skin: No evidence of breakdown, no evidence of rash  Motor 4/5 Left side with absent proprioception and LT on left, 5/5  strength on RIght side   Assessment/Plan: 1. Functional deficits secondary to cerebellar hemorrhage which require 3+ hours per day of interdisciplinary therapy in a comprehensive inpatient rehab setting.  Physiatrist is providing close team supervision and 24 hour management of active medical problems listed below.  Physiatrist and rehab team continue to assess barriers to discharge/monitor patient progress toward functional and medical goals  Care Tool:  Bathing    Body parts bathed by patient: Left arm, Chest, Abdomen, Front perineal area, Right upper leg, Left upper leg, Face   Body parts bathed by helper: Right lower leg, Left lower leg, Right arm, Buttocks     Bathing assist Assist Level: 2 Helpers     Upper Body Dressing/Undressing Upper body dressing   What is the patient wearing?: Pull over shirt    Upper body assist Assist Level: Maximal Assistance - Patient 25 - 49%    Lower Body Dressing/Undressing Lower body dressing      What is the patient wearing?: Pants, Incontinence brief     Lower body assist Assist for lower body dressing: Total Assistance - Patient < 25% (supine rolling side to side)     Toileting Toileting    Toileting assist Assist for toileting: 2 Helpers     Transfers Chair/bed transfer  Transfers assist  Chair/bed transfer activity did not occur: Safety/medical concerns  Chair/bed transfer assist level: 2 Helpers (+2 mod assist stand pivot)     Locomotion Ambulation   Ambulation assist   Ambulation activity did not occur: Safety/medical concerns          Walk 10 feet activity   Assist  Walk 10 feet activity did not occur: Safety/medical concerns        Walk 50 feet activity   Assist Walk 50 feet with 2 turns activity did not occur: Safety/medical concerns  Walk 150 feet activity   Assist Walk 150 feet activity did not occur: Safety/medical concerns         Walk 10 feet on uneven surface   activity   Assist Walk 10 feet on uneven surfaces activity did not occur: Safety/medical concerns         Wheelchair     Assist Will patient use wheelchair at discharge?: No (No Long term goals set by PT)             Wheelchair 50 feet with 2 turns activity    Assist            Wheelchair 150 feet activity     Assist          Blood pressure (!) 138/83, pulse 77, temperature 98.3 F (36.8 C), resp. rate 18, weight 55.6 kg, SpO2 98 %.  Medical Problem List and Plan: 1.  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  Antithrombotics: -DVT/anticoagulation:  Mechanical: Sequential compression devices, below knee Bilateral lower extremities  Vascular ultrasound negative for DVT             -antiplatelet therapy: N/a due to bleed.  3. Pain Management: Back pain resolved with treatment of UTI. Discontinue hydrocodone and will use tramadol or tylenol prn for now.  4. Anxiety disorder/Mood:Neuropsych   limit sedating meds           -antipsychotic agents: N/a 5. Neuropsych: This patient is not fully capable of making decisions on her own behalf. 6. Skin/Wound Care:  Routine pressure relief measures.  7. Fluids/Electrolytes/Nutrition: Strict I/O. On IVF but intake improving will hold ovfer the weekend and recheck BMET on Monday  8. NSTEMI with fluid overload: Treated medically with Coreg, Lisinopril and Lipitor.  9. Severe COPD: Encourage pulmonary hygiene--continue flutter. Wean oxygen to off as able as has not use it for past year per son. Albuterol MDI prn. 10. E coli UTI: Has completed 7 day antibiotic course on 07/11.  11. HTN: SBP goal <160. Monitor BP tid--on Norvasc and HCTZ.   BP continues to be much better controlled now off Labetalol. Vitals:   08/29/19 2123 08/30/19 0323  BP:  (!) 138/83  Pulse:  77  Resp:  18  Temp:   98.3 F (36.8 C)  SpO2: 94% 98%  Elevated diastolic x 1 will monitor for trend  12. Lethargy: Multifactorial?   -will decrease buspar to 2.5mg  per discussion with son 95. Urinary retention: Continue monitor voiding with PVR checks/bladder scans 4-6 hours and I/O caht to keep volumes <300 cc.   Flomax started  Pt now voiding, has urinary frequency , toileting program , occ caths 14. A fib: Not AC candidate due to hemorrhage.  Rate controlled. 15. Dysphagia: On dysphagia 3 nectars.  Advance diet as tolerated 16. Tachypnea:   Xopenex BID   Improving- still with DOE Sat nl on RA, lung exam mainly with reduced BS at bases IS  10 puff q 1 hr- pt need reminders 17.  Bowel incont constipated LBM yesterday hard stool  LOS: 11 days A FACE TO FACE EVALUATION WAS PERFORMED  Erick Colace 08/30/2019, 8:14 AM

## 2019-08-30 NOTE — Progress Notes (Signed)
Pt requesting a breathing treatment because she is wheezing. Albuterol treatment given. Pt tolerated well.

## 2019-08-30 NOTE — Progress Notes (Signed)
Occupational Therapy Session Note  Patient Details  Name: Caitlin Vasquez MRN: [id number] Date of Birth: 1946/01/28  Today's Date: 08/30/2019 OT Individual Time: [phone] OT Individual Time Calculation (min): 55 min   Skilled Therapeutic Interventions/Progress Updates:    Pt greeted in bed with no c/o pain, lethargic and satting at 96% on 1.5L. She wanted to start the session by eating her breakfast. Mod A for supine<sit where alertness noticeably improved. She needed Max A for sitting balance due to Lt pushing tendencies, pt also leaning forward and needing assist with correction. 02 sats decreased to 83% at this time, pt requesting several times to return to bed to eat due to fatigue. She returned to bed with +2 assist and was also boosted up. Pt was then placed in chair position and OT placed ~90% of her meal items towards Lt of midline to promote scanning. She needed vcs to scan to find items on Lt and Rt sides, often asked therapist where items were. HOH to incorporate the Lt hand to stabilize oatmeal bowl and when bilaterally incorporating limb to manage beverages. Note that pt has hypertonicity in the hand and exhibits poor gross motor control, also some spasm activity. 02 sats 91-93% during meal, vcs for slowing rate of consumption throughout. She did well with oral clearance and exhibited no Lt sided pocketing when assessed. Oral care was set up once again with most items placed on the Lt side to promote scanning. HOH to include the Lt hand during task, vcs for sequencing. She rinsed using nectar thickened water per dietary precautions. When she completed handwashing using sanitizer, pt was cued to wash her Lt hand and she brought her affected arm to her head and accidentally bumped her forehead. When RN arrived to provide medicine, pt stated her brief was clean. When assessed, pt was incontinent of bowels. She was left in care of RN for brief change.   Therapy Documentation Precautions:   Precautions Precautions: Fall, Other (comment) Precaution Comments: supplemental O2, L side inattention, L visual field deficits, left side sensory deficits, left hemiparesis, pusher to the left Restrictions Weight Bearing Restrictions: No LLE Weight Bearing: Non weight bearing Vital Signs: Oxygen Therapy SpO2: 94 % O2 Device: Nasal Cannula O2 Flow Rate (L/min): 2 L/min ADL: ADL Eating: Minimal assistance Where Assessed-Eating: Bed level Grooming: Minimal assistance Where Assessed-Grooming: Bed level Upper Body Bathing: Maximal assistance Where Assessed-Upper Body Bathing: Edge of bed Lower Body Bathing: Dependent Where Assessed-Lower Body Bathing: Edge of bed, Bed level Upper Body Dressing: Dependent Where Assessed-Upper Body Dressing: Edge of bed Lower Body Dressing: Dependent Where Assessed-Lower Body Dressing: Edge of bed Toileting: Dependent Where Assessed-Toileting: Bedside Commode Toilet Transfer: Dependent Toilet Transfer Method: Animal nutritionist Equipment: Bedside commode     Therapy/Group: Individual Therapy  Nature Vogelsang A Whitney Bingaman 08/30/2019, 12:26 PM

## 2019-08-30 NOTE — Progress Notes (Signed)
Speech Language Pathology Daily Session Note  Patient Details  Name: Caitlin Vasquez MRN: [id number] Date of Birth: Jun 29, 1945  Today's Date: 08/30/2019 SLP Individual Time: [id number] SLP Individual Time Calculation (min): 42 min  Short Term Goals: Week 2: SLP Short Term Goal 1 (Week 2): Pt will scan left of midline during functional tasks with mod A verbal and visual cues. SLP Short Term Goal 2 (Week 2): Pt will demonstrate sustained attention in 10 minute intervals during functional tasks with min A verbal cues for redirection, SLP Short Term Goal 3 (Week 2): Pt will idenitfy 1 swallow or cognitive and 1 phsyical deficits from acute injury with max A verbal cues. SLP Short Term Goal 4 (Week 2): Pt will use compensatory aids or strategies to recall daily information with Mod A cues. SLP Short Term Goal 5 (Week 2): Pt will demonstrate ability to problem solving functional basic situations with Mod A multimodal cues. SLP Short Term Goal 6 (Week 2): Pt will consume trials of thin liquids with minmal overt s/sx aspiration X3 prior to repeat MBSS.  Skilled Therapeutic Interventions: Pt was seen for skilled ST targeting dysphagia and cognition. SLP attempted to teach pt effortful swallow exercise for pharyngeal strengthening. Pt demonstrated ability to perform it X3 with Max A demonstrational cues, but due to increased WOB and difficulty sustaining attention, pt unable to continue exercises today. She did express desire to eat yogurt and drink nectar thick juice, during which she required Min A verbal cues for smaller bite/sip size and rest breaks to coordinate breathing and swallowing. One throat clear was noted after a sip of nectar `.  During a basic 3-step action card sequencing task, pt required Mod A verbal and visual cues for problem solving and error awareness. SLP also provided Mod faded to Min A cues for sustained attention to functional tasks throughout session. Of note, pt required cues for  orientation to place, and heavier cueing for accurate use of calendar to orient to time today in comparison to what commonly provided from SLP to this pt. Pt left laying in bed with alarm set and needs within reach. Continue per current plan of care.          Pain Pain Assessment Pain Scale: 0-10 Pain Score: 0-No pain  Therapy/Group: Individual Therapy  Little Ishikawa 08/30/2019, 7:30 AM

## 2019-08-30 NOTE — Progress Notes (Signed)
Attempted to toilet patient to [facility] as ordered, but patient did not want to get up, This RN concerned about heavy lean with use of BSC. Will discuss with therapy tomorrow to see about alternative toileting plan. Patient attempted void on bedpan but not successful.

## 2019-08-30 NOTE — Progress Notes (Signed)
Physical Therapy Session Note  Patient Details  Name: Caitlin Vasquez MRN: [id number] Date of Birth: [birth year]  Today's Date: 08/30/2019 PT Individual Time: 1030-1115 PT Individual Time Calculation (min): 45 min   Short Term Goals: Week 1:  PT Short Term Goal 1 (Week 1): Pt will perform supine<>sit with mod assist PT Short Term Goal 1 - Progress (Week 1): Progressing toward goal PT Short Term Goal 2 (Week 1): Pt will be able to maintain midline orientation in sitting for 32mnute with no more than min assist of 1 PT Short Term Goal 2 - Progress (Week 1): Met PT Short Term Goal 3 (Week 1): Pt will perform sit<>stand with +2 mod assist PT Short Term Goal 3 - Progress (Week 1): Met PT Short Term Goal 4 (Week 1): Pt will perform bed<>chair transfers with +2 max assist PT Short Term Goal 4 - Progress (Week 1): Met Week 2:  PT Short Term Goal 1 (Week 2): Pt will perform supine<>sit with mod assist PT Short Term Goal 2 (Week 2): Pt will perform bed<>chair transfers with max assist of 1 consistently PT Short Term Goal 3 (Week 2): Pt will perform sit<>stands with max assist of 1 consistently PT Short Term Goal 4 (Week 2): Pt will tolerate standing position using LRAD at least 30 seconds with max assist of 1 Week 3:     Skilled Therapeutic Interventions/Progress Updates:    PAIN denies pain this am  Pt initially supine and pleasantly confused.  Lifts RLE, max assist LLE to thread pants.  Rolls L and R w/mod assist for therapist to raise pants. Supine to side to sit w/mod assist, overshoots to L/pushes w/R.   Therapists max assist for removal of gown, pt max assist for donning clean shirt, cues for wtshifting/midline orientation/pushes w/R. Worked on static sitting/midline orientation using multimodal cues, reahing to r w/RUE.  Pt 02sats 92-99%. SPT max assist to wc, repositioned w/mod assist and use of tilt feature of wc. Pt transported to gym for continued session  STS in parallel bars w/max  assist due to pushing w/RUE/poor LLE/trunk control.  Pt w/very poor attention to task and stands momentarilty before returning to sitting. In sitting worked on reaching/wt shifting activity using pegs/peg board to far R. Attempted sts and pegboard activity in standing but pt again w/poor attention to task.   02 sats 90-99% on 2L02, audible wheezing.  Pt transported back to room and set up to use incentive spriometer/valve, cues for technique. Pt left oob in wc placed in tilt for safety w/alarm belt set and needs in reach  Pt continues to push strongly w/RUE, limited attention.  Therapy Documentation Precautions:  Precautions Precautions: Fall, Other (comment) Precaution Comments: supplemental O2, L side inattention, L visual field deficits, left side sensory deficits, left hemiparesis, pusher to the left Restrictions Weight Bearing Restrictions: No LLE Weight Bearing: Non weight bearing    Therapy/Group: Individual Therapy  BCallie Fielding PCulloden7/23/2021, 12:36 PM

## 2019-08-31 ENCOUNTER — Inpatient Hospital Stay (HOSPITAL_COMMUNITY): Payer: Medicare Other

## 2019-08-31 ENCOUNTER — Inpatient Hospital Stay (HOSPITAL_COMMUNITY): Payer: Medicare Other | Admitting: Physical Therapy

## 2019-08-31 ENCOUNTER — Inpatient Hospital Stay (HOSPITAL_COMMUNITY): Payer: Medicare Other | Admitting: Occupational Therapy

## 2019-08-31 ENCOUNTER — Inpatient Hospital Stay (HOSPITAL_COMMUNITY): Payer: Medicare Other | Admitting: Speech Pathology

## 2019-08-31 DIAGNOSIS — R159 Full incontinence of feces: Secondary | ICD-10-CM

## 2019-08-31 DIAGNOSIS — R0600 Dyspnea, unspecified: Secondary | ICD-10-CM

## 2019-08-31 DIAGNOSIS — R799 Abnormal finding of blood chemistry, unspecified: Secondary | ICD-10-CM

## 2019-08-31 DIAGNOSIS — J449 Chronic obstructive pulmonary disease, unspecified: Secondary | ICD-10-CM

## 2019-08-31 DIAGNOSIS — R32 Unspecified urinary incontinence: Secondary | ICD-10-CM

## 2019-08-31 LAB — GLUCOSE, CAPILLARY
Glucose-Capillary: 97 mg/dL (ref 70–99)
Glucose-Capillary: 98 mg/dL (ref 70–99)
Glucose-Capillary: 103 mg/dL — ABNORMAL HIGH (ref 70–99)
Glucose-Capillary: 190 mg/dL — ABNORMAL HIGH (ref 70–99)

## 2019-08-31 LAB — CBC
HCT: 37.6 % (ref 36.0–46.0)
Hemoglobin: 12 g/dL (ref 12.0–15.0)
MCH: 28.6 pg (ref 26.0–34.0)
MCHC: 31.9 g/dL (ref 30.0–36.0)
MCV: 89.7 fL (ref 80.0–100.0)
Platelets: 269 10*3/uL (ref 150–400)
RBC: 4.19 MIL/uL (ref 3.87–5.11)
RDW: 13.4 % (ref 11.5–15.5)
WBC: 11.2 10*3/uL — ABNORMAL HIGH (ref 4.0–10.5)
nRBC: 0 % (ref 0.0–0.2)

## 2019-08-31 NOTE — Progress Notes (Addendum)
Occupational Therapy Session Note  Patient Details  Name: Caitlin Vasquez MRN: [id number] Date of Birth: 03/12/1945  Today's Date: 08/31/2019 OT Individual Time: 0832-0947 OT Individual Time Calculation (min): 75 min   Short Term Goals: Week 2:  OT Short Term Goal 1 (Week 2): Pt will maintain static sitting balance for 3 mins EOB with min assist in preparation for selfcare tasks. OT Short Term Goal 2 (Week 2): Pt will complete UB bathing in supported sitting with no more than min assist for two consecutive sessions. OT Short Term Goal 3 (Week 2): Pt will locate grooming and bathing items placed left of midline with no more than mod instructional cueing. OT Short Term Goal 4 (Week 2): Pt will donn a pullover shirt with mod assist following hemi dressing techniques.  Skilled Therapeutic Interventions/Progress Updates:    Pt greeted upright in bed with son present finishing breakfast. Pt wheezing a lot this morning so Respiratory entered room to administer breathing treatment. Pt's son also picked out pt's clothing for the day prior to exiting room. After breathing treatment, pt noted to have large loose watery stool leaking out of top and bottom of brief. Pt unaware of large stool incontinence. Mod A rolling L and R for total A peri-care and brief change.  Discussed with nursing appropriateness of toileting on BSC every 2 hours. Pt does not have the endurance to get to BSC every 2 hours and will need to use bed pan mostly. Total A to thread pant legs in bed, then tried to work on hip bridging to pull up pants, but pt was unable to clear bottom high enough, requiring rolling to get pants over hips. Max A +2 bed mobility to come to sitting EOB. Pt with strong lateral lean in sitting requiring max A to maintain sitting balance. Max+2 for squat-pivot over to TIS wc. Pt brought to the sink in wc and worked on UB bathing/dressing with max A overall. Pt often pushing herself over to the L in wc. OT washed pt's  hair in the sink using wash basin with pt holding onto handle. OT then had pt work on maintaining midline using mirror feedback while OT assisted with drying pt's hair with hairdryer. Pt left seated in TIS wc at end of session with alarm belt on, L UE positioned on pillow, call bell in reach, and needs met.   Therapy Documentation Precautions:  Precautions Precautions: Fall, Other (comment) Precaution Comments: supplemental O2, L side inattention, L visual field deficits, left side sensory deficits, left hemiparesis, pusher to the left Restrictions Weight Bearing Restrictions: No LLE Weight Bearing: Non weight bearing \Vital Signs: Oxygen Therapy SpO2: 97 % O2 Device: Nasal Cannula O2 Flow Rate (L/min): 2 L/min Pain: Pain Assessment Pain Scale: 0-10 Pain Score: 0-No pain   Therapy/Group: Individual Therapy  Elisabeth S Doe 08/31/2019, 9:55 AM 

## 2019-08-31 NOTE — Progress Notes (Signed)
Patient got choked up during dinner while drinking nectar thick juice. Mouth suctioned. Complaining of acid reflux and nausa, PRN meds given. Encouraged to rest and then use IS and flutter valve to help with breathing.

## 2019-08-31 NOTE — Progress Notes (Addendum)
[place] PHYSICAL MEDICINE & REHABILITATION PROGRESS NOTE   Subjective/Complaints: Patient seen laying in bed this morning.  She indicates she slept well overnight.  She is more alert and interactive than previous encounter.  Later called by nursing regarding increased work of breathing.  ROS: Denies CP, SOB, N/V/D  Objective:   DG Chest 2 View  Result Date: 08/29/2019 CLINICAL DATA:  Wheezing. EXAM: CHEST - 2 VIEW COMPARISON:  Chest x-ray dated August 19, 2019. FINDINGS: The heart size and mediastinal contours are within normal limits. Atherosclerotic calcification of the aortic arch. Normal pulmonary vascularity. Similar emphysematous changes with chronic interstitial coarsening. No focal consolidation, pleural effusion, or pneumothorax. No acute osseous abnormality. IMPRESSION: 1. No acute cardiopulmonary disease. 2. COPD. Electronically Signed   By: Obie Dredge M.D.   On: 08/29/2019 19:20   No results for input(s): WBC, HGB, HCT, PLT in the last 72 hours. No results for input(s): NA, K, CL, CO2, GLUCOSE, BUN, CREATININE, CALCIUM in the last 72 hours.  Intake/Output Summary (Last 24 hours) at 08/31/2019 1611 Last data filed at 08/31/2019 1237 Gross per 24 hour  Intake 1196 ml  Output 94 ml  Net 1102 ml     Physical Exam: Vital Signs Blood pressure (!) 124/45, pulse 76, temperature 97.6 F (36.4 C), temperature source Oral, resp. rate (!) 24, weight 56.2 kg, SpO2 97 %. Constitutional: No distress . Vital signs reviewed. HENT: Normocephalic.  Atraumatic. Eyes: EOMI. No discharge. Cardiovascular: No JVD.  Marland Kitchen Respiratory: Normal effort.  No stridor.?  Mild [place].  + [place]. GI: Non-distended.  BS +. Skin: Warm and dry.  Intact. Psych: Normal mood.  Normal behavior. Musc: No edema in extremities.  No tenderness in extremities. Neuro: Alert Motor: LUE: 4 -/5 proximal distal with apraxia LLE: 4 -/5 proximal distal  Assessment/Plan: 1. Functional deficits secondary to cerebellar  hemorrhage which require 3+ hours per day of interdisciplinary therapy in a comprehensive inpatient rehab setting.  Physiatrist is providing close team supervision and 24 hour management of active medical problems listed below.  Physiatrist and rehab team continue to assess barriers to discharge/monitor patient progress toward functional and medical goals  Care Tool:  Bathing    Body parts bathed by patient: Left arm, Chest, Abdomen, Front perineal area, Right upper leg, Left upper leg, Face   Body parts bathed by helper: Right lower leg, Left lower leg, Right arm, Buttocks     Bathing assist Assist Level: 2 Helpers     Upper Body Dressing/Undressing Upper body dressing   What is the patient wearing?: Pull over shirt    Upper body assist Assist Level: Maximal Assistance - Patient 25 - 49%    Lower Body Dressing/Undressing Lower body dressing      What is the patient wearing?: Pants, Incontinence brief     Lower body assist Assist for lower body dressing: Total Assistance - Patient < 25% (supine rolling side to side)     Toileting Toileting    Toileting assist Assist for toileting: 2 Helpers     Transfers Chair/bed transfer  Transfers assist  Chair/bed transfer activity did not occur: Safety/medical concerns  Chair/bed transfer assist level: Maximal Assistance - Patient 25 - 49%     Locomotion Ambulation   Ambulation assist   Ambulation activity did not occur: Safety/medical concerns          Walk 10 feet activity   Assist  Walk 10 feet activity did not occur: Safety/medical concerns  Walk 50 feet activity   Assist Walk 50 feet with 2 turns activity did not occur: Safety/medical concerns         Walk 150 feet activity   Assist Walk 150 feet activity did not occur: Safety/medical concerns         Walk 10 feet on uneven surface  activity   Assist Walk 10 feet on uneven surfaces activity did not occur: Safety/medical  concerns         Wheelchair     Assist Will patient use wheelchair at discharge?: No (No Long term goals set by PT)             Wheelchair 50 feet with 2 turns activity    Assist            Wheelchair 150 feet activity     Assist          Blood pressure (!) 124/45, pulse 76, temperature 97.6 F (36.4 C), temperature source Oral, resp. rate (!) 24, weight 56.2 kg, SpO2 97 %.  Medical Problem List and Plan: 1.  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  Antithrombotics: -DVT/anticoagulation:  Mechanical: Sequential compression devices, below knee Bilateral lower extremities  Vascular ultrasound negative for DVT             -antiplatelet therapy: N/a due to bleed.  3. Pain Management: Back pain resolved with treatment of UTI. Discontinued hydrocodone and will use tramadol or tylenol prn for now.   Appears controlled on 7/24 4. Anxiety disorder/Mood:Neuropsych     limit sedating meds  -antipsychotic agents: N/a 5. Neuropsych: This patient is not fully capable of making decisions on her own behalf. 6. Skin/Wound Care:  Routine pressure relief measures.  7. Fluids/Electrolytes/Nutrition: Strict I/Os.   8. NSTEMI with fluid overload: Treated medically with Coreg, Lisinopril and Lipitor.  9. Severe COPD: Encourage pulmonary hygiene--continue flutter. Wean oxygen to off as able as has not use it for past year per son. Albuterol MDI prn.  Discussed with nursing to maintain oxygen sats in high 80s/low 80s, avoid 100% 10. E coli UTI: Has completed 7 day antibiotic course on 07/11.  11. HTN: SBP goal <160. Monitor BP tid--on Norvasc and HCTZ.  Vitals:   08/31/19 1344 08/31/19 1527  BP: (!) 124/45   Pulse: 76   Resp: (!) 24   Temp: 97.6 F (36.4 C)   SpO2: (!) 85% 97%   Slightly labile on 7/24, monitor for trend 12. Lethargy: Multifactorial?    Decreased buspar to 2.5mg  after discussion with son 46. Urinary retention with incontinence: Continue monitor voiding with PVR checks/bladder scans 4-6 hours and I/O caht to keep volumes <300 cc.   Flomax started  Some improvement 14. A fib: Not AC candidate due to hemorrhage.  Rate controlled. 15.  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  Advance diet as tolerated 16. Tachypnea:   Xopenex BID   See #9  Increased work of breathing with changes in lung exam  WBC 11.3 on 7/19  Chest x-ray ordered  CBC ordered  IS  10 puff q 1 hr- pt need reminders 17.  Bowel incont  Remains incontinent on 7/24 18.  Elevated BUN/creatinine  Holding IVF, labs ordered for Monday  LOS: 12 days A FACE TO FACE EVALUATION WAS PERFORMED  Aniko Finnigan Karis Juba 08/31/2019, 4:11 PM

## 2019-08-31 NOTE — Progress Notes (Signed)
Physical Therapy Session Note  Patient Details  Name: Caitlin Vasquez MRN: [id number] Date of Birth: 1945/08/21  Today's Date: 08/31/2019 PT Individual Time: 1330-1415 PT Individual Time Calculation (min): 45 min   Short Term Goals: Week 2:  PT Short Term Goal 1 (Week 2): Pt will perform supine<>sit with mod assist PT Short Term Goal 2 (Week 2): Pt will perform bed<>chair transfers with max assist of 1 consistently PT Short Term Goal 3 (Week 2): Pt will perform sit<>stands with max assist of 1 consistently PT Short Term Goal 4 (Week 2): Pt will tolerate standing position using LRAD at least 30 seconds with max assist of 1  Skilled Therapeutic Interventions/Progress Updates:    Pt received seated in bed, agreeable to therapy session. Pt reports feeling short of air and that it is hard to breathe this PM. Pt on 3L O2 and SpO2 remains at 99% during therapy session. Pt received breathing treatment prior to therapy session. Pt requesting to use the bedpan. Rolling L/R with mod A for placement of bedpan. Pt unable to void after several minutes on bedpan. Pt agreeable to get up to chair. Supine to sit with mod A for trunk control and LLE management. Pt requires mod to max A for sitting balance EOB with significant L lateral and anterior lean. Sit to stand with max A. Stand pivot transfer to w/c with max A with heavy pushing to the L during transfer. Pt agreeable to stay seated up in chair. Pt left reclined in TIS w/c in room with needs in reach, quick release belt and chair alarm in place at end of session.  Therapy Documentation Precautions:  Precautions Precautions: Fall, Other (comment) Precaution Comments: supplemental O2, L side inattention, L visual field deficits, left side sensory deficits, left hemiparesis, pusher to the left Restrictions Weight Bearing Restrictions: No LLE Weight Bearing: Non weight bearing    Therapy/Group: Individual Therapy   Peter Congo, PT, DPT  08/31/2019,  2:24 PM

## 2019-08-31 NOTE — Progress Notes (Signed)
Speech Language Pathology Daily Session Note  Patient Details  Name: Caitlin Vasquez MRN: [id number] Date of Birth: [birth year]  Today's Date: 08/31/2019 SLP Individual Time: 1131-1200 SLP Individual Time Calculation (min): 29 min  Short Term Goals: Week 2: SLP Short Term Goal 1 (Week 2): Pt will scan left of midline during functional tasks with mod A verbal and visual cues. SLP Short Term Goal 2 (Week 2): Pt will demonstrate sustained attention in 10 minute intervals during functional tasks with min A verbal cues for redirection, SLP Short Term Goal 3 (Week 2): Pt will idenitfy 1 swallow or cognitive and 1 phsyical deficits from acute injury with max A verbal cues. SLP Short Term Goal 4 (Week 2): Pt will use compensatory aids or strategies to recall daily information with Mod A cues. SLP Short Term Goal 5 (Week 2): Pt will demonstrate ability to problem solving functional basic situations with Mod A multimodal cues. SLP Short Term Goal 6 (Week 2): Pt will consume trials of thin liquids with minmal overt s/sx aspiration X3 prior to repeat MBSS.  Skilled Therapeutic Interventions: Pt was seen for skilled ST targeting cognitive goals. Pt with improvements in orientation today in comparison to yesterday; independently oriented to place and situation, Min A verbal and visual cues provided for accurate use of calendar as aid for accurate orientation to date. Pt also verbally recalled activities from OT session earlier this morning, with only Min A question prompts - no external aid needed. Pt required Mod A verbal cues for redirection to tasks throughout session. She matched colored blocks to a basic 9-color pattern board with overall Min A verbal and visual cues for problem solving and locating colors and blocks left of midline, Mod A for awareness of errors. Pt left sitting in chair with alarm set and needs within reach. Continue per current plan of care.          Pain Pain Assessment Pain Scale:  0-10 Pain Score: 0-No pain  Therapy/Group: Individual Therapy  Little Ishikawa 08/31/2019, 7:25 AM

## 2019-08-31 NOTE — Progress Notes (Signed)
RT reported increased WOB and crackles/ronchi between noon assessment and 1500, gave breathing treatment. Patient states increased difficulty in breathing. MD contacted and orders given.Continuing to monitor.

## 2019-08-31 NOTE — Progress Notes (Signed)
Discussed toileting plan with OT, NT, and Patient. Set plan to toilet on bedpan Q2-3 hours to conserve energy and in hopes of regaining continence, then transfer to [facility] as patient gains mobility. Pushing fluids.

## 2019-09-01 ENCOUNTER — Inpatient Hospital Stay (HOSPITAL_COMMUNITY): Payer: Medicare Other

## 2019-09-01 ENCOUNTER — Inpatient Hospital Stay (HOSPITAL_COMMUNITY): Payer: Medicare Other | Admitting: Occupational Therapy

## 2019-09-01 ENCOUNTER — Inpatient Hospital Stay (HOSPITAL_COMMUNITY): Payer: Medicare Other | Admitting: Speech Pathology

## 2019-09-01 DIAGNOSIS — R7309 Other abnormal glucose: Secondary | ICD-10-CM | POA: Insufficient documentation

## 2019-09-01 LAB — GLUCOSE, CAPILLARY
Glucose-Capillary: 74 mg/dL (ref 70–99)
Glucose-Capillary: 91 mg/dL (ref 70–99)
Glucose-Capillary: 117 mg/dL — ABNORMAL HIGH (ref 70–99)
Glucose-Capillary: 103 mg/dL — ABNORMAL HIGH (ref 70–99)

## 2019-09-01 MED ORDER — ALBUTEROL SULFATE (2.5 MG/3ML) 0.083% IN NEBU
2.5000 mg | INHALATION_SOLUTION | RESPIRATORY_TRACT | Status: DC | PRN
  Administered 2019-09-01 – 2019-09-08 (×10): 2.5 mg via RESPIRATORY_TRACT
  Filled 2019-09-01 (×11): qty 3

## 2019-09-01 NOTE — Progress Notes (Addendum)
Occupational Therapy Session Note  Patient Details  Name: Caitlin Vasquez MRN: [id number] Date of Birth: 10-04-1945  Today's Date: 09/01/2019 OT Individual Time: 0800-0830 OT Individual Time Calculation (min): 30 min    Short Term Goals: Week 2:  OT Short Term Goal 1 (Week 2): Pt will maintain static sitting balance for 3 mins EOB with min assist in preparation for selfcare tasks. OT Short Term Goal 2 (Week 2): Pt will complete UB bathing in supported sitting with no more than min assist for two consecutive sessions. OT Short Term Goal 3 (Week 2): Pt will locate grooming and bathing items placed left of midline with no more than mod instructional cueing. OT Short Term Goal 4 (Week 2): Pt will donn a pullover shirt with mod assist following hemi dressing techniques.  Skilled Therapeutic Interventions/Progress Updates:  Patient met lying supine in be in agreement with OT treatment session with focus on self-care re-education a detailed below. Min A for completion of self-feeding task with patient demonstrating increased wheezing at rest. SpO2 98% on 2L via [place]. RN notified and respiratory called for breathing treatment. Patient completed face washing in supine with set-up assist. Repositioning to facilitate posture. Session concluded with patient lying supine in bed with HOB elevated, bed alarm activated and call bell within reach.     Therapy Documentation Precautions:  Precautions Precautions: Fall, Other (comment) Precaution Comments: supplemental O2, L side inattention, L visual field deficits, left side sensory deficits, left hemiparesis, pusher to the left Restrictions Weight Bearing Restrictions: No LLE Weight Bearing: Non weight bearing General: General OT Amount of Missed Time: 30 Minutes  Therapy/Group: Individual Therapy  [name] 09/01/2019, 8:45 AM

## 2019-09-01 NOTE — Progress Notes (Signed)
Occupational Therapy Session Note  Patient Details  Name: Caitlin Vasquez MRN: [id number] Date of Birth: 1945-03-16  Today's Date: 09/01/2019 OT Individual Time: 1131-1157 OT Individual Time Calculation (min): 26 min   Skilled Therapeutic Interventions/Progress Updates:    Pt greeted in bed and asleep, difficult to rouse. Applied cold wash cloth to face and sternal rub to chest. +2 for boosting up in bed which did not appear to affect alertness. OT began playing upbeat music and then she became more awake at this time. Worked on coordinating her Lt UE with the Rt when clapping hands, swaying arms, and flexing/extending elbows, pt needing HOH assistance for this due to Lt sided coordination deficits. 02 sats hovered between 94-97% on 2L during session. Washed her hands using sanitizer before lunch with pt needing HOH as well. At end of tx pt remained in bed with all needs within reach. Tx focus placed on activity tolerance and Lt NMR.   Therapy Documentation Precautions:  Precautions Precautions: Fall, Other (comment) Precaution Comments: supplemental O2, L side inattention, L visual field deficits, left side sensory deficits, left hemiparesis, pusher to the left Restrictions Weight Bearing Restrictions: No LLE Weight Bearing: Non weight bearing Vital Signs: Therapy Vitals Temp: (!) 97.5 F (36.4 C) Temp Source: Oral Pulse Rate: 75 Resp: 18 BP: 119/68 Patient Position (if appropriate): Sitting Oxygen Therapy SpO2: 99 % O2 Device: Nasal Cannula Pain: in lower back, RN made aware of pts request for pain medicine as well as antinausea medicine at end of tx Pain Assessment Pain Score: 0-No pain ADL: ADL Eating: Minimal assistance Where Assessed-Eating: Bed level Grooming: Minimal assistance Where Assessed-Grooming: Bed level Upper Body Bathing: Maximal assistance Where Assessed-Upper Body Bathing: Edge of bed Lower Body Bathing: Dependent Where Assessed-Lower Body Bathing: Edge  of bed, Bed level Upper Body Dressing: Dependent Where Assessed-Upper Body Dressing: Edge of bed Lower Body Dressing: Dependent Where Assessed-Lower Body Dressing: Edge of bed Toileting: Dependent Where Assessed-Toileting: Bedside Commode Toilet Transfer: Dependent Toilet Transfer Method: Animal nutritionist Equipment: Bedside commode      Therapy/Group: Individual Therapy  Derra Shartzer A Gisselle Galvis 09/01/2019, 4:32 PM

## 2019-09-01 NOTE — Plan of Care (Signed)
  Problem: Consults Goal: RH STROKE PATIENT EDUCATION Description: See Patient Education module for education specifics  Outcome: Progressing   Problem: RH BOWEL ELIMINATION Goal: RH STG MANAGE BOWEL WITH ASSISTANCE Description: STG Manage Bowel with mod Assistance. Outcome: Progressing Goal: RH STG MANAGE BOWEL W/MEDICATION W/ASSISTANCE Description: STG Manage Bowel with Medication with mod I Assistance. Outcome: Progressing   Problem: RH BLADDER ELIMINATION Goal: RH STG MANAGE BLADDER WITH ASSISTANCE Description: STG Manage Bladder With mod Assistance Outcome: Progressing   Problem: RH SKIN INTEGRITY Goal: RH STG SKIN FREE OF INFECTION/BREAKDOWN Description: No additional breakdown will occur while iin rehab with mod assistance  Outcome: Progressing Goal: RH STG MAINTAIN SKIN INTEGRITY WITH ASSISTANCE Description: STG Maintain Skin Integrity With mod Assistance. Outcome: Progressing   Problem: RH SAFETY Goal: RH STG ADHERE TO SAFETY PRECAUTIONS W/ASSISTANCE/DEVICE Description: STG Adhere to Safety Precautions With mod Assistance/Device. Outcome: Progressing   Problem: RH PAIN MANAGEMENT Goal: RH STG PAIN MANAGED AT OR BELOW PT'S PAIN GOAL Description: Pt will report pain of less than 4 with min assistance Outcome: Progressing   Problem: RH KNOWLEDGE DEFICIT Goal: RH STG INCREASE KNOWLEDGE OF HYPERTENSION Description: Family will review stroke book and be able to identify factors related to hypertension with min assist Outcome: Progressing Goal: RH STG INCREASE KNOWLEDGE OF STROKE PROPHYLAXIS Description: Family will be able to identify stroke prophylaxis with min assist Outcome: Progressing   Problem: RH KNOWLEDGE DEFICIT Goal: RH STG INCREASE KNOWLEGDE OF HYPERLIPIDEMIA Description: Family will be able to identify factors related to HLD  and secondary stroke, dietary restrictions and medication management for with min assist  Outcome: Progressing   Problem: RH  Vision Goal: RH LTG Vision (Specify) Outcome: Progressing   

## 2019-09-01 NOTE — Progress Notes (Signed)
[facility] PHYSICAL MEDICINE & REHABILITATION PROGRESS NOTE   Subjective/Complaints: Patient seen laying in bed this morning.  She states she slept fairly well overnight.  She is sleepy this AM. Yesterday, she was noted to have shortness of breath, discussed with nursing.  ROS: Denies CP, SOB, N/V/D  Objective:   DG Chest 2 View  Result Date: 08/31/2019 CLINICAL DATA:  Dyspnea, history COPD EXAM: CHEST - 2 VIEW COMPARISON:  08/29/2019 FINDINGS: Normal heart size, mediastinal contours, and pulmonary vascularity. Atherosclerotic calcification aorta. Bronchitic and emphysematous changes consistent with history of COPD. No acute infiltrate, pleural effusion or pneumothorax. Bones demineralized with chronic RIGHT rotator cuff tear. Retained contrast in colon. IMPRESSION: COPD changes without acute infiltrate. Aortic Atherosclerosis (ICD10-I70.0). Electronically Signed   By: Ulyses Southward M.D.   On: 08/31/2019 16:29   Recent Labs    08/31/19 1600  WBC 11.2*  HGB 12.0  HCT 37.6  PLT 269   No results for input(s): NA, K, CL, CO2, GLUCOSE, BUN, CREATININE, CALCIUM in the last 72 hours.  Intake/Output Summary (Last 24 hours) at 09/01/2019 0906 Last data filed at 08/31/2019 1830 Gross per 24 hour  Intake 600 ml  Output 375 ml  Net 225 ml     Physical Exam: Vital Signs Blood pressure (!) 131/97, pulse 74, temperature 98.1 F (36.7 C), resp. rate 20, weight 55.6 kg, SpO2 97 %. Constitutional: No distress . Vital signs reviewed. HENT: Normocephalic.  Atraumatic. Eyes: EOMI. No discharge. Cardiovascular: No JVD. RRR.  Respiratory: Normal effort.  No stridor. Bilaterally clear to ausculation. +[place]. GI: Non-distended. BS+.  Skin: Warm and dry.  Intact. Psych: Normal mood.  Normal behavior. Musc: No edema in extremities.  No tenderness in extremities. Neuro: Somnolent.  Motor: LUE: 4 -/5 proximal distal with apraxia, appears to be stable. LLE: 4 -/5 proximal distal  Assessment/Plan: 1.  Functional deficits secondary to cerebellar hemorrhage which require 3+ hours per day of interdisciplinary therapy in a comprehensive inpatient rehab setting.  Physiatrist is providing close team supervision and 24 hour management of active medical problems listed below.  Physiatrist and rehab team continue to assess barriers to discharge/monitor patient progress toward functional and medical goals  Care Tool:  Bathing    Body parts bathed by patient: Left arm, Chest, Abdomen, Front perineal area, Right upper leg, Left upper leg, Face   Body parts bathed by helper: Right lower leg, Left lower leg, Right arm, Buttocks     Bathing assist Assist Level: 2 Helpers     Upper Body Dressing/Undressing Upper body dressing   What is the patient wearing?: Pull over shirt    Upper body assist Assist Level: Maximal Assistance - Patient 25 - 49%    Lower Body Dressing/Undressing Lower body dressing      What is the patient wearing?: Pants, Incontinence brief     Lower body assist Assist for lower body dressing: Total Assistance - Patient < 25% (supine rolling side to side)     Toileting Toileting    Toileting assist Assist for toileting: 2 Helpers     Transfers Chair/bed transfer  Transfers assist  Chair/bed transfer activity did not occur: Safety/medical concerns  Chair/bed transfer assist level: Maximal Assistance - Patient 25 - 49%     Locomotion Ambulation   Ambulation assist   Ambulation activity did not occur: Safety/medical concerns          Walk 10 feet activity   Assist  Walk 10 feet activity did not occur: Safety/medical concerns  Walk 50 feet activity   Assist Walk 50 feet with 2 turns activity did not occur: Safety/medical concerns         Walk 150 feet activity   Assist Walk 150 feet activity did not occur: Safety/medical concerns         Walk 10 feet on uneven surface  activity   Assist Walk 10 feet on uneven surfaces  activity did not occur: Safety/medical concerns         Wheelchair     Assist Will patient use wheelchair at discharge?: No (No Long term goals set by PT)             Wheelchair 50 feet with 2 turns activity    Assist            Wheelchair 150 feet activity     Assist          Blood pressure (!) 131/97, pulse 74, temperature 98.1 F (36.7 C), resp. rate 20, weight 55.6 kg, SpO2 97 %.  Medical Problem List and Plan: 1.  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  Antithrombotics: -DVT/anticoagulation:  Mechanical: Sequential compression devices, below knee Bilateral lower extremities  Vascular ultrasound negative for DVT             -antiplatelet therapy: N/a due to bleed.  3. Pain Management: Back pain resolved with treatment of UTI. Discontinued hydrocodone and will use tramadol or tylenol prn for now.   Appears controlled on 7/25 4. Anxiety disorder/Mood:Neuropsych     limit sedating meds  -antipsychotic agents: N/a 5. Neuropsych: This patient is not fully capable of making decisions on her own behalf. 6. Skin/Wound Care:  Routine pressure relief measures.  7. Fluids/Electrolytes/Nutrition: Strict I/Os.   8. NSTEMI with fluid overload: Treated medically with Coreg, Lisinopril and Lipitor.  9. Severe COPD: Encourage pulmonary hygiene--continue flutter. Wean oxygen to off as able as has not use it for past year per son. Albuterol MDI prn.  Discussed with nursing to maintain oxygen sats in high 80s/low 90s, avoid 100%  Continues to require supplemental oxygen on 7/25 10. E coli UTI: Has completed 7 day antibiotic course on 07/11.  11. HTN: SBP goal <160. Monitor BP --on Norvasc and HCTZ.  Vitals:   09/01/19 0408 09/01/19 0837  BP: (!) 131/97   Pulse: 74   Resp: 20   Temp: 98.1 F (36.7 C)   SpO2: 93% 97%   Slightly labile on  7/25 12. Lethargy: Multifactorial?   Decreased buspar to 2.5mg  after discussion with son 3. Urinary retention with incontinence: Continue monitor voiding with PVR checks/bladder scans 4-6 hours and I/O caht to keep volumes <300 cc.   Flomax started  Some improvement 14. A fib: Not AC candidate due to hemorrhage.  Rate controlled. 15.  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  Advance diet as tolerated 16. Tachypnea:   Xopenex BID   See #9  WBC 11.2 on 7/24  Chest x-ray reviewed, changes consistent with COPD, no acute process  IS  10 puff q 1 hr- pt need reminders  Improved 17.  Bowel incont  Remains incontinent on 7/24 18.  Elevated BUN/creatinine  Holding IVF, labs ordered for tomorrow 19.  Labile blood glucose  Labile on 7/25, monitor for trend  LOS: 13 days A FACE TO FACE EVALUATION WAS PERFORMED  Szymon Foiles Karis Juba 09/01/2019, 9:06 AM

## 2019-09-01 NOTE — Progress Notes (Signed)
Physical Therapy Session Note  Patient Details  Name: Caitlin Vasquez MRN: [id number] Date of Birth: 08/15/1945  Today's Date: 09/01/2019 PT Individual Time: 1300-1345 PT Individual Time Calculation (min): 45 min   Short Term Goals: Week 2:  PT Short Term Goal 1 (Week 2): Pt will perform supine<>sit with mod assist PT Short Term Goal 2 (Week 2): Pt will perform bed<>chair transfers with max assist of 1 consistently PT Short Term Goal 3 (Week 2): Pt will perform sit<>stands with max assist of 1 consistently PT Short Term Goal 4 (Week 2): Pt will tolerate standing position using LRAD at least 30 seconds with max assist of 1      Skilled Therapeutic Interventions/Progress Updates:  Pt resting in bed.  Pt noted to be incontinent of bladder.  Rolling L with supervision; rolling r with min/mod assist for clothing mgt.  Pt performed peri care with set up.  Using bed features, pt sat up with min assist, to R.  Sitting balance with min/max assist due to pusher tendencies.  Without use of RUE, pt able to balance briefly before drifting L, with delayed awareness.  Sit> stand iwht mod assist from raised bed; pivot to wc on R with mod assist. Wc> mat with mod assist.  Blocked practice sit> stand from mat to grasp playing cards on board in front of her, at head-height.    Pt fatigued, although O2 sats 98-100% on 3L/min O2 throughout session.  At end of session, pt tilted back in w/c with needs at hand, seat belt alarm set.        Therapy Documentation Precautions:  Precautions Precautions: Fall, Other (comment) Precaution Comments: supplemental O2, L side inattention, L visual field deficits, left side sensory deficits, left hemiparesis, pusher to the left Restrictions Weight Bearing Restrictions: No LLE Weight Bearing: Non weight bearing General:   Vital Signs: Therapy Vitals Temp: (!) 97.5 F (36.4 C) Temp Source: Oral Pulse Rate: 75 Resp: 18 BP: 119/68 Patient Position (if  appropriate): Sitting Oxygen Therapy SpO2: 99 % O2 Device: Nasal Cannula Pain: Pain Assessment Pain Scale: 0-10 Pain Score: 0-No pain Mobility:   Locomotion :    Trunk/Postural Assessment :    Balance:   Exercises:   Other Treatments:      Therapy/Group: Individual Therapy  Denaya Horn 09/01/2019, 2:38 PM

## 2019-09-01 NOTE — Progress Notes (Signed)
Patient noted with increased WOB intermittenly throughout the day with expiratory wheezing noted upon auscultation. Neb tx given this am per respiratory with minimal effects noted. Vital signs stable. O2 sat 99% on O2 2lpm via [place].  Dr. Allena Katz notified, O2 decreased to 1 lpm via [place], with order to titrate O2 to keep O2 sat between 88%-92% as patient has COPD. Chest xray ordered and completed-unremarkable. Pt currently on room air, O2 sats noted at 93%. Will continue to monitor.

## 2019-09-01 NOTE — Progress Notes (Signed)
Speech Language Pathology Daily Session Note  Patient Details  Name: Caitlin Vasquez MRN: [id number] Date of Birth: 06-03-1945  Today's Date: 09/01/2019 SLP Individual Time: 0935-1000 SLP Individual Time Calculation (min): 25 min  Short Term Goals: Week 2: SLP Short Term Goal 1 (Week 2): Pt will scan left of midline during functional tasks with mod A verbal and visual cues. SLP Short Term Goal 2 (Week 2): Pt will demonstrate sustained attention in 10 minute intervals during functional tasks with min A verbal cues for redirection, SLP Short Term Goal 3 (Week 2): Pt will idenitfy 1 swallow or cognitive and 1 phsyical deficits from acute injury with max A verbal cues. SLP Short Term Goal 4 (Week 2): Pt will use compensatory aids or strategies to recall daily information with Mod A cues. SLP Short Term Goal 5 (Week 2): Pt will demonstrate ability to problem solving functional basic situations with Mod A multimodal cues. SLP Short Term Goal 6 (Week 2): Pt will consume trials of thin liquids with minmal overt s/sx aspiration X3 prior to repeat MBSS.  Skilled Therapeutic Interventions:  Pt was seen for skilled ST targeting cognitive goals.  Upon arrival, pt was asleep and required a cold compress to the forehead to awaken sufficiently for participation in therapy.  Pt was laying diagonally across the bed and during repositioning pt was noted to be incontinent of urine with no awareness.  Pt needed mod cues to cross her body's midline when assisting in hygiene, donning clean brief, and repositioning in bed.  Pt reported still feeling the urge to urinate but declined bedpan or getting to toilet to void.  Discussed with nursing as pt's urine was also strongly malodorous.  Per report, pt is due for bladder scan and may need cathing.  Pt was handed off to nurse tech in bed.  Continue per current plan of care.    Pain Pain Assessment Pain Scale: 0-10 Pain Score: 0-No pain  Therapy/Group: Individual  Therapy  Kerri Asche, Melanee Spry 09/01/2019, 12:21 PM

## 2019-09-02 ENCOUNTER — Inpatient Hospital Stay (HOSPITAL_COMMUNITY): Payer: Medicare Other | Admitting: Occupational Therapy

## 2019-09-02 ENCOUNTER — Inpatient Hospital Stay (HOSPITAL_COMMUNITY): Payer: Medicare Other | Admitting: Physical Therapy

## 2019-09-02 ENCOUNTER — Inpatient Hospital Stay (HOSPITAL_COMMUNITY): Payer: Medicare Other | Admitting: Speech Pathology

## 2019-09-02 LAB — GLUCOSE, CAPILLARY
Glucose-Capillary: 86 mg/dL (ref 70–99)
Glucose-Capillary: 98 mg/dL (ref 70–99)
Glucose-Capillary: 108 mg/dL — ABNORMAL HIGH (ref 70–99)
Glucose-Capillary: 99 mg/dL (ref 70–99)

## 2019-09-02 LAB — CBC
HCT: 36 % (ref 36.0–46.0)
Hemoglobin: 11.7 g/dL — ABNORMAL LOW (ref 12.0–15.0)
MCH: 29.1 pg (ref 26.0–34.0)
MCHC: 32.5 g/dL (ref 30.0–36.0)
MCV: 89.6 fL (ref 80.0–100.0)
Platelets: 244 10*3/uL (ref 150–400)
RBC: 4.02 MIL/uL (ref 3.87–5.11)
RDW: 13.4 % (ref 11.5–15.5)
WBC: 11.5 10*3/uL — ABNORMAL HIGH (ref 4.0–10.5)
nRBC: 0 % (ref 0.0–0.2)

## 2019-09-02 LAB — BASIC METABOLIC PANEL
Anion gap: 10 (ref 5–15)
BUN: 21 mg/dL (ref 8–23)
CO2: 24 mmol/L (ref 22–32)
Calcium: 9 mg/dL (ref 8.9–10.3)
Chloride: 109 mmol/L (ref 98–111)
Creatinine, Ser: 1.2 mg/dL — ABNORMAL HIGH (ref 0.44–1.00)
GFR calc Af Amer: 51 mL/min — ABNORMAL LOW (ref >60)
GFR calc non Af Amer: 44 mL/min — ABNORMAL LOW (ref >60)
Glucose, Bld: 97 mg/dL (ref 70–99)
Potassium: 4.2 mmol/L (ref 3.5–5.1)
Sodium: 143 mmol/L (ref 135–145)

## 2019-09-02 MED ORDER — BUDESONIDE 0.5 MG/2ML IN SUSP: 0.2500 mg | Freq: Two times a day (BID) | RESPIRATORY_TRACT | Status: DC

## 2019-09-02 MED ORDER — BUDESONIDE 0.5 MG/2ML IN SUSP
0.2500 mg | Freq: Two times a day (BID) | RESPIRATORY_TRACT | Status: DC
  Administered 2019-09-03 (×2): 0.25 mg via RESPIRATORY_TRACT
  Filled 2019-09-02 (×6): qty 2

## 2019-09-02 NOTE — Plan of Care (Signed)
  Problem: Consults Goal: RH STROKE PATIENT EDUCATION Description: See Patient Education module for education specifics  Outcome: Progressing   Problem: RH BOWEL ELIMINATION Goal: RH STG MANAGE BOWEL WITH ASSISTANCE Description: STG Manage Bowel with mod Assistance. Outcome: Progressing Goal: RH STG MANAGE BOWEL W/MEDICATION W/ASSISTANCE Description: STG Manage Bowel with Medication with mod I Assistance. Outcome: Progressing   Problem: RH BLADDER ELIMINATION Goal: RH STG MANAGE BLADDER WITH ASSISTANCE Description: STG Manage Bladder With mod Assistance Outcome: Progressing   Problem: RH SKIN INTEGRITY Goal: RH STG SKIN FREE OF INFECTION/BREAKDOWN Description: No additional breakdown will occur while iin rehab with mod assistance  Outcome: Progressing Goal: RH STG MAINTAIN SKIN INTEGRITY WITH ASSISTANCE Description: STG Maintain Skin Integrity With mod Assistance. Outcome: Progressing   Problem: RH SAFETY Goal: RH STG ADHERE TO SAFETY PRECAUTIONS W/ASSISTANCE/DEVICE Description: STG Adhere to Safety Precautions With mod Assistance/Device. Outcome: Progressing   Problem: RH PAIN MANAGEMENT Goal: RH STG PAIN MANAGED AT OR BELOW PT'S PAIN GOAL Description: Pt will report pain of less than 4 with min assistance Outcome: Progressing   Problem: RH KNOWLEDGE DEFICIT Goal: RH STG INCREASE KNOWLEDGE OF HYPERTENSION Description: Family will review stroke book and be able to identify factors related to hypertension with min assist Outcome: Progressing Goal: RH STG INCREASE KNOWLEDGE OF STROKE PROPHYLAXIS Description: Family will be able to identify stroke prophylaxis with min assist Outcome: Progressing   Problem: RH KNOWLEDGE DEFICIT Goal: RH STG INCREASE KNOWLEGDE OF HYPERLIPIDEMIA Description: Family will be able to identify factors related to HLD  and secondary stroke, dietary restrictions and medication management for with min assist  Outcome: Progressing   Problem: RH  Vision Goal: RH LTG Vision (Specify) Outcome: Progressing   

## 2019-09-02 NOTE — Progress Notes (Signed)
Speech Language Pathology Weekly Progress Note  Patient Details  Name: Caitlin Vasquez MRN: [id number] Date of Birth: [birth year]  Beginning of progress report period: August 26, 2019 End of progress report period: September 03, 2019  Short Term Goals: Week 2: SLP Short Term Goal 1 (Week 2): Pt will scan left of midline during functional tasks with mod A verbal and visual cues. SLP Short Term Goal 1 - Progress (Week 2): Met SLP Short Term Goal 2 (Week 2): Pt will demonstrate sustained attention in 10 minute intervals during functional tasks with min A verbal cues for redirection, SLP Short Term Goal 2 - Progress (Week 2): Met SLP Short Term Goal 3 (Week 2): Pt will idenitfy 1 swallow or cognitive and 1 phsyical deficits from acute injury with max A verbal cues. SLP Short Term Goal 3 - Progress (Week 2): Met SLP Short Term Goal 4 (Week 2): Pt will use compensatory aids or strategies to recall daily information with Mod A cues. SLP Short Term Goal 4 - Progress (Week 2): Met SLP Short Term Goal 5 (Week 2): Pt will demonstrate ability to problem solving functional basic situations with Mod A multimodal cues. SLP Short Term Goal 5 - Progress (Week 2): Met SLP Short Term Goal 6 (Week 2): Pt will consume trials of thin liquids with minmal overt s/sx aspiration X3 prior to repeat MBSS. SLP Short Term Goal 6 - Progress (Week 2): Met    New Short Term Goals: Week 3: SLP Short Term Goal 1 (Week 3): STG=LTG due to remaining LOS  Weekly Progress Updates: Pt has made steady functional gains despite some medical instability this week and met 6 out of 6 short term goals. Pt is currently ~Mod assist for due to cognitive impairments impacting her short term memory, functional problem solving and emergent awareness, attention, and visual scanning (left inattention requiring consistent cues). Pt is consuming dysphagia 3 solid texture diet with nectar thick liquids; although she participated in another MBSS over the  last week, she continues to demonstrate silent aspiration of thin liquids. Therefore, liquids were not advanced and ST is now targeting pharyngeal strengthening exercises as pt is able. Pt and family education is ongoing. Pt would continue to benefit from skilled ST while inpatient in order to maximize functional independence and reduce burden of care prior to discharge. Anticipate that pt will need 24/7 supervision at discharge in addition to [place] follow up at next level of care.       Intensity: Minumum of 1-2 x/day, 30 to 90 minutes Frequency: 3 to 5 out of 7 days Duration/Length of Stay: 09/12/19 Treatment/Interventions: Cognitive remediation/compensation;Cueing hierarchy;Dysphagia/aspiration precaution training;Patient/family education;Internal/external aids;Functional tasks    Arbutus Leas 09/02/2019, 7:11 AM

## 2019-09-02 NOTE — Progress Notes (Signed)
[facility] PHYSICAL MEDICINE & REHABILITATION PROGRESS NOTE   Subjective/Complaints:   Pt resting quitly , no SOB when I entered room but admitted to SOB when questioned  ROS: Denies CP,  N/V/D  Objective:   DG Chest 2 View  Result Date: 09/01/2019 CLINICAL DATA:  Dyspnea EXAM: CHEST - 2 VIEW COMPARISON:  08/31/2019 FINDINGS: The lungs are mildly hyperinflated in keeping with changes of underlying COPD, unchanged. No superimposed focal pulmonary nodule or infiltrate. No pneumothorax or pleural effusion. Cardiac size within normal limits. Pulmonary vascularity is normal. No acute bone abnormality. IMPRESSION: No active cardiopulmonary disease.  COPD. Electronically Signed   By: Helyn Numbers MD   On: 09/01/2019 17:49   DG Chest 2 View  Result Date: 08/31/2019 CLINICAL DATA:  Dyspnea, history COPD EXAM: CHEST - 2 VIEW COMPARISON:  08/29/2019 FINDINGS: Normal heart size, mediastinal contours, and pulmonary vascularity. Atherosclerotic calcification aorta. Bronchitic and emphysematous changes consistent with history of COPD. No acute infiltrate, pleural effusion or pneumothorax. Bones demineralized with chronic RIGHT rotator cuff tear. Retained contrast in colon. IMPRESSION: COPD changes without acute infiltrate. Aortic Atherosclerosis (ICD10-I70.0). Electronically Signed   By: Ulyses Southward M.D.   On: 08/31/2019 16:29   Recent Labs    08/31/19 1600 09/02/19 0730  WBC 11.2* 11.5*  HGB 12.0 11.7*  HCT 37.6 36.0  PLT 269 244   Recent Labs    09/02/19 0730  NA 143  K 4.2  CL 109  CO2 24  GLUCOSE 97  BUN 21  CREATININE 1.20*  CALCIUM 9.0    Intake/Output Summary (Last 24 hours) at 09/02/2019 0855 Last data filed at 09/02/2019 0700 Gross per 24 hour  Intake 386 ml  Output 1 ml  Net 385 ml     Physical Exam: Vital Signs Blood pressure (!) 159/73, pulse 81, temperature 97.8 F (36.6 C), resp. rate 20, weight 55.4 kg, SpO2 96 %.  General: No acute distress Mood and affect  are appropriate Heart: Regular rate and rhythm no rubs murmurs or extra sounds Lungs: Clear to auscultation, breathing unlabored, no rales or wheezes Abdomen: Positive bowel sounds, soft nontender to palpation, nondistended Extremities: No clubbing, cyanosis, or edema Skin: No evidence of breakdown, no evidence of rash Neurologic: Cranial nerves II through XII intact, motor strength is 5/5 in RIght and 4/5 left deltoid, bicep, tricep, grip, hip flexor, knee extensors, ankle dorsiflexor and plantar flexor Sensory exam absent LUE and LLE  Cerebellar exam normal finger to nose to finger as well as heel to shin in bilateral upper and lower extremities Musculoskeletal: Full range of motion in all 4 extremities. No joint swelling   Assessment/Plan: 1. Functional deficits secondary to cerebellar hemorrhage which require 3+ hours per day of interdisciplinary therapy in a comprehensive inpatient rehab setting.  Physiatrist is providing close team supervision and 24 hour management of active medical problems listed below.  Physiatrist and rehab team continue to assess barriers to discharge/monitor patient progress toward functional and medical goals  Care Tool:  Bathing    Body parts bathed by patient: Left arm, Chest, Abdomen, Front perineal area, Right upper leg, Left upper leg, Face   Body parts bathed by helper: Right lower leg, Left lower leg, Right arm, Buttocks     Bathing assist Assist Level: 2 Helpers     Upper Body Dressing/Undressing Upper body dressing   What is the patient wearing?: Pull over shirt    Upper body assist Assist Level: Maximal Assistance - Patient 25 - 49%  Lower Body Dressing/Undressing Lower body dressing      What is the patient wearing?: Pants, Incontinence brief     Lower body assist Assist for lower body dressing: Total Assistance - Patient < 25% (supine rolling side to side)     Toileting Toileting    Toileting assist Assist for toileting: 2  Helpers     Transfers Chair/bed transfer  Transfers assist  Chair/bed transfer activity did not occur: Safety/medical concerns  Chair/bed transfer assist level: Moderate Assistance - Patient 50 - 74%     Locomotion Ambulation   Ambulation assist   Ambulation activity did not occur: Safety/medical concerns          Walk 10 feet activity   Assist  Walk 10 feet activity did not occur: Safety/medical concerns        Walk 50 feet activity   Assist Walk 50 feet with 2 turns activity did not occur: Safety/medical concerns         Walk 150 feet activity   Assist Walk 150 feet activity did not occur: Safety/medical concerns         Walk 10 feet on uneven surface  activity   Assist Walk 10 feet on uneven surfaces activity did not occur: Safety/medical concerns         Wheelchair     Assist Will patient use wheelchair at discharge?: No (No Long term goals set by PT)             Wheelchair 50 feet with 2 turns activity    Assist            Wheelchair 150 feet activity     Assist          Blood pressure (!) 159/73, pulse 81, temperature 97.8 F (36.6 C), resp. rate 20, weight 55.4 kg, SpO2 96 %.  Medical Problem List and Plan: 1.  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  Antithrombotics: -DVT/anticoagulation:  Mechanical: Sequential compression devices, below knee Bilateral lower extremities  Vascular ultrasound negative for DVT             -antiplatelet therapy: N/a due to bleed.  3. Pain Management: Back pain resolved with treatment of UTI. Discontinued hydrocodone and will use tramadol or tylenol prn for now.   Appears controlled on 7/25 4. Anxiety disorder/Mood:Neuropsych     limit sedating meds  -antipsychotic agents: N/a 5. Neuropsych: This patient is not fully capable of making  decisions on her own behalf. 6. Skin/Wound Care:  Routine pressure relief measures.  7. Fluids/Electrolytes/Nutrition: Strict I/Os.   8. NSTEMI with fluid overload: Treated medically with Coreg, Lisinopril and Lipitor.  9. Severe COPD: Encourage pulmonary hygiene--continue flutter. Wean oxygen to off as able as has not use it for past year per son. Albuterol MDI prn.  Discussed with nursing to maintain oxygen sats in high 80s/low 90s, avoid 100%  Continues to require supplemental oxygen on 7/25 10. E coli UTI: Has completed 7 day antibiotic course on 07/11.  11. HTN: SBP goal <160. Monitor BP --on Norvasc and HCTZ.  Vitals:   09/01/19 1955 09/02/19 0451  BP: (!) 117/64 (!) 159/73  Pulse: 80 81  Resp: 21 20  Temp: 97.9 F (36.6 C) 97.8 F (36.6 C)  SpO2: 95% 96%   Slightly labile on 7/26 12. Lethargy: Multifactorial?   Decreased buspar to 2.5mg  after discussion with  son 66. Urinary retention with incontinence: Continue monitor voiding with PVR checks/bladder scans 4-6 hours and I/O caht to keep volumes <300 cc.   Flomax started  Some improvement 14. A fib: Not AC candidate due to hemorrhage.  Rate controlled. 15.  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  Advance diet as tolerated 16. Tachypnea:   Xopenex BID   See #9  WBC 11.2 on 7/24  Chest x-ray reviewed, changes consistent with COPD, no acute process  IS  10 puff q 1 hr- pt need reminders  Improved 17.  Bowel incont  Remains incontinent on 7/24 18.  Elevated BUN/creatinine  Holding IVF, labs ordered for tomorrow 19.  Labile blood glucose  Labile on 7/25, monitor for trend  LOS: 14 days A FACE TO FACE EVALUATION WAS PERFORMED  Erick Colace 09/02/2019, 8:55 AM

## 2019-09-02 NOTE — Progress Notes (Signed)
Physical Therapy Session Note  Patient Details  Name: Caitlin Vasquez MRN: [id number] Date of Birth: 1945-04-09  Today's Date: 09/02/2019 PT Individual Time: 1000-1100 PT Individual Time Calculation (min): 60 min   Short Term Goals: Week 2:  PT Short Term Goal 1 (Week 2): Pt will perform supine<>sit with mod assist PT Short Term Goal 2 (Week 2): Pt will perform bed<>chair transfers with max assist of 1 consistently PT Short Term Goal 3 (Week 2): Pt will perform sit<>stands with max assist of 1 consistently PT Short Term Goal 4 (Week 2): Pt will tolerate standing position using LRAD at least 30 seconds with max assist of 1  Skilled Therapeutic Interventions/Progress Updates:    Pt received seated in bed, agreeable to PT session. Pt reports feeling SOA, SpO2 95% on room air. Pt also noted to be wheezing at times during session, reports she received a breathing treatment prior to start of therapy session. No complaints of pain. Pt requesting to eat some yogurt. Pt is setup A for yogurt and this therapist provided Supervision with cues for small bites while pt fed herself yogurt. Pt then agreeable to get dressed and get up to chair. Pt found to be incontinent of urine. Pt is dependent for pericare, brief change, and donning of pants at bed level. Assisted pt with donning socks and shoes. Pt is mod A for anterior leaning with use of RUE on bedrail in order to [facility] hospital gown and don shirt with mod to max A. Semi-reclined in bed to sitting EOB with mod A for LLE management and trunk control. Pt with significant L lateral lean in sitting. Stand pivot transfer bed to w/c with max A to the R with SBA from a 2nd person for safety. Assisted pt with brushing out her hair while seated in TIS w/c at sink. Pt left semi-reclined in TIS w/c in room with needs in reach, quick release belt and chair alarm in place.  Therapy Documentation Precautions:  Precautions Precautions: Fall, Other (comment) Precaution  Comments: supplemental O2, L side inattention, L visual field deficits, left side sensory deficits, left hemiparesis, pusher to the left Restrictions Weight Bearing Restrictions: No LLE Weight Bearing: Non weight bearing    Therapy/Group: Individual Therapy   Peter Congo, PT, DPT  09/02/2019, 12:41 PM

## 2019-09-02 NOTE — Progress Notes (Signed)
Occupational Therapy Session Note  Patient Details  Name: Caitlin Vasquez MRN: [id number] Date of Birth: 1945-12-02  Today's Date: 09/02/2019 OT Individual Time: 5364-6803 OT Individual Time Calculation (min): 34 min    Short Term Goals: Week 2:  OT Short Term Goal 1 (Week 2): Pt will maintain static sitting balance for 3 mins EOB with min assist in preparation for selfcare tasks. OT Short Term Goal 2 (Week 2): Pt will complete UB bathing in supported sitting with no more than min assist for two consecutive sessions. OT Short Term Goal 3 (Week 2): Pt will locate grooming and bathing items placed left of midline with no more than mod instructional cueing. OT Short Term Goal 4 (Week 2): Pt will donn a pullover shirt with mod assist following hemi dressing techniques.  Skilled Therapeutic Interventions/Progress Updates:    patient seated in w/c, alert, oriented to day, month, year - pleasant and cooperative.  She denies pain but states that she is tired and would like to get back in bed at end of session.  Sit pivot transfer w/c to edge of bed with mod A of one, stand by of second person.  Completed seated balance and trunk mobility activities with focus on midline, trunk rotation/extension, posture, reaching activity both right and left, lateral leans - continues with left side inattention and lean with limited awareness of body position.  Sitting to supine with mod A of 2.  Patient states that she needs her brief changed - wet with urine.  Min a to roll right, CGA to roll left with cues - max A for pants down/up , mod A for hygiene.  She remained in bed at close of session with bed alarm set and call bell in reach.    Therapy Documentation Precautions:  Precautions Precautions: Fall, Other (comment) Precaution Comments: supplemental O2, L side inattention, L visual field deficits, left side sensory deficits, left hemiparesis, pusher to the left Restrictions Weight Bearing Restrictions: No LLE  Weight Bearing: Non weight bearing  Therapy/Group: Individual Therapy  Barrie Lyme 09/02/2019, 7:42 AM

## 2019-09-02 NOTE — Progress Notes (Signed)
Speech Language Pathology Daily Session Note  Patient Details  Name: Caitlin Vasquez MRN: [id number] Date of Birth: [birth year]  Today's Date: 09/02/2019 SLP Individual Time: 1347-1430 SLP Individual Time Calculation (min): 43 min  Short Term Goals: Week 2: SLP Short Term Goal 1 (Week 2): Pt will scan left of midline during functional tasks with mod A verbal and visual cues. SLP Short Term Goal 2 (Week 2): Pt will demonstrate sustained attention in 10 minute intervals during functional tasks with min A verbal cues for redirection, SLP Short Term Goal 3 (Week 2): Pt will idenitfy 1 swallow or cognitive and 1 phsyical deficits from acute injury with max A verbal cues. SLP Short Term Goal 4 (Week 2): Pt will use compensatory aids or strategies to recall daily information with Mod A cues. SLP Short Term Goal 5 (Week 2): Pt will demonstrate ability to problem solving functional basic situations with Mod A multimodal cues. SLP Short Term Goal 6 (Week 2): Pt will consume trials of thin liquids with minmal overt s/sx aspiration X3 prior to repeat MBSS.  Skilled Therapeutic Interventions: Pt was seen for skilled ST targeting cognitive goals. Pt reported nausea and s/sx concerning for GERD upon arrival, therefore no swallow goals were targeted. Spoke with pt's RN regarding s/sx reflux and pt's request for medication for GERD (which pt reports she took at home). SLP also facilitated session with Mod A verbal and visual cues for visual scanning and error awareness when discriminating between and sorting cash/coins. She also required mod A verbal and visual cues for a problem solving strategy when totaling the cash/coins and calculating change. Reduced min A verbal and visual cues were provided for problem solving and error awareness when displaying set amounts of change. Pt left sitting in chair with alarm set and needs within reach. Continue per current plan of care.          Pain Pain Assessment Pain  Scale: 0-10 (no pain but reporting nausea) Pain Score: 0-No pain  Therapy/Group: Individual Therapy  Little Ishikawa 09/02/2019, 7:28 AM

## 2019-09-03 ENCOUNTER — Inpatient Hospital Stay (HOSPITAL_COMMUNITY): Payer: Medicare Other | Admitting: Occupational Therapy

## 2019-09-03 ENCOUNTER — Inpatient Hospital Stay (HOSPITAL_COMMUNITY): Payer: Medicare Other | Admitting: Physical Therapy

## 2019-09-03 LAB — GLUCOSE, CAPILLARY
Glucose-Capillary: 95 mg/dL (ref 70–99)
Glucose-Capillary: 96 mg/dL (ref 70–99)
Glucose-Capillary: 97 mg/dL (ref 70–99)
Glucose-Capillary: 103 mg/dL — ABNORMAL HIGH (ref 70–99)

## 2019-09-03 NOTE — Progress Notes (Signed)
Physical Therapy Session Note  Patient Details  Name: Caitlin Vasquez MRN: [id number] Date of Birth: Jun 20, 1945  Today's Date: 09/03/2019 PT Individual Time: 1306-1330 PT Individual Time Calculation (min): 24 min   Short Term Goals: Week 2:  PT Short Term Goal 1 (Week 2): Pt will perform supine<>sit with mod assist PT Short Term Goal 2 (Week 2): Pt will perform bed<>chair transfers with max assist of 1 consistently PT Short Term Goal 3 (Week 2): Pt will perform sit<>stands with max assist of 1 consistently PT Short Term Goal 4 (Week 2): Pt will tolerate standing position using LRAD at least 30 seconds with max assist of 1   Skilled Therapeutic Interventions/Progress Updates:   Pt received sitting in WC, demanding to be returned to bed. Pt performed stand pivot transfer to bed with max-mod assist from PT. Once EOB pt engaged in sitting balance and deep core activation training. Initially required mod assist to prevent lateral lean to the L but once pt WB thorugh R elbow on two  Pillows able to sit with CGA. Anterior weight shift training to push forward through BUE with elbows extended and then return to netrual with manual resistance. Shoulder flexio over head with min assist to maintain balance x 5 BUE. Sit>supine with mod assist to coordinate BLE. Scooting to [facility] with min assist to stabilize BLE and bridge through feet. SAQ x 5 BLE, Heel slides x 5 BLE. Cues for attention to task and decreased speed to improve neuromotor control. Pt noted to be falling asleep when not engaged in conversation of activity. Pt left in bed with call bell in reach and all needs met.      Therapy Documentation Precautions:  Precautions Precautions: Fall, Other (comment) Precaution Comments: supplemental O2, L side inattention, L visual field deficits, left side sensory deficits, left hemiparesis, pusher to the left Restrictions Weight Bearing Restrictions: No LLE Weight Bearing: Non weight bearing    Vital  Signs: Oxygen Therapy SpO2: 92 % O2 Device: Nasal Cannula O2 Flow Rate (L/min): 1 L/min Pain: Pain Assessment Pain Scale: 0-10 Pain Score: 0-No pain    Therapy/Group: Individual Therapy  Lorie Phenix 09/03/2019, 1:31 PM

## 2019-09-03 NOTE — Progress Notes (Signed)
Physical Therapy Session Note  Patient Details  Name: Caitlin Vasquez MRN: [id number] Date of Birth: 04/05/1945  Today's Date: 09/03/2019 PT Individual Time: 0905-0945 PT Individual Time Calculation (min): 40 min   Short Term Goals: Week 2:  PT Short Term Goal 1 (Week 2): Pt will perform supine<>sit with mod assist PT Short Term Goal 2 (Week 2): Pt will perform bed<>chair transfers with max assist of 1 consistently PT Short Term Goal 3 (Week 2): Pt will perform sit<>stands with max assist of 1 consistently PT Short Term Goal 4 (Week 2): Pt will tolerate standing position using LRAD at least 30 seconds with max assist of 1  Skilled Therapeutic Interventions/Progress Updates:   Pt received supine in bed and agreeable to PT. But pt reports that she had gotten back in bed prior to treatment. PT instructed pt in rolling R and L, and was noted to have been incontinent of bowel. Additional rolling R and L x 3 with min assist overall for LE positioning for PT to perform pericare with pt at bed level. RN present prior to pt hygiene to apply 1l/min 02, which pt stayed on the entire session. RN also reports that pt had not been out of bed this AM. Additional NMR blocked practice rolling R and L x 5 bil. Chest press with therapy ball x 10 with max assist to maintain grasp on the L. Pt Left supine in bed with call bell in reach and all needs met.          Therapy Documentation Precautions:  Precautions Precautions: Fall, Other (comment) Precaution Comments: supplemental O2, L side inattention, L visual field deficits, left side sensory deficits, left hemiparesis, pusher to the left Restrictions Weight Bearing Restrictions: No LLE Weight Bearing: Non weight bearing    Vital Signs: Oxygen Therapy SpO2: 93 % O2 Device: Room Air Pain: denies   Therapy/Group: Individual Therapy  Austin E Tucker 09/03/2019, 9:55 AM  

## 2019-09-03 NOTE — Progress Notes (Addendum)
[facility] PHYSICAL MEDICINE & REHABILITATION PROGRESS NOTE   Subjective/Complaints:   No issues overnite, intermittent SOB   ROS: Denies CP,  N/V/D  Objective:   DG Chest 2 View  Result Date: 09/01/2019 CLINICAL DATA:  Dyspnea EXAM: CHEST - 2 VIEW COMPARISON:  08/31/2019 FINDINGS: The lungs are mildly hyperinflated in keeping with changes of underlying COPD, unchanged. No superimposed focal pulmonary nodule or infiltrate. No pneumothorax or pleural effusion. Cardiac size within normal limits. Pulmonary vascularity is normal. No acute bone abnormality. IMPRESSION: No active cardiopulmonary disease.  COPD. Electronically Signed   By: Helyn Numbers MD   On: 09/01/2019 17:49   Recent Labs    08/31/19 1600 09/02/19 0730  WBC 11.2* 11.5*  HGB 12.0 11.7*  HCT 37.6 36.0  PLT 269 244   Recent Labs    09/02/19 0730  NA 143  K 4.2  CL 109  CO2 24  GLUCOSE 97  BUN 21  CREATININE 1.20*  CALCIUM 9.0    Intake/Output Summary (Last 24 hours) at 09/03/2019 0749 Last data filed at 09/02/2019 1818 Gross per 24 hour  Intake 220 ml  Output --  Net 220 ml     Physical Exam: Vital Signs Blood pressure (!) 141/57, pulse 70, temperature 97.7 F (36.5 C), resp. rate 19, weight 55.4 kg, SpO2 93 %.   General: No acute distress Mood and affect are appropriate Heart: Regular rate and rhythm no rubs murmurs or extra sounds Lungs: Clear to auscultation, breathing unlabored, no rales or wheezes, upper airway sounds Abdomen: Positive bowel sounds, soft nontender to palpation, nondistended Extremities: No clubbing, cyanosis, or edema Skin: No evidence of breakdown, no evidence of rash  Neurologic: Cranial nerves II through XII intact, motor strength is 5/5 in RIght and 4/5 left deltoid, bicep, tricep, grip, hip flexor, knee extensors, ankle dorsiflexor and plantar flexor Sensory exam absent LUE and LLE  Cerebellar exam normal finger to nose to finger as well as heel to shin in bilateral  upper and lower extremities Musculoskeletal: Full range of motion in all 4 extremities. No joint swelling   Assessment/Plan: 1. Functional deficits secondary to cerebellar hemorrhage which require 3+ hours per day of interdisciplinary therapy in a comprehensive inpatient rehab setting.  Physiatrist is providing close team supervision and 24 hour management of active medical problems listed below.  Physiatrist and rehab team continue to assess barriers to discharge/monitor patient progress toward functional and medical goals  Care Tool:  Bathing    Body parts bathed by patient: Left arm, Chest, Abdomen, Front perineal area, Right upper leg, Left upper leg, Face   Body parts bathed by helper: Right lower leg, Left lower leg, Right arm, Buttocks     Bathing assist Assist Level: 2 Helpers     Upper Body Dressing/Undressing Upper body dressing   What is the patient wearing?: Pull over shirt    Upper body assist Assist Level: Maximal Assistance - Patient 25 - 49%    Lower Body Dressing/Undressing Lower body dressing      What is the patient wearing?: Pants, Incontinence brief     Lower body assist Assist for lower body dressing: Total Assistance - Patient < 25% (supine rolling side to side)     Toileting Toileting    Toileting assist Assist for toileting: 2 Helpers     Transfers Chair/bed transfer  Transfers assist  Chair/bed transfer activity did not occur: Safety/medical concerns  Chair/bed transfer assist level: Moderate Assistance - Patient 50 - 74%  Locomotion Ambulation   Ambulation assist   Ambulation activity did not occur: Safety/medical concerns          Walk 10 feet activity   Assist  Walk 10 feet activity did not occur: Safety/medical concerns        Walk 50 feet activity   Assist Walk 50 feet with 2 turns activity did not occur: Safety/medical concerns         Walk 150 feet activity   Assist Walk 150 feet activity did  not occur: Safety/medical concerns         Walk 10 feet on uneven surface  activity   Assist Walk 10 feet on uneven surfaces activity did not occur: Safety/medical concerns         Wheelchair     Assist Will patient use wheelchair at discharge?: No (No Long term goals set by PT)             Wheelchair 50 feet with 2 turns activity    Assist            Wheelchair 150 feet activity     Assist          Blood pressure (!) 141/57, pulse 70, temperature 97.7 F (36.5 C), resp. rate 19, weight 55.4 kg, SpO2 93 %.  Medical Problem List and Plan: 1.  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  Antithrombotics: -DVT/anticoagulation:  Mechanical: Sequential compression devices, below knee Bilateral lower extremities  Vascular ultrasound negative for DVT             -antiplatelet therapy: N/a due to bleed.  3. Pain Management: Back pain resolved with treatment of UTI. Discontinued hydrocodone and will use tramadol or tylenol prn for now.   Appears controlled on 7/25 4. Anxiety disorder/Mood:Neuropsych     limit sedating meds  -antipsychotic agents: N/a 5. Neuropsych: This patient is not fully capable of making decisions on her own behalf. 6. Skin/Wound Care:  Routine pressure relief measures.  7. Fluids/Electrolytes/Nutrition: Strict I/Os.   8. NSTEMI with fluid overload: Treated medically with Coreg, Lisinopril and Lipitor.  9. Severe COPD: Encourage pulmonary hygiene--continue flutter. Wean oxygen to off as able as has not use it for past year per son. Albuterol MDI prn.  Discussed with nursing to maintain oxygen sats in high 80s/low 90s, avoid 100%  Continues to require supplemental oxygen prn 10. E coli UTI: Has completed 7 day antibiotic course on 07/11.  11. HTN: SBP goal <160. Monitor BP --on Norvasc and HCTZ.  Vitals:    09/03/19 0510 09/03/19 0710  BP: (!) 141/57   Pulse: 70   Resp: 19   Temp: 97.7 F (36.5 C)   SpO2: 94% 93%  fair control 7/27 12. Lethargy: Multifactorial?   Decreased buspar to 2.5mg  after discussion with son 52. Urinary retention with incontinence: Continue monitor voiding with PVR checks/bladder scans 4-6 hours and I/O caht to keep volumes <300 cc.   Flomax started  Some improvement 14. A fib: Not AC candidate due to hemorrhage.  Rate controlled. 15.  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  Advance diet as tolerated 16. Tachypnea:   Related to bronchospasm and atelectasis, maintaining sats at rest on RA      IS  10 puff q 1 hr- pt need reminders  Improved 17.  Bowel incont  Remains incontinent on 7/24 18.  Elevated  BUN/creatinine  Holding IVF, labs ordered for tomorrow 19.  Normal CBG, no hx DM will d/c CBG  LOS: 15 days A FACE TO FACE EVALUATION WAS PERFORMED  Erick Colace 09/03/2019, 7:49 AM

## 2019-09-03 NOTE — Progress Notes (Signed)
Occupational Therapy Session Note  Patient Details  Name: Caitlin Vasquez MRN: [id number] Date of Birth: 1945/09/28  Today's Date: 09/03/2019 OT Individual Time: 1100-1158 OT Individual Time Calculation (min): 58 min    Short Term Goals: Week 2:  OT Short Term Goal 1 (Week 2): Pt will maintain static sitting balance for 3 mins EOB with min assist in preparation for selfcare tasks. OT Short Term Goal 2 (Week 2): Pt will complete UB bathing in supported sitting with no more than min assist for two consecutive sessions. OT Short Term Goal 3 (Week 2): Pt will locate grooming and bathing items placed left of midline with no more than mod instructional cueing. OT Short Term Goal 4 (Week 2): Pt will donn a pullover shirt with mod assist following hemi dressing techniques.  Skilled Therapeutic Interventions/Progress Updates:    Patient in bed, asleep, stimulation/change of bed positioning to arouse.  She denies pain.  Family present for therapy session.  She requires encouragement to attempt adl tasks.  Completed LB bathing and dressing bed level.  She is able to left both legs to assist with pants over feet and pull up pants to thighs, dependent for pants over hips via rolling side to side.  Min A to roll left, mod A to roll right.  Supine to sitting with mod A - cues for midline orientation.  She demonstrates inconsistent ability to maintain sitting due to poor attention to task, left neglect and poor midline.  Sit pivot transfer bed to w/c (toward left side) max A of 2 due to poor attention to task and difficulty maintaining upright trunk/posture.  Completed UB bathing and dressing w/c level with mod A for bathing and max A for OH shirt - focus on using left UE for reach and placement with max cues.  Completed sit to stand using bed rail for support right side with max A of one and stand by of 2nd person - she tolerated less than 30 seconds.  She remained sitting in w/c at close of session, seat belt  alarm set, call bell in hand, chair in tilted position - family present.    Therapy Documentation Precautions:  Precautions Precautions: Fall, Other (comment) Precaution Comments: supplemental O2, L side inattention, L visual field deficits, left side sensory deficits, left hemiparesis, pusher to the left Restrictions Weight Bearing Restrictions: No LLE Weight Bearing: Non weight bearing  Therapy/Group: Individual Therapy  Barrie Lyme 09/03/2019, 7:34 AM

## 2019-09-03 NOTE — Plan of Care (Signed)
  Problem: Consults Goal: RH STROKE PATIENT EDUCATION Description: See Patient Education module for education specifics  Outcome: Progressing   Problem: RH BOWEL ELIMINATION Goal: RH STG MANAGE BOWEL WITH ASSISTANCE Description: STG Manage Bowel with mod Assistance. Outcome: Progressing Goal: RH STG MANAGE BOWEL W/MEDICATION W/ASSISTANCE Description: STG Manage Bowel with Medication with mod I Assistance. Outcome: Progressing   Problem: RH BLADDER ELIMINATION Goal: RH STG MANAGE BLADDER WITH ASSISTANCE Description: STG Manage Bladder With mod Assistance Outcome: Progressing   Problem: RH SKIN INTEGRITY Goal: RH STG SKIN FREE OF INFECTION/BREAKDOWN Description: No additional breakdown will occur while iin rehab with mod assistance  Outcome: Progressing Goal: RH STG MAINTAIN SKIN INTEGRITY WITH ASSISTANCE Description: STG Maintain Skin Integrity With mod Assistance. Outcome: Progressing   Problem: RH SAFETY Goal: RH STG ADHERE TO SAFETY PRECAUTIONS W/ASSISTANCE/DEVICE Description: STG Adhere to Safety Precautions With mod Assistance/Device. Outcome: Progressing   Problem: RH PAIN MANAGEMENT Goal: RH STG PAIN MANAGED AT OR BELOW PT'S PAIN GOAL Description: Pt will report pain of less than 4 with min assistance Outcome: Progressing   Problem: RH KNOWLEDGE DEFICIT Goal: RH STG INCREASE KNOWLEDGE OF HYPERTENSION Description: Family will review stroke book and be able to identify factors related to hypertension with min assist Outcome: Progressing Goal: RH STG INCREASE KNOWLEDGE OF STROKE PROPHYLAXIS Description: Family will be able to identify stroke prophylaxis with min assist Outcome: Progressing   Problem: RH KNOWLEDGE DEFICIT Goal: RH STG INCREASE KNOWLEGDE OF HYPERLIPIDEMIA Description: Family will be able to identify factors related to HLD  and secondary stroke, dietary restrictions and medication management for with min assist  Outcome: Progressing   Problem: RH  Vision Goal: RH LTG Vision (Specify) Outcome: Progressing   

## 2019-09-04 ENCOUNTER — Inpatient Hospital Stay (HOSPITAL_COMMUNITY): Payer: Medicare Other | Admitting: Speech Pathology

## 2019-09-04 ENCOUNTER — Inpatient Hospital Stay (HOSPITAL_COMMUNITY): Payer: Medicare Other | Admitting: *Deleted

## 2019-09-04 ENCOUNTER — Inpatient Hospital Stay (HOSPITAL_COMMUNITY): Payer: Medicare Other | Admitting: Physical Therapy

## 2019-09-04 LAB — GLUCOSE, CAPILLARY
Glucose-Capillary: 90 mg/dL (ref 70–99)
Glucose-Capillary: 101 mg/dL — ABNORMAL HIGH (ref 70–99)

## 2019-09-04 NOTE — Progress Notes (Signed)
Physical Therapy Session Note  Patient Details  Name: Caitlin Vasquez MRN: [id number] Date of Birth: 08-15-1945  Today's Date: 09/04/2019 PT Individual Time: 0810-0905 PT Individual Time Calculation (min): 55 min   Short Term Goals: Week 2:  PT Short Term Goal 1 (Week 2): Pt will perform supine<>sit with mod assist PT Short Term Goal 2 (Week 2): Pt will perform bed<>chair transfers with max assist of 1 consistently PT Short Term Goal 3 (Week 2): Pt will perform sit<>stands with max assist of 1 consistently PT Short Term Goal 4 (Week 2): Pt will tolerate standing position using LRAD at least 30 seconds with max assist of 1  Skilled Therapeutic Interventions/Progress Updates:   Pt received supine in bed and agreeable to PT. Rolling R and L to assess continence. Pt noted to have been mildly incontinent of bladder, pt is completely unaware. PT assisted pt to doff soild brief and don clean brief with total A and perform pericare. Sit>supine with mod assist to improve sequencing of log roll technique and UE use to push into sitting. Sitting balance EOB with min assist to improve lateral position of the RUE while PT donned shoes. Stand pivot transfer to to [facility] with LLE blocked and mod assist of 1.  Pt transported to rail iin hall. Sit<>stand x 4 with mod assist and LUE over therapist shoulder. Once in standing, max assist to facilitate knee extension on the L to prevent buckling; pt able to tolater standing upt o 45 seconds. Gait training initiated 2 x 3 steps BLE with max assist to prevent L lateral LOB, improve r weight shift and increase step length on the L. SpO2 at 85% on 1L/min. incerased to 2L/min andSpO2 99%. Pt returned to room and performed squat pivot transfer to bed with mod assist. Sit>supine completed with max assist for BLE management and left supine in bed with call bell in reach and all needs met. Pt on 1L/min at rest in bed at end of session          Therapy Documentation Precautions:   Precautions Precautions: Fall, Other (comment) Precaution Comments: supplemental O2, L side inattention, L visual field deficits, left side sensory deficits, left hemiparesis, pusher to the left Restrictions Weight Bearing Restrictions: No LLE Weight Bearing:  (no WB precautions) Vital Signs: Therapy Vitals Temp: 98.8 F (37.1 C) Pulse Rate: 68 Resp: 20 BP: (!) 149/55 Patient Position (if appropriate): Lying Oxygen Therapy SpO2: 99 % O2 Device: Nasal Cannula Pain: denies  Therapy/Group: Individual Therapy  Lorie Phenix 09/04/2019, 9:08 AM

## 2019-09-04 NOTE — Plan of Care (Signed)
  Problem: RH Attention Goal: LTG Patient will demonstrate this level of attention during functional activites (SLP) Description: LTG:  Patient will will demonstrate this level of attention during functional activites (SLP) Flowsheets (Taken 09/04/2019 1238) LTG: Patient will demonstrate this level of attention during cognitive/linguistic activities with assistance of (SLP): Minimal Assistance - Patient > 75% Note: Goal downgraded due to slower than anticipated progress   Problem: RH Awareness Goal: LTG: Patient will demonstrate awareness during functional activites type of (SLP) Description: LTG: Patient will demonstrate awareness during functional activites type of (SLP) Flowsheets (Taken 09/04/2019 1238) LTG: Patient will demonstrate awareness during cognitive/linguistic activities with assistance of (SLP): Minimal Assistance - Patient > 75% Note: Goal downgraded due to slower than anticipated progress   Problem: RH Pre-functional/Other (Specify) Goal: RH LTG SLP (Specify) 1 Description: RH LTG SLP (Specify) 1 Flowsheets (Taken 09/04/2019 1238) LTG: Other SLP (Specify) 1: Pt will scan left of midline with Mod A multimodal cues. Note: Goal downgraded due to slower than anticipated progress   Problem: RH Problem Solving Goal: LTG Patient will demonstrate problem solving for (SLP) Description: LTG:  Patient will demonstrate problem solving for basic/complex daily situations with cues  (SLP) Flowsheets (Taken 09/04/2019 1238) LTG Patient will demonstrate problem solving for: Minimal Assistance - Patient > 75% Note: Goal downgraded due to slower than anticipated progress   Problem: RH Memory Goal: LTG Patient will use memory compensatory aids to (SLP) Description: LTG:  Patient will use memory compensatory aids to recall biographical/new, daily complex information with cues (SLP) Flowsheets (Taken 09/04/2019 1238) LTG: Patient will use memory compensatory aids to (SLP): Minimal Assistance -  Patient > 75% Note: Goal downgraded due to slower than anticipated progress

## 2019-09-04 NOTE — Progress Notes (Signed)
Pt refused oral care and refused to take dentures out.

## 2019-09-04 NOTE — Patient Care Conference (Signed)
Inpatient RehabilitationTeam Conference and Plan of Care Update Date: 09/04/2019   Time: 10:43   Patient Name: Caitlin Vasquez      Medical Record Number: [id number]  Date of Birth: 06-Sep-1945 Sex: Female         Room/Bed: 4W16C/4W16C-01 Payor Info: Payor: MEDICARE / Plan: MEDICARE PART A AND B / Product Type: *No Product type* /    Admit Date/Time:  08/19/2019  3:23 PM  Primary Diagnosis:  Cerebellar hemorrhage [facility])  Hospital Problems: Principal Problem:   Cerebellar hemorrhage (HCC) Active Problems:   Tachypnea   Urinary retention   Labile blood pressure   Dyspnea   Urinary incontinence   Incontinence of feces   Chronic obstructive pulmonary disease (HCC)   Elevated BUN   Labile blood glucose    Expected Discharge Date: Expected Discharge Date: 09/12/19  Team Members Present: Physician leading conference: Dr. Claudette Laws Care Coodinator Present: Chana Bode, RN, BSN, CRRN;Cecile Sheerer, LCSWA Nurse Present: Donna Bernard, LPN PT Present: Casimiro Needle, PT OT Present: Roney Mans, OT SLP Present: Suzzette Righter, CF-SLP PPS Coordinator present : Edson Snowball, Park Breed, SLP     Current Status/Progress Goal Weekly Team Focus  Bowel/Bladder   Pt is incontinent of b/b. requiring q6h PVRs. LBM 09/03/19  Regain continence of bowel and bladder  Q2h toileting/ PRN   Swallow/Nutrition/ Hydration   Dys 3, nectar, full supervision  Supervision A  pharyngeal strengthening exercises, repeat mbs prior to d/c, independence with swallow strategies   ADL's   LB bathing and dressing at bed level with total A, +2 for transfers and toileting. Pt with still significnat neglect, attention and visual deficits, apraxia Mod A for UB bathing and dressing with A for sitting balance  goals to be downgraded  self care retraining, transfer training, NMR, DME, focus on family education with hands on training.   Mobility   min assit rolling in bed. 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 sit<>stand with mod-max assist and UE support on bed rail. stand pivots with mod/max assist. pt able to initiate steps at rail  min assist overall at transfer level mod assist ambulation.  activity tolerance, midline orientation, forced WB through Affected LE/UE, transfers, pt/family education.   Communication             Safety/Cognition/ Behavioral Observations  Min-Mod  Min-Supervision (downgrading)  visual scanning, functional problems solving, attention, awareness, recall   Pain   pt does not c/o any pain at this time  to remain pain free  Assess pain qshift/prn   Skin   MASD to buttocks, bruisingto R forearm.  promote healing, prevent further breakdown and maintain skin integrity  Assess skin shift.prn     Team Discussion:  Discharge Planning/Teaching Needs:  Goal to discharge home with son Sharia Reeve) and daughter in law to provide care  Family Education pending   Current Update: on target for discharge with son  Current Barriers to Discharge:  Cognition, nutrition  Possible Resolutions to Barriers: Family education for several days with son and daughter in law to practice for Moderate assistance Repeat MBS 09/06/19 to assess diet upgrade  Patient on target to meet rehab goals: no, poor attention, decreased visual scanning and poor mid-line awareness limiting progress and noted desaturation with exercise.  *See Care Plan and progress notes for long and short-term goals.   Revisions to Treatment Plan:  SLP goals downgraded to Moderate assistance Work on bed mobility, mid-line awareness and standing without  pushing    Medical Summary Current Status: occ nausea , Desats with exercise Weekly Focus/Goal: improve continence, improve pushing  Barriers to Discharge: Medical stability   Possible Resolutions to Barriers: IS adn flutter valve use   Continued Need for Acute Rehabilitation Level of Care: The patient requires daily medical  management by a physician with specialized training in physical medicine and rehabilitation for the following reasons: Direction of a multidisciplinary physical rehabilitation program to maximize functional independence : Yes Medical management of patient stability for increased activity during participation in an intensive rehabilitation regime.: Yes Analysis of laboratory values and/or radiology reports with any subsequent need for medication adjustment and/or medical intervention. : Yes   I attest that I was present, lead the team conference, and concur with the assessment and plan of the team.   Chana Bode B 09/04/2019, 3:37 PM

## 2019-09-04 NOTE — Progress Notes (Signed)
Speech Language Pathology Daily Session Note  Patient Details  Name: TABRIA STEINES MRN: [id number] Date of Birth: 01-28-1946  Today's Date: 09/04/2019 SLP Individual Time: 1000-1029 SLP Individual Time Calculation (min): 29 min  Short Term Goals: Week 3: SLP Short Term Goal 1 (Week 3): STG=LTG due to remaining LOS  Skilled Therapeutic Interventions:   Skilled SLP services focused on cognition. Oral care completed prior to beginning tx. Pt was able to recall "I need to look to the left" for visual scanning task using large calendar. Max verbal cues and visual cues required to increase patients attention to calendar spaces in left visual field. Pt increased her accuracy with scanning to far left using head turn strategy but eventually needed SLP to place calendar cards in spaces in far left visual field. Pt demonstrated 90% accuracy for attention to spaces in right visual field and 35% accuracy with max visual and verbal cues for left visual field. Encouragement needed as task continued due to decreased attention and fatigue. Pt left upright in bed with call bell within reach and chair alarm on.      Pain Pain Assessment Pain Scale: 0-10 Pain Score: 0-No pain Faces Pain Scale: No hurt  Therapy/Group: Individual Therapy  Carlean Jews Kyannah Climer 09/04/2019, 10:35 AM

## 2019-09-04 NOTE — Progress Notes (Signed)
Occupational Therapy Session Note  Patient Details  Name: Caitlin Vasquez MRN: [id number] Date of Birth: [birth year]  Today's Date: 09/04/2019 OT Individual Time: 1315-1355 OT Individual Time Calculation (min): 40 min    Short Term Goals: Week 1:  OT Short Term Goal 1 (Week 1): Pt will maintain static sitting balance for 3 mins EOB with min assist in preparation for selfcare tasks. OT Short Term Goal 1 - Progress (Week 1): Not met OT Short Term Goal 2 (Week 1): Pt will complete UB bathing in supported sitting with no more than min assist for two consecutive sessions. OT Short Term Goal 2 - Progress (Week 1): Not met OT Short Term Goal 3 (Week 1): Pt will locate grooming and bathing items placed left of midline with no more than mod instructional cueing. OT Short Term Goal 3 - Progress (Week 1): Not met OT Short Term Goal 4 (Week 1): Pt will donn a pullover shirt with mod assist following hemi dressing techniques. OT Short Term Goal 4 - Progress (Week 1): Not met  Skilled Therapeutic Interventions/Progress Updates:    Pt resting in bed upon arrival.  OT intervention with focus on attending to objects on L, LUE NMR, sitting balance, and activity tolerance to increase independence with BADLs. Supine>sit EOB with mod A and min/mod a for sitting balance EOB. Pt requires max verbal cues to attend to objects on L and turn head to look at therapist. Pt LUE PROM WNL and active movement but frequently in synergistic pattern with elbow flexed across chest.  Pt fixated on wanting her son to bring in peach yogurt and small cookies. When attempting to reach for object, pt's LUE uncontrollably hit herself in nose. Pt returned to supine and assisted therapist in repositioning in bed. Pt remained in bed with all needs within reach and bed alarm activated.   Therapy Documentation Precautions:  Precautions Precautions: Fall, Other (comment) Precaution Comments: supplemental O2, L side inattention, L visual  field deficits, left side sensory deficits, left hemiparesis, pusher to the left Restrictions Weight Bearing Restrictions: No LLE Weight Bearing:  (no WB precautions) Pain: Pain Assessment Pain Scale: 0-10 Pain Score: 0-No pain Faces Pain Scale: No hurt   Therapy/Group: Individual Therapy  Leroy Libman 09/04/2019, 2:01 PM

## 2019-09-04 NOTE — Progress Notes (Signed)
[place] PHYSICAL MEDICINE & REHABILITATION PROGRESS NOTE   Subjective/Complaints:   No issues overnite  ROS: Denies CP,  N/V/D  Objective:   No results found. Recent Labs    09/02/19 0730  WBC 11.5*  HGB 11.7*  HCT 36.0  PLT 244   Recent Labs    09/02/19 0730  NA 143  K 4.2  CL 109  CO2 24  GLUCOSE 97  BUN 21  CREATININE 1.20*  CALCIUM 9.0    Intake/Output Summary (Last 24 hours) at 09/04/2019 0852 Last data filed at 09/03/2019 1901 Gross per 24 hour  Intake 240 ml  Output --  Net 240 ml     Physical Exam: Vital Signs Blood pressure (!) 149/55, pulse 68, temperature 98.8 F (37.1 C), resp. rate 20, weight 56.5 kg, SpO2 99 %.  General: No acute distress Mood and affect are appropriate Heart: Regular rate and rhythm no rubs murmurs or extra sounds Lungs: Clear to auscultation, breathing unlabored, no rales or wheezes Abdomen: Positive bowel sounds, soft nontender to palpation, nondistended Extremities: No clubbing, cyanosis, or edema Skin: No evidence of breakdown, no evidence of rash    Neurologic: Cranial nerves II through XII intact, motor strength is 5/5 in RIght and 4/5 left deltoid, bicep, tricep, grip, hip flexor, knee extensors, ankle dorsiflexor and plantar flexor Sensory exam absent LUE and LLE  Cerebellar exam normal finger to nose to finger as well as heel to shin in bilateral upper and lower extremities Musculoskeletal: Full range of motion in all 4 extremities. No joint swelling   Assessment/Plan: 1. Functional deficits secondary to cerebellar hemorrhage which require 3+ hours per day of interdisciplinary therapy in a comprehensive inpatient rehab setting.  Physiatrist is providing close team supervision and 24 hour management of active medical problems listed below.  Physiatrist and rehab team continue to assess barriers to discharge/monitor patient progress toward functional and medical goals  Care Tool:  Bathing    Body parts  bathed by patient: Left arm, Chest, Abdomen, Right upper leg, Left upper leg, Face, Right arm, Right lower leg   Body parts bathed by helper: Right lower leg, Left lower leg, Right arm, Buttocks, Front perineal area     Bathing assist Assist Level: Moderate Assistance - Patient 50 - 74%     Upper Body Dressing/Undressing Upper body dressing   What is the patient wearing?: Pull over shirt    Upper body assist Assist Level: Maximal Assistance - Patient 25 - 49%    Lower Body Dressing/Undressing Lower body dressing      What is the patient wearing?: Pants     Lower body assist Assist for lower body dressing: Maximal Assistance - Patient 25 - 49%     Toileting Toileting    Toileting assist Assist for toileting: 2 Helpers     Transfers Chair/bed transfer  Transfers assist  Chair/bed transfer activity did not occur: Safety/medical concerns  Chair/bed transfer assist level: Moderate Assistance - Patient 50 - 74%     Locomotion Ambulation   Ambulation assist   Ambulation activity did not occur: Safety/medical concerns          Walk 10 feet activity   Assist  Walk 10 feet activity did not occur: Safety/medical concerns        Walk 50 feet activity   Assist Walk 50 feet with 2 turns activity did not occur: Safety/medical concerns         Walk 150 feet activity   Assist Walk 150  feet activity did not occur: Safety/medical concerns         Walk 10 feet on uneven surface  activity   Assist Walk 10 feet on uneven surfaces activity did not occur: Safety/medical concerns         Wheelchair     Assist Will patient use wheelchair at discharge?: No (No Long term goals set by PT)             Wheelchair 50 feet with 2 turns activity    Assist            Wheelchair 150 feet activity     Assist          Blood pressure (!) 149/55, pulse 68, temperature 98.8 F (37.1 C), resp. rate 20, weight 56.5 kg, SpO2 99  %.  Medical Problem List and Plan: 1.  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 Formulized individual treatment plan based on medical history, underlying problem and comorbidities. 2.  Antithrombotics: -DVT/anticoagulation:  Mechanical: Sequential compression devices, below knee Bilateral lower extremities  Vascular ultrasound negative for DVT             -antiplatelet therapy: N/a due to bleed.  3. Pain Management: Back pain resolved with treatment of UTI. Discontinued hydrocodone and will use tramadol or tylenol prn for now.   Appears controlled on 7/25 4. Anxiety disorder/Mood:Neuropsych     limit sedating meds  -antipsychotic agents: N/a 5. Neuropsych: This patient is not fully capable of making decisions on her own behalf. 6. Skin/Wound Care:  Routine pressure relief measures.  7. Fluids/Electrolytes/Nutrition: Strict I/Os.   8. NSTEMI with fluid overload: Treated medically with Coreg, Lisinopril and Lipitor.  9. Severe COPD: Encourage pulmonary hygiene--continue flutter. Wean oxygen to off as able as has not use it for past year per son. Albuterol MDI prn.  Discussed with nursing to maintain oxygen sats in high 80s/low 90s, avoid 100%  Continues to require supplemental oxygen prn 10. E coli UTI: Has completed 7 day antibiotic course on 07/11.  11. HTN: SBP goal <160. Monitor BP --on Norvasc and HCTZ.  Vitals:   09/04/19 0523 09/04/19 0757  BP: (!) 147/54 (!) 149/55  Pulse: 61 68  Resp: 20   Temp: 98.8 F (37.1 C)   SpO2: 99%   fair control 7/28 12. Lethargy: improved, mainly CVA related  13. Urinary retention with incontinence: Continue monitor voiding with PVR checks/bladder scans 4-6 hours and I/O caht to keep  volumes <300 cc.   Flomax started  Some improvement 14. A fib: Not AC candidate due to hemorrhage.  Rate controlled. 15.  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  Advance diet as tolerated 16. Tachypnea:   Related to bronchospasm and atelectasis, maintaining sats at rest on RA but needs O2 2 L to maintain above 90% during therapy      IS  10 puff q 1 hr- pt need reminders  Improved 17.  Bowel incont  Remains incontinent on 7/24 18.  Elevated BUN/creatinine  Holding IVF, repeat BUN Cr normalized  19.  Normal CBG, no hx DM will d/c CBG  LOS: 16 days A FACE TO FACE EVALUATION WAS PERFORMED  Charlett Blake 09/04/2019, 8:52 AM

## 2019-09-04 NOTE — Plan of Care (Signed)
  Problem: Consults Goal: RH STROKE PATIENT EDUCATION Description: See Patient Education module for education specifics  Outcome: Progressing   Problem: RH BOWEL ELIMINATION Goal: RH STG MANAGE BOWEL WITH ASSISTANCE Description: STG Manage Bowel with mod Assistance. Outcome: Progressing Goal: RH STG MANAGE BOWEL W/MEDICATION W/ASSISTANCE Description: STG Manage Bowel with Medication with mod I Assistance. Outcome: Progressing   Problem: RH BLADDER ELIMINATION Goal: RH STG MANAGE BLADDER WITH ASSISTANCE Description: STG Manage Bladder With mod Assistance Outcome: Progressing   Problem: RH SKIN INTEGRITY Goal: RH STG SKIN FREE OF INFECTION/BREAKDOWN Description: No additional breakdown will occur while iin rehab with mod assistance  Outcome: Progressing Goal: RH STG MAINTAIN SKIN INTEGRITY WITH ASSISTANCE Description: STG Maintain Skin Integrity With mod Assistance. Outcome: Progressing   Problem: RH SAFETY Goal: RH STG ADHERE TO SAFETY PRECAUTIONS W/ASSISTANCE/DEVICE Description: STG Adhere to Safety Precautions With mod Assistance/Device. Outcome: Progressing   Problem: RH PAIN MANAGEMENT Goal: RH STG PAIN MANAGED AT OR BELOW PT'S PAIN GOAL Description: Pt will report pain of less than 4 with min assistance Outcome: Progressing   Problem: RH KNOWLEDGE DEFICIT Goal: RH STG INCREASE KNOWLEDGE OF HYPERTENSION Description: Family will review stroke book and be able to identify factors related to hypertension with min assist Outcome: Progressing Goal: RH STG INCREASE KNOWLEDGE OF STROKE PROPHYLAXIS Description: Family will be able to identify stroke prophylaxis with min assist Outcome: Progressing   Problem: RH KNOWLEDGE DEFICIT Goal: RH STG INCREASE KNOWLEGDE OF HYPERLIPIDEMIA Description: Family will be able to identify factors related to HLD  and secondary stroke, dietary restrictions and medication management for with min assist  Outcome: Progressing   Problem: RH  Vision Goal: RH LTG Vision (Specify) Outcome: Progressing   

## 2019-09-05 ENCOUNTER — Inpatient Hospital Stay (HOSPITAL_COMMUNITY): Payer: Medicare Other

## 2019-09-05 ENCOUNTER — Inpatient Hospital Stay (HOSPITAL_COMMUNITY): Payer: Medicare Other | Admitting: Occupational Therapy

## 2019-09-05 ENCOUNTER — Inpatient Hospital Stay (HOSPITAL_COMMUNITY): Payer: Medicare Other | Admitting: Physical Therapy

## 2019-09-05 DIAGNOSIS — I63331 Cerebral infarction due to thrombosis of right posterior cerebral artery: Secondary | ICD-10-CM

## 2019-09-05 NOTE — Progress Notes (Signed)
Occupational Therapy Weekly Progress Note  Patient Details  Name: Caitlin Vasquez MRN: [id number] Date of Birth: August 17, 1945  Beginning of progress report period: August 26, 2019 End of progress report period: September 05, 2019  Today's Date: 09/05/2019 OT Individual Time: 0623-7628 OT Individual Time Calculation (min): 45 min    Patient continues to work towards short term and long term goals.  Pt currently continues to make slow progress towards LTG. Pt will require a consisterte amount of assistance at home. Pt can perform bed mobility with mod a to come to EOB. Pt requires min to mod A for sitting balance at the EOB. Pt in impacted by her low endurance/ activity tolerate and benefits from frequent rest breaks. Pt can transfer with mod to max A stand pivot with extra time. Pt requires mod to max A for UB BADLs and max to total A for LB BADLs. Pt requires total A for toileting   Patient continues to demonstrate the following deficits: muscle weakness, decreased cardiorespiratoy endurance and decreased oxygen support, impaired timing and sequencing, abnormal tone, unbalanced muscle activation, motor apraxia and decreased coordination, decreased midline orientation and decreased attention to left, decreased initiation, decreased awareness, decreased problem solving and decreased safety awareness and decreased sitting balance, decreased standing balance, decreased postural control, hemiplegia, decreased balance strategies and difficulty maintaining precautions and therefore will continue to benefit from skilled OT intervention to enhance overall performance with BADL and Reduce care partner burden.  Patient progressing toward long term goals..  Continue plan of care.  OT Short Term Goals Week 2:  OT Short Term Goal 1 (Week 2): Pt will maintain static sitting balance for 3 mins EOB with min assist in preparation for selfcare tasks. OT Short Term Goal 2 (Week 2): Pt will complete UB bathing in supported  sitting with no more than min assist for two consecutive sessions. OT Short Term Goal 2 - Progress (Week 2): Progressing toward goal OT Short Term Goal 3 (Week 2): Pt will locate grooming and bathing items placed left of midline with no more than mod instructional cueing. OT Short Term Goal 3 - Progress (Week 2): Progressing toward goal OT Short Term Goal 4 (Week 2): Pt will donn a pullover shirt with mod assist following hemi dressing techniques. OT Short Term Goal 4 - Progress (Week 2): Progressing toward goal Week 3:  OT Short Term Goal 1 (Week 3): STG=LTG OT Short Term Goal 2 (Week 3): Family education  Skilled Therapeutic Interventions/Progress Updates:    1:1 Pt in bed when arrived. Pt required mod A to come to EOB. Pt required min A to mod A for sitting balance at EOB. Pt required frequent rest breaks. Pt required total A for for attention to Left UE during dressing and required hand over hand A to complete UB dressing with max A. Pt required total A to thread pants with pt's ability to kick LEs out to assist OT. Pt reported need to void of BM. Mod to max A stand pivot over to the [facility] with extra time. Pt required pillows to achieve and maintain sitting at midline. Pt required total A for toileting. Pt's w/c switched out for better positioning (head and back). Mod A stand pivot transfer into the tilt in space w/c. Left resting in the w/c in tilted back.  Therapy Documentation Precautions:  Precautions Precautions: Fall, Other (comment) Precaution Comments: supplemental O2, L side inattention, L visual field deficits, left side sensory deficits, left hemiparesis, pusher to the left Restrictions  Weight Bearing Restrictions: No LLE Weight Bearing:  (no WB precautions) General:   Vital Signs: Therapy Vitals Pulse Rate: 78 Resp: 14 BP: (!) 117/43 Patient Position (if appropriate): Lying Oxygen Therapy SpO2: 98 % O2 Device: Room Air Pain:   ADL: ADL Eating: Minimal  assistance Where Assessed-Eating: Bed level Grooming: Minimal assistance Where Assessed-Grooming: Bed level Upper Body Bathing: Maximal assistance Where Assessed-Upper Body Bathing: Edge of bed Lower Body Bathing: Dependent Where Assessed-Lower Body Bathing: Edge of bed, Bed level Upper Body Dressing: Dependent Where Assessed-Upper Body Dressing: Edge of bed Lower Body Dressing: Dependent Where Assessed-Lower Body Dressing: Edge of bed Toileting: Dependent Where Assessed-Toileting: Bedside Commode Toilet Transfer: Dependent Toilet Transfer Method: Ambulance person: Tourist information centre manager    Praxis   Exercises:   Other Treatments:     Therapy/Group: Individual Therapy  Roney Mans [facility] 09/05/2019, 3:23 PM

## 2019-09-05 NOTE — Plan of Care (Signed)
  Problem: RH BOWEL ELIMINATION Goal: RH STG MANAGE BOWEL WITH ASSISTANCE Description: STG Manage Bowel with mod Assistance. Outcome: Not Progressing;   Problem: RH BLADDER ELIMINATION Goal: RH STG MANAGE BLADDER WITH ASSISTANCE Description: STG Manage Bladder With mod Assistance Outcome: Not Progressing

## 2019-09-05 NOTE — Progress Notes (Signed)
Physical Therapy Weekly Progress Note  Patient Details  Name: Caitlin Vasquez MRN: [id number] Date of Birth: 09/17/1945  Beginning of progress report period: August 27, 2019 End of progress report period: September 05, 2019  Today's Date: 09/05/2019 PT Individual Time: 1005-1100 PT Individual Time Calculation (min): 55 min   Patient has met 3 of 4 short term goals.  Caitlin Vasquez is progressing well with therapy despite impairments in L attention, midline orientation, pusher tendencies, absent L hemibody sensation, impaired coordination, L hemiparesis, and significantly impaired cardiopulmonary endurance. She is performing supine<>sit with min assist (no longer demoing significant L anterior trunk lean once upright), maintains static sitting with close supervision and dynamic sitting with mod assist, stand pivot transfers with mod assist, sit<>stands with min/mod assist (able to tolerate standing for up to 45seconds) and has progressed to gait training via taking ~3 steps at hallway rail.   Patient continues to demonstrate the following deficits muscle weakness and muscle joint tightness, decreased cardiorespiratoy endurance and decreased oxygen support, impaired timing and sequencing, abnormal tone, unbalanced muscle activation, motor apraxia, decreased coordination and decreased motor planning, decreased attention to left, decreased attention, decreased awareness, decreased problem solving, decreased safety awareness, decreased memory and delayed processing and decreased sitting balance, decreased standing balance, decreased postural control and decreased balance strategies and therefore will continue to benefit from skilled PT intervention to increase functional independence with mobility.  Patient progressing toward long term goals. May need to adjust goals depending on continued progress towards ambulation - will continue to assess. Continue plan of care.  PT Short Term Goals Week 2:  PT Short Term  Goal 1 (Week 2): Pt will perform supine<>sit with mod assist PT Short Term Goal 1 - Progress (Week 2): Met PT Short Term Goal 2 (Week 2): Pt will perform bed<>chair transfers with max assist of 1 consistently PT Short Term Goal 2 - Progress (Week 2): Met PT Short Term Goal 3 (Week 2): Pt will perform sit<>stands with max assist of 1 consistently PT Short Term Goal 3 - Progress (Week 2): Met PT Short Term Goal 4 (Week 2): Pt will tolerate standing position using LRAD at least 30 seconds with max assist of 1 PT Short Term Goal 4 - Progress (Week 2): Progressing toward goal (not yet performing this consistently) Week 3:  PT Short Term Goal 1 (Week 3): = to LTGs based on ELOS  Skilled Therapeutic Interventions/Progress Updates:  Ambulation/gait training;Community reintegration;DME/adaptive equipment instruction;Neuromuscular re-education;Psychosocial support;Stair training;UE/LE Strength taining/ROM;Wheelchair propulsion/positioning;Balance/vestibular training;Discharge planning;Pain management;Functional electrical stimulation;Skin care/wound management;Therapeutic Activities;UE/LE Coordination activities;Cognitive remediation/compensation;Disease management/prevention;Functional mobility training;Patient/family education;Splinting/orthotics;Therapeutic Exercise;Visual/perceptual remediation/compensation   Pt received sitting in TIS w/c and upon entering room pt requesting for assistance back to bed but with education on plans for session pt agreeable to participate. Corene Cornea, [employer] present for custom wheelchair consultation with discussion on the following recommendations for patient:  - roho cushion due to lack of sensation and high risk of pressure wound - 4inch contoured back for trunk alignment and to accommodate bony prominences to provide pressure relief - tilt feature to provide pressure relief  - reclining feature to promote increased upright activity tolerance and adjustability for improved  respiratory function - waterfall arm rests for increased skin protection due to lack of L UE sensation - regular leg rests  Pt in agreement with these recommendations and request we also discuss it with her son - planning to do this in the upcoming family education session on Monday.   Pt received and maintained on  2L of O2 via nasal cannula during session - unable to get accurate SpO2 reading on dynamap despite providing hand warmer - provided rest breaks when pt noted to have increase in labored breathing or wheezing.  Transported to/from gym in w/c for time management and energy conservation. L stand pivot w/c>EOM with mod assist of 1 for lifting, balance, and blocking L knee buckle. Sit>supine for rest break with min assist and max cuing for L LE flexion to place it on mat. Supine>sit with min assist for trunk upright and mod/max cuing for sequencing to increase pt independent - pt no longer having severe L anterior trunk flexion once coming to sitting. Sitting EOM trunk control/midline orientation and L attention tasks to locate and grasp items from L side using R hand (attempted to grasp items with L hand but pt unable to sustain grasp due to inattention and shape of object) - close supervision and intermittent min assist for balance. Sit<>stands x3 to/from EOM with R UE supported on chair and min assist of 1 for lifting into standing and blocking L knee buckle but pt unable to tolerate standing >10 seconds with cuing to visually scan for item overhead to promote increased upright posture due to sustained trunk/hip flexion. R stand pivot EOM>w/c mod assist of 1 for pivoting and blocking L knee. Transported back to room. R stand pivot w/c>EOB using bedrails support mod assist. Sit>supine min assist again for L LE management due to impaired sensation and impaired coordination with max verbal cuing to flex the LE. Pt left supine in bed with needs in reach, lines intact, padding placed on L side to protect  arm, and bed alarm on.  Therapy Documentation Precautions:  Precautions Precautions: Fall, Other (comment) Precaution Comments: supplemental O2, L side inattention, L visual field deficits, left side sensory deficits, left hemiparesis, pusher to the left Restrictions Weight Bearing Restrictions: No LLE Weight Bearing:  (no WB precautions)  Pain: No reports of pain throughout session.  Therapy/Group: Individual Therapy  Tawana Scale , PT, DPT, CSRS  09/05/2019, 8:01 AM

## 2019-09-05 NOTE — Progress Notes (Signed)
Physical Therapy Session Note  Patient Details  Name: Caitlin Vasquez MRN: [id number] Date of Birth: 09-05-1945  Today's Date: 09/05/2019 PT Individual Time: 1500-1530 PT Individual Time Calculation (min): 30 min   Short Term Goals: Week 2:  PT Short Term Goal 1 (Week 2): Pt will perform supine<>sit with mod assist PT Short Term Goal 2 (Week 2): Pt will perform bed<>chair transfers with max assist of 1 consistently PT Short Term Goal 3 (Week 2): Pt will perform sit<>stands with max assist of 1 consistently PT Short Term Goal 4 (Week 2): Pt will tolerate standing position using LRAD at least 30 seconds with max assist of 1  Skilled Therapeutic Interventions/Progress Updates:     Patient in bed with O2 doffed upon PT arrival. Patient alert and reporting increased fatigue this afternoon. Patient denied pain during session. SPO2 98% on RA and 100% on 2L/min O2 throughout session, RN made aware. Patient agreeable to working on sitting balance and breathing exercises this session.   Therapeutic Activity: Bed Mobility: Patient performed supine to/from sit with mod A. Provided verbal cues for performing supine<>side-lying<>sitting, setting bottom elbow and pushing up to her elbow then her hand to come to sitting.Patient performed scooting up in bed with total A of 1 person at end of session.  Neuromuscular Re-ed: Patient performed the following sitting balance and LE motor control activities: -sitting balance x5 min, progressed from sitting with R arm extended and abduction for support to B UE in her lap or holding a cookie from her lunch tray, maintained min A-supervision for sitting balance throughout, required cues for erect posture and visual target of PT's shoulder on the R for her to find midline, occupied R UE with feeding herself a cookie throughout to reduce pushing -bridging x6 with manual facilitation for R foot and kneed -hip abduction/adduction in hook-lying x8 with 5 sec hold in  adducted position for improved motor control in hook-lying with increased tactile cues to R LE for adduction  Patient attempted to use incentive spirometer x10, unable to follow cues for proper technique throughout. Then performed use of flutter valve x10, able to follow cues and perform with correct technique after first trial. Educated on use of flutter valve every hour she is awake if able to improve expiration of lung secretions due to increased time in lying or sitting. Patient stated understanding.   Patient in bed at end of session with breaks locked, bed alarm set, and all needs within reach.    Therapy Documentation Precautions:  Precautions Precautions: Fall, Other (comment) Precaution Comments: supplemental O2, L side inattention, L visual field deficits, left side sensory deficits, left hemiparesis, pusher to the left Restrictions Weight Bearing Restrictions: No LLE Weight Bearing:  (no WB precautions)    Therapy/Group: Individual Therapy  Placido Hangartner L Anntonette Madewell PT, DPT  09/05/2019, 5:01 PM

## 2019-09-05 NOTE — Progress Notes (Signed)
[place] PHYSICAL MEDICINE & REHABILITATION PROGRESS NOTE   Subjective/Complaints:   Patient somnolent this morning, ate well for breakfast ROS: Denies CP,  N/V/D  Objective:   No results found. No results for input(s): WBC, HGB, HCT, PLT in the last 72 hours. No results for input(s): NA, K, CL, CO2, GLUCOSE, BUN, CREATININE, CALCIUM in the last 72 hours.  Intake/Output Summary (Last 24 hours) at 09/05/2019 0917 Last data filed at 09/05/2019 0847 Gross per 24 hour  Intake 585 ml  Output 0 ml  Net 585 ml     Physical Exam: Vital Signs Blood pressure (!) 147/61, pulse 76, temperature 97.8 F (36.6 C), resp. rate 16, weight 54.4 kg, SpO2 99 %.   General: No acute distress Mood and affect are appropriate Heart: Regular rate and rhythm no rubs murmurs or extra sounds Lungs: Clear to auscultation, breathing unlabored, no rales or wheezes Abdomen: Positive bowel sounds, soft nontender to palpation, nondistended Extremities: No clubbing, cyanosis, or edema Skin: No evidence of breakdown, no evidence of rash  Neurologic: Cranial nerves II through XII intact, motor strength is 5/5 in RIght and 4/5 left deltoid, bicep, tricep, grip, hip flexor, knee extensors, ankle dorsiflexor and plantar flexor Sensory exam absent LUE and LLE  Cerebellar exam normal finger to nose to finger as well as heel to shin in bilateral upper and lower extremities Musculoskeletal: Full range of motion in all 4 extremities. No joint swelling   Assessment/Plan: 1. Functional deficits secondary to cerebellar hemorrhage which require 3+ hours per day of interdisciplinary therapy in a comprehensive inpatient rehab setting.  Physiatrist is providing close team supervision and 24 hour management of active medical problems listed below.  Physiatrist and rehab team continue to assess barriers to discharge/monitor patient progress toward functional and medical goals  Care Tool:  Bathing    Body parts bathed  by patient: Left arm, Chest, Abdomen, Right upper leg, Left upper leg, Face, Right arm, Right lower leg   Body parts bathed by helper: Right lower leg, Left lower leg, Right arm, Buttocks, Front perineal area     Bathing assist Assist Level: Moderate Assistance - Patient 50 - 74%     Upper Body Dressing/Undressing Upper body dressing   What is the patient wearing?: Pull over shirt    Upper body assist Assist Level: Maximal Assistance - Patient 25 - 49%    Lower Body Dressing/Undressing Lower body dressing      What is the patient wearing?: Pants     Lower body assist Assist for lower body dressing: Maximal Assistance - Patient 25 - 49%     Toileting Toileting    Toileting assist Assist for toileting: 2 Helpers     Transfers Chair/bed transfer  Transfers assist  Chair/bed transfer activity did not occur: Safety/medical concerns  Chair/bed transfer assist level: Moderate Assistance - Patient 50 - 74%     Locomotion Ambulation   Ambulation assist   Ambulation activity did not occur: Safety/medical concerns          Walk 10 feet activity   Assist  Walk 10 feet activity did not occur: Safety/medical concerns        Walk 50 feet activity   Assist Walk 50 feet with 2 turns activity did not occur: Safety/medical concerns         Walk 150 feet activity   Assist Walk 150 feet activity did not occur: Safety/medical concerns         Walk 10 feet on uneven  surface  activity   Assist Walk 10 feet on uneven surfaces activity did not occur: Safety/medical concerns         Wheelchair     Assist Will patient use wheelchair at discharge?: No (No Long term goals set by PT)             Wheelchair 50 feet with 2 turns activity    Assist            Wheelchair 150 feet activity     Assist          Blood pressure (!) 147/61, pulse 76, temperature 97.8 F (36.6 C), resp. rate 16, weight 54.4 kg, SpO2 99 %.  Medical  Problem List and Plan: 1.  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  Antithrombotics: -DVT/anticoagulation:  Mechanical: Sequential compression devices, below knee Bilateral lower extremities  Vascular ultrasound negative for DVT             -antiplatelet therapy: N/a due to bleed.  3. Pain Management: Back pain resolved with treatment of UTI. Discontinued hydrocodone and will use tramadol or tylenol prn for now.   Appears controlled on 7/25 4. Anxiety disorder/Mood:Neuropsych     limit sedating meds  -antipsychotic agents: N/a 5. Neuropsych: This patient is not fully capable of making decisions on her own behalf. 6. Skin/Wound Care:  Routine pressure relief measures.  7. Fluids/Electrolytes/Nutrition: Strict I/Os. Patient ate 75% of breakfast this morning 8. NSTEMI with fluid overload: Treated medically with Coreg, Lisinopril and Lipitor.  9. Severe COPD: Encourage pulmonary hygiene--continue flutter. Wean oxygen to off as able as has not use it for past year per son. Albuterol MDI prn.  Discussed with nursing to maintain oxygen sats in high 80s/low 90s, avoid 100%  Continues to require supplemental oxygen prn 10. E coli UTI: Has completed 7 day antibiotic course on 07/11.  11. HTN: SBP goal <160. Monitor BP --on Norvasc and HCTZ.  Vitals:   09/05/19 0326 09/05/19 0912  BP: (!) 147/61   Pulse: 76   Resp: 16   Temp: 97.8 F (36.6 C)   SpO2: 99% 99%  fair control 7/29 12. Lethargy: improved, mainly CVA related  13. Urinary retention with incontinence: Continue monitor voiding with PVR checks/bladder scans 4-6 hours and I/O caht to keep volumes <300 cc.   Flomax started no catheterization for over 2 days  14. A fib: Not AC candidate due to hemorrhage.  Rate controlled. 15.  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  Advance diet as tolerated 16.  Tachypnea:   Related to bronchospasm and atelectasis, maintaining sats at rest on RA but needs O2 2 L to maintain above 90% during therapy      IS  10 puff q 1 hr- pt need reminders  Improved 17.  Bowel incont  Remains incontinent on 7/24 18.  Elevated BUN/creatinine  Holding IVF, repeat BUN Cr normalized  19.  Normal CBG, no hx DM will d/c CBG  LOS: 17 days A FACE TO FACE EVALUATION WAS PERFORMED  Erick Colace 09/05/2019, 9:17 AM

## 2019-09-06 ENCOUNTER — Inpatient Hospital Stay (HOSPITAL_COMMUNITY): Payer: Medicare Other | Admitting: Physical Therapy

## 2019-09-06 ENCOUNTER — Inpatient Hospital Stay (HOSPITAL_COMMUNITY): Payer: Medicare Other

## 2019-09-06 NOTE — Progress Notes (Signed)
Physical Therapy Session Note  Patient Details  Name: Caitlin Vasquez MRN: [id number] Date of Birth: 04-Dec-1945  Today's Date: 09/06/2019 PT Individual Time: [phone] and [id number] PT Individual Time Calculation (min): 44 min  And 19 min and  Today's Date: 09/06/2019 PT Missed Time: 11 Minutes Missed Time Reason: Patient fatigue  Short Term Goals: Week 3:  PT Short Term Goal 1 (Week 3): = to LTGs based on ELOS  Skilled Therapeutic Interventions/Progress Updates:    Session 1: Pt received supine in bed awake but much more flat and quiet today reporting fatigue. With encouragement agreeable to therapy session. RN notified for breathing treatment. Received and maintained on 2L of O2 via nasal cannula. Supine>sitting R EOB, using bedrails, with min assist for L LE management and trunk upright - verbal/visual cuing for sequencing to increase pt independence with task - requires mod/max assist via bed pad to scoot hips to EOB. Tolerated sitting EOB ~78minutes with min assist for trunk support during breathing treatment. Sit>supine with min assist for L hemibody management and rolling onto back with cuing for sequencing to increase pt independent. Returned to sitting EOB and completed UB dressing with total assist and max assist for sitting balance due to L lean/LOB. Sitting EOB with close supervision (cuing for R hand positioning and to correct minor L lean when it occurred) donned pants and shoes total assist. Sit<>stand EOB<>R UE support on chair armrest with min/mod assist for lifting and max assist for standing balance while +2 assist pulled up pants over hips total assist. Pt demos significant fatigue today requiring increased assistance for mobility. Returned to supine as described above. Pt requesting for assistance to eat apple sauce - elevated HOB >30degrees - provided full supervision for eating while focusing on L attention - no coughing or signs of aspiration. Pt left supine in bed with needs in  reach, HOB elevated, and bed alarm on.  Session 2: Pt received supine in bed in a deep sleep requiring increased verbal stimulation to awaken. Received and maintained on 2L of O2 via nasal cannula. Pt noted to be incontinent of bladder. Rolling R/L in bed with min/mod assist due to lethargy with cuing for sequencing to increase pt participation - total assist LB clothing management and peri-care. Pt requests a snack. Pt declines sitting EOB or transfer to w/c requesting only assistance to eat. Therapist spoke with pt's son Sharia Reeve regarding custom w/c recommendations, follow-up HHPT recommendations, DME recommendations including hospital bed, and need for ramped entrance into home. Therapist updated pt on this conversation. Pt continues to decline any further mobility therefore left supine in bed with needs in reach and in care of NT for full supervision with snack. Missed 11 minutes of skilled physical therapy.  Therapy Documentation Precautions:  Precautions Precautions: Fall, Other (comment) Precaution Comments: supplemental O2, L side inattention, L visual field deficits, left side sensory deficits, left hemiparesis, pusher to the left Restrictions Weight Bearing Restrictions: No LLE Weight Bearing:  (no WB precautions)  Pain:   Session 1: No reports of pain throughout session.  Session 2: No reports of pain throughout session.   Therapy/Group: Individual Therapy  Ginny Forth , PT, DPT, CSRS  09/06/2019, 7:47 AM

## 2019-09-06 NOTE — Progress Notes (Signed)
[facility] PHYSICAL MEDICINE & REHABILITATION PROGRESS NOTE   Subjective/Complaints:  Ate breakfast, no breathing issues per RN  ROS: Denies CP,  N/V/D  Objective:   No results found. No results for input(s): WBC, HGB, HCT, PLT in the last 72 hours. No results for input(s): NA, K, CL, CO2, GLUCOSE, BUN, CREATININE, CALCIUM in the last 72 hours.  Intake/Output Summary (Last 24 hours) at 09/06/2019 0809 Last data filed at 09/05/2019 1700 Gross per 24 hour  Intake 360 ml  Output --  Net 360 ml     Physical Exam: Vital Signs Blood pressure (!) 149/63, pulse 72, temperature 97.7 F (36.5 C), temperature source Oral, resp. rate 18, weight 54.4 kg, SpO2 96 %.  General: No acute distress Mood and affect are appropriate Heart: Regular rate and rhythm no rubs murmurs or extra sounds Lungs: Clear to auscultation, breathing unlabored, no rales or wheezes Abdomen: Positive bowel sounds, soft nontender to palpation, nondistended Extremities: No clubbing, cyanosis, or edema Skin: No evidence of breakdown, no evidence of rash    Neurologic: Cranial nerves II through XII intact, motor strength is 5/5 in RIght and 4/5 left deltoid, bicep, tricep, grip, hip flexor, knee extensors, ankle dorsiflexor and plantar flexor Sensory exam absent LUE and LLE  Cerebellar exam normal finger to nose to finger as well as heel to shin in bilateral upper and lower extremities Musculoskeletal: Full range of motion in all 4 extremities. No joint swelling   Assessment/Plan: 1. Functional deficits secondary to cerebellar hemorrhage which require 3+ hours per day of interdisciplinary therapy in a comprehensive inpatient rehab setting.  Physiatrist is providing close team supervision and 24 hour management of active medical problems listed below.  Physiatrist and rehab team continue to assess barriers to discharge/monitor patient progress toward functional and medical goals  Care Tool:  Bathing    Body  parts bathed by patient: Left arm, Chest, Abdomen, Right upper leg, Left upper leg, Face, Right arm, Right lower leg   Body parts bathed by helper: Right lower leg, Left lower leg, Right arm, Buttocks, Front perineal area     Bathing assist Assist Level: Moderate Assistance - Patient 50 - 74%     Upper Body Dressing/Undressing Upper body dressing   What is the patient wearing?: Pull over shirt    Upper body assist Assist Level: Maximal Assistance - Patient 25 - 49%    Lower Body Dressing/Undressing Lower body dressing      What is the patient wearing?: Pants, Underwear/pull up     Lower body assist Assist for lower body dressing: Total Assistance - Patient < 25%     Toileting Toileting    Toileting assist Assist for toileting: 2 Helpers     Transfers Chair/bed transfer  Transfers assist  Chair/bed transfer activity did not occur: Safety/medical concerns  Chair/bed transfer assist level: Moderate Assistance - Patient 50 - 74%     Locomotion Ambulation   Ambulation assist   Ambulation activity did not occur: Safety/medical concerns          Walk 10 feet activity   Assist  Walk 10 feet activity did not occur: Safety/medical concerns        Walk 50 feet activity   Assist Walk 50 feet with 2 turns activity did not occur: Safety/medical concerns         Walk 150 feet activity   Assist Walk 150 feet activity did not occur: Safety/medical concerns         Walk 10  feet on uneven surface  activity   Assist Walk 10 feet on uneven surfaces activity did not occur: Safety/medical concerns         Wheelchair     Assist Will patient use wheelchair at discharge?: No (No Long term goals set by PT)             Wheelchair 50 feet with 2 turns activity    Assist            Wheelchair 150 feet activity     Assist          Blood pressure (!) 149/63, pulse 72, temperature 97.7 F (36.5 C), temperature source Oral,  resp. rate 18, weight 54.4 kg, SpO2 96 %.  Medical Problem List and Plan: 1.  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  Antithrombotics: -DVT/anticoagulation:  Mechanical: Sequential compression devices, below knee Bilateral lower extremities  Vascular ultrasound negative for DVT             -antiplatelet therapy: N/a due to bleed.  3. Pain Management: Back pain resolved with treatment of UTI. Discontinued hydrocodone and will use tramadol or tylenol prn for now.   Appears controlled on 7/25 4. Anxiety disorder/Mood:Neuropsych     limit sedating meds  -antipsychotic agents: N/a 5. Neuropsych: This patient is not fully capable of making decisions on her own behalf. 6. Skin/Wound Care:  Routine pressure relief measures.  7. Fluids/Electrolytes/Nutrition: Strict I/Os. Meal intake 50-75% 8. NSTEMI with fluid overload: Treated medically with Coreg, Lisinopril and Lipitor.  9. Severe COPD: Encourage pulmonary hygiene--continue flutter. Wean oxygen to off as able as has not use it for past year per son. Albuterol MDI prn.  Discussed with nursing to maintain oxygen sats in high 80s/low 90s, avoid 100%  Continues to require supplemental oxygen prn 10. E coli UTI: Has completed 7 day antibiotic course on 07/11.  11. HTN: SBP goal <160. Monitor BP --on Norvasc and HCTZ.  Vitals:   09/05/19 1956 09/06/19 0340  BP: (!) 122/51 (!) 149/63  Pulse: 79 72  Resp: 20 18  Temp: 98.1 F (36.7 C) 97.7 F (36.5 C)  SpO2: 100% 96%  fair control 7/30 12. Lethargy: improved, mainly CVA related  13. Urinary retention with incontinence: Continue monitor voiding with PVR checks/bladder scans 4-6 hours and I/O caht to keep volumes <300 cc.   Flomax started no catheterization for over 2 days  14. A fib: Not AC candidate due to hemorrhage.  Rate controlled. 15.  Post stroke  dysphagia: On dysphagia 3 nectars.  Advance diet as tolerated 16. Tachypnea:   Related to bronchospasm and atelectasis, maintaining sats at rest on RA but needs O2 2 L to maintain above 90% during therapy      IS  10 puff q 1 hr- pt need reminders  Improved 17.  Bowel incont  Remains incontinent on 7/24 18.  Elevated BUN/creatinine  Holding IVF, repeat BUN Cr normalized  19.  Normal CBG, no hx DM will d/c CBG  LOS: 18 days A FACE TO FACE EVALUATION WAS PERFORMED  Erick Colace 09/06/2019, 8:09 AM

## 2019-09-06 NOTE — Progress Notes (Signed)
Occupational Therapy Session Note  Patient Details  Name: Caitlin Vasquez MRN: [id number] Date of Birth: 1945-12-21  Today's Date: 09/06/2019 OT Individual Time: 1330-1415 OT Individual Time Calculation (min): 45 min    Short Term Goals: Week 1:  OT Short Term Goal 1 (Week 1): Pt will maintain static sitting balance for 3 mins EOB with min assist in preparation for selfcare tasks. OT Short Term Goal 1 - Progress (Week 1): Not met OT Short Term Goal 2 (Week 1): Pt will complete UB bathing in supported sitting with no more than min assist for two consecutive sessions. OT Short Term Goal 2 - Progress (Week 1): Not met OT Short Term Goal 3 (Week 1): Pt will locate grooming and bathing items placed left of midline with no more than mod instructional cueing. OT Short Term Goal 3 - Progress (Week 1): Not met OT Short Term Goal 4 (Week 1): Pt will donn a pullover shirt with mod assist following hemi dressing techniques. OT Short Term Goal 4 - Progress (Week 1): Not met  Skilled Therapeutic Interventions/Progress Updates:    Pt received reporting need to void with NT present. Completed supine>sit with max A and squat pivot transfer bed<>BSC with max A and 2nd person present for safety. OT provided VC's and min-mod assist for posture while sitting on BSC due to L lean. Pt unable to void therefore transitioned back to bed donning brief and pants from bed level with total A and focusing on bed mobility. Transitioned to sitting upright in bed at 45 degrees for lunch. OT strategically placed food on left side to promote attention to L with pt required VC's to locate. Pt followed safe swallowing precautions with min cues. Encouraged eating proteins/carbs for recovery, however pt declined. Pt left remaining upright in bed with all needs in reach.   Therapy Documentation Precautions:  Precautions Precautions: Fall, Other (comment) Precaution Comments: supplemental O2, L side inattention, L visual field  deficits, left side sensory deficits, left hemiparesis, pusher to the left Restrictions Weight Bearing Restrictions: No LLE Weight Bearing:  (no WB precautions) General:   Vital Signs: Therapy Vitals Temp: (!) 97.5 F (36.4 C) Pulse Rate: 95 Resp: 18 BP: (!) 121/47 Patient Position (if appropriate): Lying Oxygen Therapy SpO2: (!) 83 % O2 Device: Room Air Pain:   ADL: ADL Eating: Minimal assistance Where Assessed-Eating: Bed level Grooming: Minimal assistance Where Assessed-Grooming: Bed level Upper Body Bathing: Maximal assistance Where Assessed-Upper Body Bathing: Edge of bed Lower Body Bathing: Dependent Where Assessed-Lower Body Bathing: Edge of bed, Bed level Upper Body Dressing: Dependent Where Assessed-Upper Body Dressing: Edge of bed Lower Body Dressing: Dependent Where Assessed-Lower Body Dressing: Edge of bed Toileting: Dependent Where Assessed-Toileting: Bedside Commode Toilet Transfer: Dependent Toilet Transfer Method: Engineer, water: Art gallery manager    Praxis   Exercises:   Other Treatments:     Therapy/Group: Individual Therapy  Duayne Cal 09/06/2019, 2:22 PM

## 2019-09-06 NOTE — Progress Notes (Signed)
Pt refusing the  In/out cath. Pt last void was 6+ hours ago, bladder scan for 193 . Pt was educated on the importance of having the procedure. Pt states she will be able to void in a little bit.

## 2019-09-06 NOTE — Plan of Care (Signed)
  Problem: Consults Goal: RH STROKE PATIENT EDUCATION Description: See Patient Education module for education specifics  Outcome: Progressing   Problem: RH BOWEL ELIMINATION Goal: RH STG MANAGE BOWEL WITH ASSISTANCE Description: STG Manage Bowel with mod Assistance. Outcome: Progressing Goal: RH STG MANAGE BOWEL W/MEDICATION W/ASSISTANCE Description: STG Manage Bowel with Medication with mod I Assistance. Outcome: Progressing   Problem: RH BLADDER ELIMINATION Goal: RH STG MANAGE BLADDER WITH ASSISTANCE Description: STG Manage Bladder With mod Assistance Outcome: Progressing   Problem: RH SKIN INTEGRITY Goal: RH STG SKIN FREE OF INFECTION/BREAKDOWN Description: No additional breakdown will occur while iin rehab with mod assistance  Outcome: Progressing Goal: RH STG MAINTAIN SKIN INTEGRITY WITH ASSISTANCE Description: STG Maintain Skin Integrity With mod Assistance. Outcome: Progressing   Problem: RH SAFETY Goal: RH STG ADHERE TO SAFETY PRECAUTIONS W/ASSISTANCE/DEVICE Description: STG Adhere to Safety Precautions With mod Assistance/Device. Outcome: Progressing   Problem: RH PAIN MANAGEMENT Goal: RH STG PAIN MANAGED AT OR BELOW PT'S PAIN GOAL Description: Pt will report pain of less than 4 with min assistance Outcome: Progressing   Problem: RH KNOWLEDGE DEFICIT Goal: RH STG INCREASE KNOWLEDGE OF HYPERTENSION Description: Family will review stroke book and be able to identify factors related to hypertension with min assist Outcome: Progressing Goal: RH STG INCREASE KNOWLEDGE OF STROKE PROPHYLAXIS Description: Family will be able to identify stroke prophylaxis with min assist Outcome: Progressing   Problem: RH KNOWLEDGE DEFICIT Goal: RH STG INCREASE KNOWLEGDE OF HYPERLIPIDEMIA Description: Family will be able to identify factors related to HLD  and secondary stroke, dietary restrictions and medication management for with min assist  Outcome: Progressing   Problem: RH  Vision Goal: RH LTG Vision (Specify) Outcome: Progressing   

## 2019-09-07 ENCOUNTER — Inpatient Hospital Stay (HOSPITAL_COMMUNITY): Payer: Medicare Other

## 2019-09-07 ENCOUNTER — Inpatient Hospital Stay (HOSPITAL_COMMUNITY): Payer: Medicare Other | Admitting: Physical Therapy

## 2019-09-07 NOTE — Progress Notes (Signed)
[facility] PHYSICAL MEDICINE & REHABILITATION PROGRESS NOTE   Subjective/Complaints:  Pt seen this AM- reports she's done with breathing treatment she was receiving- no issues breathing-  Denies pain-  sats this AM were 100%- now 78%- no calls about issues- will ask nursing to recheck and let me know.       ROS:  Pt denies SOB, abd pain, CP, N/V/C/D, and vision changes   Objective:   No results found. No results for input(s): WBC, HGB, HCT, PLT in the last 72 hours. No results for input(s): NA, K, CL, CO2, GLUCOSE, BUN, CREATININE, CALCIUM in the last 72 hours.  Intake/Output Summary (Last 24 hours) at 09/07/2019 1518 Last data filed at 09/07/2019 1406 Gross per 24 hour  Intake 170 ml  Output 246 ml  Net -76 ml     Physical Exam: Vital Signs Blood pressure (!) 105/57, pulse 60, temperature 97.6 F (36.4 C), temperature source Oral, resp. rate 20, weight 54.8 kg, SpO2 (!) 78 %.  General: No acute distress; in bed supine receiving end of breathing treatment, NAD Mood and affect are appropriate Heart: borderline bradycardia- regular rhythm Lungs: coarse breath sounds, but no wheezing, no rhonchi, no rales- good air movement B/L Abdomen: Soft, NT, ND, (+)BS  Extremities: No clubbing, cyanosis, or edema Skin: No evidence of breakdown, no evidence of rash    Neurologic: Cranial nerves II through XII intact, motor strength is 5/5 in RIght and 4/5 left deltoid, bicep, tricep, grip, hip flexor, knee extensors, ankle dorsiflexor and plantar flexor Sensory exam absent LUE and LLE  Cerebellar exam normal finger to nose to finger as well as heel to shin in bilateral upper and lower extremities Musculoskeletal: Full range of motion in all 4 extremities. No joint swelling   Assessment/Plan: 1. Functional deficits secondary to cerebellar hemorrhage which require 3+ hours per day of interdisciplinary therapy in a comprehensive inpatient rehab setting.  Physiatrist is providing  close team supervision and 24 hour management of active medical problems listed below.  Physiatrist and rehab team continue to assess barriers to discharge/monitor patient progress toward functional and medical goals  Care Tool:  Bathing  Bathing activity did not occur: Refused Body parts bathed by patient: Left arm, Chest, Abdomen, Right upper leg, Left upper leg, Face, Right arm, Right lower leg   Body parts bathed by helper: Right lower leg, Left lower leg, Right arm, Buttocks, Front perineal area     Bathing assist Assist Level: Moderate Assistance - Patient 50 - 74%     Upper Body Dressing/Undressing Upper body dressing   What is the patient wearing?: Pull over shirt    Upper body assist Assist Level: Maximal Assistance - Patient 25 - 49%    Lower Body Dressing/Undressing Lower body dressing      What is the patient wearing?: Pants     Lower body assist Assist for lower body dressing: Total Assistance - Patient < 25%     Toileting Toileting    Toileting assist Assist for toileting: Total Assistance - Patient < 25%     Transfers Chair/bed transfer  Transfers assist  Chair/bed transfer activity did not occur: Safety/medical concerns  Chair/bed transfer assist level: 2 Helpers     Locomotion Ambulation   Ambulation assist   Ambulation activity did not occur: Safety/medical concerns          Walk 10 feet activity   Assist  Walk 10 feet activity did not occur: Safety/medical concerns  Walk 50 feet activity   Assist Walk 50 feet with 2 turns activity did not occur: Safety/medical concerns         Walk 150 feet activity   Assist Walk 150 feet activity did not occur: Safety/medical concerns         Walk 10 feet on uneven surface  activity   Assist Walk 10 feet on uneven surfaces activity did not occur: Safety/medical concerns         Wheelchair     Assist Will patient use wheelchair at discharge?: No (No Long term  goals set by PT)             Wheelchair 50 feet with 2 turns activity    Assist            Wheelchair 150 feet activity     Assist          Blood pressure (!) 105/57, pulse 60, temperature 97.6 F (36.4 C), temperature source Oral, resp. rate 20, weight 54.8 kg, SpO2 (!) 78 %.  Medical Problem List and Plan: 1.  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  Antithrombotics: -DVT/anticoagulation:  Mechanical: Sequential compression devices, below knee Bilateral lower extremities  Vascular ultrasound negative for DVT             -antiplatelet therapy: N/a due to bleed.  3. Pain Management: Back pain resolved with treatment of UTI. Discontinued hydrocodone and will use tramadol or tylenol prn for now.   7/31- pt denies any pain- con't tramadol/tylenol prn 4. Anxiety disorder/Mood:Neuropsych     limit sedating meds  -antipsychotic agents: N/a 5. Neuropsych: This patient is not fully capable of making decisions on her own behalf. 6. Skin/Wound Care:  Routine pressure relief measures.  7. Fluids/Electrolytes/Nutrition: Strict I/Os. Meal intake 50-75% 8. NSTEMI with fluid overload: Treated medically with Coreg, Lisinopril and Lipitor.  9. Severe COPD: Encourage pulmonary hygiene--continue flutter. Wean oxygen to off as able as has not use it for past year per son. Albuterol MDI prn.  Discussed with nursing to maintain oxygen sats in high 80s/low 90s, avoid 100%  7/31- sats 78% this afternoon- no calls about issues- will check with nursing and double check it's OK- sounded great this AM  Continues to require supplemental oxygen prn 10. E coli UTI: Has completed 7 day antibiotic course on 07/11.  11. HTN: SBP goal <160. Monitor BP --on Norvasc and HCTZ.  Vitals:   09/07/19 0320 09/07/19 1411  BP: 127/67 (!) 105/57  Pulse: 64 60  Resp: 18  20  Temp: 97.8 F (36.6 C) 97.6 F (36.4 C)  SpO2: 100% (!) 78%  7/31- fair to soft control this 24 hours- con't regimen 12. Lethargy: improved, mainly CVA related  13. Urinary retention with incontinence: Continue monitor voiding with PVR checks/bladder scans 4-6 hours and I/O caht to keep volumes <300 cc.   Flomax started no catheterization for over 2 days  14. A fib: Not AC candidate due to hemorrhage.  Rate controlled. 15.  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  Advance diet as tolerated 16. Tachypnea:   Related to bronchospasm and atelectasis, maintaining sats at rest on RA but needs O2 2 L to maintain above 90% during therapy      IS  10 puff q 1 hr- pt need reminders  Improved 17.  Bowel incont  Remains incontinent  on 7/24  7/31- bowel incontinent- LBM 7/29 x2 18.  Elevated BUN/creatinine  Holding IVF, repeat BUN Cr normalized  19.  Normal CBG, no hx DM will d/c CBG  LOS: 19 days A FACE TO FACE EVALUATION WAS PERFORMED  Febe Champa 09/07/2019, 3:18 PM

## 2019-09-07 NOTE — Progress Notes (Signed)
Physical Therapy Session Note  Patient Details  Name: Caitlin Vasquez MRN: [id number] Date of Birth: 1945-03-03  Today's Date: 09/07/2019 PT Individual Time: [phone] PT Individual Time Calculation (min): 55 min   Short Term Goals: Week 3:  PT Short Term Goal 1 (Week 3): = to LTGs based on ELOS  Skilled Therapeutic Interventions/Progress Updates:  Pt received in bed with nurse in room but exiting. Pt requesting to eat lunch & finish while sitting up in bed. PT educated pt on importance of OOB activity to increase strength & activity tolerance & pt reluctantly agreeable to sitting on EOB to consume lunch. Pt requires mod assist for supine>sitting EOB with HOB elevated & bed rails with cuing to initiate uprighting herself. Pt requires up to max assist for static sitting balance EOB as pt frequently with L LOB and pushing L with RUE. Pt is able to lean onto R elbow on command but demonstrates impaired awareness re: midline orientation & need to correct. Pt requests yogurt & consumes some sitting EOB. Pt wheezing & vitals checked SpO2 = 83 but increased up to 100% (pt on 2L/min supplemental oxygen, breathing through mouth during session) & HR initially 122 decreasing to 85 bpm. Nurse made aware & administered breathing treatment & SpO2 reduced to 1.5 L/min via nasal cannula. Pt transferred bed>w/c on R with mod/max assist +2 for safety as pt abruptly sits before bottom is completely on seat & demonstrates impaired awareness of this. Pt also demonstrates extensor tone in LLE & PT blocks L knee/foot and assists with facilitating anterior weight shifting to allow pt to fully pivot to w/c & scoot back in seat. Pt consumed yogurt while sitting in w/c with cuing to locate food to L of midline and for midline orientation as pt continues to demonstrate LOB to L. Pt transported to/from gym & pt completes squat pivot w/c<>mat table with +2 in same manner. Pt sits EOM and engages in reaching R for cards for ~3 minutes  with focus on R lateral lean for midline orientation & correcting L lateral lean. Pt reports "I'm dying" then reports she's just being "dramatic" but pt is tired so returned to w/c in same manner. At end of session SpO2 = 91% on 1.5 L/min, HR = 108 bpm & nurse Skeet Simmer) made aware. Pt encouraged to sit up to finish lunch & Skeet Simmer planning to assist her with task. Call bell buttoned to pt's lap & chair alarm donned.  Pt demonstrates decreased awareness overall, including poor safety awareness during transfers, impaired awareness of not properly sitting in w/c seat, impaired awareness of midline orientation, pt attempting to wipe mouth while getting a breathing tx throughout session.  Therapy Documentation Precautions:  Precautions Precautions: Fall, Other (comment) Precaution Comments: supplemental O2, L side inattention, L visual field deficits, left side sensory deficits, left hemiparesis, pusher to the left Restrictions Weight Bearing Restrictions: No LLE Weight Bearing:  (no WB precautions)    Pain: Pt c/o LE discomfort & repositioned with pt reporting pain relief.    Therapy/Group: Individual Therapy  Sandi Mariscal 09/07/2019, 3:07 PM

## 2019-09-07 NOTE — Progress Notes (Signed)
Occupational Therapy Session Note  Patient Details  Name: Caitlin Vasquez MRN: [id number] Date of Birth: 1945/06/04  Today's Date: 09/07/2019 OT Individual Time: 0800-0900 OT Individual Time Calculation (min): 60 min    Short Term Goals: Week 2:  OT Short Term Goal 1 (Week 2): Pt will maintain static sitting balance for 3 mins EOB with min assist in preparation for selfcare tasks. OT Short Term Goal 2 (Week 2): Pt will complete UB bathing in supported sitting with no more than min assist for two consecutive sessions. OT Short Term Goal 2 - Progress (Week 2): Progressing toward goal OT Short Term Goal 3 (Week 2): Pt will locate grooming and bathing items placed left of midline with no more than mod instructional cueing. OT Short Term Goal 3 - Progress (Week 2): Progressing toward goal OT Short Term Goal 4 (Week 2): Pt will donn a pullover shirt with mod assist following hemi dressing techniques. OT Short Term Goal 4 - Progress (Week 2): Progressing toward goal  Skilled Therapeutic Interventions/Progress Updates:    OT session focused on ADL retraining, activity tolerance, and bed mobility. OT spoke with RN prior to session as RN informed of difficulty with arousal. OT facilitated alertness with washing face as pt opened eyes and stated name and DOB. O2 monitored throughout session and >95% while on 2 L. Pt agreeable to dressing on this date, completing LB dressing while supine in bed with total A and min-mod for rolling L<>R. Transitioned to sitting EOB with mod A. Poor midline awareness noted, therefore transitioned to weightbearing on R elbow for ~1 minutes to minimize pushing. Transitioned back to sitting EOB for ~10 min with pt achieving min assist for balance up to 40 seconds. Donned shirt with max A and poor recall of hemi dressing. Pt requested to transition back to supine due to not feeling well. Upon positioning in bed with HOB elevated, pt requested breakfast. OT assisted, providing min A  for small sips of thickened liquids and min cues for safe swallowing techniques and completing intra oral tongue lateralization to clear food from cheeks. Pt demonstrated no signs of aspiration during meal and left with HOB elevated and all needs in reach.   Therapy Documentation Precautions:  Precautions Precautions: Fall, Other (comment) Precaution Comments: supplemental O2, L side inattention, L visual field deficits, left side sensory deficits, left hemiparesis, pusher to the left Restrictions Weight Bearing Restrictions: No LLE Weight Bearing:  (no WB precautions) General:   Vital Signs:   Pain:   ADL: ADL Eating: Minimal assistance Where Assessed-Eating: Bed level Grooming: Minimal assistance Where Assessed-Grooming: Bed level Upper Body Bathing: Maximal assistance Where Assessed-Upper Body Bathing: Edge of bed Lower Body Bathing: Dependent Where Assessed-Lower Body Bathing: Edge of bed, Bed level Upper Body Dressing: Dependent Where Assessed-Upper Body Dressing: Edge of bed Lower Body Dressing: Dependent Where Assessed-Lower Body Dressing: Edge of bed Toileting: Dependent Where Assessed-Toileting: Bedside Commode Toilet Transfer: Dependent Toilet Transfer Method: Ambulance person: Tourist information centre manager    Praxis   Exercises:   Other Treatments:     Therapy/Group: Individual Therapy  Daneil Dan 09/07/2019, 12:02 PM

## 2019-09-08 ENCOUNTER — Inpatient Hospital Stay (HOSPITAL_COMMUNITY): Payer: Medicare Other

## 2019-09-08 NOTE — Progress Notes (Signed)
[place] PHYSICAL MEDICINE & REHABILITATION PROGRESS NOTE   Subjective/Complaints:   Pt reports no issues- slept well- denies SOB and pain.   ROS:   Pt denies SOB, abd pain, CP, N/V/C/D, and vision changes   Objective:   No results found. No results for input(s): WBC, HGB, HCT, PLT in the last 72 hours. No results for input(s): NA, K, CL, CO2, GLUCOSE, BUN, CREATININE, CALCIUM in the last 72 hours.  Intake/Output Summary (Last 24 hours) at 09/08/2019 1520 Last data filed at 09/08/2019 0841 Gross per 24 hour  Intake 250 ml  Output --  Net 250 ml     Physical Exam: Vital Signs Blood pressure 92/69, pulse 71, temperature 98 F (36.7 C), resp. rate 18, weight 54.8 kg, SpO2 97 %.  General: No acute distress; supine in bed- wearing O2, appropriate, sleepy, NAD- said wants to go back to sleep Mood and affect are appropriate Heart: RRR Lungs: coarse breath sounds, but good air movement B/L Abdomen: Soft, NT, ND, (+)BS   Extremities: No clubbing, cyanosis, or edema Skin: No evidence of breakdown, no evidence of rash    Neurologic: Cranial nerves II through XII intact, motor strength is 5/5 in RIght and 4/5 left deltoid, bicep, tricep, grip, hip flexor, knee extensors, ankle dorsiflexor and plantar flexor Sensory exam absent LUE and LLE  Cerebellar exam normal finger to nose to finger as well as heel to shin in bilateral upper and lower extremities Musculoskeletal: Full range of motion in all 4 extremities. No joint swelling   Assessment/Plan: 1. Functional deficits secondary to cerebellar hemorrhage which require 3+ hours per day of interdisciplinary therapy in a comprehensive inpatient rehab setting.  Physiatrist is providing close team supervision and 24 hour management of active medical problems listed below.  Physiatrist and rehab team continue to assess barriers to discharge/monitor patient progress toward functional and medical goals  Care Tool:  Bathing  Bathing  activity did not occur: Refused Body parts bathed by patient: Left arm, Chest, Abdomen, Right upper leg, Left upper leg, Face, Right arm, Right lower leg   Body parts bathed by helper: Right lower leg, Left lower leg, Right arm, Buttocks, Front perineal area     Bathing assist Assist Level: Moderate Assistance - Patient 50 - 74%     Upper Body Dressing/Undressing Upper body dressing   What is the patient wearing?: Pull over shirt    Upper body assist Assist Level: Maximal Assistance - Patient 25 - 49%    Lower Body Dressing/Undressing Lower body dressing      What is the patient wearing?: Pants     Lower body assist Assist for lower body dressing: Total Assistance - Patient < 25%     Toileting Toileting    Toileting assist Assist for toileting: Total Assistance - Patient < 25%     Transfers Chair/bed transfer  Transfers assist  Chair/bed transfer activity did not occur: Safety/medical concerns  Chair/bed transfer assist level: 2 Helpers     Locomotion Ambulation   Ambulation assist   Ambulation activity did not occur: Safety/medical concerns          Walk 10 feet activity   Assist  Walk 10 feet activity did not occur: Safety/medical concerns        Walk 50 feet activity   Assist Walk 50 feet with 2 turns activity did not occur: Safety/medical concerns         Walk 150 feet activity   Assist Walk 150 feet activity did  not occur: Safety/medical concerns         Walk 10 feet on uneven surface  activity   Assist Walk 10 feet on uneven surfaces activity did not occur: Safety/medical concerns         Wheelchair     Assist Will patient use wheelchair at discharge?: No (No Long term goals set by PT)             Wheelchair 50 feet with 2 turns activity    Assist            Wheelchair 150 feet activity     Assist          Blood pressure 92/69, pulse 71, temperature 98 F (36.7 C), resp. rate 18, weight  54.8 kg, SpO2 97 %.  Medical Problem List and Plan: 1.  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  Antithrombotics: -DVT/anticoagulation:  Mechanical: Sequential compression devices, below knee Bilateral lower extremities  Vascular ultrasound negative for DVT             -antiplatelet therapy: N/a due to bleed.  3. Pain Management: Back pain resolved with treatment of UTI. Discontinued hydrocodone and will use tramadol or tylenol prn for now.   8/1- pt denies pain- con't prns 4. Anxiety disorder/Mood:Neuropsych     limit sedating meds  -antipsychotic agents: N/a 5. Neuropsych: This patient is not fully capable of making decisions on her own behalf. 6. Skin/Wound Care:  Routine pressure relief measures.  7. Fluids/Electrolytes/Nutrition: Strict I/Os. Meal intake 50-75% 8. NSTEMI with fluid overload: Treated medically with Coreg, Lisinopril and Lipitor.  9. Severe COPD: Encourage pulmonary hygiene--continue flutter. Wean oxygen to off as able as has not use it for past year per son. Albuterol MDI prn.  Discussed with nursing to maintain oxygen sats in high 80s/low 90s, avoid 100%  7/31- sats 78% this afternoon- no calls about issues- will check with nursing and double check it's OK- sounded great this AM  8/1- back up to 97% with O2 this AM- will need to wean O2 based on need  Continues to require supplemental oxygen prn 10. E coli UTI: Has completed 7 day antibiotic course on 07/11.  11. HTN: SBP goal <160. Monitor BP --on Norvasc and HCTZ.  Vitals:   09/08/19 0413 09/08/19 1316  BP: (!) 114/58 92/69  Pulse: 69 71  Resp: 20 18  Temp: 98 F (36.7 C)   SpO2: 98% 97%  8/1- BP fair to soft control- con't regimen 12. Lethargy: improved, mainly CVA related  13. Urinary retention with incontinence: Continue monitor voiding with PVR checks/bladder scans 4-6  hours and I/O caht to keep volumes <300 cc.   Flomax started no catheterization for over 2 days  14. A fib: Not AC candidate due to hemorrhage.  Rate controlled. 15.  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  Advance diet as tolerated 16. Tachypnea:   Related to bronchospasm and atelectasis, maintaining sats at rest on RA but needs O2 2 L to maintain above 90% during therapy   IS  10 puff q 1 hr- pt need reminders  Improved 17.  Bowel incont  Remains incontinent on 7/24  7/31- bowel incontinent- LBM 7/29 x2  8/1- LBM last night- incontinent 18.  Elevated BUN/creatinine  Holding IVF, repeat BUN Cr normalized  19.  Normal CBG, no hx DM will d/c CBG  LOS: 20 days A FACE TO FACE EVALUATION WAS PERFORMED  Olegario Emberson 09/08/2019, 3:20 PM

## 2019-09-08 NOTE — Plan of Care (Signed)
  Problem: Consults Goal: RH STROKE PATIENT EDUCATION Description: See Patient Education module for education specifics  Outcome: Progressing   Problem: RH BOWEL ELIMINATION Goal: RH STG MANAGE BOWEL WITH ASSISTANCE Description: STG Manage Bowel with mod Assistance. Outcome: Progressing Goal: RH STG MANAGE BOWEL W/MEDICATION W/ASSISTANCE Description: STG Manage Bowel with Medication with mod I Assistance. Outcome: Progressing   Problem: RH BLADDER ELIMINATION Goal: RH STG MANAGE BLADDER WITH ASSISTANCE Description: STG Manage Bladder With mod Assistance Outcome: Progressing   Problem: RH SKIN INTEGRITY Goal: RH STG SKIN FREE OF INFECTION/BREAKDOWN Description: No additional breakdown will occur while iin rehab with mod assistance  Outcome: Progressing Goal: RH STG MAINTAIN SKIN INTEGRITY WITH ASSISTANCE Description: STG Maintain Skin Integrity With mod Assistance. Outcome: Progressing   Problem: RH SAFETY Goal: RH STG ADHERE TO SAFETY PRECAUTIONS W/ASSISTANCE/DEVICE Description: STG Adhere to Safety Precautions With mod Assistance/Device. Outcome: Progressing   Problem: RH PAIN MANAGEMENT Goal: RH STG PAIN MANAGED AT OR BELOW PT'S PAIN GOAL Description: Pt will report pain of less than 4 with min assistance Outcome: Progressing   Problem: RH KNOWLEDGE DEFICIT Goal: RH STG INCREASE KNOWLEDGE OF HYPERTENSION Description: Family will review stroke book and be able to identify factors related to hypertension with min assist Outcome: Progressing Goal: RH STG INCREASE KNOWLEDGE OF STROKE PROPHYLAXIS Description: Family will be able to identify stroke prophylaxis with min assist Outcome: Progressing   Problem: RH KNOWLEDGE DEFICIT Goal: RH STG INCREASE KNOWLEGDE OF HYPERLIPIDEMIA Description: Family will be able to identify factors related to HLD  and secondary stroke, dietary restrictions and medication management for with min assist  Outcome: Progressing   Problem: RH  Vision Goal: RH LTG Vision (Specify) Outcome: Progressing   

## 2019-09-08 NOTE — Progress Notes (Signed)
Occupational Therapy Session Note  Patient Details  Name: Caitlin Vasquez MRN: [id number] Date of Birth: May 17, 1945  Today's Date: 09/08/2019 OT Individual Time: 8209-9068 OT Individual Time Calculation (min): 60 min    Short Term Goals: Week 3:  OT Short Term Goal 1 (Week 3): STG=LTG OT Short Term Goal 2 (Week 3): Family education  Skilled Therapeutic Interventions/Progress Updates:    Pt received supine sleeping, easily awoken with entrance of OT and NT to room. NT assisting in boosting pt up in bed to ensure proper upright posture for eating. Breakfast tray set up for pt and HOB raised to 52 degrees. SpO2 98% supine on 1.5 L O2. Pt's tray was set up to encourage attention to the L, with nectar thick liquids and oatmeal placed on far L. Pt initially required min cueing to find items and then was able to locate these items with no further cueing. Min cueing provided for self-monitoring/correction of L pocketing. Mod HOH facilitation required for LUE to stabilize banana for pt to open with the dominant RUE. Cueing provided for rest break 2/2 laborious breathing. Spo2 checked again, still WNL, >95% on 1.5 L O2. Pt required mod A for repositioning, with increased L lean in bed. Cueing provided for pt to breath through her nose, rather than mouth breathing, pt able to comply briefly before resuming mouth breathing. Pt finished eating, eating about 25% of eggs, full nectar thick milk and full orange juice. Pt transitioned to L sidelying with min A for max A clothing management and brief change. Mod A for peri hygiene, pt able to clean anteriorly. Pt required max A to pull up pants while supine in modified bridge. Pt was positioned in sidelying and left with all needs met, bed alarm set.   Therapy Documentation Precautions:  Precautions Precautions: Fall, Other (comment) Precaution Comments: supplemental O2, L side inattention, L visual field deficits, left side sensory deficits, left hemiparesis,  pusher to the left Restrictions Weight Bearing Restrictions: No LLE Weight Bearing:  (no WB precautions)   Therapy/Group: Individual Therapy  Curtis Sites 09/08/2019, 7:19 AM

## 2019-09-08 NOTE — Progress Notes (Signed)
Occupational Therapy Session Note  Patient Details  Name: Caitlin Vasquez MRN: [id number] Date of Birth: 01/22/1946  Today's Date: 09/08/2019 OT Individual Time: 1427-1500 OT Individual Time Calculation (min): 33 min    Short Term Goals: Week 3:  OT Short Term Goal 1 (Week 3): STG=LTG OT Short Term Goal 2 (Week 3): Family education  Skilled Therapeutic Interventions/Progress Updates:   Pt received sitting in TIS w/c, c/o pain in her buttocks from sitting and requesting to return to bed. Max A squat pivot transfer back to bed. Pt requesting to use bed pan. Total A clothing management. Pt incontinent of BM and urine in brief. Pt able to roll R for bedpan placement, min A for thorough roll. Pt completed roll to the L with max A. Pt voided more BM in the bedpan. Pt required total A for hygiene. With bed in trend position pt was able to pull herself up with min A and mod cueing. Pt was left supine with all needs met, sons and NT present.   Therapy Documentation Precautions:  Precautions Precautions: Fall, Other (comment) Precaution Comments: supplemental O2, L side inattention, L visual field deficits, left side sensory deficits, left hemiparesis, pusher to the left Restrictions Weight Bearing Restrictions: No LLE Weight Bearing:  (no WB precautions)   Therapy/Group: Individual Therapy  Curtis Sites 09/08/2019, 2:46 PM

## 2019-09-08 NOTE — Progress Notes (Signed)
Physical Therapy Session Note 15 min missed time due to lethargy  Patient Details  Name: Caitlin Vasquez MRN: [id number] Date of Birth: Feb 24, 1945  Today's Date: 09/08/2019 PT Individual Time: 1215-1300 PT Individual Time Calculation (min): 45 min   Short Term Goals: Week 1:  PT Short Term Goal 1 (Week 1): Pt will perform supine<>sit with mod assist PT Short Term Goal 1 - Progress (Week 1): Progressing toward goal PT Short Term Goal 2 (Week 1): Pt will be able to maintain midline orientation in sitting for 39mnute with no more than min assist of 1 PT Short Term Goal 2 - Progress (Week 1): Met PT Short Term Goal 3 (Week 1): Pt will perform sit<>stand with +2 mod assist PT Short Term Goal 3 - Progress (Week 1): Met PT Short Term Goal 4 (Week 1): Pt will perform bed<>chair transfers with +2 max assist PT Short Term Goal 4 - Progress (Week 1): Met Week 2:  PT Short Term Goal 1 (Week 2): Pt will perform supine<>sit with mod assist PT Short Term Goal 1 - Progress (Week 2): Met PT Short Term Goal 2 (Week 2): Pt will perform bed<>chair transfers with max assist of 1 consistently PT Short Term Goal 2 - Progress (Week 2): Met PT Short Term Goal 3 (Week 2): Pt will perform sit<>stands with max assist of 1 consistently PT Short Term Goal 3 - Progress (Week 2): Met PT Short Term Goal 4 (Week 2): Pt will tolerate standing position using LRAD at least 30 seconds with max assist of 1 PT Short Term Goal 4 - Progress (Week 2): Progressing toward goal (not yet performing this consistently) Week 3:  PT Short Term Goal 1 (Week 3): = to LTGs based on ELOS  Skilled Therapeutic Interventions/Progress Updates:    PAIN denies pain  Pt initially supine, audible wheezing but 02 sats 97, HR 76 on 1.5L 02 entire session. Pt supine to sit w/max assist, leans to L/pushes heavily w/R.  Attempted to work on midline orientation/reaching task but pt repeats "I am so tired and repeatedly returns to R S/L.   Pt max assist  side to sit and SPT to wc w/mod assist of 1. Once in wc pt able to scoot in chair w/mod assist for repositioning.  Pt  continued to be lethargic during session, difts in and out of sleep. 02Sats 95, HR 87  Pt engaged in peg puzzle activity in sitting requiring visual scanning to L to find and place pegs into peg board.  Pt required max cueing and was unable to follow simple commands w/task.  Did sporadically place random pegs into board but unable to follow verbal or pictoral directions.  Pt quite lethargic today.  Session ended early by therapist due to pt inablitiy to engage in full session.    At end of session, Pt left oob in wc w/alarm belt set and needs in reach    Therapy Documentation Precautions:  Precautions Precautions: Fall, Other (comment) Precaution Comments: supplemental O2, L side inattention, L visual field deficits, left side sensory deficits, left hemiparesis, pusher to the left Restrictions Weight Bearing Restrictions: No LLE Weight Bearing:  (no WB precautions) .   Therapy/Group: Individual Therapy  BCallie Fielding PElwood8/02/2019, 4:22 PM

## 2019-09-09 ENCOUNTER — Inpatient Hospital Stay (HOSPITAL_COMMUNITY): Payer: Medicare Other | Admitting: Occupational Therapy

## 2019-09-09 ENCOUNTER — Encounter (HOSPITAL_COMMUNITY): Payer: Medicare Other | Admitting: Speech Pathology

## 2019-09-09 ENCOUNTER — Inpatient Hospital Stay (HOSPITAL_COMMUNITY): Payer: Medicare Other

## 2019-09-09 ENCOUNTER — Inpatient Hospital Stay (HOSPITAL_COMMUNITY): Payer: Medicare Other | Admitting: Physical Therapy

## 2019-09-09 LAB — BASIC METABOLIC PANEL
Anion gap: 10 (ref 5–15)
BUN: 46 mg/dL — ABNORMAL HIGH (ref 8–23)
CO2: 25 mmol/L (ref 22–32)
Calcium: 9.2 mg/dL (ref 8.9–10.3)
Chloride: 108 mmol/L (ref 98–111)
Creatinine, Ser: 1.24 mg/dL — ABNORMAL HIGH (ref 0.44–1.00)
GFR calc Af Amer: 49 mL/min — ABNORMAL LOW (ref >60)
GFR calc non Af Amer: 42 mL/min — ABNORMAL LOW (ref >60)
Glucose, Bld: 106 mg/dL — ABNORMAL HIGH (ref 70–99)
Potassium: 4.9 mmol/L (ref 3.5–5.1)
Sodium: 143 mmol/L (ref 135–145)

## 2019-09-09 LAB — CBC
HCT: 37.6 % (ref 36.0–46.0)
Hemoglobin: 11.8 g/dL — ABNORMAL LOW (ref 12.0–15.0)
MCH: 28.4 pg (ref 26.0–34.0)
MCHC: 31.4 g/dL (ref 30.0–36.0)
MCV: 90.4 fL (ref 80.0–100.0)
Platelets: 217 10*3/uL (ref 150–400)
RBC: 4.16 MIL/uL (ref 3.87–5.11)
RDW: 13.8 % (ref 11.5–15.5)
WBC: 8.2 10*3/uL (ref 4.0–10.5)
nRBC: 0 % (ref 0.0–0.2)

## 2019-09-09 MED ORDER — SODIUM CHLORIDE 0.45 % IV SOLN: INTRAVENOUS | Status: DC

## 2019-09-09 NOTE — Progress Notes (Signed)
Modified Barium Swallow Progress Note  Patient Details  Name: Caitlin Vasquez MRN: [id number] Date of Birth: 1945/11/08  Today's Date: 09/09/2019  Modified Barium Swallow completed.  Full report located under Chart Review in the Imaging Section.  Brief recommendations include the following:  Clinical Impression   Pt demonstrates noteworthy improvements in oropharyngeal swallow function today in comparison to previous MBSS's (conducted 08/12/19 and 08/29/19). No penetration or aspiration was observed across any liquid or solid textures. Her swallow initiation still occurs at the level of the pyriform sinuses, however no pooling is occurring and delay is much more mild in comparison to previous studies. There is also no longer a functional impact of delayed initiation, as her airway protection remained fully in tact across trials. Use of a straw did no impact swallow function. Due to lingual/palatal residue, which later falls into the pharynx and results and vallecular residue, pt does need to utilize an extra dry swallow. She demonstrated ability to follow commands to perform volitional dry swallow with 100% accuracy, and it was effective to clear the residue, which will further minimize her aspiration risk. Reduced bolus cohesion and posterior propulsion of thin with barium pill noted, and barium pill ultimately expectorated. Although some lingual pumping occurred when she consume barium pill with applesauce, deglutition was functional and no airway intrusion noted. Although SLP cannot diagnose below the level of the upper esophageal sphincter, a brief scans of the esophagus revealed pill and applesauce stagnant in esophagus, which may warrant further investigation from GI perspective if pt is experiencing symptoms. Recommend pt continue dysphagia 3 (mechnical soft) solid textures, upgrade to thin liquids using slow rate and extra dry swallows, remain seated upright at least 30-60 mins after intake, please  provide full supervision during meals to cue for swallow strategies. Medications may be administered one at a time whole in applesauce. ST will continue to provide skilled interventions for dysphagia while inpatient in order to ensure diet safety and efficiency and promote more independence with use of her swallow strategies.    Swallow Evaluation Recommendations       SLP Diet Recommendations: Dysphagia 3 (Mech soft) solids;Thin liquid   Liquid Administration via: Cup;Straw   Medication Administration: Whole meds with puree   Supervision: Staff to assist with self feeding;Full supervision/cueing for compensatory strategies   Compensations: Slow rate;Small sips/bites;Multiple dry swallows after each bite/sip   Postural Changes: Remain semi-upright after after feeds/meals (Comment);Seated upright at 90 degrees   Oral Care Recommendations: Oral care BID        Little Ishikawa 09/09/2019,10:31 AM

## 2019-09-09 NOTE — Progress Notes (Signed)
Occupational Therapy Session Note  Patient Details  Name: Caitlin Vasquez MRN: [id number] Date of Birth: [birth year]  Today's Date: 09/09/2019 OT Individual Time: 1117-1204 OT Individual Time Calculation (min): 47 min    Short Term Goals: Week 3:  OT Short Term Goal 1 (Week 3): STG=LTG OT Short Term Goal 2 (Week 3): Family education  Skilled Therapeutic Interventions/Progress Updates:  Patient met lying supine in bed in agreement with OT treatment session with focus on self-care re-education and NMR as detailed below. Mod A for supine <> EOB with increased time/effort. Static sitting balance seated EOB with visual feedback from mirror and Mod A to Max A with fatigue to to correct pushing. Max A for toilet transfer toileting/hygiene/clothing management with use of Stedy and cueing for orientation to midline. UB dressing with Max A and cues for hemi-dressing technique and Max A for LB dressing seated on BSC. Session concluded with patient lying supine in bed with pillows positioned to maintain midline, call bell within reach and bed alarm activated.   Therapy Documentation Precautions:  Precautions Precautions: Fall, Other (comment) Precaution Comments: supplemental O2, L side inattention, L visual field deficits, left side sensory deficits, left hemiparesis, pusher to the left Restrictions Weight Bearing Restrictions: No LLE Weight Bearing:  (no WB precautions) General:    Therapy/Group: Individual Therapy  [name] 09/09/2019, 9:53 AM

## 2019-09-09 NOTE — Progress Notes (Signed)
[place] PHYSICAL MEDICINE & REHABILITATION PROGRESS NOTE   Subjective/Complaints:  Discussed need to drink more , restart IVF and stop diuretics   ROS:   Pt denies SOB, abd pain, CP, N/V/C/D, and vision changes   Objective:   No results found. Recent Labs    09/09/19 0601  WBC 8.2  HGB 11.8*  HCT 37.6  PLT 217   Recent Labs    09/09/19 0601  NA 143  K 4.9  CL 108  CO2 25  GLUCOSE 106*  BUN 46*  CREATININE 1.24*  CALCIUM 9.2    Intake/Output Summary (Last 24 hours) at 09/09/2019 0904 Last data filed at 09/09/2019 0845 Gross per 24 hour  Intake 240 ml  Output --  Net 240 ml     Physical Exam: Vital Signs Blood pressure (!) 151/68, pulse 71, temperature 98.2 F (36.8 C), temperature source Oral, resp. rate 20, weight 54.3 kg, SpO2 96 %.  General: No acute distress Mood and affect are appropriate Heart: Regular rate and rhythm no rubs murmurs or extra sounds Lungs: Clear to auscultation, breathing unlabored, no rales or wheezes Abdomen: Positive bowel sounds, soft nontender to palpation, nondistended Extremities: No clubbing, cyanosis, or edema Skin: No evidence of breakdown, no evidence of rash  Neurologic: Cranial nerves II through XII intact, motor strength is 5/5 in RIght and 4/5 left deltoid, bicep, tricep, grip, hip flexor, knee extensors, ankle dorsiflexor and plantar flexor Sensory exam absent LUE and LLE  Cerebellar exam normal finger to nose to finger as well as heel to shin in bilateral upper and lower extremities Musculoskeletal: Full range of motion in all 4 extremities. No joint swelling   Assessment/Plan: 1. Functional deficits secondary to cerebellar hemorrhage which require 3+ hours per day of interdisciplinary therapy in a comprehensive inpatient rehab setting.  Physiatrist is providing close team supervision and 24 hour management of active medical problems listed below.  Physiatrist and rehab team continue to assess barriers to  discharge/monitor patient progress toward functional and medical goals  Care Tool:  Bathing  Bathing activity did not occur: Refused Body parts bathed by patient: Left arm, Chest, Abdomen, Right upper leg, Left upper leg, Face, Right arm, Right lower leg   Body parts bathed by helper: Right lower leg, Left lower leg, Right arm, Buttocks, Front perineal area     Bathing assist Assist Level: Moderate Assistance - Patient 50 - 74%     Upper Body Dressing/Undressing Upper body dressing   What is the patient wearing?: Pull over shirt    Upper body assist Assist Level: Maximal Assistance - Patient 25 - 49%    Lower Body Dressing/Undressing Lower body dressing      What is the patient wearing?: Pants     Lower body assist Assist for lower body dressing: Total Assistance - Patient < 25%     Toileting Toileting    Toileting assist Assist for toileting: Total Assistance - Patient < 25%     Transfers Chair/bed transfer  Transfers assist  Chair/bed transfer activity did not occur: Safety/medical concerns  Chair/bed transfer assist level: Moderate Assistance - Patient 50 - 74%     Locomotion Ambulation   Ambulation assist   Ambulation activity did not occur: Safety/medical concerns          Walk 10 feet activity   Assist  Walk 10 feet activity did not occur: Safety/medical concerns        Walk 50 feet activity   Assist Walk 50 feet with  2 turns activity did not occur: Safety/medical concerns         Walk 150 feet activity   Assist Walk 150 feet activity did not occur: Safety/medical concerns         Walk 10 feet on uneven surface  activity   Assist Walk 10 feet on uneven surfaces activity did not occur: Safety/medical concerns         Wheelchair     Assist Will patient use wheelchair at discharge?: No (No Long term goals set by PT)             Wheelchair 50 feet with 2 turns activity    Assist             Wheelchair 150 feet activity     Assist          Blood pressure (!) 151/68, pulse 71, temperature 98.2 F (36.8 C), temperature source Oral, resp. rate 20, weight 54.3 kg, SpO2 96 %.  Medical Problem List and Plan: 1.  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  Antithrombotics: -DVT/anticoagulation:  Mechanical: Sequential compression devices, below knee Bilateral lower extremities  Vascular ultrasound negative for DVT             -antiplatelet therapy: N/a due to bleed.  3. Pain Management: Back pain resolved with treatment of UTI. Discontinued hydrocodone and will use tramadol or tylenol prn for now.   8/1- pt denies pain- con't prns 4. Anxiety disorder/Mood:Neuropsych     limit sedating meds  -antipsychotic agents: N/a 5. Neuropsych: This patient is not fully capable of making decisions on her own behalf. 6. Skin/Wound Care:  Routine pressure relief measures.  7. Fluids/Electrolytes/Nutrition: Strict I/Os. Meal intake 50-75% 8. NSTEMI with fluid overload: Treated medically with Coreg, Lisinopril and Lipitor.  9. Severe COPD: Encourage pulmonary hygiene--continue flutter. Wean oxygen to off as able as has not use it for past year per son. Albuterol MDI prn.  Discussed with nursing to maintain oxygen sats in high 80s/low 90s, avoid 100%  7/31- sats 78% this afternoon- no calls about issues- will check with nursing and double check it's OK- sounded great this AM  8/1- back up to 97% with O2 this AM- will need to wean O2 based on need  Continues to require supplemental oxygen prn 10. E coli UTI: Has completed 7 day antibiotic course on 07/11.  11. HTN: SBP goal <160. Monitor BP --on Norvasc and HCTZ.  Vitals:   09/08/19 1941 09/09/19 0501  BP: (!) 128/58 (!) 151/68  Pulse: 72 71  Resp: 21 20  Temp: 98.1 F (36.7 C) 98.2 F (36.8 C)  SpO2:  100% 96%  will d/c HCTZ due to poor fluid intake with diuretic use , may need to increase amlodipine monitor  12. Lethargy: improved, mainly CVA related  13. Urinary retention with incontinence: Continue monitor voiding with PVR checks/bladder scans 4-6 hours and I/O caht to keep volumes <300 cc.   Flomax started no catheterization for over 2 days  14. A fib: Not AC candidate due to hemorrhage.  Rate controlled. 15.  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  Advance diet as tolerated 16. Tachypnea:   Related to bronchospasm and atelectasis, maintaining sats at rest on RA but needs O2 2 L to maintain above 90% during therapy   IS  10 puff q 1 hr- pt need  reminders  Improved 17.  Bowel incont  Remains incontinent on 7/24  7/31- bowel incontinent- LBM 7/29 x2  8/1- LBM last night- incontinent 18.  Elevated BUN/creatinine  Holding IVF, repeat BUN Cr elevated Restart IVF- D/C HCTZ    LOS: 21 days A FACE TO FACE EVALUATION WAS PERFORMED  Erick Colace 09/09/2019, 9:04 AM

## 2019-09-09 NOTE — Progress Notes (Signed)
Physical Therapy Session Note  Patient Details  Name: Caitlin Vasquez MRN: [id number] Date of Birth: 1945-08-04  Today's Date: 09/09/2019 PT Individual Time: 1351-1434 PT Individual Time Calculation (min): 43 min   Short Term Goals: Week 3:  PT Short Term Goal 1 (Week 3): = to LTGs based on ELOS  Skilled Therapeutic Interventions/Progress Updates:    Pt received supine in bed, awake and agreeable to therapy session. Received and maintained on 1.5L of O2 via nasal canula - pt continues to have labored breathing and wheezing. Supine>sitting R EOB, HOB partially elevated and using bedrails with mod assist for L hemibody management and trunk upright. Once assisted with propping UEs to assist with trunk control tolerated sitting EOB ~3-32minutes with close supervision/CGA while pt and therapist spoke with pt's son, Sharia Reeve, via telephone, regarding the following: Therapist reminded of scheduled hands-on family training planned tomorrow from 1-4pm. Therapist updated Josh that the plan is to try lightweight wheelchair with contoured back and pressure relieving cushion tomorrow as opposed to a TIS w/c as pt's son concerned he cannot transport the TIS w/c in his sedan - therapist did report that at this time pt has not yet progressed to performing car transfers and may require medical transport - planning to arrange ambulance transport home. Pt's son updates that he is planning to speak with someone today regarding putting in a ramp for home entry.  Pt requests to return to supine for rest break. Sit>R sidelying>supine with mod assist for L hemibody management.  Remainder of therapy session focused on block practice transfers EOB<>w/c: - L squat pivot EOB>w/c with heavy max assist for lifting/pivoting hips due to pt having more significant L trunk lean today - R stand pivot w/c>EOB using R UE support on bedrail with mod/max assist for pivoting hips, pt's L foot slides forward due to knee extensors activating but  not enough weight down through the LE; despite this, transfers towards R appear more safe than transfers to L as pt has improved motor planning this direction and her L inattention and pusher tendencies due not cause as significant of an impairment transferring to R Returned to supine for therapeutic rest break then sat on L EOB to allow for R squat/stand pivot transfer EOB>w/c. Supine>sitting L EOB with max assist for trunk upright as pt not able effectively use L UE to push up and support trunk. R stand pivot EOB>w/c with max assist for pivoting hips - slightly less smooth due to pt not able to reach w/c armrest as well as bedrail.  R stand pivot w/c>EOB with max assist due to increasing fatigue and continues to have L foot sliding forward out from under her as well as slightly impaired motor planning initially pushing with R arm instead of using it to assist with rotating hips - with minimal cuing able to correct. Sit>supine mod assist. Supine scoot towards HOB using bed features with +2 total assist - left R sidelying in bed with pillows therapeutically positioned for pressure relief - needs in reach and bed alarm on.   Therapy Documentation Precautions:  Precautions Precautions: Fall, Other (comment) Precaution Comments: supplemental O2, L side inattention, L visual field deficits, left side sensory deficits, left hemiparesis, pusher to the left Restrictions Weight Bearing Restrictions: No LLE Weight Bearing:  (no WB precautions)  Pain: No reports of pain throughout session.  Therapy/Group: Individual Therapy  Ginny Forth , PT, DPT, CSRS  09/09/2019, 12:24 PM

## 2019-09-09 NOTE — Progress Notes (Signed)
Occupational Therapy Session Note  Patient Details  Name: Caitlin Vasquez MRN: [id number] Date of Birth: Oct 09, 1945  Today's Date: 09/09/2019 OT Individual Time: 9381-0175 OT Individual Time Calculation (min): 42 min    Short Term Goals: Week 3:  OT Short Term Goal 1 (Week 3): STG=LTG OT Short Term Goal 2 (Week 3): Family education  Skilled Therapeutic Interventions/Progress Updates:    Pt in bed to start session working on eating breakfast with NT.  Had her work on transferring to the EOB with max assist and working on self feeding in unsupported sitting.  Max assist needed for sitting balance while working on self feeding.  Increased pushing to the left noted while eating.  Mod instructional cueing for scanning to the left for location of juices and items to be eaten.  She exhibited increased coughing when drinking her thickened juice, requiring cueing to slow down and not try to take as large of a swallow.  Mod instructional cueing for sustained attention for finishing meal as well secondary to internal distraction.  She requested to lay back down after sitting up for 10 mins, with therapist providing redirection.  Oxygen sats at 95% on 3Ls nasal cannula with dyspnea 3/4.  Finished session with transfer back to supine for resting in preparation for next session with SLP.  Call button and phone in reach with bed alarm in place.    Therapy Documentation Precautions:  Precautions Precautions: Fall, Other (comment) Precaution Comments: supplemental O2, L side inattention, L visual field deficits, left side sensory deficits, left hemiparesis, pusher to the left Restrictions Weight Bearing Restrictions: No LLE Weight Bearing:  (no WB precautions)  Pain: Pain Assessment Pain Scale: Faces Pain Score: 0-No pain ADL: See Care Tool Section for some details of mobility and selfcare  Therapy/Group: Individual Therapy  Shekia Kuper OTR/L 09/09/2019, 12:15 PM

## 2019-09-10 ENCOUNTER — Ambulatory Visit (HOSPITAL_COMMUNITY): Payer: Medicare Other | Admitting: Physical Therapy

## 2019-09-10 ENCOUNTER — Encounter (HOSPITAL_COMMUNITY): Payer: Medicare Other | Admitting: Occupational Therapy

## 2019-09-10 ENCOUNTER — Encounter (HOSPITAL_COMMUNITY): Payer: Medicare Other | Admitting: Speech Pathology

## 2019-09-10 NOTE — Progress Notes (Signed)
[place] PHYSICAL MEDICINE & REHABILITATION PROGRESS NOTE   Subjective/Complaints:  Swallow improved , upgraded to thin liq Still received IVF yesterday after BUN climbed to 46   ROS:   Pt denies SOB, abd pain, CP, N/V/C/D, and vision changes   Objective:   DG Swallowing Func-Speech Pathology  Result Date: 09/09/2019 Objective Swallowing Evaluation: Type of Study: MBS-Modified Barium Swallow Study  Patient Details Name: Caitlin Vasquez MRN: [id number] Date of Birth: 1945/03/29 Today's Date: 09/09/2019 Past Medical History: Past Medical History: Diagnosis Date . COPD (chronic obstructive pulmonary disease) (HCC)  . High cholesterol  . On home oxygen therapy   "2L; all the time" (04/06/2016) . Pneumonia   "when I was a kid"  . Urinary hesitancy  Past Surgical History: Past Surgical History: Procedure Laterality Date . ABDOMINAL AORTOGRAM N/A 04/08/2016  Procedure: Abdominal Aortogram;  Surgeon: Sherren Kerns, MD;  Location: [facility] INVASIVE CV LAB;  Service: Cardiovascular;  Laterality: N/A; . CARDIAC CATHETERIZATION N/A 11/28/2014  Procedure: Right/Left Heart Cath and Coronary Angiography;  Surgeon: [name], MD;  Location: [facility] INVASIVE CV LAB;  Service: Cardiovascular;  Laterality: N/A; . CARDIAC CATHETERIZATION  08/2006  Hattie Perch 08/25/2006 . DRESSING CHANGE UNDER ANESTHESIA Left 04/12/2016  Procedure: DRESSING CHANGE LEFT HIP AND LEFT HEEL;  Surgeon: Fransisco Hertz, MD;  Location: [facility] OR;  Service: Vascular;  Laterality: Left; . FEMORAL-POPLITEAL BYPASS GRAFT Left 04/12/2016  Procedure: LEFT FEMORAL-POPITEAL  BYPASS GRAFT;  Surgeon: Fransisco Hertz, MD;  Location: [facility] OR;  Service: Vascular;  Laterality: Left; . JOINT REPLACEMENT   . LOWER EXTREMITY ANGIOGRAPHY Bilateral 04/08/2016  Procedure: Lower Extremity Angiography;  Surgeon: Sherren Kerns, MD;  Location: [facility] INVASIVE CV LAB;  Service: Cardiovascular;  Laterality: Bilateral; . PERIPHERAL VASCULAR INTERVENTION Bilateral 04/08/2016  Procedure: Peripheral  Vascular Intervention;  Surgeon: Sherren Kerns, MD;  Location: [facility] INVASIVE CV LAB;  Service: Cardiovascular;  Laterality: Bilateral;  common iliac . TOTAL HIP ARTHROPLASTY Left 01/29/2016 . VEIN HARVEST Left 04/12/2016  Procedure: LEFT GREATER SAPHENOUS VEIN HARVEST;  Surgeon: Fransisco Hertz, MD;  Location: [facility] OR;  Service: Vascular;  Laterality: Left; HPI: Pt is a 74 y.o. female with medical history significant of COPD, Afib; PVD s/p L fem-pop bypass and B CIA stenting; and HLD.  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 She was admitted to rehab on 7/6 with ongoing mild AMS, left inattention, LUE weakness, and tendency to "slump to the left."  She developed acute SOB on 7/7 with sats 75% on 3L [place] O2. CXR 7/7: Bilateral interstitial opacity, likely edema superimposed on the patient's emphysema. CT head: Interval evolution of large right PCA territory infarct involving the parasagittal temporal lobe, occipital lobe, and right basal ganglia. Pt admitted to [facility] 08/19/19.  Assessment / Plan / Recommendation CHL IP CLINICAL IMPRESSIONS 09/09/2019 Clinical Impression --Pt demonstrates noteworthy improvements in oropharyngeal swallow function today in comparison to previous MBSS's (conducted 08/12/19 and 08/29/19). No penetration or aspiration was observed across any liquid or solid textures. Her swallow initiation still occurs at the level of the pyriform sinuses, however no pooling is occurring and delay is much more mild in comparison to previous studies. There is also no longer a functional impact of delayed initiation, as her airway protection remained fully in tact across trials. Use of a straw did no impact swallow function. Due to lingual/palatal residue, which later falls into the pharynx and results and vallecular residue, pt does need  to utilize an extra dry swallow. She demonstrated ability to follow commands to perform volitional dry swallow with  100% accuracy, and it was effective to clear the residue, which will further minimize her aspiration risk. Reduced bolus cohesion and posterior propulsion of thin with barium pill noted, and barium pill ultimately expectorated. Although some lingual pumping occurred when she consume barium pill with applesauce, deglutition was functional and no airway intrusion noted. Although SLP cannot diagnose below the level of the upper esophageal sphincter, a brief scans of the esophagus revealed pill and applesauce stagnant in esophagus, which may warrant further investigation from GI perspective if pt is experiencing symptoms. Recommend pt continue dysphagia 3 (mechnical soft) solid textures, upgrade to thin liquids using slow rate and extra dry swallows, remain seated upright at least 30-60 mins after intake, please provide full supervision during meals to cue for swallow strategies. Medications may be administered one at a time whole in applesauce. ST will continue to provide skilled interventions for dysphagia while inpatient in order to ensure diet safety and efficiency and promote more independence with use of her swallow strategies.  SLP Visit Diagnosis Dysphagia, oropharyngeal phase (R13.12) Attention and concentration deficit following -- Frontal lobe and executive function deficit following -- Impact on safety and function Mild aspiration risk   CHL IP TREATMENT RECOMMENDATION 08/15/2019 Treatment Recommendations Therapy as outlined in treatment plan below   Prognosis 08/15/2019 Prognosis for Safe Diet Advancement Good Barriers to Reach Goals Cognitive deficits Barriers/Prognosis Comment -- CHL IP DIET RECOMMENDATION 09/09/2019 SLP Diet Recommendations Dysphagia 3 (Mech soft) solids;Thin liquid Liquid Administration via Cup;Straw Medication Administration Whole meds with puree Compensations Slow rate;Small sips/bites;Multiple dry swallows after each bite/sip Postural Changes Remain semi-upright after after feeds/meals  (Comment);Seated upright at 90 degrees   CHL IP OTHER RECOMMENDATIONS 09/09/2019 Recommended Consults -- Oral Care Recommendations Oral care BID Other Recommendations --   CHL IP FOLLOW UP RECOMMENDATIONS 08/19/2019 Follow up Recommendations Inpatient Rehab   CHL IP FREQUENCY AND DURATION 08/15/2019 Speech Therapy Frequency (ACUTE ONLY) min 2x/week Treatment Duration 2 weeks      CHL IP ORAL PHASE 09/09/2019 Oral Phase Impaired Oral - Pudding Teaspoon -- Oral - Pudding Cup -- Oral - Honey Teaspoon -- Oral - Honey Cup -- Oral - Nectar Teaspoon -- Oral - Nectar Cup Decreased bolus cohesion;Piecemeal swallowing;Lingual/palatal residue;Delayed oral transit Oral - Nectar Straw NT Oral - Thin Teaspoon -- Oral - Thin Cup Left anterior bolus loss;Decreased bolus cohesion;Piecemeal swallowing;Lingual/palatal residue Oral - Thin Straw Decreased bolus cohesion;Piecemeal swallowing;Lingual/palatal residue Oral - Puree WFL Oral - Mech Soft NT Oral - Regular -- Oral - Multi-Consistency -- Oral - Pill Delayed oral transit;Decreased bolus cohesion;Reduced posterior propulsion;Lingual pumping Oral Phase - Comment --  CHL IP PHARYNGEAL PHASE 09/09/2019 Pharyngeal Phase Impaired Pharyngeal- Pudding Teaspoon -- Pharyngeal -- Pharyngeal- Pudding Cup -- Pharyngeal -- Pharyngeal- Honey Teaspoon -- Pharyngeal -- Pharyngeal- Honey Cup -- Pharyngeal -- Pharyngeal- Nectar Teaspoon -- Pharyngeal -- Pharyngeal- Nectar Cup Pharyngeal residue - valleculae Pharyngeal Material does not enter airway Pharyngeal- Nectar Straw NT Pharyngeal -- Pharyngeal- Thin Teaspoon -- Pharyngeal -- Pharyngeal- Thin Cup Delayed swallow initiation-pyriform sinuses;Pharyngeal residue - valleculae;Compensatory strategies attempted (with notebox) Pharyngeal Material does not enter airway Pharyngeal- Thin Straw Delayed swallow initiation-pyriform sinuses;Pharyngeal residue - valleculae;Compensatory strategies attempted (with notebox) Pharyngeal Material does not enter airway  Pharyngeal- Puree WFL Pharyngeal -- Pharyngeal- Mechanical Soft NT Pharyngeal -- Pharyngeal- Regular -- Pharyngeal -- Pharyngeal- Multi-consistency -- Pharyngeal -- Pharyngeal- Pill WFL Pharyngeal -- Pharyngeal Comment --  CHL IP CERVICAL ESOPHAGEAL PHASE 09/09/2019 Cervical Esophageal Phase WFL Pudding Teaspoon -- Pudding Cup -- Honey Teaspoon -- Honey Cup -- Nectar Teaspoon -- Nectar Cup -- Nectar Straw -- Thin Teaspoon -- Thin Cup -- Thin Straw -- Puree -- Mechanical Soft -- Regular -- Multi-consistency -- Pill -- Cervical Esophageal Comment -- [place] E Smith 09/09/2019, 10:44 AM              Recent Labs    09/09/19 0601  WBC 8.2  HGB 11.8*  HCT 37.6  PLT 217   Recent Labs    09/09/19 0601  NA 143  K 4.9  CL 108  CO2 25  GLUCOSE 106*  BUN 46*  CREATININE 1.24*  CALCIUM 9.2    Intake/Output Summary (Last 24 hours) at 09/10/2019 0815 Last data filed at 09/10/2019 0700 Gross per 24 hour  Intake 1279.79 ml  Output 400 ml  Net 879.79 ml     Physical Exam: Vital Signs Blood pressure (!) 124/56, pulse 68, temperature 98.6 F (37 C), resp. rate 20, weight 54.3 kg, SpO2 99 %.  General: No acute distress Mood and affect are appropriate Heart: Regular rate and rhythm no rubs murmurs or extra sounds Lungs: Clear to auscultation, breathing unlabored, no rales or wheezes Abdomen: Positive bowel sounds, soft nontender to palpation, nondistended Extremities: No clubbing, cyanosis, or edema Skin: No evidence of breakdown, no evidence of rash  Neurologic: Cranial nerves II through XII intact, motor strength is 5/5 in RIght and 4/5 left deltoid, bicep, tricep, grip, hip flexor, knee extensors, ankle dorsiflexor and plantar flexor Sensory ataxia LUE and LLE  Sensory exam absent LUE and LLE  Cerebellar exam normal finger to nose to finger as well as heel to shin in bilateral upper and lower extremities Musculoskeletal: Full range of motion in all 4 extremities. No joint  swelling   Assessment/Plan: 1. Functional deficits secondary to cerebellar hemorrhage which require 3+ hours per day of interdisciplinary therapy in a comprehensive inpatient rehab setting.  Physiatrist is providing close team supervision and 24 hour management of active medical problems listed below.  Physiatrist and rehab team continue to assess barriers to discharge/monitor patient progress toward functional and medical goals  Care Tool:  Bathing  Bathing activity did not occur: Refused Body parts bathed by patient: Left arm, Chest, Abdomen, Right upper leg, Left upper leg, Face, Right arm, Right lower leg   Body parts bathed by helper: Right lower leg, Left lower leg, Right arm, Buttocks, Front perineal area     Bathing assist Assist Level: Moderate Assistance - Patient 50 - 74%     Upper Body Dressing/Undressing Upper body dressing   What is the patient wearing?: Pull over shirt    Upper body assist Assist Level: Maximal Assistance - Patient 25 - 49%    Lower Body Dressing/Undressing Lower body dressing      What is the patient wearing?: Pants, Incontinence brief     Lower body assist Assist for lower body dressing: Total Assistance - Patient < 25%     Toileting Toileting    Toileting assist Assist for toileting: Total Assistance - Patient < 25%     Transfers Chair/bed transfer  Transfers assist  Chair/bed transfer activity did not occur: Safety/medical concerns  Chair/bed transfer assist level: Maximal Assistance - Patient 25 - 49%     Locomotion Ambulation   Ambulation assist   Ambulation activity did not occur: Safety/medical concerns          Walk  10 feet activity   Assist  Walk 10 feet activity did not occur: Safety/medical concerns        Walk 50 feet activity   Assist Walk 50 feet with 2 turns activity did not occur: Safety/medical concerns         Walk 150 feet activity   Assist Walk 150 feet activity did not occur:  Safety/medical concerns         Walk 10 feet on uneven surface  activity   Assist Walk 10 feet on uneven surfaces activity did not occur: Safety/medical concerns         Wheelchair     Assist Will patient use wheelchair at discharge?: No (No Long term goals set by PT)             Wheelchair 50 feet with 2 turns activity    Assist            Wheelchair 150 feet activity     Assist          Blood pressure (!) 124/56, pulse 68, temperature 98.6 F (37 C), resp. rate 20, weight 54.3 kg, SpO2 99 %.  Medical Problem List and Plan: 1.  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  Antithrombotics: -DVT/anticoagulation:  Mechanical: Sequential compression devices, below knee Bilateral lower extremities  Vascular ultrasound negative for DVT             -antiplatelet therapy: N/a due to bleed.  3. Pain Management: Back pain resolved with treatment of UTI. Discontinued hydrocodone and will use tramadol or tylenol prn for now.   8/1- pt denies pain- con't prns 4. Anxiety disorder/Mood:Neuropsych     limit sedating meds  -antipsychotic agents: N/a 5. Neuropsych: This patient is not fully capable of making decisions on her own behalf. 6. Skin/Wound Care:  Routine pressure relief measures.  7. Fluids/Electrolytes/Nutrition: Strict I/Os. Meal intake 50-75% 8. NSTEMI with fluid overload: Treated medically with Coreg, Lisinopril and Lipitor.  9. Severe COPD: Encourage pulmonary hygiene--continue flutter. Wean oxygen to off as able as has not use it for past year per son. Albuterol MDI prn.  Discussed with nursing to maintain oxygen sats in high 80s/low 90s, avoid 100%  7/31- sats 78% this afternoon- no calls about issues- will check with nursing and double check it's OK- sounded great this AM  8/1- back up to 97% with O2 this AM- will  need to wean O2 based on need  Continues to require supplemental oxygen prn 10. E coli UTI: Has completed 7 day antibiotic course on 07/11.  11. HTN: SBP goal <160. Monitor BP --on Norvasc and HCTZ.  Vitals:   09/09/19 1933 09/10/19 0500  BP: 122/61 (!) 124/56  Pulse: 81 68  Resp: (!) 21 20  Temp: 98.7 F (37.1 C) 98.6 F (37 C)  SpO2: 100% 99%  will d/c HCTZ due to poor fluid intake with diuretic use BP normal12. Lethargy: improved, mainly CVA related  13. Urinary retention with incontinence: Continue monitor voiding with PVR checks/bladder scans 4-6 hours and I/O caht to keep volumes <300 cc.   Flomax started no catheterization for over 2 days  14. A fib: Not AC candidate due to hemorrhage.  Rate controlled. 15.  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  Advance diet as tolerated 16. Tachypnea:   COPD  Improved 17.  Bowel incont  Poor  awareness 18.  Elevated BUN/creatinine  Holding IVF, repeat BUN Cr elevated Restart IVF- D/C HCTZ    LOS: 22 days A FACE TO FACE EVALUATION WAS PERFORMED  Erick Colace 09/10/2019, 8:15 AM

## 2019-09-10 NOTE — Progress Notes (Addendum)
Physical Therapy Discharge Summary  Patient Details  Name: Caitlin Vasquez MRN: [id number] Date of Birth: 1945/06/30   Patient has met 0 of 8 long term goals despite the following improvements: improved activity tolerance, improved balance, improved postural control, increased strength, ability to compensate for deficits, functional use of  left upper extremity and left lower extremity, improved attention, improved awareness and improved coordination.  Patient to discharge at a wheelchair level Max Assist   Patient's care partner is available to provide the necessary physical and cognitive assistance at discharge.  Reasons goals not met: Patient with very minimal progress during CIR stay due to significant impairments in cardiovascular endurance, L inattention, L hemiparesis, absent L UE and L LE sensation, impaired midline awareness, delayed initiation, impaired motor planning, and pusher tendencies. Due to this, pt requires inconsistent levels of assistance with mobility often up to mod/max assist.   Recommendation:  Patient will benefit from ongoing skilled PT services in home health setting to continue to advance safe functional mobility, address ongoing impairments in L hemiparesis, trunk control, endurance, transfer training, and minimize fall risk.  Equipment: hospital bed and custom wheelchair  Reasons for discharge: lack of progress toward goals and discharge from hospital   Patient/family agrees with progress made and goals achieved: Yes  PT Discharge Precautions/Restrictions Precautions Precautions: Fall;Other (comment) Precaution Comments: supplemental O2, L side inattention, L visual field deficits, left side sensory deficits, left hemiparesis, pusher to the left Restrictions Weight Bearing Restrictions: No Pain Pain Assessment Pain Scale: 0-10 Pain Score: 0-No pain Perception  Perception Perception: Impaired Inattention/Neglect: Does not attend to left side of  body;Does not attend to left visual field Spatial Orientation: impaired midline orientation with pusher tendencies Praxis Praxis: Impaired Praxis Impairment Details: Ideomotor;Motor planning  Cognition Overall Cognitive Status: Impaired/Different from baseline Orientation Level: Oriented to person;Oriented to place;Oriented to situation Attention: Focused;Sustained Focused Attention: Appears intact Sustained Attention: Impaired Sustained Attention Impairment: Verbal basic;Functional basic Memory: Impaired Memory Impairment: Decreased recall of new information;Decreased short term memory Decreased Short Term Memory: Verbal basic;Functional basic Awareness Impairment: Intellectual impairment Problem Solving: Impaired Problem Solving Impairment: Functional basic;Verbal basic Sequencing: Impaired Sequencing Impairment: Verbal basic;Functional basic Safety/Judgment: Impaired Comments: Pt with decreased awareness of the magnitude of her current deficits. Sensation Sensation Light Touch: Impaired Detail Light Touch Impaired Details: Absent LUE;Absent LLE Hot/Cold: Not tested Proprioception: Impaired Detail Proprioception Impaired Details: Absent LUE;Absent LLE Stereognosis: Not tested Coordination Gross Motor Movements are Fluid and Coordinated: No Coordination and Movement Description: impaired due to L hemipareiss, absent L UE and L LE sensation, impaired midline orientaiton, poor trunk control, and L inattention Heel Shin Test: impaired on L LE due to paresis and impaired proprioception Motor  Motor Motor: Hemiplegia;Abnormal postural alignment and control;Motor apraxia;Other (comment) Motor - Discharge Observations: L hemiparesis, absent L hemibody sensation, impaired midline orientation  Mobility  Bed Mobility Bed Mobility: Supine to Sit;Sit to Supine Supine to Sit: Moderate Assistance - Patient 50-74% Sit to Supine: Moderate Assistance - Patient 50-74% Transfers Transfers:  Set designer Transfers;Sit to Stand;Stand to Sit Sit to Stand: Moderate Assistance - Patient 50-74%;Maximal Assistance - Patient 25-49% Stand to Sit: Moderate Assistance - Patient 50-74%;Maximal Assistance - Patient 25-49% Squat Pivot Transfers: Moderate Assistance - Patient 50-74%;Maximal Assistance - Patient 25-49% Transfer (Assistive device): None Locomotion  Gait Ambulation: No Gait Gait: No Stairs / Additional Locomotion Stairs: No Wheelchair Mobility Wheelchair Mobility: No  Trunk/Postural Assessment  Cervical Assessment Cervical Assessment: Exceptions to [facility] (significant cervical protraction, R gaze preference) Thoracic  Assessment Thoracic Assessment: Exceptions to [facility] (significant thoracic kyphosis) Lumbar Assessment Lumbar Assessment: Exceptions to [facility] (posterior pelvic tilt in sitting) Postural Control Postural Control: Deficits on evaluation Trunk Control: impaired midline orientation and poor trunk control with pusher tendencies causing mild L anterior lean/LOB Righting Reactions: delayed and sufficent for minor LOB but requires assist for more moderate LOB Protective Responses: delayed and sufficient for minor LOB Postural Limitations: significantly decreased  Balance Balance Balance Assessed: Yes Static Sitting Balance Static Sitting - Balance Support: Feet supported;Right upper extremity supported Static Sitting - Level of Assistance: 4: Min assist Dynamic Sitting Balance Dynamic Sitting - Balance Support: Feet supported Dynamic Sitting - Level of Assistance: 2: Max assist Extremity Assessment      RLE Assessment RLE Assessment: Exceptions to [facility] Active Range of Motion (AROM) Comments: WFL General Strength Comments: below strength assessed informally during functional mobility RLE Strength Right Hip Flexion: 3+/5 Right Knee Flexion: 3+/5 Right Knee Extension: 4/5 Right Ankle Dorsiflexion: 3+/5 Right Ankle Plantar Flexion: 3+/5 LLE Assessment Active  Range of Motion (AROM) Comments: WFL General Strength Comments: below strength assessed informally during functional mobility LLE Strength Left Hip Flexion: 3/5 Left Knee Flexion: 3-/5 Left Knee Extension: 3/5 Left Ankle Dorsiflexion: 2+/5 Left Ankle Plantar Flexion: 2+/5    Tawana Scale , PT, DPT, CSRS Breck Coons, PT, DPT  09/10/2019, 4:50 PM

## 2019-09-10 NOTE — Plan of Care (Signed)
  Problem: Consults Goal: RH STROKE PATIENT EDUCATION Description: See Patient Education module for education specifics  Outcome: Progressing   Problem: RH BOWEL ELIMINATION Goal: RH STG MANAGE BOWEL WITH ASSISTANCE Description: STG Manage Bowel with mod Assistance. Outcome: Progressing Goal: RH STG MANAGE BOWEL W/MEDICATION W/ASSISTANCE Description: STG Manage Bowel with Medication with mod I Assistance. Outcome: Progressing   Problem: RH BLADDER ELIMINATION Goal: RH STG MANAGE BLADDER WITH ASSISTANCE Description: STG Manage Bladder With mod Assistance Outcome: Progressing   Problem: RH SKIN INTEGRITY Goal: RH STG SKIN FREE OF INFECTION/BREAKDOWN Description: No additional breakdown will occur while iin rehab with mod assistance  Outcome: Progressing Goal: RH STG MAINTAIN SKIN INTEGRITY WITH ASSISTANCE Description: STG Maintain Skin Integrity With mod Assistance. Outcome: Progressing   Problem: RH SAFETY Goal: RH STG ADHERE TO SAFETY PRECAUTIONS W/ASSISTANCE/DEVICE Description: STG Adhere to Safety Precautions With mod Assistance/Device. Outcome: Progressing   Problem: RH PAIN MANAGEMENT Goal: RH STG PAIN MANAGED AT OR BELOW PT'S PAIN GOAL Description: Pt will report pain of less than 4 with min assistance Outcome: Progressing   Problem: RH KNOWLEDGE DEFICIT Goal: RH STG INCREASE KNOWLEDGE OF HYPERTENSION Description: Family will review stroke book and be able to identify factors related to hypertension with min assist Outcome: Progressing Goal: RH STG INCREASE KNOWLEDGE OF STROKE PROPHYLAXIS Description: Family will be able to identify stroke prophylaxis with min assist Outcome: Progressing   Problem: RH KNOWLEDGE DEFICIT Goal: RH STG INCREASE KNOWLEGDE OF HYPERLIPIDEMIA Description: Family will be able to identify factors related to HLD  and secondary stroke, dietary restrictions and medication management for with min assist  Outcome: Progressing   Problem: RH  Vision Goal: RH LTG Vision (Specify) Outcome: Progressing   

## 2019-09-10 NOTE — Progress Notes (Signed)
Physical Therapy Session Note  Patient Details  Name: Caitlin Vasquez MRN: [id number] Date of Birth: 07/09/1945  Today's Date: 09/10/2019 PT Individual Time: 1519-1620 PT Individual Time Calculation (min): 61 min   Short Term Goals: Week 3:  PT Short Term Goal 1 (Week 3): = to LTGs based on ELOS  Skilled Therapeutic Interventions/Progress Updates:    Pt received supine in bed with no family present for education/training - per nursing, family left to go home. Therapist attempted to call pt's 2 sons but no answer. Pt agreeable to therapy session and Jason, [employer] present to assist with wheelchair modifications. Pt performs supine>sit R EOB with mod assist for BLE management and trunk upright throughout session. And sit>supine with mod assist for B LE management into bed. L squat pivot EOB>w/c x2 with mod/max assist for lifting/pivoting hips and cuing for sequencing with head/hips relationship as pt continues to have L anterior trunk lean. R squat pivot transfers w/c>EOB x2 with mod/max assist for lifting/pivoting hips - continued cuing for sequencing. Based on this therapist's prior conversations with pt's son he prefers a wheelchair that can be transported in his sedan to get pt to/from MD appointments despite education that pt has not yet progressed to performing car transfers - Barbara Cower, [employer] has provided a 16x18inch lightweight manual wheelchair with visco back and cushion to trial that would fit in a vehicle. Once in w/c, pt noticed to be in improved upright posture but back angle needed adjustment to allow pt to sit further back in chair and modified seat for 18inch depth as well as raised back support for better placement on spine - attempted wheelchair in "dumped" positioning to allow improved safety during transport but this causes worsening posterior pelvic tilt and poorer sitting posture/alignment. Recommending the following modifications still be made to current wheelchair: 18inch height visco back, L  1/2 lap tray, and change visco cushion to 16x18inch Barbara Cower planning to provide these modifications. Therapist continues to educate pt on recommendation for a TIS wheelchair that would allow proper pressure relief and even further improved postural support - will discuss further with pt's son. Pt left supine in bed with needs in reach and bed alarm on.  Therapist called and spoke with pt's son, Sharia Reeve, regarding the following: - plan for hands-on training/education tomorrow due to being unable to perform today - the current trial manual wheelchair in pt's room with the performed and planned modifications for improved trunk alignment and pressure relief - continued to educate that pt is not currently at a mobility level to perform car transfers and may need medical transport to/from MD appointments upon discharge therefore therapist is recommending TIS w/c that will allow adequate pressure relief as opposed to the lightweight standard manual chair - pt's son planning to process this information and discuss further with therapy tomorrow   Therapy Documentation Precautions:  Precautions Precautions: Fall, Other (comment) Precaution Comments: supplemental O2, L side inattention, L visual field deficits, left side sensory deficits, left hemiparesis, pusher to the left Restrictions Weight Bearing Restrictions: No LLE Weight Bearing:  (no WB precautions)  Pain: No reports of pain throughout session.   Therapy/Group: Individual Therapy  Ginny Forth , PT, DPT, CSRS  09/10/2019, 3:12 PM

## 2019-09-10 NOTE — Progress Notes (Signed)
Speech Language Pathology Daily Session Note  Patient Details  Name: Caitlin Vasquez MRN: [id number] Date of Birth: 12/23/1945  Today's Date: 09/10/2019 SLP Individual Time: 1410-1455 SLP Individual Time Calculation (min): 45 min  Short Term Goals: Week 3: SLP Short Term Goal 1 (Week 3): STG=LTG due to remaining LOS  Skilled Therapeutic Interventions: Pt was seen for skilled ST targeting education with pt's sons (caregivers). SLP facilitated session with verbal review of ST goals and progress while inpatient. Review and handouts provided regarding current diet recommendations (dysphagia 3 mech soft solids and thin liquids) as well as compensatory swallow strategies (extra dry swallow, slow rate, 1 bite/sip at a time, rest breaks as needed, minimize distractions). Also discussed appropriate cueing for use of those swallow strategies due to decreased recall. Verbal review and handout also provided for compensatory memory strategies to implement in the home. Impact of other cognitive deficits including decreased problem solving, attention, and left visual inattention also noted. Reinforced recommendation for 24/7 supervision at home and total assist with tasks such as medication and finance management and cooking. Pt's sons in agreement and confident they can provide appropriate level of assist. Pt left sitting in bed with alarm on and sons providing full supervision of pt consuming yogurt snack. Continue per current plan of care.          Pain Pain Assessment Pain Scale: 0-10 Pain Score: 0-No pain  Therapy/Group: Individual Therapy  Little Ishikawa 09/10/2019, 3:18 PM

## 2019-09-10 NOTE — Progress Notes (Deleted)
Physical Therapy Note  Patient Details  Name: Caitlin Vasquez MRN: [id number] Date of Birth: 1945-08-19 Today's Date: 09/10/2019   Recommending the following equipment to increase functional independence with mobility: ~ 16x18inch lightweight wheelchair with removable leg rests   Patient measurements:    Hip Width:     Seat Depth (-2"): 18 inches    Back Height: 18inch Visco back   W/c cushion: 16x18inch Visco cushion to provide improved pelvic alignment and pressure relief     Ginny Forth , PT, DPT, CSRS  09/10/2019, 5:49 PM

## 2019-09-10 NOTE — Progress Notes (Addendum)
Physical Therapy Note  Patient Details  Name: Caitlin Vasquez MRN: [id number] Date of Birth: December 01, 1945 Today's Date: 09/10/2019    Recommending the following equipment to increase functional independence with mobility: ~ 16x18inch manual lightweight wheelchair with removable leg rests   Patient measurements:    Hip Width: 16inches    Seat Depth (-2"): 18inches    Back Height: 18inch Visco back to provide improved trunk alignment for respiratory function    W/c cushion: 16x18inch Visco cushion to provide improved pelvic alignment and pressure relief  Ginny Forth , PT, DPT, CSRS Barbie Banner, PT, DPT  09/10/2019, 4:13 PM

## 2019-09-10 NOTE — Progress Notes (Signed)
Occupational Therapy Session Note  Patient Details  Name: Caitlin Vasquez MRN: [id number] Date of Birth: 04-22-1945  Today's Date: 09/10/2019 OT Individual Time: 1300-1409 OT Individual Time Calculation (min): 69 min    Short Term Goals: Week 3:  OT Short Term Goal 1 (Week 3): STG=LTG OT Short Term Goal 2 (Week 3): Family education  Skilled Therapeutic Interventions/Progress Updates:    Pt's sons in for session Jill Alexanders and [place]).  Pt in bed to start, worked on education for rolling in the bed in order to change brief and donn LB clothing.  She needed supervision for rolling to the left with max assist for rolling to the right.  Educated pt and sons on sequence for rolling to the left as well as hand positioning at the shoulder blade and hips to assist with rolling.  Pt with noted bladder incontinence in her brief, so worked on washing her peri area and donning new brief.  He was able to wash her front peri area with setup and therapist provided total assist for washing her buttocks and donning new brief.  Her son Sharia Reeve assisted with rolling in the bed for donning pants in supine with overall total assist as well.  Had her son assist with supine to sit EOB at max assist as well as completing eduction on donning a pullover shirt.  Pt needed max assist for sitting balance with Jill Alexanders assisting while therapist and Josh assisted with donning her pullover shirt.  Once completed, she was able to perform a squat pivot transfer to the wheelchair with max assist.  Finished session with completion of squat pivot transfer back to the bed to rest in preparation for PT session later.  Pt's son Sharia Reeve plans to come back tomorrow for more education, which this therapist recommends.    Therapy Documentation Precautions:  Precautions Precautions: Fall, Other (comment) Precaution Comments: supplemental O2, L side inattention, L visual field deficits, left side sensory deficits, left hemiparesis, pusher to the  left Restrictions Weight Bearing Restrictions: No LLE Weight Bearing:  (no WB precautions)  Pain: Pain Assessment Pain Scale: 0-10 Pain Score: 0-No pain ADL: See Care Tool Section for some details of mobility and selfcare  Therapy/Group: Individual Therapy  Cecile Guevara OTR/L 09/10/2019, 4:01 PM

## 2019-09-11 ENCOUNTER — Encounter (HOSPITAL_COMMUNITY): Payer: Medicare Other | Admitting: Speech Pathology

## 2019-09-11 ENCOUNTER — Encounter (HOSPITAL_COMMUNITY): Payer: Medicare Other | Admitting: Occupational Therapy

## 2019-09-11 ENCOUNTER — Inpatient Hospital Stay (HOSPITAL_COMMUNITY): Payer: Medicare Other | Admitting: Occupational Therapy

## 2019-09-11 ENCOUNTER — Ambulatory Visit (HOSPITAL_COMMUNITY): Payer: Medicare Other

## 2019-09-11 LAB — BASIC METABOLIC PANEL
Anion gap: 16 — ABNORMAL HIGH (ref 5–15)
BUN: 41 mg/dL — ABNORMAL HIGH (ref 8–23)
CO2: 16 mmol/L — ABNORMAL LOW (ref 22–32)
Calcium: 8.9 mg/dL (ref 8.9–10.3)
Chloride: 111 mmol/L (ref 98–111)
Creatinine, Ser: 1.25 mg/dL — ABNORMAL HIGH (ref 0.44–1.00)
GFR calc Af Amer: 49 mL/min — ABNORMAL LOW (ref >60)
GFR calc non Af Amer: 42 mL/min — ABNORMAL LOW (ref >60)
Glucose, Bld: 99 mg/dL (ref 70–99)
Potassium: 4.3 mmol/L (ref 3.5–5.1)
Sodium: 143 mmol/L (ref 135–145)

## 2019-09-11 NOTE — Patient Care Conference (Signed)
Inpatient RehabilitationTeam Conference and Plan of Care Update Date: 09/11/2019   Time: 10:50   Patient Name: Caitlin Vasquez      Medical Record Number: [id number]  Date of Birth: 1945/02/26 Sex: Female         Room/Bed: 4W16C/4W16C-01 Payor Info: Payor: MEDICARE / Plan: MEDICARE PART A AND B / Product Type: *No Product type* /    Admit Date/Time:  08/19/2019  3:23 PM  Primary Diagnosis:  Cerebellar hemorrhage [facility])  Hospital Problems: Principal Problem:   Cerebellar hemorrhage (HCC) Active Problems:   Tachypnea   Urinary retention   Labile blood pressure   Dyspnea   Urinary incontinence   Incontinence of feces   Chronic obstructive pulmonary disease (HCC)   Elevated BUN   Labile blood glucose    Expected Discharge Date: Expected Discharge Date: 09/12/19  Team Members Present: Physician leading conference: Dr. Claudette Laws Care Coodinator Present: Chana Bode, RN, BSN, CRRN;Christina Vita Barley, BSW Nurse Present: Other (comment) Roseanne Reno, RN) PT Present: Casimiro Needle, PT OT Present: Perrin Maltese, OT SLP Present: Suzzette Righter, CF-SLP PPS Coordinator present : Edson Snowball, [place]ed, SLP     Current Status/Progress Goal Weekly Team Focus  Bowel/Bladder   Incontinent of bowel and bladder. Requiring Q6 PVRs.  Regain continence of bowel and bladder  Q2 toileting   Swallow/Nutrition/ Hydration   Dys 3, upgraded to thin after MBSS 8/2, full supervision  Supervision A  independence with swallow stategies, education, tolerance upgraded diet   ADL's   Mod assist for UB bathing in supported sitting, max assist for LB bathing supine to sit.  Max assist for UB dressing with total assist for LB dressing.  Still total assist +2 for toileting tasks.  min to mod assist  selfcare retraining, transfer training, NMR, pt education, balance retraining, DME education   Mobility   mod assist bed mobility, continues with L anterior trunk lean in unsupported sitting  due to pusher tendencies and impaired midline awareness, mod/max assist bed<>chair transfers, no further progression towards ambulation  min assist overall at transfer level mod assist ambulation.  activity tolerance, bed mobility, midline orientation, sitting balance/trunk control, L LE strengthening, transfer training, pt/family education, wheelchair consultation, DME education, discharge planning   Communication             Safety/Cognition/ Behavioral Observations  Min-Mod  Min-Mod  visual scanning, functional problem solving, attention, awareness, recall, education   Pain   No complaints of pain at this time  Remain pain free  assess q shift   Skin   MASD to buttocks, bruising to RFA  Promote healing, prevent further breakdown and maintain skin integrity  assess q shft     Discharge Planning:    Home with son(s)  Team Discussion: Tenuous discharge plan; patient with poor motivation, initiation, endurance, and decreased visual scanning deficits, poor postural control and left neglect. Patient is incontinent and pushes with activities. Patient on target to meet rehab goals: no, goals were downgraded and son(s) are aware of current level of function and pursuing home with [facility] services  *See Care Plan and progress notes for long and short-term goals.   Revisions to Treatment Plan:  Specialized wheelchair ordered to address postural control and coordination when up in chair Teaching Needs: Toileting, bathing, dressing, medications, and swallowing cues reviewed with sons during family education sessions  Current Barriers to Discharge: Decreased care giver Incontinence Pushing, poor coordination, endurance  Possible Resolutions to Barriers: Family education [facility] referral  With [facility]  services follow up     Medical Summary               I attest that I was present, lead the team conference, and concur with the assessment and plan of the team.   Chana Bode B 09/11/2019, 1:36  PM

## 2019-09-11 NOTE — Progress Notes (Signed)
Occupational Therapy Discharge Summary  Patient Details  Name: Caitlin Vasquez MRN: [id number] Date of Birth: 24-May-1945  Today's Date: 09/11/2019 OT Individual Time: 8676-7209 OT Individual Time Calculation (min): 47 min   Session Note:  Pt's son in for family training today.  He was able to assist pt with supine to sit on two occasions with independence and with pt needing mod to max assist.  He also completed 4 bed to wheelchair transfers squat pivot with supervision.  Demonstrational cueing was given for greater awareness of her body position during transfer as well as for positioning of his LEs around the left knee for stability and also his body mechanics to take stress off of his back.  Educated pt's son on scooting forward and backwards to the edge of surfaces as well as to the back of the wheelchair after transfer and he was able to return demonstrate effectively.  Pt was left up in the wheelchair with her son in preparation for more practice with PT next session.    Patient has met 1 of 13 long term goals due to improved activity tolerance, improved balance, postural control, ability to compensate for deficits, functional use of  LEFT upper and LEFT lower extremity, improved attention, improved awareness and improved coordination.  Patient to discharge at [facility] Max Assist level.  Patient's care partner is independent to provide the necessary physical and cognitive assistance at discharge.    Reasons goals not met: Goals not met secondary to pt needing overall max assist or greater for selfcare tasks supine flat to supine with HOB elevated.  She also continues to need max assist for all squat pivot transfers as well as total assist +2 for any selfcare sit to stand or toileting.    Recommendation:  Patient will benefit from ongoing skilled OT services in home health setting to continue to advance functional skills in the area of BADL and Reduce care partner burden. [name] continues to  demonstrate significant impairments with sitting and standing balance,  LUE and LLE functional use, awareness, endurance, as well as vision.  Feel she will continue to benefit from extensive HHOT to further progress ADL function and reduce burden of care on her son as she currently needs max to total assist for selfcare tasks supine as well as for functional transfers squat pivot.   Equipment: drop arm commode, hospital bed, wheelchair  Reasons for discharge: treatment goals met and discharge from hospital  Patient/family agrees with progress made and goals achieved: Yes  OT Discharge Precautions/Restrictions  Precautions Precautions: Fall;Other (comment) Precaution Comments: supplemental O2, L side inattention, L visual field deficits, left side sensory deficits, left hemiparesis, pusher to the left Restrictions Weight Bearing Restrictions: No  Pain Pain Assessment Pain Scale: 0-10 Pain Score: 0-No pain ADL ADL Eating: Supervision/safety Where Assessed-Eating: Wheelchair, Bed level Grooming: Minimal assistance Where Assessed-Grooming: Wheelchair, Bed level Upper Body Bathing: Minimal assistance Where Assessed-Upper Body Bathing: Bed level Lower Body Bathing: Dependent Where Assessed-Lower Body Bathing: Bed level Upper Body Dressing: Maximal assistance Where Assessed-Upper Body Dressing: Wheelchair Lower Body Dressing: Dependent Where Assessed-Lower Body Dressing: Bed level Toileting: Dependent Where Assessed-Toileting: Bed level Toilet Transfer: Maximal assistance Toilet Transfer Method: Squat pivot Toilet Transfer Equipment: Drop arm bedside commode Vision Baseline Vision/History: Wears glasses Wears Glasses: At all times Patient Visual Report: No change from baseline Vision Assessment?: Yes Eye Alignment: Within Functional Limits Ocular Range of Motion: Within Functional Limits Alignment/Gaze Preference: Head turned (head turn to the right) Tracking/Visual Pursuits:  Decreased smoothness of vertical tracking;Decreased smoothness of horizontal tracking;Other (comment) (pt exhibits lost fixation when scanning to the left of midline) Visual Fields: Left visual field deficit Additional Comments: Pt continues to need max instructional cueing for scanning to the left of midline to locate her grooming items or items on her meal tray Perception  Perception: Impaired Inattention/Neglect: Does not attend to left side of body;Does not attend to left visual field Spatial Orientation: impaired midline orientation with pusher tendencies Praxis Praxis Impairment Details: Ideomotor;Motor planning Praxis-Other Comments: Pt still with motor planning deficits as well as motor impersistence deficits.  Pt unable to coordinate LUE functional movement for use with selfcare tasks. Cognition Overall Cognitive Status: Impaired/Different from baseline Orientation Level: Oriented to person;Oriented to place;Oriented to situation Attention: Focused;Sustained Focused Attention: Appears intact Sustained Attention: Impaired Sustained Attention Impairment: Verbal basic;Functional basic Memory: Impaired Memory Impairment: Decreased recall of new information;Decreased short term memory Decreased Short Term Memory: Verbal basic;Functional basic Awareness Impairment: Intellectual impairment Problem Solving: Impaired Problem Solving Impairment: Functional basic;Verbal basic Sequencing: Impaired Sequencing Impairment: Verbal basic;Functional basic Safety/Judgment: Impaired Comments: Pt with decreased awareness of the magnitude of her current deficits. Sensation Sensation Light Touch: Impaired Detail Light Touch Impaired Details: Absent LUE;Absent LLE Hot/Cold: Not tested Proprioception: Impaired Detail Proprioception Impaired Details: Absent LUE;Absent LLE Stereognosis: Not tested Additional Comments: Pt unable to detect light touch, deep pressure, or proprioception in the left arm  or leg Coordination Gross Motor Movements are Fluid and Coordinated: No Fine Motor Movements are Fluid and Coordinated: No Coordination and Movement Description: Pt needs max hand over hand for integration of the LUE into bathing tasks as a stabilizer or gross assist secondary to motor planning deficits and lack of sensory input. Motor  Motor Motor: Hemiplegia;Abnormal postural alignment and control;Motor apraxia;Other (comment);Motor impersistence Motor - Discharge Observations: L hemiparesis, absent L hemibody sensation, impaired midline orientation Mobility  Bed Mobility Bed Mobility: Supine to Sit;Sit to Supine;Rolling Right;Rolling Left Rolling Right: Maximal Assistance - Patient 25-49% Rolling Left: Contact Guard/Touching assist Right Sidelying to Sit: Moderate Assistance - Patient 50-74% Supine to Sit: Moderate Assistance - Patient 50-74% Sit to Supine: Moderate Assistance - Patient 50-74% Sit to Sidelying Right: Moderate Assistance - Patient 50-74% Transfers Sit to Stand: Maximal Assistance - Patient 25-49% Stand to Sit: Moderate Assistance - Patient 50-74%  Trunk/Postural Assessment  Cervical Assessment Cervical Assessment: Exceptions to [facility] (cervical protraction, flexion, and right rotation in sitting) Thoracic Assessment Thoracic Assessment: Exceptions to [facility] (moderate thoracic kyphosis) Lumbar Assessment Lumbar Assessment: Exceptions to [facility] (posterior pelvic tilt)  Balance Balance Balance Assessed: Yes (Simultaneous filing. User may not have seen previous data.) Static Sitting Balance Static Sitting - Balance Support: Feet supported;Right upper extremity supported (Simultaneous filing. User may not have seen previous data.) Static Sitting - Level of Assistance: 4: Min assist (for brief periods  Simultaneous filing. User may not have seen previous data.) Dynamic Sitting Balance Dynamic Sitting - Balance Support: Feet supported (Simultaneous filing. User may not have seen  previous data.) Dynamic Sitting - Level of Assistance: 2: Max assist (Simultaneous filing. User may not have seen previous data.) Static Standing Balance Static Standing - Balance Support: During functional activity Static Standing - Level of Assistance: 2: Max assist Dynamic Standing Balance Dynamic Standing - Balance Support: During functional activity Dynamic Standing - Level of Assistance: 1: +2 Total assist Extremity/Trunk Assessment RUE Assessment RUE Assessment: Within Functional Limits Active Range of Motion (AROM) Comments: WFLS General Strength Comments: strength overall 4/5 noted during functional use  with transfers and ADL tasks LUE Assessment LUE Assessment: Exceptions to [facility] Passive Range of Motion (PROM) Comments: PROM WFLS for all joints with slight flexor tone noted initially in the digits General Strength Comments: Pt currently with Brunnstrum stage IV movement in the arm and hand.  Decreased control noted with max assist needed for integrated use into bathing tasks secondary to sensory loss and apraxia.   Robben Jagiello OTR/L 09/11/2019, 4:51 PM

## 2019-09-11 NOTE — Progress Notes (Addendum)
[facility] PHYSICAL MEDICINE & REHABILITATION PROGRESS NOTE   Subjective/Complaints:  Comfortable on 1 L O2 [place], 1 prong in nose Occ ICP   ROS:   Pt denies SOB, abd pain, CP, N/V/C/D, and vision changes   Objective:   No results found. Recent Labs    09/09/19 0601  WBC 8.2  HGB 11.8*  HCT 37.6  PLT 217   Recent Labs    09/09/19 0601 09/11/19 0522  NA 143 143  K 4.9 4.3  CL 108 111  CO2 25 16*  GLUCOSE 106* 99  BUN 46* 41*  CREATININE 1.24* 1.25*  CALCIUM 9.2 8.9    Intake/Output Summary (Last 24 hours) at 09/11/2019 1052 Last data filed at 09/10/2019 1700 Gross per 24 hour  Intake 100 ml  Output 350 ml  Net -250 ml     Physical Exam: Vital Signs Blood pressure (!) 116/98, pulse 77, temperature (!) 97.4 F (36.3 C), resp. rate 19, weight 54.3 kg, SpO2 93 %.   General: No acute distress Mood and affect are appropriate Heart: Regular rate and rhythm no rubs murmurs or extra sounds Lungs: Clear to auscultation, breathing unlabored, no rales or wheezes Abdomen: Positive bowel sounds, soft nontender to palpation, nondistended Extremities: No clubbing, cyanosis, or edema  Neurologic: Cranial nerves II through XII intact, motor strength is 5/5 in RIght and 4/5 left deltoid, bicep, tricep, grip, hip flexor, knee extensors, ankle dorsiflexor and plantar flexor Sensory ataxia LUE and LLE  Sensory exam absent LUE and LLE   Musculoskeletal: Full range of motion in all 4 extremities. No joint swelling   Assessment/Plan: 1. Functional deficits secondary to cerebellar hemorrhage which require 3+ hours per day of interdisciplinary therapy in a comprehensive inpatient rehab setting.  Physiatrist is providing close team supervision and 24 hour management of active medical problems listed below.  Physiatrist and rehab team continue to assess barriers to discharge/monitor patient progress toward functional and medical goals  Care Tool:  Bathing  Bathing activity  did not occur: Refused Body parts bathed by patient: Left arm, Chest, Abdomen, Right upper leg, Left upper leg, Face, Right arm, Right lower leg   Body parts bathed by helper: Right lower leg, Left lower leg, Right arm, Buttocks, Front perineal area     Bathing assist Assist Level: Moderate Assistance - Patient 50 - 74%     Upper Body Dressing/Undressing Upper body dressing   What is the patient wearing?: Pull over shirt    Upper body assist Assist Level: 2 Helpers    Lower Body Dressing/Undressing Lower body dressing      What is the patient wearing?: Pants, Incontinence brief     Lower body assist Assist for lower body dressing: Total Assistance - Patient < 25% (supine rolling)     Toileting Toileting    Toileting assist Assist for toileting: Total Assistance - Patient < 25% (supine rolling in bed)     Transfers Chair/bed transfer  Transfers assist  Chair/bed transfer activity did not occur: Safety/medical concerns  Chair/bed transfer assist level: Maximal Assistance - Patient 25 - 49%     Locomotion Ambulation   Ambulation assist   Ambulation activity did not occur: Safety/medical concerns          Walk 10 feet activity   Assist  Walk 10 feet activity did not occur: Safety/medical concerns        Walk 50 feet activity   Assist Walk 50 feet with 2 turns activity did not occur: Safety/medical concerns  Walk 150 feet activity   Assist Walk 150 feet activity did not occur: Safety/medical concerns         Walk 10 feet on uneven surface  activity   Assist Walk 10 feet on uneven surfaces activity did not occur: Safety/medical concerns         Wheelchair     Assist Will patient use wheelchair at discharge?: No (No Long term goals set by PT)             Wheelchair 50 feet with 2 turns activity    Assist            Wheelchair 150 feet activity     Assist          Blood pressure (!) 116/98,  pulse 77, temperature (!) 97.4 F (36.3 C), resp. rate 19, weight 54.3 kg, SpO2 93 %.  Medical Problem List and Plan: 1.  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 Formulized individual treatment plan based on medical history, underlying problem and comorbidities. Needs specialty WC, tilt in space due to trunkal weakness, pressure relief, poor awareness of body position 2.  Antithrombotics: -DVT/anticoagulation:  Mechanical: Sequential compression devices, below knee Bilateral lower extremities  Vascular ultrasound negative for DVT             -antiplatelet therapy: N/a due to bleed.  3. Pain Management: Back pain resolved with treatment of UTI. Discontinued hydrocodone and will use tramadol or tylenol prn for now.   8/1- pt denies pain- con't prns 4. Anxiety disorder/Mood:Neuropsych     limit sedating meds  -antipsychotic agents: N/a 5. Neuropsych: This patient is not fully capable of making decisions on her own behalf. 6. Skin/Wound Care:  Routine pressure relief measures.  7. Fluids/Electrolytes/Nutrition: Strict I/Os. Meal intake 50-75% 8. NSTEMI with fluid overload: Treated medically with Coreg, Lisinopril and Lipitor.  9. Severe COPD: Encourage pulmonary hygiene--continue flutter. Wean oxygen to off as able as has not use it for past year per son. Albuterol MDI prn.  Discussed with nursing to maintain oxygen sats in high 80s/low 90s, avoid 100%  7/31- sats 78% this afternoon- no calls about issues- will check with nursing and double check it's OK- sounded great this AM  8/1- back up to 97% with O2 this AM- will need to wean O2 based on need  Continues to require supplemental oxygen prn 10. E coli UTI: Has completed 7 day  antibiotic course on 07/11.  11. HTN: SBP goal <160. Monitor BP --on Norvasc and HCTZ.  Vitals:   09/10/19 2004 09/11/19 0419  BP: 130/64 (!) 116/98  Pulse: 73 77  Resp:    Temp: 97.7 F (36.5 C) (!) 97.4 F (36.3 C)  SpO2: 100% 93%  will d/c HCTZ due to poor fluid intake with diuretic use BP normal12. Lethargy: improved, mainly CVA related  13. Urinary retention with incontinence: Continue monitor voiding with PVR checks/bladder scans 4-6 hours and I/O caht to keep volumes <300 cc.   Flomax started no catheterization for over 2 days  14. A fib: Not AC candidate due to hemorrhage.  Rate controlled. 15.  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  Advance diet as tolerated 16. Tachypnea:   COPD  Improved 17.  Bowel incont  Poor awareness  18.  Elevated BUN/creatinine  Holding IVF, repeat BUN Cr elevated Restart IVF- D/C HCTZ    LOS: 23 days A FACE TO FACE EVALUATION WAS PERFORMED  Charlett Blake 09/11/2019, 10:52 AM

## 2019-09-11 NOTE — Progress Notes (Addendum)
Speech Language Pathology Discharge Summary  Patient Details  Name: Caitlin Vasquez MRN: [id number] Date of Birth: [birth year]  Today's Date: 09/11/2019 SLP Individual Time: 4536-4680 SLP Individual Time Calculation (min): 29 min   Skilled Therapeutic Interventions: Pt was seen for skilled ST targeting cognitive and dysphagia goals. Pt's son present for session but with no further questions regarding swallowing or cognition when provided opportunity for more verbal education. SLP provided skilled observation of pt consuming thin H2O via straw to assess tolerance of recent upgrade and use of swallow strategies. Pt verbally recalled and implemented strategy for extra dry swallow with thins with Supervision A verbal cues. Recommend continue current diet. SLP also administered SLUMS cognitive evaluation as post test this admission. Pt scored 12/30 which still indicates moderately severe neurocognitive impairments, but still improved since admission. Most noteworthy deficits noted in awareness, visual scanning, and problem solving. Results and recommendations reviewed with pt and son. Pt left sitting in chair with alarm set and needs within reach, son still present. Continue per current plan of care.       Patient has met 7 of 8 long term goals.  Patient to discharge at overall Supervision;Min;Mod level.  Reasons goals not met: increased cueing required for functionla problem solving   Clinical Impression/Discharge Summary:   Pt made functional gains and met 7 out of 8 long term goals this admission. Pt currently requires Min-Mod assist for basic cognitive tasks due to impairments in problem solving, left visual inattention, short term memory, awareness, and attention. As a result, pt will require 24/7 supervision for her greatest safety. Pt is consuming an upgraded dysphagia 3 (mechanical soft) texture diet with thin liquids. Pt still exhibits mild oropharyngeal dysphagia, however after most recent MBS she  was upgraded to thin liquids (from nectar). She is using compensatory strategies for safe PO intake (lingual sweep, extra dry swallows, rest breaks, one sip at a time). Pt has demonstrated improved basic familiar problem solving, sustained attention, use of compensatory memory strategies/aids, and oropharyngeal swallow function. However, given cognitive and swallow deficits still present, recommend pt continue to receive skilled ST services upon discharge. Pt and family education is complete at this time.    Care Partner:  Caregiver Able to Provide Assistance: Yes  Type of Caregiver Assistance: Cognitive  Recommendation:  24 hour supervision/assistance;Home Health SLP  Rationale for SLP Follow Up: Maximize swallowing safety;Maximize cognitive function and independence;Reduce caregiver burden   Equipment: none   Reasons for discharge: Discharged from hospital   Patient/Family Agrees with Progress Made and Goals Achieved: Yes    Arbutus Leas 09/11/2019, 3:20 PM

## 2019-09-11 NOTE — Progress Notes (Signed)
Occupational Therapy Session Note  Patient Details  Name: Caitlin Vasquez MRN: [id number] Date of Birth: April 04, 1945  Today's Date: 09/11/2019 OT Individual Time: 1005-1030 OT Individual Time Calculation (min): 25 min    Short Term Goals: Week 3:  OT Short Term Goal 1 (Week 3): STG=LTG OT Short Term Goal 2 (Week 3): Family education  Skilled Therapeutic Interventions/Progress Updates:    Pt in bed to start session.  She was able to work on donning pants in supine rolling with total assist.  Min assist for rolling to the left when pulling pants over hips with max assist to roll to the right.  She needed max assist for transfer to the EOB in preparation for transfer to the wheelchair.  Total assist was needed for stand pivot transfer to the wheelchair with max assist for transfer squat pivot back to the bed.  Pt still with increased trunk flexion in sitting as well as increased pushing to the left when sitting EOB unsupported.  She continues to need max assist if sitting unsupported with the RUE in her lap.  Min to mod assist for static sitting with the RUE supported.  Pt with increased fatigue, so she was returned to supine to rest.  Mod assist was needed for transition from sitting to supine.  Pt left with NT in room to work on voiding with use of the bedpan for safety.    Therapy Documentation Precautions:  Precautions Precautions: Fall, Other (comment) Precaution Comments: supplemental O2, L side inattention, L visual field deficits, left side sensory deficits, left hemiparesis, pusher to the left Restrictions Weight Bearing Restrictions: No LLE Weight Bearing:  (no WB precautions)  Pain: Pain Assessment Pain Scale: Faces Pain Score: 0-No pain ADL: See Care Tool Section for some details of mobility and selfcare  Therapy/Group: Individual Therapy  Kanaya Gunnarson OTR/L 09/11/2019, 12:22 PM

## 2019-09-11 NOTE — Progress Notes (Signed)
Physical Therapy Session Note  Patient Details  Name: Caitlin Vasquez MRN: [id number] Date of Birth: 07/28/1945  Today's Date: 09/11/2019 PT Individual Time: 2633-3545 PT Individual Time Calculation (min): 39 min   Skilled Therapeutic Interventions/Progress Updates:     Pt received seated in [facility] and agreeable to therapy. Son present for family education. No report of pain. PT answers pt's sons questions regarding discharge. WC transport to gym for time management. PT demonstrates car transfer for son. Pt requires maxA to complete squat pivot from WC to car. Pt sits with hips extended in car seat and requires totalA to shift hips backward for functional and safe sitting. MaxA required for transfer back to [facility]. Pt's son then performs same transfer with PT providing min verbal cuing to ensure safe positioning of WC prior to attempt at transfer. Transfer made more difficult by extended hand brakes on WC but son plans to remove extensions following DC. PT answers additional questions form son and provides feedback on safety of transfer. Son verbalizes understanding and feels comfortable performing for pt when necessary. WC transport back to room. Pt left seated in [facility] with son present and all needs within reach.   Therapy Documentation Precautions:  Precautions Precautions: Fall, Other (comment) Precaution Comments: supplemental O2, L side inattention, L visual field deficits, left side sensory deficits, left hemiparesis, pusher to the left Restrictions Weight Bearing Restrictions: No LLE Weight Bearing:  (no WB precautions)     Therapy/Group: Individual Therapy  Beau Fanny, PT, DPT 09/11/2019, 4:51 PM

## 2019-09-11 NOTE — Plan of Care (Signed)
  Problem: Consults Goal: RH STROKE PATIENT EDUCATION Description: See Patient Education module for education specifics  Outcome: Progressing   Problem: RH BOWEL ELIMINATION Goal: RH STG MANAGE BOWEL WITH ASSISTANCE Description: STG Manage Bowel with mod Assistance. Outcome: Progressing Goal: RH STG MANAGE BOWEL W/MEDICATION W/ASSISTANCE Description: STG Manage Bowel with Medication with mod I Assistance. Outcome: Progressing   Problem: RH BLADDER ELIMINATION Goal: RH STG MANAGE BLADDER WITH ASSISTANCE Description: STG Manage Bladder With mod Assistance Outcome: Progressing   Problem: RH SKIN INTEGRITY Goal: RH STG SKIN FREE OF INFECTION/BREAKDOWN Description: No additional breakdown will occur while iin rehab with mod assistance  Outcome: Progressing Goal: RH STG MAINTAIN SKIN INTEGRITY WITH ASSISTANCE Description: STG Maintain Skin Integrity With mod Assistance. Outcome: Progressing   Problem: RH SAFETY Goal: RH STG ADHERE TO SAFETY PRECAUTIONS W/ASSISTANCE/DEVICE Description: STG Adhere to Safety Precautions With mod Assistance/Device. Outcome: Progressing   Problem: RH PAIN MANAGEMENT Goal: RH STG PAIN MANAGED AT OR BELOW PT'S PAIN GOAL Description: Pt will report pain of less than 4 with min assistance Outcome: Progressing   Problem: RH KNOWLEDGE DEFICIT Goal: RH STG INCREASE KNOWLEDGE OF HYPERTENSION Description: Family will review stroke book and be able to identify factors related to hypertension with min assist Outcome: Progressing Goal: RH STG INCREASE KNOWLEDGE OF STROKE PROPHYLAXIS Description: Family will be able to identify stroke prophylaxis with min assist Outcome: Progressing   Problem: RH KNOWLEDGE DEFICIT Goal: RH STG INCREASE KNOWLEGDE OF HYPERLIPIDEMIA Description: Family will be able to identify factors related to HLD  and secondary stroke, dietary restrictions and medication management for with min assist  Outcome: Progressing   Problem: RH  Vision Goal: RH LTG Vision (Specify) Outcome: Progressing   

## 2019-09-12 MED ORDER — CARVEDILOL 12.5 MG PO TABS: 12.5000 mg | ORAL_TABLET | Freq: Two times a day (BID) | ORAL | 1 refills | Status: AC

## 2019-09-12 MED ORDER — LISINOPRIL 10 MG PO TABS: 10.0000 mg | ORAL_TABLET | Freq: Every day | ORAL | 0 refills | Status: AC

## 2019-09-12 MED ORDER — ALBUTEROL SULFATE HFA 108 (90 BASE) MCG/ACT IN AERS: 2.0000 | INHALATION_SPRAY | Freq: Four times a day (QID) | RESPIRATORY_TRACT | 0 refills | Status: AC | PRN

## 2019-09-12 MED ORDER — ILEX SKIN PROTECTANT 58.3 % EX PSTE: PASTE | Freq: Three times a day (TID) | CUTANEOUS | 0 refills | Status: AC | PRN

## 2019-09-12 MED ORDER — STIOLTO RESPIMAT 2.5-2.5 MCG/ACT IN AERS
2.0000 | INHALATION_SPRAY | Freq: Every day | RESPIRATORY_TRACT | 1 refills | Status: AC
Start: 2019-09-12 — End: ?

## 2019-09-12 MED ORDER — MELATONIN 3 MG PO TABS: 3.0000 mg | ORAL_TABLET | Freq: Every evening | ORAL | 0 refills | Status: AC | PRN

## 2019-09-12 MED ORDER — TAMSULOSIN HCL 0.4 MG PO CAPS: 0.4000 mg | ORAL_CAPSULE | Freq: Every day | ORAL | 0 refills | Status: AC

## 2019-09-12 MED ORDER — AMLODIPINE BESYLATE 5 MG PO TABS: 5.0000 mg | ORAL_TABLET | Freq: Every day | ORAL | 0 refills | Status: AC

## 2019-09-12 MED ORDER — ATORVASTATIN CALCIUM 80 MG PO TABS: 80.0000 mg | ORAL_TABLET | Freq: Every day | ORAL | 0 refills | Status: AC

## 2019-09-12 MED ORDER — MIRTAZAPINE 7.5 MG PO TABS: 7.5000 mg | ORAL_TABLET | Freq: Every day | ORAL | 0 refills | Status: AC

## 2019-09-12 MED ORDER — MEGESTROL ACETATE 400 MG/10ML PO SUSP: 400.0000 mg | Freq: Two times a day (BID) | ORAL | 0 refills | Status: AC

## 2019-09-12 NOTE — Discharge Summary (Signed)
Physician Discharge Summary  Patient ID: Caitlin Vasquez MRN: [id number] DOB/AGE: December 01, 1945 74 y.o.  Admit date: 08/19/2019 Discharge date: 09/12/2019  Discharge Diagnoses:  Principal Problem:   Cerebellar hemorrhage (HCC) Active Problems:   Dyspnea   Urinary incontinence   Incontinence of feces   Chronic obstructive pulmonary disease (HCC)   Elevated BUN   Discharged Condition: stable   Significant Diagnostic Studies: DG Chest 2 View  Result Date: 09/01/2019 CLINICAL DATA:  Dyspnea EXAM: CHEST - 2 VIEW COMPARISON:  08/31/2019 FINDINGS: The lungs are mildly hyperinflated in keeping with changes of underlying COPD, unchanged. No superimposed focal pulmonary nodule or infiltrate. No pneumothorax or pleural effusion. Cardiac size within normal limits. Pulmonary vascularity is normal. No acute bone abnormality. IMPRESSION: No active cardiopulmonary disease.  COPD. Electronically Signed   By: Helyn Numbers MD   On: 09/01/2019 17:49   DG Chest 2 View  Result Date: 08/31/2019 CLINICAL DATA:  Dyspnea, history COPD EXAM: CHEST - 2 VIEW COMPARISON:  08/29/2019 FINDINGS: Normal heart size, mediastinal contours, and pulmonary vascularity. Atherosclerotic calcification aorta. Bronchitic and emphysematous changes consistent with history of COPD. No acute infiltrate, pleural effusion or pneumothorax. Bones demineralized with chronic RIGHT rotator cuff tear. Retained contrast in colon. IMPRESSION: COPD changes without acute infiltrate. Aortic Atherosclerosis (ICD10-I70.0). Electronically Signed   By: Ulyses Southward M.D.   On: 08/31/2019 16:29   DG Chest 2 View  Result Date: 08/29/2019 CLINICAL DATA:  Wheezing. EXAM: CHEST - 2 VIEW COMPARISON:  Chest x-ray dated August 19, 2019. FINDINGS: The heart size and mediastinal contours are within normal limits. Atherosclerotic calcification of the aortic arch. Normal pulmonary vascularity. Similar emphysematous changes with chronic interstitial coarsening. No focal  consolidation, pleural effusion, or pneumothorax. No acute osseous abnormality. IMPRESSION: 1. No acute cardiopulmonary disease. 2. COPD. Electronically Signed   By: Obie Dredge M.D.   On: 08/29/2019 19:20   DG Chest 2 View  Result Date: 08/19/2019 CLINICAL DATA:  74 year old female with shortness of breath. History of COPD. Former smoker. EXAM: CHEST - 2 VIEW COMPARISON:  Chest radiograph dated 08/14/2019. FINDINGS: There is background of emphysema, chronic interstitial coarsening and bronchitic changes. No focal consolidation, pleural effusion, or pneumothorax. The cardiac silhouette is within limits. Atherosclerotic calcification of the aorta. Osteopenia with degenerative changes of the spine. No acute osseous pathology. Several radiopaque foci in the left abdomen, likely diverticuliths. IMPRESSION: No active cardiopulmonary disease. Electronically Signed   By: Elgie Collard M.D.   On: 08/19/2019 19:55   DG Swallowing Func-Speech Pathology  Result Date: 09/09/2019 Objective Swallowing Evaluation: Type of Study: MBS-Modified Barium Swallow Study  Patient Details Name: Caitlin Vasquez MRN: [id number] Date of Birth: [birth year] Today's Date: 09/09/2019 Past Medical History: Past Medical History: Diagnosis Date  COPD (chronic obstructive pulmonary disease) (HCC)   High cholesterol   On home oxygen therapy   "2L; all the time" (04/06/2016)  Pneumonia   "when I was a kid"   Urinary hesitancy  Past Surgical History: Past Surgical History: Procedure Laterality Date  ABDOMINAL AORTOGRAM N/A 04/08/2016  Procedure: Abdominal Aortogram;  Surgeon: Sherren Kerns, MD;  Location: [facility] INVASIVE CV LAB;  Service: Cardiovascular;  Laterality: N/A;  CARDIAC CATHETERIZATION N/A 11/28/2014  Procedure: Right/Left Heart Cath and Coronary Angiography;  Surgeon: [name], MD;  Location: [facility] INVASIVE CV LAB;  Service: Cardiovascular;  Laterality: N/A;  CARDIAC CATHETERIZATION  08/2006  Hattie Perch 08/25/2006  DRESSING CHANGE  UNDER ANESTHESIA Left 04/12/2016  Procedure: DRESSING CHANGE LEFT HIP AND LEFT HEEL;  Surgeon: Fransisco Hertz, MD;  Location: [facility] OR;  Service: Vascular;  Laterality: Left;  FEMORAL-POPLITEAL BYPASS GRAFT Left 04/12/2016  Procedure: LEFT FEMORAL-POPITEAL  BYPASS GRAFT;  Surgeon: Fransisco Hertz, MD;  Location: [facility] OR;  Service: Vascular;  Laterality: Left;  JOINT REPLACEMENT    LOWER EXTREMITY ANGIOGRAPHY Bilateral 04/08/2016  Procedure: Lower Extremity Angiography;  Surgeon: Sherren Kerns, MD;  Location[facility] INVASIVE CV LAB;  Service: Cardiovascular;  Laterality: Bilateral;  PERIPHERAL VASCULAR INTERVENTION Bilateral 04/08/2016  Procedure: Peripheral Vascular Intervention;  Surgeon: Sherren Kerns, MD;  Location: [facility] INVASIVE CV LAB;  Service: Cardiovascular;  Laterality: Bilateral;  common iliac  TOTAL HIP ARTHROPLASTY Left 01/29/2016  VEIN HARVEST Left 04/12/2016  Procedure: LEFT GREATER SAPHENOUS VEIN HARVEST;  Surgeon: Fransisco Hertz, MD;  Location: [facility] OR;  Service: Vascular;  Laterality: Left; HPI: Pt is a 74 y.o. female with medical history significant of COPD, Afib; PVD s/p L fem-pop bypass and B CIA stenting; and HLD.  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 She was admitted to rehab on 7/6 with ongoing mild AMS, left inattention, LUE weakness, and tendency to "slump to the left."  She developed acute SOB on 7/7 with sats 75% on 3L [place] O2. CXR 7/7: Bilateral interstitial opacity, likely edema superimposed on the patient's emphysema. CT head: Interval evolution of large right PCA territory infarct involving the parasagittal temporal lobe, occipital lobe, and right basal ganglia. Pt admitted to [facility] 08/19/19.  Assessment / Plan / Recommendation CHL IP CLINICAL IMPRESSIONS 09/09/2019 Clinical Impression --Pt demonstrates noteworthy improvements in oropharyngeal swallow function today in comparison to previous MBSS's (conducted 08/12/19 and 08/29/19). No  penetration or aspiration was observed across any liquid or solid textures. Her swallow initiation still occurs at the level of the pyriform sinuses, however no pooling is occurring and delay is much more mild in comparison to previous studies. There is also no longer a functional impact of delayed initiation, as her airway protection remained fully in tact across trials. Use of a straw did no impact swallow function. Due to lingual/palatal residue, which later falls into the pharynx and results and vallecular residue, pt does need to utilize an extra dry swallow. She demonstrated ability to follow commands to perform volitional dry swallow with 100% accuracy, and it was effective to clear the residue, which will further minimize her aspiration risk. Reduced bolus cohesion and posterior propulsion of thin with barium pill noted, and barium pill ultimately expectorated. Although some lingual pumping occurred when she consume barium pill with applesauce, deglutition was functional and no airway intrusion noted. Although SLP cannot diagnose below the level of the upper esophageal sphincter, a brief scans of the esophagus revealed pill and applesauce stagnant in esophagus, which may warrant further investigation from GI perspective if pt is experiencing symptoms. Recommend pt continue dysphagia 3 (mechnical soft) solid textures, upgrade to thin liquids using slow rate and extra dry swallows, remain seated upright at least 30-60 mins after intake, please provide full supervision during meals to cue for swallow strategies. Medications may be administered one at a time whole in applesauce. ST will continue to provide skilled interventions for dysphagia while inpatient in order to ensure diet safety and efficiency and promote more independence with use of her swallow strategies.  SLP Visit Diagnosis Dysphagia, oropharyngeal phase (R13.12) Attention and concentration deficit following -- Frontal lobe and executive function  deficit following -- Impact on safety and function Mild  aspiration risk   CHL IP TREATMENT RECOMMENDATION 08/15/2019 Treatment Recommendations Therapy as outlined in treatment plan below   Prognosis 08/15/2019 Prognosis for Safe Diet Advancement Good Barriers to Reach Goals Cognitive deficits Barriers/Prognosis Comment -- CHL IP DIET RECOMMENDATION 09/09/2019 SLP Diet Recommendations Dysphagia 3 (Mech soft) solids;Thin liquid Liquid Administration via Cup;Straw Medication Administration Whole meds with puree Compensations Slow rate;Small sips/bites;Multiple dry swallows after each bite/sip Postural Changes Remain semi-upright after after feeds/meals (Comment);Seated upright at 90 degrees   CHL IP OTHER RECOMMENDATIONS 09/09/2019 Recommended Consults -- Oral Care Recommendations Oral care BID Other Recommendations --   CHL IP FOLLOW UP RECOMMENDATIONS 08/19/2019 Follow up Recommendations Inpatient Rehab   CHL IP FREQUENCY AND DURATION 08/15/2019 Speech Therapy Frequency (ACUTE ONLY) min 2x/week Treatment Duration 2 weeks      CHL IP ORAL PHASE 09/09/2019 Oral Phase Impaired Oral - Pudding Teaspoon -- Oral - Pudding Cup -- Oral - Honey Teaspoon -- Oral - Honey Cup -- Oral - Nectar Teaspoon -- Oral - Nectar Cup Decreased bolus cohesion;Piecemeal swallowing;Lingual/palatal residue;Delayed oral transit Oral - Nectar Straw NT Oral - Thin Teaspoon -- Oral - Thin Cup Left anterior bolus loss;Decreased bolus cohesion;Piecemeal swallowing;Lingual/palatal residue Oral - Thin Straw Decreased bolus cohesion;Piecemeal swallowing;Lingual/palatal residue Oral - Puree WFL Oral - Mech Soft NT Oral - Regular -- Oral - Multi-Consistency -- Oral - Pill Delayed oral transit;Decreased bolus cohesion;Reduced posterior propulsion;Lingual pumping Oral Phase - Comment --  CHL IP PHARYNGEAL PHASE 09/09/2019 Pharyngeal Phase Impaired Pharyngeal- Pudding Teaspoon -- Pharyngeal -- Pharyngeal- Pudding Cup -- Pharyngeal -- Pharyngeal- Honey Teaspoon -- Pharyngeal  -- Pharyngeal- Honey Cup -- Pharyngeal -- Pharyngeal- Nectar Teaspoon -- Pharyngeal -- Pharyngeal- Nectar Cup Pharyngeal residue - valleculae Pharyngeal Material does not enter airway Pharyngeal- Nectar Straw NT Pharyngeal -- Pharyngeal- Thin Teaspoon -- Pharyngeal -- Pharyngeal- Thin Cup Delayed swallow initiation-pyriform sinuses;Pharyngeal residue - valleculae;Compensatory strategies attempted (with notebox) Pharyngeal Material does not enter airway Pharyngeal- Thin Straw Delayed swallow initiation-pyriform sinuses;Pharyngeal residue - valleculae;Compensatory strategies attempted (with notebox) Pharyngeal Material does not enter airway Pharyngeal- Puree WFL Pharyngeal -- Pharyngeal- Mechanical Soft NT Pharyngeal -- Pharyngeal- Regular -- Pharyngeal -- Pharyngeal- Multi-consistency -- Pharyngeal -- Pharyngeal- Pill WFL Pharyngeal -- Pharyngeal Comment --  CHL IP CERVICAL ESOPHAGEAL PHASE 09/09/2019 Cervical Esophageal Phase WFL Pudding Teaspoon -- Pudding Cup -- Honey Teaspoon -- Honey Cup -- Nectar Teaspoon -- Nectar Cup -- Nectar Straw -- Thin Teaspoon -- Thin Cup -- Thin Straw -- Puree -- Mechanical Soft -- Regular -- Multi-consistency -- Pill -- Cervical Esophageal Comment -- Little Ishikawa 09/09/2019, 10:44 AM              DG Swallowing Func-Speech Pathology  Result Date: 08/29/2019 Objective Swallowing Evaluation: Type of Study: MBS-Modified Barium Swallow Study  Patient Details Name: ARIAL GALLIGAN MRN: [id number] Date of Birth: 12/24/1945 Today's Date: 08/29/2019 Past Medical History: Past Medical History: Diagnosis Date  COPD (chronic obstructive pulmonary disease) (HCC)   High cholesterol   On home oxygen therapy   "2L; all the time" (04/06/2016)  Pneumonia   "when I was a kid"   Urinary hesitancy  Past Surgical History: Past Surgical History: Procedure Laterality Date  ABDOMINAL AORTOGRAM N/A 04/08/2016  Procedure: Abdominal Aortogram;  Surgeon: Sherren Kerns, MD;  Location: [facility] INVASIVE CV LAB;   Service: Cardiovascular;  Laterality: N/A;  CARDIAC CATHETERIZATION N/A 11/28/2014  Procedure: Right/Left Heart Cath and Coronary Angiography;  Surgeon: [name], MD;  Location: [facility] INVASIVE CV LAB;  Service: Cardiovascular;  Laterality: N/A;  CARDIAC CATHETERIZATION  08/2006  Hattie Perch 08/25/2006  DRESSING CHANGE UNDER ANESTHESIA Left 04/12/2016  Procedure: DRESSING CHANGE LEFT HIP AND LEFT HEEL;  Surgeon: Fransisco Hertz, MD;  Location: [facility] OR;  Service: Vascular;  Laterality: Left;  FEMORAL-POPLITEAL BYPASS GRAFT Left 04/12/2016  Procedure: LEFT FEMORAL-POPITEAL  BYPASS GRAFT;  Surgeon: Fransisco Hertz, MD;  Location: [facility] OR;  Service: Vascular;  Laterality: Left;  JOINT REPLACEMENT    LOWER EXTREMITY ANGIOGRAPHY Bilateral 04/08/2016  Procedure: Lower Extremity Angiography;  Surgeon: Sherren Kerns, MD;  Location: [facility] INVASIVE CV LAB;  Service: Cardiovascular;  Laterality: Bilateral;  PERIPHERAL VASCULAR INTERVENTION Bilateral 04/08/2016  Procedure: Peripheral Vascular Intervention;  Surgeon: Sherren Kerns, MD;  Location: [facility] INVASIVE CV LAB;  Service: Cardiovascular;  Laterality: Bilateral;  common iliac  TOTAL HIP ARTHROPLASTY Left 01/29/2016  VEIN HARVEST Left 04/12/2016  Procedure: LEFT GREATER SAPHENOUS VEIN HARVEST;  Surgeon: Fransisco Hertz, MD;  Location: [facility] OR;  Service: Vascular;  Laterality: Left; HPI: Pt is a 74 y.o. female with medical history significant of COPD, Afib; PVD s/p L fem-pop bypass and B CIA stenting; and HLD.  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 She was admitted to rehab on 7/6 with ongoing mild AMS, left inattention, LUE weakness, and tendency to "slump to the left."  She developed acute SOB on 7/7 with sats 75% on 3L [place] O2. CXR 7/7: Bilateral interstitial opacity, likely edema superimposed on the patient's emphysema. CT head: Interval evolution of large right PCA territory infarct involving the parasagittal temporal lobe,  occipital lobe, and right basal ganglia. Pt admitted to [facility] 08/19/19.  Assessment / Plan / Recommendation CHL IP CLINICAL IMPRESSIONS 08/29/2019 Clinical Impression Pt's oropharyngeal swallow function is largely unchanged from previous MBSS on 08/12/19. She demonstrates reduced oral control of liquid boluses as well as silent aspiration. Thin barium from cup and straw was consistently penetrated to the level of the vocal folds and not ejected, and aspirated without sensation X1 (PAS scale 5 and 8). Although piecemeal swallowing and delayed swallow initiation (liquid pools in valleculae and pyriform sinuses at times) occurs with both thin and nectar, pt's airway protection was increased with nectar thick liquids as opposed to thin. Nectar barium did pass below the vocal folds X1 during the swallow, however all material was ejected from vestibule and trachea during the swallow. Pt became increasingly lethargic throughout study and was unable to follow commands for any compensatory strategies, or to self monitor bolus size (previous MBSS also noted compensatory maneuvers did not increase airway protection). Recommend pt continue current Dysphagia 3 (mechanical soft) and nectar thick textures. She should perform extra dry swallows when drinking liquids, although during study she demonstrated ability to sense need for extra dry swallow to clear pharyngeal residue in 90% of opportunities. Please provide full supervision to ensure use of safe swallow strategies and optimal upright positioning during intake. ST will continue to provide skilled interventions to work toward further advancement while inpatient. SLP Visit Diagnosis Dysphagia, oropharyngeal phase (R13.12) Attention and concentration deficit following -- Frontal lobe and executive function deficit following -- Impact on safety and function Moderate aspiration risk   CHL IP TREATMENT RECOMMENDATION 08/15/2019 Treatment Recommendations Therapy as outlined in treatment  plan below   Prognosis 08/15/2019 Prognosis for Safe Diet Advancement Good Barriers to Reach Goals Cognitive deficits Barriers/Prognosis Comment -- CHL IP DIET RECOMMENDATION 08/29/2019 SLP Diet Recommendations Dysphagia 3 (Mech soft) solids;Nectar thick  liquid Liquid Administration via Cup Medication Administration Whole meds with puree Compensations Slow rate;Small sips/bites;Multiple dry swallows after each bite/sip Postural Changes Remain semi-upright after after feeds/meals (Comment);Seated upright at 90 degrees   CHL IP OTHER RECOMMENDATIONS 08/29/2019 Recommended Consults -- Oral Care Recommendations Oral care BID Other Recommendations Order thickener from pharmacy;Prohibited food (jello, ice cream, thin soups);Remove water pitcher;Have oral suction available   CHL IP FOLLOW UP RECOMMENDATIONS 08/19/2019 Follow up Recommendations Inpatient Rehab   CHL IP FREQUENCY AND DURATION 08/15/2019 Speech Therapy Frequency (ACUTE ONLY) min 2x/week Treatment Duration 2 weeks      CHL IP ORAL PHASE 08/29/2019 Oral Phase Impaired Oral - Pudding Teaspoon -- Oral - Pudding Cup -- Oral - Honey Teaspoon -- Oral - Honey Cup -- Oral - Nectar Teaspoon -- Oral - Nectar Cup Lingual/palatal residue;Piecemeal swallowing;Decreased bolus cohesion;Delayed oral transit Oral - Nectar Straw NT Oral - Thin Teaspoon -- Oral - Thin Cup Left anterior bolus loss;Piecemeal swallowing;Delayed oral transit;Premature spillage;Decreased bolus cohesion;Lingual/palatal residue Oral - Thin Straw Delayed oral transit;Premature spillage;Decreased bolus cohesion;Piecemeal swallowing Oral - Puree NT Oral - Mech Soft NT Oral - Regular -- Oral - Multi-Consistency -- Oral - Pill -- Oral Phase - Comment --  CHL IP PHARYNGEAL PHASE 08/29/2019 Pharyngeal Phase Impaired Pharyngeal- Pudding Teaspoon -- Pharyngeal -- Pharyngeal- Pudding Cup -- Pharyngeal -- Pharyngeal- Honey Teaspoon -- Pharyngeal -- Pharyngeal- Honey Cup -- Pharyngeal -- Pharyngeal- Nectar Teaspoon --  Pharyngeal -- Pharyngeal- Nectar Cup Delayed swallow initiation-vallecula;Delayed swallow initiation-pyriform sinuses;Penetration/Aspiration during swallow Pharyngeal Material enters airway, passes BELOW cords then ejected out Pharyngeal- Nectar Straw NT Pharyngeal -- Pharyngeal- Thin Teaspoon -- Pharyngeal -- Pharyngeal- Thin Cup Delayed swallow initiation-vallecula;Delayed swallow initiation-pyriform sinuses;Reduced airway/laryngeal closure;Penetration/Aspiration during swallow Pharyngeal Material enters airway, CONTACTS cords and not ejected out;Material enters airway, passes BELOW cords without attempt by patient to eject out (silent aspiration) Pharyngeal- Thin Straw Delayed swallow initiation-vallecula;Delayed swallow initiation-pyriform sinuses;Reduced airway/laryngeal closure;Penetration/Aspiration during swallow Pharyngeal Material enters airway, CONTACTS cords and not ejected out Pharyngeal- Puree NT Pharyngeal -- Pharyngeal- Mechanical Soft NT Pharyngeal -- Pharyngeal- Regular -- Pharyngeal -- Pharyngeal- Multi-consistency -- Pharyngeal -- Pharyngeal- Pill -- Pharyngeal -- Pharyngeal Comment --  CHL IP CERVICAL ESOPHAGEAL PHASE 08/29/2019 Cervical Esophageal Phase WFL Pudding Teaspoon -- Pudding Cup -- Honey Teaspoon -- Honey Cup -- Nectar Teaspoon -- Nectar Cup -- Nectar Straw -- Thin Teaspoon -- Thin Cup -- Thin Straw -- Puree -- Mechanical Soft -- Regular -- Multi-consistency -- Pill -- Cervical Esophageal Comment -- Little Ishikawarin E Smith 08/29/2019, 10:19 AM              VAS US LOWER EXTREMITY VENOUS (DVT)  Result Date: 08/24/2019  Lower Venous DVT Study Indications: Immobility, rehabilitation.  Limitations: Altered mental status, immobility. Comparison Study: Prior study from 04/07/16 is available for comparison Performing Technologist: Sherren Kernsandace Kanady RVS  Examination Guidelines: A complete evaluation includes B-mode imaging, spectral Doppler, color Doppler, and power Doppler as needed of all accessible  portions of each vessel. Bilateral testing is considered an integral part of a complete examination. Limited examinations for reoccurring indications may be performed as noted. The reflux portion of the exam is performed with the patient in reverse Trendelenburg.  +---------+---------------+---------+-----------+----------+--------------+  RIGHT     Compressibility Phasicity Spontaneity Properties Thrombus Aging  +---------+---------------+---------+-----------+----------+--------------+  CFV       Full            Yes       Yes                                    +---------+---------------+---------+-----------+----------+--------------+  SFJ       Full                                                             +---------+---------------+---------+-----------+----------+--------------+  FV Prox   Full                                                             +---------+---------------+---------+-----------+----------+--------------+  FV Mid    Full                                                             +---------+---------------+---------+-----------+----------+--------------+  FV Distal Full                                                             +---------+---------------+---------+-----------+----------+--------------+  PFV       Full                                                             +---------+---------------+---------+-----------+----------+--------------+  POP       Full            Yes       Yes                                    +---------+---------------+---------+-----------+----------+--------------+  PTV       Full                                                             +---------+---------------+---------+-----------+----------+--------------+  PERO      Full                                                             +---------+---------------+---------+-----------+----------+--------------+   +---------+---------------+---------+-----------+----------+--------------+  LEFT       Compressibility Phasicity Spontaneity Properties Thrombus Aging  +---------+---------------+---------+-----------+----------+--------------+  CFV       Full            Yes       Yes                                    +---------+---------------+---------+-----------+----------+--------------+  SFJ       Full                                                             +---------+---------------+---------+-----------+----------+--------------+  FV Prox   Full                                                             +---------+---------------+---------+-----------+----------+--------------+  FV Mid    Full                                                             +---------+---------------+---------+-----------+----------+--------------+  FV Distal Full                                                             +---------+---------------+---------+-----------+----------+--------------+  PFV       Full                                                             +---------+---------------+---------+-----------+----------+--------------+  POP                       Yes       Yes                                    +---------+---------------+---------+-----------+----------+--------------+  PTV       Full                                                             +---------+---------------+---------+-----------+----------+--------------+  PERO      Full                                                             +---------+---------------+---------+-----------+----------+--------------+     Summary: BILATERAL: - No evidence of deep vein thrombosis seen in the lower extremities, bilaterally. -   *See table(s) above for measurements and observations. Electronically signed by Fabienne Bruns MD on 08/24/2019 at 12:35:09 PM.    Final     Labs:  Basic Metabolic Panel: BMP  Latest Ref Rng & Units 09/11/2019 09/09/2019 09/02/2019  Glucose 70 - 99 mg/dL 99 161(W) 97  BUN 8 - 23 mg/dL 96(E) 45(W) 21  Creatinine 0.44 - 1.00  mg/dL 0.98(J) 1.91(Y) 7.82(N)  Sodium 135 - 145 mmol/L 143 143 143  Potassium 3.5 - 5.1 mmol/L 4.3 4.9 4.2  Chloride 98 - 111 mmol/L 111 108 109  CO2 22 - 32 mmol/L 16(L) 25 24  Calcium 8.9 - 10.3 mg/dL 8.9 9.2 9.0    CBC: CBC Latest Ref Rng & Units 09/09/2019 09/02/2019 08/31/2019  WBC 4.0 - 10.5 K/uL 8.2 11.5(H) 11.2(H)  Hemoglobin 12.0 - 15.0 g/dL 11.8(L) 11.7(L) 12.0  Hematocrit 36 - 46 % 37.6 36.0 37.6  Platelets 150 - 400 K/uL 217 244 269    CBG: No results for input(s): GLUCAP in the last 168 hours.  Brief HPI:   Caitlin Vasquez is a 74 y.o. female with history of COPD, A. 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  Neurosurgery felt surgical intervention not needed.  Hospital course significant for leukocytosis due to E. coli UTI as well as urinary retention.  She was cleared for intensive rehab program on 07/06 however on early am of  07/07 she was noted to have hypoxia due to fluid overload and was treated with IV diuresis.  She was noted to have lethargy with increasing weakness and decrease in mentation therefore transferred to acute services for further work-up.  CT of the head was done due to concerns of extension and showed evolution of right-PCA infarct involving right parasagittal temporal lobe and right occipital lobe with mild associated mass-effect and unchanged inferior left-IPH with small amount of vasogenic edema.  She did have a rise in troponin with ST changes in lateral leads felt to be due to NSTEMI.  Dr. Algie Coffer following for input and felt that as patient without chest pain, intervention not needed.  Electrolyte abnormalities were resolving and neurology  felt that hypoxia with hypertensive emergency accounted for increase in left-sided deficits.  Patient continued to have limitations due to left hemiparesis with right gaze preference and left inattention with bouts of lethargy.  She completed her antibiotic course on 07/11 and was medically cleared to resume her CIR course.   Hospital Course: HENCHY MCCAULEY was admitted to rehab 08/19/2019 for inpatient therapies to consist of PT, ST and OT at least three hours five days a week. Past admission physiatrist, therapy team and rehab RN have worked together to provide customized collaborative inpatient rehab. Blood pressures were monitored on TID basis and have been relatively stable.  Patient not felt to be AC candidate due to hemorrhage and heart rate has been controlled.  BLE Dopplers were negative for DVT. Serial CBC showed that reactive leucocytosis due to UTI has resolved and H/H is relatively stable.  Tachypnea felt to be due to bronchospasm and atelectasis and pulmonary hygiene recommended. Nebs were scheduled on tid prior to activity and she continues to require supplemental oxygen to maintain adequate saturation with therapy.  CXR repeated during her stay and showed NAD. Her son did report use of maintenance inhaler at home therefore this was resumed at discharge.  Megace was added as  appetite stimulant and her intake has improved with family providing food from home for supplement. As follow-up functions improved she was advanced to thin liquids.  Follow up check of lytes shows recurrent AKI and family has been educated on encouraging fluid intake after discharge Flomax was added to help with urinary retention and she is voiding without difficulty.  She currently is incontinent of bowel and bladder.  Mentation has gradually improved with resolution of lethargy.  Sedating medications were discontinued and back pain is managed with Tylenol as needed.  She has made minimal progress during her rehab stay due  to significant neurological impairments due to left hemiparesis with truncal weakness, decrease in motor planning as well as poor positional awareness.  She continues to require mod to max assist.  She will continue to receive follow-up home health PT, OT, ST and SNA by [facility] home health after discharge  .  Rehab course: During patient's stay in rehab weekly team conferences were held to monitor patient's progress, set goals and discuss barriers to discharge. At admission, patient required total assist with ADL task and +2 total assist for mobility.  She exhibited  Moderate oropharyngeal dysphagia with weak cough and severe cognitive deficits with a score of 15 out of 30 on SLUMS.   She  has had improvement in activity tolerance, balance, postural control as well as ability to compensate for deficits.  She requires min to max assist with ADL tasks.  She requires max assist for squat pivot transfers with min verbal cues for safety.  Levels of assistance fluctuates from mod to max assist due to issues with delayed initiation, apraxia as well as pusher tendencies. She is able to recall swallow strategies with supervisory cues.  Cognition has improved however she continues to require min to mod assist for basic cognitive tasks and moderate to severe cognitive impairments.  Family education was completed with son regarding all aspects of safety, mobility and cognitive needs.    Disposition: Home  Diet: Dysphagia 3, thin liquids.  Needs to be upright for meals  Special Instructions: 1. Offer fluids throughout the day. 2.  Repeat BMET in 1-2 weeks      Allergies as of 09/12/2019      Reactions   Clonidine Derivatives Other (See Comments)   hypotension      Medication List    STOP taking these medications   aspirin 81 MG EC tablet   enoxaparin 40 MG/0.4ML injection Commonly known as: LOVENOX   hydrochlorothiazide 12.5 MG capsule Commonly known as: MICROZIDE   Resource ThickenUp Clear Powd    senna-docusate 8.6-50 MG tablet Commonly known as: Senokot-S   sodium chloride 0.9 % infusion     TAKE these medications   acetaminophen 500 MG tablet Commonly known as: TYLENOL Take 500 mg by mouth 2 (two) times daily as needed for mild pain.   albuterol 108 (90 Base) MCG/ACT inhaler Commonly known as: VENTOLIN HFA Inhale 2 puffs into the lungs every 6 (six) hours as needed for wheezing or shortness of breath.   amLODipine 5 MG tablet Commonly known as: NORVASC Take 1 tablet (5 mg total) by mouth daily.   atorvastatin 80 MG tablet Commonly known as: LIPITOR Take 1 tablet (80 mg total) by mouth daily.   carvedilol 12.5 MG tablet Commonly known as: COREG Take 1 tablet (12.5 mg total) by mouth 2 (two) times daily with a meal.   lisinopril 10 MG tablet Commonly known as: ZESTRIL Take 1 tablet (10 mg total)  by mouth daily.   megestrol 400 MG/10ML suspension Commonly known as: MEGACE Take 10 mLs (400 mg total) by mouth 2 (two) times daily.   melatonin 3 MG Tabs tablet Take 1 tablet (3 mg total) by mouth at bedtime as needed (insomnia).   mirtazapine 7.5 MG tablet Commonly known as: REMERON Take 1 tablet (7.5 mg total) by mouth at bedtime.   Stiolto Respimat 2.5-2.5 MCG/ACT Aers Generic drug: Tiotropium Bromide-Olodaterol Inhale 2 puffs into the lungs daily.   tamsulosin 0.4 MG Caps capsule Commonly known as: FLOMAX Take 1 capsule (0.4 mg total) by mouth daily after supper.   white petrolatum-zinc 58.3 % cream Apply topically 3 (three) times daily as needed. To sacrum       Follow-up Information    [facility]. Call in 1 day(s).   Why: for post stroke follow up Contact information: [address]     [address] [place] [address] [phone]       [name], MD Follow up.   Specialty: Cardiology Why: call to  make post hospital follow up Contact information: [address] [place]  [address] [phone]        Kirsteins, Victorino Sparrow, MD Follow up.   Specialty: Physical Medicine and Rehabilitation Why: Office will call you with follow up appointment Contact information: [address] [place] [address] [phone]               Signed: Jacquelynn Cree 09/17/2019, 6:50 PM

## 2019-09-12 NOTE — Progress Notes (Signed)
Inpatient Rehabilitation Care Coordinator  Discharge Note  The overall goal for the admission was met for:   Discharge location: Yes  Length of Stay: 24 days with discharge 09/12/19  Discharge activity level: Patient to discharge at overall Max Assist level.  Pt requires maxA to complete squat pivot from WC to car.  Home/community participation: Community education officer provided included: MD, RD, PT, OT, SLP, RN, CM, Pharmacy and SW  Financial Services: Medicare  Follow-up services arranged: [facility] PT, OT and SLP, Aide DME: Hospital bed, wheelchair, half lap tray left and DABSC Family has no preference for agencies  Comments (or additional information):  [facility] [place] [phone]  Patient/Family verbalized understanding of follow-up arrangements: Yes   Patient's care partner is independent to provide the necessary physical and cognitive assistance at discharge. PT answers additional questions form son and provides feedback on safety of transfer. Son verbalizes understanding and feels comfortable performing for pt when necessary.  Individual responsible for coordination of the follow-up plan: Son :Caitlin Vasquez [phone] Roanna Raider [phone]  Confirmed correct DME delivered: Margarito Liner 09/12/2019    Transportation to the home arranged with [facility]; pick up @ 1530 09/12/19.  Margarito Liner

## 2019-09-12 NOTE — Discharge Instructions (Signed)
Inpatient Rehab Discharge Instructions  Caitlin Vasquez Discharge date and time:  09/12/19  Activities/Precautions/ Functional Status: Activity: no lifting, driving, or strenuous exercise for  till cleared by md Diet: soft food. Needs to be upright for meals. Follow all aspiration precautions.  Wound Care: keep wound clean and dry Apply Desitin or Vitamin A&D ointment to sacrum 2-3 times a day. Boost every 30 minutes when in chair.    Functional status:  ___ No restrictions     ___ Walk up steps independently _X__ 24/7 supervision/assistance   ___ Walk up steps with assistance ___ Intermittent supervision/assistance  ___ Bathe/dress independently ___ Walk with walker     _X__ Bathe/dress with assistance ___ Walk Independently    ___ Shower independently ___ Walk with assistance    ___ Shower with assistance _X__ No alcohol     ___ Return to work/school ________  COMMUNITY REFERRALS UPON DISCHARGE:  Home Health: PT, OT, ST, SNA  Agency: [facility] [phone]  Medical Equipment/Items Ordered:Hospital bed, wheelchair, half lap tray, DABSC  Agency/Supplier:[facility]  Special Instructions: 1. You need to offer snacks/supplement between meals to help maintain nutritional status. 2. Megace is expensive--appetite stimulant.    STROKE/TIA DISCHARGE INSTRUCTIONS SMOKING Cigarette smoking nearly doubles your risk of having a stroke & is the single most alterable risk factor  If you smoke or have smoked in the last 12 months, you are advised to quit smoking for your health.  Most of the excess cardiovascular risk related to smoking disappears within a year of stopping.  Ask you doctor about anti-smoking medications  [place] Quit Line: 1-800-QUIT NOW  Free Smoking Cessation Classes [phone]  CHOLESTEROL Know your levels; limit fat & cholesterol in your diet  Lipid Panel     Component Value Date/Time   CHOL 260 (H) 08/10/2019 2043   TRIG 214 (H)  08/10/2019 2043   HDL 49 08/10/2019 2043   CHOLHDL 5.3 08/10/2019 2043   VLDL 43 (H) 08/10/2019 2043   LDLCALC 168 (H) 08/10/2019 2043      Many patients benefit from treatment even if their cholesterol is at goal.  Goal: Total Cholesterol (CHOL) less than 160  Goal:  Triglycerides (TRIG) less than 150  Goal:  HDL greater than 40  Goal:  LDL (LDLCALC) less than 100   BLOOD PRESSURE American Stroke Association blood pressure target is less that 120/80 mm/Hg  Your discharge blood pressure is:  BP: (!) 123/57  Monitor your blood pressure  Limit your salt and alcohol intake  Many individuals will require more than one medication for high blood pressure  DIABETES (A1c is a blood sugar average for last 3 months) Goal HGBA1c is under 7% (HBGA1c is blood sugar average for last 3 months)  Diabetes: No known diagnosis of diabetes    Lab Results  Component Value Date   HGBA1C 5.6 08/10/2019     Your HGBA1c can be lowered with medications, healthy diet, and exercise.  Check your blood sugar as directed by your physician  Call your physician if you experience unexplained or low blood sugars.  PHYSICAL ACTIVITY/REHABILITATION Goal is 30 minutes at least 4 days per week  Activity: No driving, Therapies: see above Return to work: N/A  Activity decreases your risk of heart attack and stroke and makes your heart stronger.  It helps control your weight and blood pressure; helps you relax and can improve your mood.  Participate in a regular exercise program.  Talk with your doctor about  the best form of exercise for you (dancing, walking, swimming, cycling).  DIET/WEIGHT Goal is to maintain a healthy weight  RESOURCES Stroke/Support Group:  Call [phone]   STROKE EDUCATION PROVIDED/REVIEWED AND GIVEN TO PATIENT Stroke warning signs and symptoms How to activate emergency medical system (call 911). Medications prescribed at discharge. Need for follow-up after discharge. Personal  risk factors for stroke. Pneumonia vaccine given:  Flu vaccine given:  My questions have been answered, the writing is legible, and I understand these instructions.  I will adhere to these goals & educational materials that have been provided to me after my discharge from the hospital.     My questions have been answered and I understand these instructions. I will adhere to these goals and the provided educational materials after my discharge from the hospital.  Patient/Caregiver Signature _______________________________ Date __________  Clinician Signature _______________________________________ Date __________  Please bring this form and your medication list with you to all your follow-up doctor's appointments.

## 2019-09-12 NOTE — Progress Notes (Signed)
[facility] PHYSICAL MEDICINE & REHABILITATION PROGRESS NOTE   Subjective/Complaints:  Pt not aware of date or month but is aware that it is d/c day No c/os discussed skin with RN    ROS:   Pt denies SOB, abd pain, CP, N/V/C/D, and vision changes   Objective:   No results found. No results for input(s): WBC, HGB, HCT, PLT in the last 72 hours. Recent Labs    09/11/19 0522  NA 143  K 4.3  CL 111  CO2 16*  GLUCOSE 99  BUN 41*  CREATININE 1.25*  CALCIUM 8.9    Intake/Output Summary (Last 24 hours) at 09/12/2019 0843 Last data filed at 09/12/2019 0700 Gross per 24 hour  Intake 474 ml  Output 900 ml  Net -426 ml     Physical Exam: Vital Signs Blood pressure (!) 131/108, pulse 77, temperature 97.8 F (36.6 C), resp. rate 18, height 5\' 4"  (1.626 m), weight 54.3 kg, SpO2 98 %.   General: No acute distress Mood and affect are appropriate Heart: Regular rate and rhythm no rubs murmurs or extra sounds Lungs: Clear to auscultation, breathing unlabored, no rales or wheezes Abdomen: Positive bowel sounds, soft nontender to palpation, nondistended Extremities: No clubbing, cyanosis, or edema Skin: stage 1 sacral pressure area  Neurologic: Cranial nerves II through XII intact, motor strength is 5/5 in RIght and 4/5 left deltoid, bicep, tricep, grip, hip flexor, knee extensors, ankle dorsiflexor and plantar flexor Sensory ataxia LUE and LLE  Sensory exam absent LUE and LLE   Musculoskeletal: Full range of motion in all 4 extremities. No joint swelling   Assessment/Plan: 1. Functional deficits secondary to cerebellar hemorrhage Stable for D/C today F/u PCP in 3-4 weeks F/u PM&R 2 weeks See D/C summary See D/C instructions  Care Tool:  Bathing  Bathing activity did not occur: Refused Body parts bathed by patient: Left arm, Chest, Abdomen, Right upper leg, Left upper leg, Face, Front perineal area   Body parts bathed by helper: Right lower leg, Left lower leg, Face,  Right arm     Bathing assist Assist Level: Maximal Assistance - Patient 24 - 49% (sit to supine)     Upper Body Dressing/Undressing Upper body dressing   What is the patient wearing?: Pull over shirt    Upper body assist Assist Level: Total Assistance - Patient < 25% (supported sitting)    Lower Body Dressing/Undressing Lower body dressing      What is the patient wearing?: Incontinence brief, Pants     Lower body assist Assist for lower body dressing: Total Assistance - Patient < 25% (supine rolling)     Toileting Toileting    Toileting assist Assist for toileting: Total Assistance - Patient < 25% (supine rolling in bed)     Transfers Chair/bed transfer  Transfers assist  Chair/bed transfer activity did not occur: Safety/medical concerns  Chair/bed transfer assist level: Maximal Assistance - Patient 25 - 49%     Locomotion Ambulation   Ambulation assist   Ambulation activity did not occur: Safety/medical concerns          Walk 10 feet activity   Assist  Walk 10 feet activity did not occur: Safety/medical concerns        Walk 50 feet activity   Assist Walk 50 feet with 2 turns activity did not occur: Safety/medical concerns         Walk 150 feet activity   Assist Walk 150 feet activity did not occur: Safety/medical concerns  Walk 10 feet on uneven surface  activity   Assist Walk 10 feet on uneven surfaces activity did not occur: Safety/medical concerns         Wheelchair     Assist Will patient use wheelchair at discharge?: No             Wheelchair 50 feet with 2 turns activity    Assist            Wheelchair 150 feet activity     Assist          Blood pressure (!) 131/108, pulse 77, temperature 97.8 F (36.6 C), resp. rate 18, height 5\' 4"  (1.626 m), weight 54.3 kg, SpO2 98 %.  Medical Problem List and Plan: 1.  Impaired mobility and ADLs secondary to Nontruamatic intracerbral  hemorrhage in cerebellum, RIght PCA infarct with Left hemiparesis and neglect  D/c today with son 24/7 care . Needs specialty WC, tilt in space due to trunkal weakness, pressure relief, poor awareness of body position 2.  Antithrombotics: -DVT/anticoagulation:  Mechanical: Sequential compression devices, below knee Bilateral lower extremities  Vascular ultrasound negative for DVT             -antiplatelet therapy: N/a due to bleed.  3. Pain Management: Back pain resolved with treatment of UTI. Discontinued hydrocodone and will use tramadol or tylenol prn for now.   8/1- pt denies pain- con't prns 4. Anxiety disorder/Mood:Neuropsych     limit sedating meds  -antipsychotic agents: N/a 5. Neuropsych: This patient is not fully capable of making decisions on her own behalf. 6. Skin/Wound Care:  stage 1 sacral area, cont pressure relief measures  7. Fluids/Electrolytes/Nutrition: Strict I/Os. Meal intake 50-75% 8. NSTEMI with fluid overload: Treated medically with Coreg, Lisinopril and Lipitor.  9. Severe COPD: Encourage pulmonary hygiene--continue flutter. Wean oxygen to off as able as has not use it for past year per son. Albuterol MDI prn.  Discussed with nursing to maintain oxygen sats in high 80s/low 90s, avoid 100%  7/31- sats 78% this afternoon- no calls about issues- will check with nursing and double check it's OK- sounded great this AM  8/1- back up to 97% with O2 this AM- will need to wean O2 based on need  Continues to require supplemental oxygen prn 10. E coli UTI: Has completed 7 day antibiotic course on 07/11.  11. HTN: SBP goal <160. Monitor BP --on Norvasc and HCTZ.  Vitals:   09/11/19 1944 09/12/19 0707  BP: 128/78 (!) 131/108  Pulse: 79 77  Resp: 20 18  Temp: (!) 97.5 F (36.4 C) 97.8 F (36.6 C)  SpO2: 97% 98%  will d/c HCTZ due to poor fluid intake with diuretic use  Has elevated DBP this am but generally controlled  12. Lethargy: improved, mainly CVA related  13.  Urinary retention with incontinence: Continue monitor voiding with PVR checks/bladder scans 4-6 hours and I/O caht to keep volumes <300 cc.   Flomax started no catheterization for over 2 days  14. A fib: Not AC candidate due to hemorrhage.  Rate controlled. 15.  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  Advance diet as tolerated 16. Tachypnea:   COPD  Improved 17.  Bowel incont  Poor awareness 18.  Elevated BUN/creatinine - D/C HCTZ- BMP stable of IVF, enc po fluid     LOS: 24 days A FACE TO FACE EVALUATION WAS PERFORMED  11/12/19 09/12/2019, 8:43 AM

## 2019-09-12 NOTE — Progress Notes (Signed)
Patient has been given D/C instructions by Marina Goodell. All equipment has been sent home with son along with personal belongings.  Pt is scheduled for transportation via EMS. Denies any pain. Leane Para, LPN

## 2019-09-13 ENCOUNTER — Other Ambulatory Visit: Payer: Self-pay | Admitting: *Deleted

## 2019-09-13 ENCOUNTER — Encounter: Payer: Self-pay | Admitting: *Deleted

## 2019-09-13 NOTE — Patient Outreach (Signed)
[facility]) Care Management  09/13/2019  Caitlin Vasquez [birth year] [id number]   Referral received 8/4 Discharged 8/5 Initial outreach 8/6  Enrolled-Other (stroke) Transition of care completed on 8/6 RN spoke with pt and provider permission to speak with her son Sharia Reeve) today. Caregiver slightly anxious awaiting call from the [facility] for a visiting RN. RN able to verify orders have been placed with Dr. Algie Coffer and provider contact numbers to all other follow up pending. Son verified his brother has picked up all the recommended medications and yes he has the discharge sheet but did not wish to review all her medications at this time. Pt more interested in when [facility] will start. Son is aware to await a call from the agency however RN has provided a contact if he wishes to follow up directly. Further discussion on [facility] services based upon pt's recent discharged and diagnosis with a stroke for enrollment into the [facility] program and services. Caregiver receptive and very appreciative for the assistance today. Will enroll into prevention measures due to pt's recent stroke and cerebellar hemorrhage. Discussed safety related to avoiding readmission with calls to the provider with any symptoms that are getting worse however if acute to call 911. Son verbalized an understanding and will follow up with the provider to confirm all pending appointments.   Further discussed the generated plan of care with education concerning pt's medical condition. Will alert Dr. Algie Coffer which is the provider son states pt visits on a regular and send Welcome and Successful letter to pt based upon her recent enrollment. Due to the pending appointments and arrangements needed for this pt will follow up  Next week on the progress. Again caregiver very appreciative and grateful for the call today.  Goals Addressed            This Visit's Progress   . get better       CARE PLAN ENTRY (see longitudinal plan of  care for additional care plan information)  Current Barriers:  Marland Kitchen Knowledge Deficits related to recent stroke . Chronic Disease Management support and education needs related to stroke  Nurse Case Manager Clinical Goal(s): All goal will be assisted by her caregiver sons . Over the next 90 days, patient will verbalize understanding of plan for managing her blood pressures to prevent re-occurence of  stroke. . Over the next 30 days, patient will attend all scheduled medical appointments: all appointments discussed and caregiver will call to scheduled the pending appointments recommended at discharge . Over the next 30 days, patient will demonstrate improved health management independence as evidenced bymanaging her ongoing care . Over the next 30 days, the patient will demonstrate ongoing self health care management ability as evidenced by no readmission related to stroke.*  Interventions:  . Inter-disciplinary care team collaboration (see longitudinal plan of care) . Collaborated with son regarding prevention measures to avoid readmissions . Discussed plans with patient for ongoing care management follow up and provided patient with direct contact information for care management team . Advised patient, providing education and rationale, to monitor blood pressure daily and record, calling provider for findings outside established parameters.  . Provided patient with printed educational materials related to stroke . Reviewed scheduled/upcoming provider appointments including:   Patient Self Care Activities:  . Patient verbalizes understanding of plan to prevent acute events with managing her ongoing care . Self administers medications as prescribed . Attends all scheduled provider appointments . Calls pharmacy for medication refills . Calls provider office for  new concerns or questions . To manage her care as independent as possible  Initial goal documentation        Elliot Cousin,  RN Care Management Coordinator [facility] Main Office [phone]

## 2019-09-17 ENCOUNTER — Other Ambulatory Visit: Payer: Self-pay | Admitting: *Deleted

## 2019-09-17 NOTE — Patient Outreach (Signed)
[facility]) Care Management  09/17/2019  JESSILYN CATINO 03-05-1945 [id number]   Transition of care-Unsuccessful  RN attempted outreach however unsuccessful. Unable to leave a message will attempted another outreach next week.  Elliot Cousin, RN Care Management Coordinator [facility] Main Office [phone]

## 2019-09-24 NOTE — Progress Notes (Signed)
Patient ID: Caitlin Vasquez, female   DOB: 1945-04-02, 74 y.o.   MRN: [id number]  Diagnosis: Cerebellar Hemorrhage;I 61.4, COPD; J 44.9, Exertional Dyspnea;R06.00, HTN; I10, Hypertensive Emergency I 16.1  The patient's son requested a lightweight standard wheelchair instead of the specialty wheelchair for ease of mobility to/from the car for transportation to follow up appointments with physicians.  Order placed for -16" x 16" lightweight standard wheelchair with anti-tippers, brake extenders, bilateral swing away leg rests -16" x16" standard wheelchair cushion -Left Half lap tray

## 2019-09-25 ENCOUNTER — Other Ambulatory Visit: Payer: Self-pay | Admitting: *Deleted

## 2019-09-25 NOTE — Patient Outreach (Signed)
[facility]) Care Management  09/25/2019  YANIAH THIEMANN May 25, 1945 [id number]   Telephone Assessment-Unsuccessful  RN attempted follow up outreach however unsuccessful and unable to leave a voice message.   Plan: Will rescheduled another outreach call next week and send outreach letter.  Elliot Cousin, RN Care Management Coordinator [facility] Main Office [phone]

## 2019-10-01 ENCOUNTER — Encounter: Payer: Medicare Other | Attending: Physical Medicine & Rehabilitation | Admitting: Physical Medicine & Rehabilitation

## 2019-10-04 ENCOUNTER — Other Ambulatory Visit: Payer: Self-pay | Admitting: *Deleted

## 2019-10-04 NOTE — Progress Notes (Signed)
Patient ID: Caitlin Vasquez, female   DOB: Jul 02, 1945, 74 y.o.   MRN: [id number]   Cerebellar hemorrhage I 61.4; COPD J 44.9; Exertion dyspnea R 06.00; HTN I 10; Hypertensive emergency I 16.1  Ht: 5'4" Wt: 118#  A specialty wheelchair will increase independent with household mobility. Patient unable to perform using a rolling walker due to patient continues to demonstrate the following deficits: muscle weakness and muscle joint tightness, decreased cardiorespiratory endurance and decreased motor planning, decreased midline orientation, decreased initiation, decreased attention, decreased awareness, decreased problem solving, decreased safety awareness, decreased memory and delayed processing, decreased sitting balance, decreased standing balance, decreased postural control and decreased balance strategies and therefore will continue to benefit for a wheelchair to functional independence with mobility. Patient not progressing toward long term goals-patient will be discharging at wheelchair level. A standard wheelchair will not allow patient to safely propel, and lightweight wheelchair will allow pt to self propel in the home.

## 2019-10-04 NOTE — Patient Outreach (Signed)
[facility]) Care Management  10/04/2019  LOANN CHAHAL 1945-06-16 [id number]   Telephone Assessment-Close Closure  Son reports pt has entered [place] [facility] "over week ago" and will remain for the next 2 weeks for rehabilitation. Caregiver is aware [facility] will discharge and if pt is eligible for services upon her discharge to request pt's provider to refer accordingly.   Plan: Will complete a case closure.  Goals Addressed            This Visit's Progress   . get better   Not on track    CARE PLAN ENTRY (see longitudinal plan of care for additional care plan information)  Current Barriers:  Marland Kitchen Knowledge Deficits related to recent stroke . Chronic Disease Management support and education needs related to stroke  Nurse Case Manager Clinical Goal(s): All goal will be assisted by her caregiver sons . Over the next 90 days, patient will verbalize understanding of plan for managing her blood pressures to prevent re-occurence of  stroke. . Over the next 30 days, patient will attend all scheduled medical appointments: all appointments discussed and caregiver will call to scheduled the pending appointments recommended at discharge . Over the next 30 days, patient will demonstrate improved health management independence as evidenced bymanaging her ongoing care . Over the next 30 days, the patient will demonstrate ongoing self health care management ability as evidenced by no readmission related to stroke.*  Interventions:  . Inter-disciplinary care team collaboration (see longitudinal plan of care) . Collaborated with son regarding prevention measures to avoid readmissions . Discussed plans with patient for ongoing care management follow up and provided patient with direct contact information for care management team . Advised patient, providing education and rationale, to monitor blood pressure daily and record, calling provider for findings outside established parameters.   . Provided patient with printed educational materials related to stroke . Reviewed scheduled/upcoming provider appointments including:   Patient Self Care Activities:  . Patient verbalizes understanding of plan to prevent acute events with managing her ongoing care . Self administers medications as prescribed . Attends all scheduled provider appointments . Calls pharmacy for medication refills . Calls provider office for new concerns or questions . To manage her care as independent as possible  Initial goal documentation        Elliot Cousin, RN Care Management Coordinator [facility] Main Office [phone]

## 2020-02-13 ENCOUNTER — Emergency Department (HOSPITAL_COMMUNITY): Payer: Medicare Other

## 2020-02-13 ENCOUNTER — Emergency Department (HOSPITAL_COMMUNITY)
Admission: EM | Admit: 2020-02-13 | Discharge: 2020-02-13 | Disposition: A | Discharge status: Home / Self Care | Payer: Medicare Other | Attending: Emergency Medicine | Admitting: Emergency Medicine

## 2020-02-13 ENCOUNTER — Other Ambulatory Visit: Payer: Self-pay

## 2020-02-13 DIAGNOSIS — R443 Hallucinations, unspecified: Secondary | ICD-10-CM | POA: Diagnosis present

## 2020-02-13 DIAGNOSIS — Z96642 Presence of left artificial hip joint: Secondary | ICD-10-CM | POA: Insufficient documentation

## 2020-02-13 DIAGNOSIS — Z87891 Personal history of nicotine dependence: Secondary | ICD-10-CM | POA: Diagnosis not present

## 2020-02-13 DIAGNOSIS — J449 Chronic obstructive pulmonary disease, unspecified: Secondary | ICD-10-CM | POA: Insufficient documentation

## 2020-02-13 DIAGNOSIS — Z7951 Long term (current) use of inhaled steroids: Secondary | ICD-10-CM | POA: Diagnosis not present

## 2020-02-13 DIAGNOSIS — Z79899 Other long term (current) drug therapy: Secondary | ICD-10-CM | POA: Insufficient documentation

## 2020-02-13 DIAGNOSIS — N3000 Acute cystitis without hematuria: Secondary | ICD-10-CM | POA: Diagnosis not present

## 2020-02-13 DIAGNOSIS — Z20822 Contact with and (suspected) exposure to covid-19: Secondary | ICD-10-CM | POA: Insufficient documentation

## 2020-02-13 DIAGNOSIS — E876 Hypokalemia: Secondary | ICD-10-CM | POA: Diagnosis not present

## 2020-02-13 DIAGNOSIS — I10 Essential (primary) hypertension: Secondary | ICD-10-CM | POA: Insufficient documentation

## 2020-02-13 LAB — RESP PANEL BY RT-PCR (FLU A&B, COVID) ARPGX2
Influenza A by PCR: NEGATIVE
Influenza B by PCR: NEGATIVE
SARS Coronavirus 2 by RT PCR: NEGATIVE

## 2020-02-13 LAB — CBC WITH DIFFERENTIAL/PLATELET
Abs Immature Granulocytes: 0.02 10*3/uL (ref 0.00–0.07)
Basophils Absolute: 0 10*3/uL (ref 0.0–0.1)
Basophils Relative: 1 %
Eosinophils Absolute: 0.1 10*3/uL (ref 0.0–0.5)
Eosinophils Relative: 1 %
HCT: 47.9 % — ABNORMAL HIGH (ref 36.0–46.0)
Hemoglobin: 15.2 g/dL — ABNORMAL HIGH (ref 12.0–15.0)
Immature Granulocytes: 0 %
Lymphocytes Relative: 26 %
Lymphs Abs: 2.1 10*3/uL (ref 0.7–4.0)
MCH: 26.7 pg (ref 26.0–34.0)
MCHC: 31.7 g/dL (ref 30.0–36.0)
MCV: 84 fL (ref 80.0–100.0)
Monocytes Absolute: 0.5 10*3/uL (ref 0.1–1.0)
Monocytes Relative: 6 %
Neutro Abs: 5.2 10*3/uL (ref 1.7–7.7)
Neutrophils Relative %: 66 %
Platelets: 343 10*3/uL (ref 150–400)
RBC: 5.7 MIL/uL — ABNORMAL HIGH (ref 3.87–5.11)
RDW: 15.1 % (ref 11.5–15.5)
WBC: 7.8 10*3/uL (ref 4.0–10.5)
nRBC: 0 % (ref 0.0–0.2)

## 2020-02-13 LAB — URINALYSIS, ROUTINE W REFLEX MICROSCOPIC
Bilirubin Urine: NEGATIVE
Glucose, UA: NEGATIVE mg/dL
Ketones, ur: NEGATIVE mg/dL
Nitrite: NEGATIVE
Protein, ur: 100 mg/dL — AB
Specific Gravity, Urine: 1.017 (ref 1.005–1.030)
pH: 5 (ref 5.0–8.0)

## 2020-02-13 LAB — BASIC METABOLIC PANEL
Anion gap: 15 (ref 5–15)
BUN: 12 mg/dL (ref 8–23)
CO2: 32 mmol/L (ref 22–32)
Calcium: 8.3 mg/dL — ABNORMAL LOW (ref 8.9–10.3)
Chloride: 95 mmol/L — ABNORMAL LOW (ref 98–111)
Creatinine, Ser: 0.89 mg/dL (ref 0.44–1.00)
GFR, Estimated: >60 mL/min (ref >60)
Glucose, Bld: 114 mg/dL — ABNORMAL HIGH (ref 70–99)
Potassium: 2.8 mmol/L — ABNORMAL LOW (ref 3.5–5.1)
Sodium: 142 mmol/L (ref 135–145)

## 2020-02-13 MED ORDER — POTASSIUM CHLORIDE CRYS ER 20 MEQ PO TBCR
40.0000 meq | EXTENDED_RELEASE_TABLET | Freq: Once | ORAL | Status: AC
  Administered 2020-02-13: 40 meq via ORAL
  Filled 2020-02-13: qty 2

## 2020-02-13 MED ORDER — POTASSIUM CHLORIDE 10 MEQ/100ML IV SOLN
10.0000 meq | INTRAVENOUS | Status: AC
  Administered 2020-02-13 (×2): 10 meq via INTRAVENOUS
  Filled 2020-02-13 (×2): qty 100

## 2020-02-13 MED ORDER — CARVEDILOL 12.5 MG PO TABS
12.5000 mg | ORAL_TABLET | Freq: Two times a day (BID) | ORAL | Status: DC
  Administered 2020-02-13: 12.5 mg via ORAL
  Filled 2020-02-13: qty 1

## 2020-02-13 MED ORDER — SODIUM CHLORIDE 0.9 % IV SOLN
1.0000 g | Freq: Once | INTRAVENOUS | Status: AC
  Administered 2020-02-13: 1 g via INTRAVENOUS
  Filled 2020-02-13: qty 10

## 2020-02-13 MED ORDER — POTASSIUM CHLORIDE CRYS ER 20 MEQ PO TBCR: 20.0000 meq | EXTENDED_RELEASE_TABLET | Freq: Two times a day (BID) | ORAL | 0 refills | Status: AC

## 2020-02-13 MED ORDER — AMLODIPINE BESYLATE 5 MG PO TABS
5.0000 mg | ORAL_TABLET | Freq: Once | ORAL | Status: AC
  Administered 2020-02-13: 5 mg via ORAL
  Filled 2020-02-13: qty 1

## 2020-02-13 MED ORDER — CEPHALEXIN 500 MG PO CAPS: 500.0000 mg | ORAL_CAPSULE | Freq: Three times a day (TID) | ORAL | 0 refills | Status: AC

## 2020-02-13 NOTE — ED Provider Notes (Signed)
Patient receiving antibiotics for UTI and then to be discharged.  Also receiving potassium.  Will prescribe antibiotic and potassium.  Hemodynamically stable.  No concern for sepsis.  Discharged in good condition.  This chart was dictated using voice recognition software.  Despite best efforts to proofread,  errors can occur which can change the documentation meaning.     Virgina Norfolk, DO 02/13/20 [phone]

## 2020-02-13 NOTE — ED Triage Notes (Signed)
Pt bib [facility] c/o of altered mental status and hallucinations starting on 1/4. Family tells ems that pt is seeing other people that tell her "they are not real." Hx of copd, chf, dementia. Pt oriented to self, situation, and place on arrival.   CBG: 155 Temp: 98.1  BP: 124/90 HR: 88

## 2020-02-13 NOTE — Discharge Instructions (Addendum)
Take antibiotic for urinary tract infection.  Take potassium pills as well for low potassium.  Please return to the emergency department if symptoms worsen.

## 2020-02-13 NOTE — ED Provider Notes (Signed)
[facility] EMERGENCY DEPARTMENT Provider Note   CSN: [id number] Arrival date & time: 02/13/20  0137     History Chief Complaint  Patient presents with   Altered Mental Status    Caitlin Vasquez is a 75 y.o. female.  HPI     This is a 75 year old female with history of COPD, dementia who presents with hallucinations.  When asked why she came to the hospital she states "I think I am going nuts."  She describes that she is seeing people that are visiting that have not been visiting.  She describes having conversations with her son about "those boys that visited" and her son states that no one visited.  She is awake, alert, oriented x3.  She is able to provide history.  She has had a slight cough.  No recent fevers, chest pain, shortness of breath, abdominal pain, nausea, vomiting.  Denies urinary symptoms.  She is fully vaccinated against COVID-19  Past Medical History:  Diagnosis Date   COPD (chronic obstructive pulmonary disease) (HCC)    High cholesterol    On home oxygen therapy    "2L; all the time" (04/06/2016)   Pneumonia    "when I was a kid"    Urinary hesitancy     Patient Active Problem List   Diagnosis Date Noted   Labile blood glucose    Dyspnea    Urinary incontinence    Incontinence of feces    Chronic obstructive pulmonary disease (HCC)    Elevated BUN    Acute pulmonary edema (HCC) 08/19/2019   Altered mental status 08/15/2019   Hyperglycemia 08/14/2019   Dyslipidemia 08/14/2019   On home oxygen therapy 08/13/2019   Atrial fibrillation (HCC) 08/13/2019   Hypertensive emergency 08/13/2019   E-coli UTI 08/13/2019   Hypokalemia 08/13/2019   Aortic atherosclerosis (HCC) 08/13/2019   Cerebellar hemorrhage (HCC) 08/13/2019   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 - d/t AF not on [facility] 08/10/2019   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/20/2016   Malnutrition of moderate degree 04/11/2016   Acute left systolic heart failure (HCC) 04/06/2016   Exertional dyspnea 11/28/2014   COPD with chronic bronchitis and emphysema (HCC) 11/28/2014   Chest pain at rest 11/28/2014   Essential hypertension 11/28/2014    Past Surgical History:  Procedure Laterality Date   ABDOMINAL AORTOGRAM N/A 04/08/2016   Procedure: Abdominal Aortogram;  Surgeon: Sherren Kerns, MD;  Location: [facility] INVASIVE CV LAB;  Service: Cardiovascular;  Laterality: N/A;   CARDIAC CATHETERIZATION N/A 11/28/2014   Procedure: Right/Left Heart Cath and Coronary Angiography;  Surgeon: [name], MD;  Location: [facility] INVASIVE CV LAB;  Service: Cardiovascular;  Laterality: N/A;   CARDIAC CATHETERIZATION  08/2006   Hattie Perch 08/25/2006   DRESSING CHANGE UNDER ANESTHESIA Left 04/12/2016   Procedure: DRESSING CHANGE LEFT HIP AND LEFT HEEL;  Surgeon: Fransisco Hertz, MD;  Location: [facility] OR;  Service: Vascular;  Laterality: Left;   FEMORAL-POPLITEAL BYPASS GRAFT Left 04/12/2016   Procedure: LEFT FEMORAL-POPITEAL  BYPASS GRAFT;  Surgeon: Fransisco Hertz, MD;  Location: [facility] OR;  Service: Vascular;  Laterality: Left;   JOINT REPLACEMENT     LOWER EXTREMITY ANGIOGRAPHY Bilateral 04/08/2016   Procedure: Lower Extremity Angiography;  Surgeon: Sherren Kerns, MD;  Location: [facility] INVASIVE CV LAB;  Service: Cardiovascular;  Laterality: Bilateral;   PERIPHERAL VASCULAR INTERVENTION Bilateral 04/08/2016   Procedure: Peripheral Vascular  Intervention;  Surgeon: Sherren Kerns, MD;  Location: [facility] INVASIVE CV LAB;  Service: Cardiovascular;  Laterality: Bilateral;  common iliac   TOTAL HIP ARTHROPLASTY Left 01/29/2016   VEIN HARVEST Left 04/12/2016   Procedure: LEFT GREATER SAPHENOUS VEIN HARVEST;  Surgeon: Fransisco Hertz, MD;  Location: [facility] OR;  Service: Vascular;  Laterality: Left;     OB History   No obstetric history on file.     Family History  Problem  Relation Age of Onset   Heart attack Father     Social History   Tobacco Use   Smoking status: Former Smoker    Packs/day: 1.00    Years: 55.00    Pack years: 55.00    Types: Cigarettes    Quit date: 2016    Years since quitting: 6.0   Smokeless tobacco: Never Used  Substance Use Topics   Alcohol use: No   Drug use: No    Home Medications Prior to Admission medications   Medication Sig Start Date End Date Taking? Authorizing Provider  acetaminophen (TYLENOL) 500 MG tablet Take 500 mg by mouth 2 (two) times daily as needed for mild pain.    [provider]  albuterol (VENTOLIN HFA) 108 (90 Base) MCG/ACT inhaler Inhale 2 puffs into the lungs every 6 (six) hours as needed for wheezing or shortness of breath. 09/12/19   Love, Evlyn Kanner, PA-C  amLODipine (NORVASC) 5 MG tablet Take 1 tablet (5 mg total) by mouth daily. 09/12/19   Love, Evlyn Kanner, PA-C  atorvastatin (LIPITOR) 80 MG tablet Take 1 tablet (80 mg total) by mouth daily. 09/12/19   Love, Evlyn Kanner, PA-C  carvedilol (COREG) 12.5 MG tablet Take 1 tablet (12.5 mg total) by mouth 2 (two) times daily with a meal. 09/12/19   Love, Evlyn Kanner, PA-C  lisinopril (ZESTRIL) 10 MG tablet Take 1 tablet (10 mg total) by mouth daily. 09/12/19 12/11/19  Love, Evlyn Kanner, PA-C  megestrol (MEGACE) 400 MG/10ML suspension Take 10 mLs (400 mg total) by mouth 2 (two) times daily. 09/12/19   Love, Evlyn Kanner, PA-C  melatonin 3 MG TABS tablet Take 1 tablet (3 mg total) by mouth at bedtime as needed (insomnia). 09/12/19   Love, Evlyn Kanner, PA-C  mirtazapine (REMERON) 7.5 MG tablet Take 1 tablet (7.5 mg total) by mouth at bedtime. 09/12/19   Love, Evlyn Kanner, PA-C  Skin Protectants, Misc. (WHITE PETROLATUM-ZINC) 58.3 % cream Apply topically 3 (three) times daily as needed. To sacrum 09/12/19   Love, Evlyn Kanner, PA-C  tamsulosin (FLOMAX) 0.4 MG CAPS capsule Take 1 capsule (0.4 mg total) by mouth daily after supper. 09/12/19   Love, Evlyn Kanner, PA-C  Tiotropium Bromide-Olodaterol  (STIOLTO RESPIMAT) 2.5-2.5 MCG/ACT AERS Inhale 2 puffs into the lungs daily. 09/12/19   Love, Evlyn Kanner, PA-C    Allergies    Clonidine derivatives  Review of Systems   Review of Systems  Constitutional: Negative for fever.  Respiratory: Positive for cough. Negative for shortness of breath.   Cardiovascular: Negative for chest pain.  Gastrointestinal: Negative for abdominal pain, nausea and vomiting.  Genitourinary: Negative for dysuria.  Psychiatric/Behavioral: Positive for hallucinations. Negative for behavioral problems, sleep disturbance and suicidal ideas.  All other systems reviewed and are negative.   Physical Exam Updated Vital Signs BP (!) 188/87    Pulse 88    Temp (!) 97.5 F (36.4 C) (Oral)    Resp 18    Ht 1.626 m (5\' 4" )    Wt  54.4 kg    SpO2 100%    BMI 20.60 kg/m   Physical Exam Vitals and nursing note reviewed.  Constitutional:      Appearance: She is well-developed and well-nourished.     Comments: Elderly, nontoxic-appearing  HENT:     Head: Normocephalic and atraumatic.     Nose: Nose normal.     Mouth/Throat:     Mouth: Mucous membranes are moist.  Eyes:     Pupils: Pupils are equal, round, and reactive to light.  Cardiovascular:     Rate and Rhythm: Normal rate and regular rhythm.     Heart sounds: Normal heart sounds.  Pulmonary:     Effort: Pulmonary effort is normal. No respiratory distress.     Breath sounds: No wheezing.  Abdominal:     General: Bowel sounds are normal.     Palpations: Abdomen is soft.     Tenderness: There is no abdominal tenderness. There is no guarding or rebound.  Musculoskeletal:     Cervical back: Neck supple.     Right lower leg: No edema.     Left lower leg: No edema.  Skin:    General: Skin is warm and dry.  Neurological:     Mental Status: She is alert and oriented to person, place, and time.  Psychiatric:        Mood and Affect: Mood and affect and mood normal.     ED Results / Procedures / Treatments    Labs (all labs ordered are listed, but only abnormal results are displayed) Labs Reviewed  RESP PANEL BY RT-PCR (FLU A&B, COVID) [id number]  CBC WITH DIFFERENTIAL/PLATELET  BASIC METABOLIC PANEL  URINALYSIS, ROUTINE W REFLEX MICROSCOPIC    EKG None  Radiology DG Chest Portable 1 View  Result Date: 02/13/2020 CLINICAL DATA:  Initial evaluation for acute cough. EXAM: PORTABLE CHEST 1 VIEW COMPARISON:  Prior radiograph from 01/02/2020. FINDINGS: Transverse heart size within normal limits. Mediastinal silhouette normal. Aortic atherosclerosis. Lungs hypoinflated. There is question of hazy opacities at the right lung base, suspicious for possible mild and/or developing infiltrate given the provided history of cough. No other definite focal airspace disease. No edema or effusion. No pneumothorax. No acute osseous finding.  Osteopenia noted. IMPRESSION: 1. Question subtle hazy opacities at the right lung base, suspicious for possible mild and/or early developing infiltrate given the history of cough. 2.  Aortic Atherosclerosis (ICD10-I70.0). Electronically Signed   By: Jeannine Boga M.D.   On: 02/13/2020 02:14    Procedures Procedures (including critical care time)  Medications Ordered in ED Medications - No data to display  ED Course  I have reviewed the triage vital signs and the nursing notes.  Pertinent labs & imaging results that were available during my care of the patient were reviewed by me and considered in my medical decision making (see chart for details).    MDM Rules/Calculators/A&P                          Patient presents with altered mental status.  She is technically awake, alert, oriented.  She is aware that she is altered.  She is nontoxic-appearing and vital signs are reassuring.  She is nonfocal on exam.  Doubt stroke.  Urinalysis is concerning for possible UTI.  Additionally lab work notable for low potassium.  This was replaced with IV potassium.  She is also  noted to be hypertensive.  She has not been taking her  blood pressure medications.  No evidence of hypertensive urgency or emergency.  Patient was given her home blood pressure medications.  After potassium infusion, feel that she can be discharged home with antibiotics to cover for UTI.  She also has a possible developing infiltrate on her x-ray.  Will cover with Keflex.  After history, exam, and medical workup I feel the patient has been appropriately medically screened and is safe for discharge home. Pertinent diagnoses were discussed with the patient. Patient was given return precautions.  Final Clinical Impression(s) / ED Diagnoses Final diagnoses:  Acute cystitis without hematuria  Hypokalemia  Primary hypertension    Rx / DC Orders ED Discharge Orders    None       Shon Baton, MD 02/20/20 [phone]

## 2020-02-15 LAB — URINE CULTURE: Culture: >=100000 — AB

## 2020-02-16 ENCOUNTER — Telehealth: Payer: Self-pay | Admitting: Emergency Medicine

## 2020-02-16 NOTE — Telephone Encounter (Signed)
Post ED Visit - Positive Culture Follow-up: Unsuccessful Patient Follow-up  Culture assessed and recommendations reviewed by:  []  , Pharm.D. []  Enzo Bi, Pharm.D., BCPS AQ-ID []  , Pharm.D., BCPS []  Celedonio Miyamoto, .D., BCPS []  [place], .D., BCPS, AAHIVP []  Georgina Pillion, Pharm.D., BCPS, AAHIVP [x]  [address], PharmD []  , PharmD, BCPS  Positive urine culture  []  Patient discharged without antimicrobial prescription and treatment is now indicated [x]  Organism is resistant to prescribed ED discharge antimicrobial []  Patient with positive blood cultures   Unable to contact patient or patient's son on home and mobile numbers on file, letter will be sent to address on file  Plan:  D/C Keflex Start Amoxil 500 mg every eight hours x five days. [place]   [address] C Betsi Crespi 02/16/2020, 4:39 PM

## 2020-02-16 NOTE — Progress Notes (Signed)
ED Antimicrobial Stewardship Positive Culture Follow Up   Caitlin Vasquez is an 75 y.o. female who presented to [facility] on 02/13/2020 with a chief complaint of AMS Chief Complaint  Patient presents with  . Altered Mental Status    Recent Results (from the past 720 hour(s))  Resp Panel by RT-PCR (Flu A&B, Covid) Nasopharyngeal Swab     Status: None   Collection Time: 02/13/20  2:18 AM   Specimen: Nasopharyngeal Swab; Nasopharyngeal(NP) swabs in vial transport medium  Result Value Ref Range Status   SARS Coronavirus 2 by RT PCR NEGATIVE NEGATIVE Final    Comment: (NOTE) SARS-CoV-2 target nucleic acids are NOT DETECTED.  The SARS-CoV-2 RNA is generally detectable in upper respiratory specimens during the acute phase of infection. The lowest concentration of SARS-CoV-2 viral copies this assay can detect is 138 copies/mL. A negative result does not preclude SARS-Cov-2 infection and should not be used as the sole basis for treatment or other patient management decisions. A negative result may occur with  improper specimen collection/handling, submission of specimen other than nasopharyngeal swab, presence of viral mutation(s) within the areas targeted by this assay, and inadequate number of viral copies(<138 copies/mL). A negative result must be combined with clinical observations, patient history, and epidemiological information. The expected result is Negative.  Fact Sheet for Patients:  BloggerCourse.com  Fact Sheet for Healthcare Providers:  SeriousBroker.it  This test is no t yet approved or cleared by the [place] FDA and  has been authorized for detection and/or diagnosis of SARS-CoV-2 by FDA under an Emergency Use Authorization (EUA). This EUA will remain  in effect (meaning this test can be used) for the duration of the COVID-19 declaration under Section 564(b)(1) of the Act, 21 U.S.C.section 360bbb-3(b)(1), unless the  authorization is terminated  or revoked sooner.       Influenza A by PCR NEGATIVE NEGATIVE Final   Influenza B by PCR NEGATIVE NEGATIVE Final    Comment: (NOTE) The Xpert Xpress SARS-CoV-2/FLU/RSV plus assay is intended as an aid in the diagnosis of influenza from Nasopharyngeal swab specimens and should not be used as a sole basis for treatment. Nasal washings and aspirates are unacceptable for Xpert Xpress SARS-CoV-2/FLU/RSV testing.  Fact Sheet for Patients: BloggerCourse.com  Fact Sheet for Healthcare Providers: SeriousBroker.it  This test is not yet approved or cleared by the [place] FDA and has been authorized for detection and/or diagnosis of SARS-CoV-2 by FDA under an Emergency Use Authorization (EUA). This EUA will remain in effect (meaning this test can be used) for the duration of the COVID-19 declaration under Section 564(b)(1) of the Act, 21 U.S.C. section 360bbb-3(b)(1), unless the authorization is terminated or revoked.  Performed at [facility], [address] [place], [place] [address]   Urine culture     Status: Abnormal   Collection Time: 02/13/20  5:15 AM   Specimen: Urine, Random  Result Value Ref Range Status   Specimen Description URINE, RANDOM  Final   Special Requests   Final    NONE Performed at [facility], [address], [place], [place] [address]    Culture >=100,000 COLONIES/mL ENTEROCOCCUS FAECALIS (A)  Final   Report Status 02/15/2020 FINAL  Final   Organism ID, Bacteria ENTEROCOCCUS FAECALIS (A)  Final      Susceptibility   Enterococcus faecalis - MIC*    AMPICILLIN <=2 SENSITIVE Sensitive     NITROFURANTOIN <=16 SENSITIVE Sensitive     VANCOMYCIN 1 SENSITIVE  Sensitive     * >=100,000 COLONIES/mL ENTEROCOCCUS FAECALIS    [x]  Treated with Keflex, organism resistant to prescribed antimicrobial  New antibiotic prescription: Amoxil 500mg  q8h x 5d  ED Provider:  , PA-C   02/16/2020, 1:13 PM Clinical Pharmacist Monday - Friday phone -  [phone] Saturday - Sunday phone - [phone]

## 2020-04-07 DEATH — deceased

## 2021-09-30 IMAGING — CT CT ANGIO HEAD
2 of 7 series · 8 of 33 positions shown · IV contrast (OMNI 350)
Comparison: Brain MRI, head CT, and CTA head and neck 08/10/2019.

CLINICAL DATA: 73-year-old female code stroke presentation
yesterday with acute left cerebellar hemorrhage. Ataxia. And MRI
reveals superimposed acute right PCA territory infarct. Motion
degraded CTA head and neck at [facility] yesterday.

EXAM:
CT ANGIOGRAPHY HEAD AND NECK
TECHNIQUE: Multidetector CT imaging of the head and neck was performed using
the standard protocol during bolus administration of intravenous
contrast. Multiplanar CT image reconstructions and MIPs were
obtained to evaluate the vascular anatomy. Carotid stenosis
measurements (when applicable) are obtained utilizing NASCET
criteria, using the distal internal carotid diameter as the
denominator.
CONTRAST:  50mL OMNIPAQUE IOHEXOL 350 MG/ML SOLN

[Series 5: cta neck · axial · 0.53mm/px · z∈[-204,-96]mm · 2 of 162 slices shown]
[im 54/162  soft-tissue]
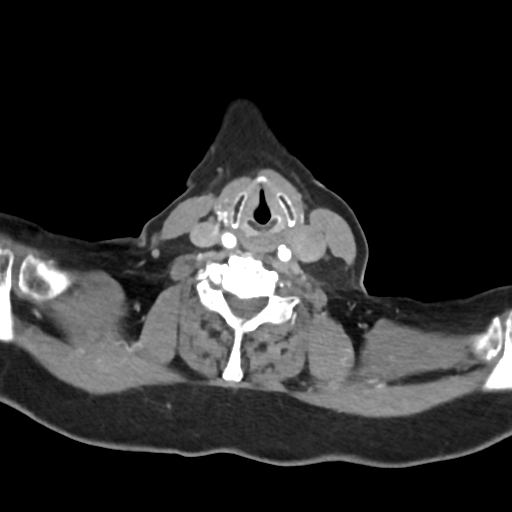
[im 108/162  soft-tissue]
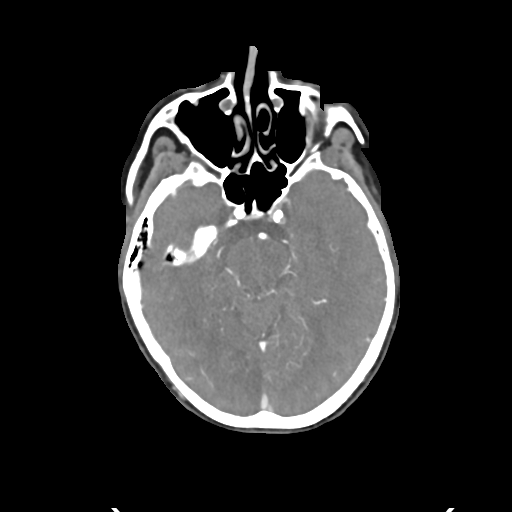

[Series 7: cta neck axial · axial · 0.44mm/px · z∈[-264,-36]mm · 6 of 320 slices shown]
[im 46/320  soft-tissue]
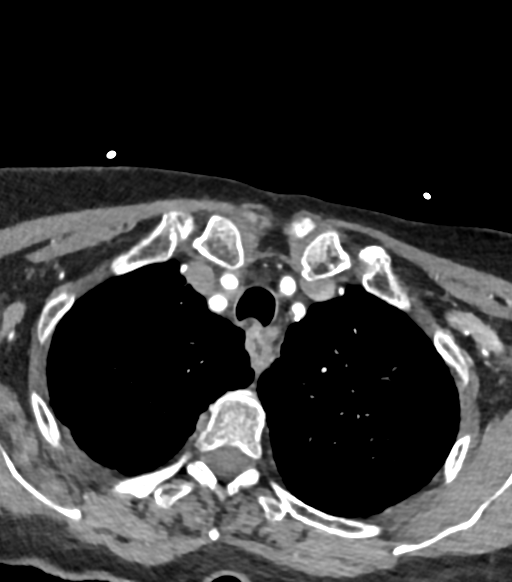
[im 92/320  bone]
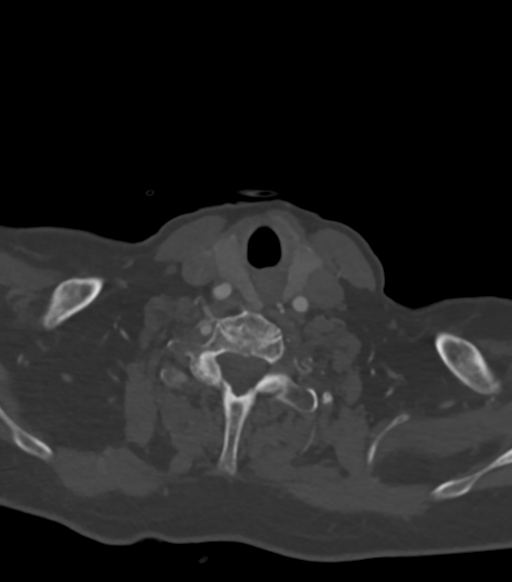
[im 137/320  soft-tissue]
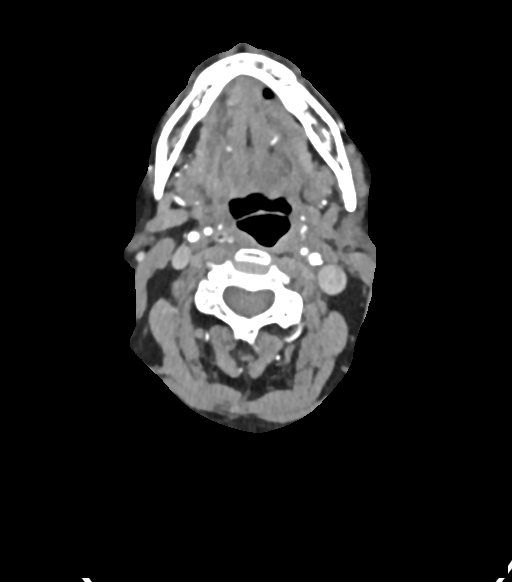
[im 183/320  bone]
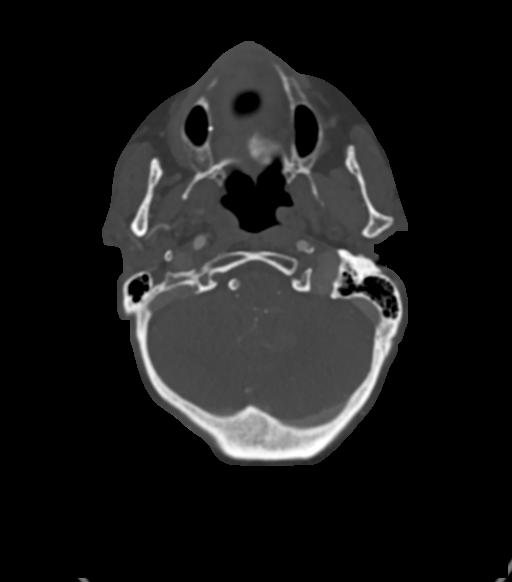
[im 228/320  soft-tissue]
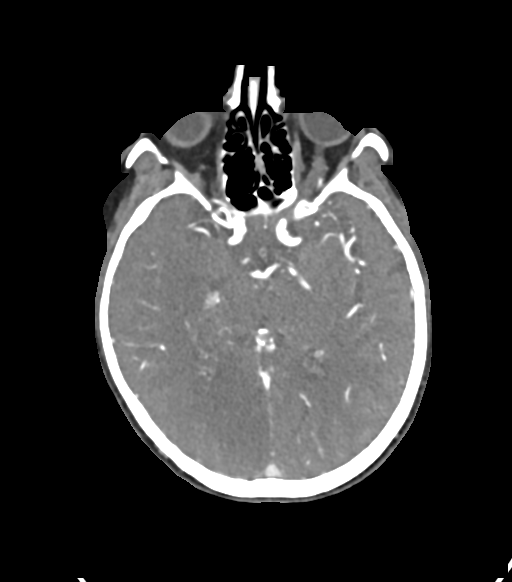
[im 274/320  bone]
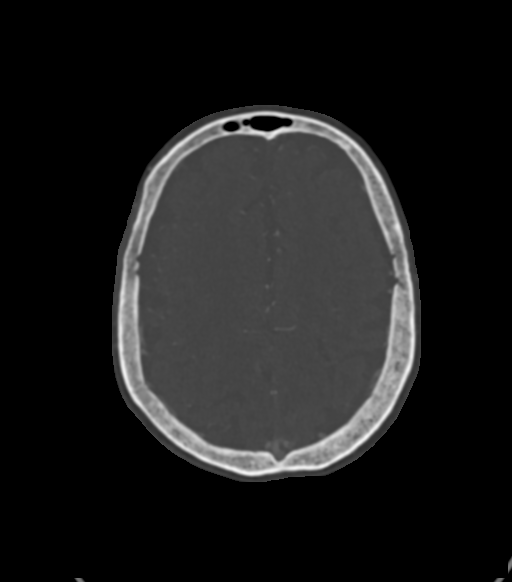

[8 of 33 positions shown; findings below may reference images not displayed]

FINDINGS: CTA NECK

Skeleton: Most of the dentition is absent. Multilevel cervical spine
degeneration with some levels of posterior element ankylosis. Mild
T5 compression fracture appears nonacute. No acute osseous
abnormality identified.

Upper chest: Centrilobular emphysema throughout the visible lungs.
No superior mediastinal lymphadenopathy.

Other neck: Negative.

Aortic arch: Extensive Calcified aortic atherosclerosis. Including a
small area of ulcerated plaque at the distal arch on series 5, image
155, stable. Three vessel arch configuration.

Right carotid system: Brachiocephalic and right CCA soft and
calcified plaque without stenosis. Calcified plaque at the right ICA
origin and bulb without stenosis. However, there is a focal 65 %
stenosis just distal to the bulb best seen on series 8, image 92
which appears related to either soft plaque or perhaps a carotid
web. The right ICA remains patent to the skull base.

Left carotid system: Abundant soft and calcified plaque at the
proximal left CCA with less than 50% stenosis. Complex calcified
plaque at the left ICA origin, and at the distal bulb a short
segment high-grade radiographic string sign stenosis occurs as seen
on series 9, image 95. The left ICA remains patent to the skull
base.

Vertebral arteries:
Proximal right subclavian artery plaque without stenosis. Dominant
right vertebral artery with calcified plaque near the origin but no
stenosis. Mild right V1 through V3 segment calcified plaque and
tortuosity but no significant stenosis to the skull base.

Proximal left subclavian artery soft and calcified plaque without
significant stenosis. Non dominant left vertebral artery with severe
origin stenosis (series 8, image 125) and tandem severe stenosis in
the proximal V2 segment. Additional severe left V3 segment stenosis
(series 8, image 118). The vessel is diminutive and functionally
terminates or is occluded at the dura.

CTA HEAD

Posterior circulation: Right vertebral artery supplies the basilar
with V4 segment calcification resulting in up to moderate stenosis
on series 7, image 138, which is proximal to the patent right PICA
origin. Patent vertebrobasilar junction with probable retrograde
supply to the left PICA which remains patent.

Area of left cerebellar intra-axial hemorrhage on series 7, image
138. No CTA spot sign. No abnormal vascularity.

Patent basilar artery without stenosis. Probable infundibulum rather
than 3 mm saccular aneurysm at the left basilar tip, origin of the
left SCA. Right SCA and left PCA origins are within normal limits.
Posterior communicating arteries are diminutive or absent.

However, just past the right PCA origin there is severe right PCA
stenosis, and a diminutive appearance of the remainder of the
vessel. No right PCA occlusion. Left PCA branches are mildly
irregular.

Anterior circulation: Both ICA siphons are patent but heavily
calcified. On the left there is mild stenosis, but a superimposed
3-4 mm saccular aneurysm directed inferiorly from the distal left
ICA best seen on series 9, image 84.

On the right there is mild to moderate siphon stenosis in the
cavernous and supraclinoid segments.

Patent carotid termini. Patent MCA and ACA origins. Moderate
stenosis of the right ACA A1 segment (series 11, image 19). Anterior
communicating artery within normal limits. Median artery of the
corpus callosum (normal variant). Mild bilateral distal ACA branch
irregularity (series 12, image 16).

Left MCA M1 segment and bifurcation are patent, but there is a
severe stenosis at the origin of the superior left M2 division
(series 9, image 102 and series 11, image 119). However, no right
MCA branch occlusion is identified. Left MCA M1 segment bifurcates
early without stenosis. Right MCA branches are mildly irregular.

Venous sinuses: Patent.

Anatomic variants: Dominant right vertebral artery which supplies
the basilar.

Review of the MIP images confirms the above findings
IMPRESSION: 1. Negative for CTA spot sign or abnormal vascularity in the region
of the left cerebellar parenchymal hemorrhage.

2. Positive for a distal Left ICA aneurysm measuring 3-4 mm, this is
in the posterior communicating artery region although both posterior
communicating arteries appear diminutive or absent.
Additionally, an infundibulum rather than small 2-3 mm aneurysm is
suspected at the left SCA origin.

3. Positive also for severe atherosclerosis throughout the head and
neck.
Numerous hemodynamically significant stenoses including:
- tandem Severe stenoses of the non-dominant Left Vertebral Artery
which is functionally occluded at the skull base.
- high-grade stenosis at the Left ICA distal bulb resulting in a
short segment radiographic string sign stenosis.
- 65% stenosis at the Right ICA distal bulb related to either soft
plaque versus carotid web.
- mild to moderate bilateral ICA siphon stenosis greater on the
right.

- Severe stenosis of the proximal Right PCA which is diminutive but
does not occlude.
- moderate stenosis of the dominant Right Vertebral Artery V4
segment which supplies the Basilar.
- moderate stenosis Right ACA A1 segment.
- Severe stenosis Right MCA M2 superior division origin.

4. Aortic Atherosclerosis ([id number]) and Emphysema ([id number]).
